# Patient Record
Sex: Female | Born: 1980
Health system: Southern US, Community
[De-identification: ages and names within clinical notes are randomized; demographics above are authoritative.]

## PROBLEM LIST (undated history)

## (undated) DIAGNOSIS — A048 Other specified bacterial intestinal infections: Secondary | ICD-10-CM

## (undated) DIAGNOSIS — Z915 Personal history of self-harm: Secondary | ICD-10-CM

## (undated) DIAGNOSIS — Z8719 Personal history of other diseases of the digestive system: Secondary | ICD-10-CM

## (undated) DIAGNOSIS — R87810 Cervical high risk human papillomavirus (HPV) DNA test positive: Secondary | ICD-10-CM

## (undated) DIAGNOSIS — K2950 Unspecified chronic gastritis without bleeding: Secondary | ICD-10-CM

## (undated) DIAGNOSIS — G4726 Circadian rhythm sleep disorder, shift work type: Secondary | ICD-10-CM

## (undated) DIAGNOSIS — D649 Anemia, unspecified: Secondary | ICD-10-CM

## (undated) DIAGNOSIS — F32A Depression, unspecified: Secondary | ICD-10-CM

## (undated) DIAGNOSIS — Z87442 Personal history of urinary calculi: Secondary | ICD-10-CM

## (undated) DIAGNOSIS — D6859 Other primary thrombophilia: Secondary | ICD-10-CM

## (undated) DIAGNOSIS — F419 Anxiety disorder, unspecified: Secondary | ICD-10-CM

## (undated) DIAGNOSIS — Z8041 Family history of malignant neoplasm of ovary: Secondary | ICD-10-CM

## (undated) DIAGNOSIS — R21 Rash and other nonspecific skin eruption: Secondary | ICD-10-CM

## (undated) DIAGNOSIS — R61 Generalized hyperhidrosis: Secondary | ICD-10-CM

## (undated) DIAGNOSIS — I82409 Acute embolism and thrombosis of unspecified deep veins of unspecified lower extremity: Secondary | ICD-10-CM

## (undated) DIAGNOSIS — G43909 Migraine, unspecified, not intractable, without status migrainosus: Secondary | ICD-10-CM

## (undated) DIAGNOSIS — F329 Major depressive disorder, single episode, unspecified: Secondary | ICD-10-CM

## (undated) DIAGNOSIS — M1990 Unspecified osteoarthritis, unspecified site: Secondary | ICD-10-CM

## (undated) DIAGNOSIS — K219 Gastro-esophageal reflux disease without esophagitis: Secondary | ICD-10-CM

## (undated) DIAGNOSIS — Z9151 Personal history of suicidal behavior: Secondary | ICD-10-CM

## (undated) HISTORY — DX: Migraine, unspecified, not intractable, without status migrainosus: G43.909

## (undated) HISTORY — PX: ABDOMINAL HYSTERECTOMY: SHX81

## (undated) HISTORY — DX: Family history of malignant neoplasm of ovary: Z80.41

## (undated) HISTORY — PX: OTHER SURGICAL HISTORY: SHX169

## (undated) HISTORY — DX: Generalized hyperhidrosis: R61

## (undated) HISTORY — DX: Major depressive disorder, single episode, unspecified: F32.9

## (undated) HISTORY — DX: Depression, unspecified: F32.A

## (undated) HISTORY — PX: TUBAL LIGATION: SHX77

## (undated) HISTORY — DX: Unspecified chronic gastritis without bleeding: K29.50

## (undated) HISTORY — DX: Personal history of other diseases of the digestive system: Z87.19

## (undated) HISTORY — DX: Anxiety disorder, unspecified: F41.9

## (undated) HISTORY — DX: Other specified bacterial intestinal infections: A04.8

## (undated) HISTORY — PX: CHOLECYSTECTOMY: SHX55

## (undated) HISTORY — DX: Circadian rhythm sleep disorder, shift work type: G47.26

## (undated) HISTORY — DX: Cervical high risk human papillomavirus (HPV) DNA test positive: R87.810

## (undated) HISTORY — DX: Personal history of self-harm: Z91.5

## (undated) HISTORY — DX: Rash and other nonspecific skin eruption: R21

## (undated) HISTORY — DX: Unspecified osteoarthritis, unspecified site: M19.90

## (undated) HISTORY — DX: Personal history of suicidal behavior: Z91.51

## (undated) HISTORY — PX: ANTERIOR CRUCIATE LIGAMENT REPAIR: SHX115

---
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
## 1999-03-16 ENCOUNTER — Encounter: Payer: Self-pay | Admitting: Emergency Medicine

## 1999-03-16 ENCOUNTER — Emergency Department (HOSPITAL_COMMUNITY): Admission: EM | Admit: 1999-03-16 | Discharge: 1999-03-16 | Payer: Self-pay | Admitting: Emergency Medicine

## 1999-05-21 ENCOUNTER — Encounter: Payer: Self-pay | Admitting: Emergency Medicine

## 1999-05-21 ENCOUNTER — Emergency Department (HOSPITAL_COMMUNITY): Admission: EM | Admit: 1999-05-21 | Discharge: 1999-05-21 | Payer: Self-pay | Admitting: Emergency Medicine

## 1999-06-02 ENCOUNTER — Ambulatory Visit (HOSPITAL_COMMUNITY): Admission: RE | Admit: 1999-06-02 | Discharge: 1999-06-02 | Payer: Self-pay

## 2000-01-08 ENCOUNTER — Encounter: Payer: Self-pay | Admitting: Emergency Medicine

## 2000-01-08 ENCOUNTER — Emergency Department (HOSPITAL_COMMUNITY): Admission: EM | Admit: 2000-01-08 | Discharge: 2000-01-08 | Payer: Self-pay | Admitting: Emergency Medicine

## 2000-06-27 ENCOUNTER — Emergency Department (HOSPITAL_COMMUNITY): Admission: EM | Admit: 2000-06-27 | Discharge: 2000-06-27 | Payer: Self-pay | Admitting: Emergency Medicine

## 2000-07-24 ENCOUNTER — Encounter: Admission: RE | Admit: 2000-07-24 | Discharge: 2000-07-24 | Payer: Self-pay | Admitting: Obstetrics & Gynecology

## 2000-07-30 ENCOUNTER — Encounter: Payer: Self-pay | Admitting: Emergency Medicine

## 2000-07-30 ENCOUNTER — Emergency Department (HOSPITAL_COMMUNITY): Admission: EM | Admit: 2000-07-30 | Discharge: 2000-07-30 | Payer: Self-pay | Admitting: Emergency Medicine

## 2000-08-02 ENCOUNTER — Encounter: Admission: RE | Admit: 2000-08-02 | Discharge: 2000-08-02 | Payer: Self-pay | Admitting: Obstetrics

## 2000-08-23 ENCOUNTER — Encounter: Admission: RE | Admit: 2000-08-23 | Discharge: 2000-08-23 | Payer: Self-pay | Admitting: Obstetrics

## 2000-09-06 ENCOUNTER — Encounter: Admission: RE | Admit: 2000-09-06 | Discharge: 2000-09-06 | Payer: Self-pay | Admitting: Obstetrics

## 2000-09-20 ENCOUNTER — Encounter: Admission: RE | Admit: 2000-09-20 | Discharge: 2000-09-20 | Payer: Self-pay | Admitting: Obstetrics

## 2000-10-04 ENCOUNTER — Encounter: Admission: RE | Admit: 2000-10-04 | Discharge: 2000-10-04 | Payer: Self-pay | Admitting: Obstetrics

## 2000-10-18 ENCOUNTER — Encounter: Admission: RE | Admit: 2000-10-18 | Discharge: 2000-10-18 | Payer: Self-pay | Admitting: Obstetrics

## 2000-12-08 ENCOUNTER — Emergency Department (HOSPITAL_COMMUNITY): Admission: EM | Admit: 2000-12-08 | Discharge: 2000-12-08 | Payer: Self-pay | Admitting: Emergency Medicine

## 2000-12-08 ENCOUNTER — Encounter: Payer: Self-pay | Admitting: Emergency Medicine

## 2001-07-22 ENCOUNTER — Emergency Department (HOSPITAL_COMMUNITY): Admission: EM | Admit: 2001-07-22 | Discharge: 2001-07-22 | Payer: Self-pay

## 2001-11-21 ENCOUNTER — Encounter: Payer: Self-pay | Admitting: Emergency Medicine

## 2001-11-21 ENCOUNTER — Emergency Department (HOSPITAL_COMMUNITY): Admission: EM | Admit: 2001-11-21 | Discharge: 2001-11-21 | Payer: Self-pay | Admitting: Emergency Medicine

## 2001-12-17 ENCOUNTER — Other Ambulatory Visit: Admission: RE | Admit: 2001-12-17 | Discharge: 2001-12-17 | Payer: Self-pay | Admitting: Obstetrics & Gynecology

## 2002-01-10 ENCOUNTER — Emergency Department (HOSPITAL_COMMUNITY): Admission: EM | Admit: 2002-01-10 | Discharge: 2002-01-10 | Payer: Self-pay | Admitting: Emergency Medicine

## 2002-02-17 ENCOUNTER — Emergency Department (HOSPITAL_COMMUNITY): Admission: EM | Admit: 2002-02-17 | Discharge: 2002-02-17 | Payer: Self-pay | Admitting: *Deleted

## 2002-03-26 ENCOUNTER — Ambulatory Visit (HOSPITAL_COMMUNITY): Admission: RE | Admit: 2002-03-26 | Discharge: 2002-03-26 | Payer: Self-pay | Admitting: Obstetrics & Gynecology

## 2002-03-26 ENCOUNTER — Encounter: Payer: Self-pay | Admitting: Obstetrics & Gynecology

## 2002-05-05 ENCOUNTER — Inpatient Hospital Stay (HOSPITAL_COMMUNITY): Admission: AD | Admit: 2002-05-05 | Discharge: 2002-05-08 | Payer: Self-pay | Admitting: Obstetrics & Gynecology

## 2002-05-06 ENCOUNTER — Encounter: Payer: Self-pay | Admitting: Obstetrics & Gynecology

## 2002-05-07 ENCOUNTER — Encounter: Payer: Self-pay | Admitting: Obstetrics & Gynecology

## 2002-07-21 ENCOUNTER — Ambulatory Visit (HOSPITAL_COMMUNITY): Admission: RE | Admit: 2002-07-21 | Discharge: 2002-07-21 | Payer: Self-pay | Admitting: Obstetrics

## 2002-07-21 ENCOUNTER — Encounter: Payer: Self-pay | Admitting: Obstetrics

## 2002-08-21 ENCOUNTER — Ambulatory Visit (HOSPITAL_COMMUNITY): Admission: RE | Admit: 2002-08-21 | Discharge: 2002-08-21 | Payer: Self-pay | Admitting: Obstetrics

## 2002-08-21 ENCOUNTER — Encounter: Payer: Self-pay | Admitting: Obstetrics

## 2002-08-23 ENCOUNTER — Inpatient Hospital Stay (HOSPITAL_COMMUNITY): Admission: AD | Admit: 2002-08-23 | Discharge: 2002-08-23 | Payer: Self-pay | Admitting: Obstetrics

## 2002-08-25 ENCOUNTER — Inpatient Hospital Stay (HOSPITAL_COMMUNITY): Admission: AD | Admit: 2002-08-25 | Discharge: 2002-08-27 | Payer: Self-pay | Admitting: Obstetrics

## 2003-04-06 ENCOUNTER — Inpatient Hospital Stay (HOSPITAL_COMMUNITY): Admission: EM | Admit: 2003-04-06 | Discharge: 2003-04-08 | Payer: Self-pay | Admitting: Psychiatry

## 2003-07-24 ENCOUNTER — Inpatient Hospital Stay (HOSPITAL_COMMUNITY): Admission: AD | Admit: 2003-07-24 | Discharge: 2003-07-24 | Payer: Self-pay | Admitting: *Deleted

## 2004-08-05 ENCOUNTER — Encounter: Payer: Self-pay | Admitting: *Deleted

## 2004-08-05 ENCOUNTER — Emergency Department (HOSPITAL_COMMUNITY): Admission: EM | Admit: 2004-08-05 | Discharge: 2004-08-06 | Payer: Self-pay | Admitting: Emergency Medicine

## 2004-08-09 ENCOUNTER — Emergency Department (HOSPITAL_COMMUNITY): Admission: EM | Admit: 2004-08-09 | Discharge: 2004-08-09 | Payer: Self-pay | Admitting: Family Medicine

## 2004-08-12 ENCOUNTER — Emergency Department (HOSPITAL_COMMUNITY): Admission: EM | Admit: 2004-08-12 | Discharge: 2004-08-12 | Payer: Self-pay | Admitting: Family Medicine

## 2004-09-08 ENCOUNTER — Inpatient Hospital Stay (HOSPITAL_COMMUNITY): Admission: AD | Admit: 2004-09-08 | Discharge: 2004-09-08 | Payer: Self-pay | Admitting: Obstetrics & Gynecology

## 2006-07-12 ENCOUNTER — Emergency Department (HOSPITAL_COMMUNITY): Admission: EM | Admit: 2006-07-12 | Discharge: 2006-07-12 | Payer: Self-pay | Admitting: Emergency Medicine

## 2006-10-19 ENCOUNTER — Ambulatory Visit (HOSPITAL_COMMUNITY): Admission: RE | Admit: 2006-10-19 | Discharge: 2006-10-19 | Payer: Self-pay | Admitting: Obstetrics

## 2006-11-09 ENCOUNTER — Inpatient Hospital Stay (HOSPITAL_COMMUNITY): Admission: AD | Admit: 2006-11-09 | Discharge: 2006-11-09 | Payer: Self-pay | Admitting: Obstetrics & Gynecology

## 2007-01-30 ENCOUNTER — Ambulatory Visit (HOSPITAL_COMMUNITY): Admission: RE | Admit: 2007-01-30 | Discharge: 2007-01-30 | Payer: Self-pay | Admitting: Obstetrics

## 2007-02-12 ENCOUNTER — Ambulatory Visit (HOSPITAL_COMMUNITY): Admission: RE | Admit: 2007-02-12 | Discharge: 2007-02-12 | Payer: Self-pay | Admitting: Obstetrics

## 2007-04-09 ENCOUNTER — Inpatient Hospital Stay (HOSPITAL_COMMUNITY): Admission: AD | Admit: 2007-04-09 | Discharge: 2007-04-09 | Payer: Self-pay | Admitting: Obstetrics

## 2007-06-28 ENCOUNTER — Inpatient Hospital Stay (HOSPITAL_COMMUNITY): Admission: AD | Admit: 2007-06-28 | Discharge: 2007-06-28 | Payer: Self-pay | Admitting: Obstetrics

## 2007-07-01 ENCOUNTER — Inpatient Hospital Stay (HOSPITAL_COMMUNITY): Admission: RE | Admit: 2007-07-01 | Discharge: 2007-07-04 | Payer: Self-pay | Admitting: Obstetrics

## 2007-07-02 ENCOUNTER — Encounter: Payer: Self-pay | Admitting: Obstetrics

## 2010-05-25 ENCOUNTER — Emergency Department (HOSPITAL_COMMUNITY)
Admission: EM | Admit: 2010-05-25 | Discharge: 2010-05-25 | Payer: Self-pay | Source: Home / Self Care | Admitting: Family Medicine

## 2010-09-27 NOTE — Op Note (Signed)
NAMEHISAKO, Tammy Garza               ACCOUNT NO.:  [id number]   MEDICAL RECORD NO.:  [id number]          PATIENT TYPE:  INP   LOCATION:  9144                          FACILITY:  [facility]   PHYSICIAN:  Charles A. Clearance Coots, M.D.DATE OF BIRTH:  08-Mar-1981   DATE OF PROCEDURE:  07/02/2007  DATE OF DISCHARGE:                               OPERATIVE REPORT   PREOPERATIVE DIAGNOSIS:  Elective sterilization.   POSTOPERATIVE DIAGNOSIS:  Elective sterilization.   PROCEDURE:  Bilateral partial salpingectomy.   SURGEON:  Brock Bad, MD   ANESTHESIA:  Epidural.   ESTIMATED BLOOD LOSS:  Negligible.   COMPLICATIONS:  None.   SPECIMEN:  Approximately 2 cm segments of right and left fallopian  tubes.   DISPOSITION OF SPECIMEN:  Pathology.   PROCEDURE IN DETAIL:  The patient was brought to the operating room and  after satisfactory redosing of the epidural the abdomen was prepped and  draped in the usual sterile fashion.  A small inferior umbilical  incision was made with a scalpel that was deepened down to the fascia  with curved Mayo scissors bluntly.  The fascia was grasped in the  midline with Kocher forceps and was cut transversely with curved Mayo  scissors.  The fascial incision was extended to the left and to the  right with the curved Mayo scissors.  The peritoneum was bluntly entered  with hemostats.  Right-angle retractors were placed in the incision.  The right fallopian tube was identified and was grasped with a Babcock  clamp.  The tube was followed from the cornual end to the fimbrial end  serially and grasped with a Babcock clamp.  The tube was then regrasped  in the isthmic area of the tube with the Babcock clamp and a knuckle of  tube beneath the Babcock clamp was suture ligated through the  mesosalpinx with zero plain catgut.  The section of tube above the knot  was excised with Metzenbaum scissors and submitted to pathology for  evaluation.  There was no active bleeding  from the tubal stumps and it  was therefore placed back in the normal anatomic position.  The same  procedure was performed on the opposite side without complications.  The  abdomen was then closed as follows.  The fascia and peritoneum was  closed as one with a continuous suture of 2-0 Vicryl.  The skin was closed with continuous subcuticular suture of 3-0 Monocryl.  Sterile bandage was applied to the incision closure.  Surgical  technician indicated that all needle, sponge and instrument counts were  correct x2.  The patient tolerated the procedure well, was transported  to the recovery room in satisfactory condition.      Charles A. Clearance Coots, M.D.  Electronically Signed     CAH/MEDQ  D:  07/02/2007  T:  07/03/2007  Job:  [id number]

## 2010-09-30 NOTE — Discharge Summary (Signed)
Tammy Garza, AN                           ACCOUNT NO.:  [id number]   MEDICAL RECORD NO.:  [id number]                    PATIENT TYPE:   LOCATION:                                       FACILITY:   PHYSICIAN:  Charles A. Clearance Coots, M.D.             DATE OF BIRTH:   DATE OF ADMISSION:  DATE OF DISCHARGE:                                 DISCHARGE SUMMARY   ADMISSION DIAGNOSES:  1. Second trimester pregnancy with flank pain and hematuria.  2. Probable kidney stone.   DISCHARGE DIAGNOSES:  1. Second trimester pregnancy with flank pain and hematuria.  2. Probable kidney stone.  3. Improved after IV hydration and supportive care.   REASON FOR ADMISSION:  The patient is a 30 year old black female with EDC of  August 21, 2001 who presented with a two day history of right flank pain and  blood in the urine. She presented to triage with both symptoms. She denied  fever, chills, cramping, nausea, vomiting, diarrhea, constipation. Prenatal  care had been without complications thus far.   PAST MEDICAL HISTORY:  Please see chart for full historical data.   PHYSICAL EXAMINATION:  GENERAL: A well developed, well nourished  black  female in no acute distress.  VITAL SIGNS: Afebrile. Vitals stable.  HEENT: Examination within normal limits.  LUNGS: Clear to auscultation and percussion bilaterally.  HEART: Regular rate and rhythm.  No murmur, rub, or gallop.  ABDOMEN: Soft, non-tender.  BACK: Positive right cardiovascular tenderness.  PELVIC: Pelvis was fingertip and long.   LABORATORY DATA:  UA revealed remarkable findings of calcium oxylate  crystals, blood, and hemoglobin in the urine.   DIAGNOSTIC IMPRESSION:  External fetal monitoring revealed mild variable  decelerations with uterine irritability.   HOSPITAL COURSE:  The patient was admitted and started on IV hydration,  analgesic therapy, and IV Ampicillin. Renal ultrasound was done on hospital  day two and revealed no urolithiasis  nor nephrolithiasis. The patient  continued to do well on IV hydration, analgesic therapy, and IV antibiotic  therapy and by hospital day two, was having less pain and by hospital day  three, was asymptomatic.   DISPOSITION:  She was therefore discharged to home on hospital day three at  24 weeks approximate gestation, much improved, and undelivered.   LABORATORY DATA:  On discharge WBC count was 9,400.   DISCHARGE MEDICATIONS:  1. Continue prenatal vitamins.  2. Hemocyte Plus one tab by mouth daily for anemia.   SPECIAL INSTRUCTIONS:  Routine instructions by booklet were given for OB  instructions.   FOLLOW UP:  The patient is to call the office at [facility] for  follow-up appointment.  Charles A. Clearance Coots, M.D.    CAH/MEDQ  D:  05/08/2002  T:  05/09/2002  Job:  [id number]
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
## 2011-02-03 LAB — CBC
HCT: 35 — ABNORMAL LOW
HCT: 35.4 — ABNORMAL LOW
HCT: 38.1
Hemoglobin: 12.1
Hemoglobin: 12.1
Hemoglobin: 12.9
MCHC: 34
MCHC: 34
MCHC: 34.6
MCV: 93.5
MCV: 94.4
MCV: 94.8
Platelets: 139 — ABNORMAL LOW
Platelets: 153
Platelets: 157
RBC: 3.74 — ABNORMAL LOW
RBC: 3.74 — ABNORMAL LOW
RBC: 4.03
RDW: 13.1
RDW: 13.2
RDW: 13.4
WBC: 11.7 — ABNORMAL HIGH
WBC: 6.8
WBC: 8.8

## 2011-02-03 LAB — RPR: RPR Ser Ql: NONREACTIVE

## 2011-02-03 LAB — DIFFERENTIAL
Basophils Absolute: 0
Basophils Relative: 0
Eosinophils Absolute: 0.1
Eosinophils Relative: 1
Lymphocytes Relative: 3 — ABNORMAL LOW
Lymphs Abs: 0.3 — ABNORMAL LOW
Monocytes Absolute: 0 — ABNORMAL LOW
Monocytes Relative: 0 — ABNORMAL LOW
Neutro Abs: 8.4 — ABNORMAL HIGH
Neutrophils Relative %: 96 — ABNORMAL HIGH

## 2011-02-03 LAB — URINALYSIS, ROUTINE W REFLEX MICROSCOPIC
Bilirubin Urine: NEGATIVE
Glucose, UA: NEGATIVE
Hgb urine dipstick: NEGATIVE
Ketones, ur: NEGATIVE
Nitrite: NEGATIVE
Protein, ur: NEGATIVE
Specific Gravity, Urine: 1.01
Urobilinogen, UA: 0.2
pH: 5.5

## 2011-02-03 LAB — CULTURE, BLOOD (ROUTINE X 2): Culture: NO GROWTH

## 2011-02-03 LAB — URINE CULTURE
Colony Count: NO GROWTH
Culture: NO GROWTH
Special Requests: NEGATIVE

## 2011-02-21 LAB — STREP B DNA PROBE: Strep Group B Ag: POSITIVE

## 2011-02-21 LAB — URINE MICROSCOPIC-ADD ON

## 2011-02-21 LAB — URINALYSIS, ROUTINE W REFLEX MICROSCOPIC
Bilirubin Urine: NEGATIVE
Glucose, UA: NEGATIVE
Ketones, ur: NEGATIVE
Nitrite: NEGATIVE
Protein, ur: NEGATIVE
Specific Gravity, Urine: 1.015
Urobilinogen, UA: 1
pH: 6

## 2011-02-21 LAB — WET PREP, GENITAL
Trich, Wet Prep: NONE SEEN
Yeast Wet Prep HPF POC: NONE SEEN

## 2011-02-21 LAB — FETAL FIBRONECTIN: Fetal Fibronectin: NEGATIVE

## 2011-03-01 LAB — COMPREHENSIVE METABOLIC PANEL
ALT: 14
AST: 17
Albumin: 3.9
Alkaline Phosphatase: 39
BUN: 5 — ABNORMAL LOW
CO2: 26
Calcium: 9.1
Chloride: 107
Creatinine, Ser: 0.61
GFR calc Af Amer: >60
GFR calc non Af Amer: >60
Glucose, Bld: 91
Potassium: 4.2
Sodium: 137
Total Bilirubin: 1
Total Protein: 6.9

## 2011-03-01 LAB — URINALYSIS, ROUTINE W REFLEX MICROSCOPIC
Bilirubin Urine: NEGATIVE
Glucose, UA: NEGATIVE
Hgb urine dipstick: NEGATIVE
Ketones, ur: NEGATIVE
Nitrite: NEGATIVE
Protein, ur: NEGATIVE
Specific Gravity, Urine: >1.03 — ABNORMAL HIGH
Urobilinogen, UA: 1
pH: 6

## 2011-03-01 LAB — CBC
HCT: 34.1 — ABNORMAL LOW
Hemoglobin: 11.4 — ABNORMAL LOW
MCHC: 33.5
MCV: 90.1
Platelets: 235
RBC: 3.78 — ABNORMAL LOW
RDW: 11.9
WBC: 7.3

## 2011-03-01 LAB — WET PREP, GENITAL
Trich, Wet Prep: NONE SEEN
Yeast Wet Prep HPF POC: NONE SEEN

## 2011-03-01 LAB — GC/CHLAMYDIA PROBE AMP, GENITAL
Chlamydia, DNA Probe: NEGATIVE
GC Probe Amp, Genital: NEGATIVE

## 2011-03-01 LAB — HCG, QUANTITATIVE, PREGNANCY: hCG, Beta Chain, Quant, S: 2511 — ABNORMAL HIGH

## 2011-06-12 ENCOUNTER — Emergency Department (HOSPITAL_COMMUNITY)
Admission: EM | Admit: 2011-06-12 | Discharge: 2011-06-12 | Disposition: A | Discharge status: Home / Self Care | Payer: 59 | Source: Home / Self Care

## 2012-08-15 ENCOUNTER — Encounter (HOSPITAL_COMMUNITY): Payer: Self-pay | Admitting: *Deleted

## 2012-08-15 ENCOUNTER — Emergency Department (HOSPITAL_COMMUNITY)
Admission: EM | Admit: 2012-08-15 | Discharge: 2012-08-16 | Discharge status: Against Medical Advice | Payer: 59 | Attending: Emergency Medicine | Admitting: Emergency Medicine

## 2012-08-15 ENCOUNTER — Ambulatory Visit: Payer: 59

## 2012-08-15 ENCOUNTER — Ambulatory Visit (INDEPENDENT_AMBULATORY_CARE_PROVIDER_SITE_OTHER): Payer: 59 | Admitting: *Deleted

## 2012-08-15 VITALS — BP 115/82 | HR 108 | Temp 99.8°F | Resp 16 | Ht 66.0 in | Wt 158.0 lb

## 2012-08-15 DIAGNOSIS — Z87891 Personal history of nicotine dependence: Secondary | ICD-10-CM | POA: Insufficient documentation

## 2012-08-15 DIAGNOSIS — Z3202 Encounter for pregnancy test, result negative: Secondary | ICD-10-CM | POA: Insufficient documentation

## 2012-08-15 DIAGNOSIS — G43909 Migraine, unspecified, not intractable, without status migrainosus: Secondary | ICD-10-CM | POA: Insufficient documentation

## 2012-08-15 DIAGNOSIS — M533 Sacrococcygeal disorders, not elsewhere classified: Secondary | ICD-10-CM

## 2012-08-15 DIAGNOSIS — Z79899 Other long term (current) drug therapy: Secondary | ICD-10-CM | POA: Insufficient documentation

## 2012-08-15 DIAGNOSIS — R110 Nausea: Secondary | ICD-10-CM | POA: Insufficient documentation

## 2012-08-15 LAB — POCT PREGNANCY, URINE: Preg Test, Ur: NEGATIVE

## 2012-08-15 MED ORDER — HYDROCODONE-ACETAMINOPHEN 10-325 MG PO TABS: 1.0000 | ORAL_TABLET | Freq: Three times a day (TID) | ORAL | Status: DC | PRN

## 2012-08-15 MED ORDER — CYCLOBENZAPRINE HCL 10 MG PO TABS: 10.0000 mg | ORAL_TABLET | Freq: Three times a day (TID) | ORAL | Status: DC | PRN

## 2012-08-15 NOTE — Progress Notes (Signed)
Tammy Garza is a 32 y.o. female who presents to Urgent Care today with complaints of low back pain:  1.  Low back/tailbone pain:  The patient states pain started yesterday at about 5 PM. She describes pain in to region that is worse in the day progressed. She had trouble sleeping last night due to the pain. She works third shift  And therefore ended her shift at 8 AM this morning. She tried sleeping on her stomach but the pain in her buttocks is still very comfortable. She has tried over-the-counter ibuprofen without relief. She has had no trauma to the area. She has not noticed any bruising in the near. No rash. No fevers or chills.  No bladder/bowel incontinence, no saddle anesthesia. Pain non-radiating to buttocks or legs. No paresthesias noted BL LE's, no weakness.   She recently switched (last week) to a job her she is sitting in a forklift all day. Previously she had been standing all day at work.  PMH reviewed.  Past Medical History  Diagnosis Date  . Migraines    Past Surgical History  Procedure Laterality Date  . Tubal ligation      Medications reviewed. Current Outpatient Prescriptions  Medication Sig Dispense Refill  . zonisamide (ZONEGRAN) 50 MG capsule Take 150 mg by mouth daily.       No current facility-administered medications for this visit.    ROS as above otherwise neg.  No chest pain, palpitations, SOB, Fever, Chills, Abd pain, N/V/D.   Physical Exam:  BP 115/82  Pulse 108  Temp(Src) 99.8 F (37.7 C)  Resp 16  Ht 5\' 6"  (1.676 m)  Wt 158 lb (71.668 kg)  BMI 25.51 kg/m2  LMP 07/30/2012 Gen:  Alert, cooperative patient who appears stated age who is uncomfortable-appearing secondary to pain. She stands during the majority of our conversation. She is in no acute distress HEENT: EOMI,  MMM Back:  Normal skin, Spine with normal alignment and no deformity.  No tenderness to vertebral process palpation.  Paraspinous muscles are not tender and without spasm.   Range  of motion is full at neck andMinimally decreased although she does been forward slowly in anticipation of pain in her lumbar sacral regions.  Straight leg raise is negative for pain.  She does have pain directly over the distal end of her coccyx. This is the point of maximal tenderness. Skin: There is no bruising, erythema, edema or sinus noted in the pilonidal area. No erythema, bruising noted her clear her coccyx. Skin is essentially normal. No other bruising noted throughout. Neuro:  Sensation and motor function 5/5 bilateral lower extremities.  Patellar and Achilles  DTR's +2 patellar BL  Full exam performed with nurse chaperone at all times  [facility] reading (PRIMARY) by  Dr. Gwendolyn Grant:  Slight anterior angulation of coccyx but otherwise no acute findings consistent with coccygeal pain.   Assessment and Plan:  1.  Coccyodynia: - Unclear etiology of pain.  Most likely secondary to switch to job with prolonged sitting -No evidence of pilonidal cyst. - No fevers or chills. No red flags or concerning symptoms. - Treat with Norco for pain relief.  Can try Flexeril for relief as well.   - Come back in 1 week for recheck.to ensure for improvement

## 2012-08-15 NOTE — ED Notes (Addendum)
Patient does not endorse urinary Sx. No burning or discomfort. States pain located deep (sacrum)

## 2012-08-15 NOTE — ED Notes (Addendum)
C/o tail bone pain. Onset: 08/14/12. When getting up from dinner table, pain started. Went to [facility]: they did x-ray and wnl. ? Beginning of uti. No urine culture done. Hx. Of kidney stones. Were prescribed vicodin and did not take any b/c feeling faint.  Also, taking muscle relaxer's.

## 2012-08-15 NOTE — ED Provider Notes (Signed)
MSE was initiated and I personally evaluated the patient and placed orders (if any) at  10:17 PM on August 15, 2012. Patient was triaged at a level four for tailbone pain. Upon investigation through history it became clear patient was here for syncopal episode. Charge nurse was contacted to move patient to a higher acuity. Patient transferred out of fast track.   The patient appears stable so that the remainder of the MSE may be completed by another provider.   Eulas Schweitzer PA-C  Jeannetta Ellis, PA-C 08/15/12 2218

## 2012-08-15 NOTE — Patient Instructions (Signed)
I don't have a good reason for your back pain.    I have sent in 2 different medicines for you.    Don't drive with these.  Use heat or ice, whichever helps more.    Use a donut seat to help with sitting.   Come back and see [place] in 5 - 7 days, so we can see how you're doing.

## 2012-08-15 NOTE — ED Provider Notes (Signed)
Medical screening examination/treatment/procedure(s) were performed by non-physician practitioner and as supervising physician I was immediately available for consultation/collaboration.   Loren Racer, MD 08/15/12 2337

## 2012-08-16 NOTE — ED Notes (Signed)
The patient elected to leave against medical advice.  The was AOx4 and aware of her risk factors for leaving prior to completing her treatment when she signed out.

## 2012-08-16 NOTE — ED Provider Notes (Signed)
Medical screening examination/treatment/procedure(s) were performed by non-physician practitioner and as supervising physician I was immediately available for consultation/collaboration.   Joya Gaskins, MD 08/16/12 [phone]

## 2012-08-16 NOTE — ED Provider Notes (Signed)
History     CSN: [id number]  Arrival date & time 08/15/12  2131   First MD Initiated Contact with Patient 08/15/12 2152      Chief Complaint  Patient presents with  . Tailbone Pain    (Consider location/radiation/quality/duration/timing/severity/associated sxs/prior treatment) The history is provided by the patient and the spouse. No language interpreter was used.  Pt arrived in FT earlier tonight for tailbone pain.  Pt's husband described pt being unable to take pain medication prescribed to her yesterday at urgent care for same problem due to pt passing out because of the pain.  Pt states her pain started 3 days ago out of nowhere. She is unable to describe pain besides stating it hurts when sitting or when lying flat on her tailbone.  Xrays were done at urgent care.  No fx was noted. Pt denies trauma or significant PMH.  No cause of pain was able to be determined by urgent care.  Pt was prescribed pain medication and advised to f/u in 1wk if pain did not improve.  Past Medical History  Diagnosis Date  . Migraines     Past Surgical History  Procedure Laterality Date  . Tubal ligation      Family History  Problem Relation Age of Onset  . HIV Sister   . Asthma Daughter   . Hypertension Maternal Grandmother     History  Substance Use Topics  . Smoking status: Former Games developer  . Smokeless tobacco: Not on file  . Alcohol Use: Yes    OB History   Grav Para Term Preterm Abortions TAB SAB Ect Mult Living                  Review of Systems  Constitutional: Negative for fever and chills.  Respiratory: Negative for shortness of breath.   Cardiovascular: Negative for chest pain.  Gastrointestinal: Positive for nausea. Negative for vomiting, diarrhea, constipation, anal bleeding and rectal pain.  Genitourinary: Negative for urgency, vaginal bleeding, vaginal discharge and pelvic pain.  Musculoskeletal: Positive for back pain. Negative for gait problem.    Allergies  Review  of patient's allergies indicates no known allergies.  Home Medications   Current Outpatient Rx  Name  Route  Sig  Dispense  Refill  . cyclobenzaprine (FLEXERIL) 10 MG tablet   Oral   Take 1 tablet (10 mg total) by mouth 3 (three) times daily as needed for muscle spasms.   30 tablet   0   . HYDROcodone-acetaminophen (NORCO) 10-325 MG per tablet   Oral   Take 1 tablet by mouth every 8 (eight) hours as needed for pain.   30 tablet   0   . zonisamide (ZONEGRAN) 50 MG capsule   Oral   Take 150 mg by mouth daily.           BP 104/71  Pulse 82  Temp(Src) 97.8 F (36.6 C) (Oral)  Resp 16  SpO2 95%  LMP 07/30/2012  Physical Exam  Nursing note and vitals reviewed. Constitutional: She appears well-developed and well-nourished. No distress.  HENT:  Head: Normocephalic and atraumatic.  Right Ear: External ear normal.  Eyes: Conjunctivae are normal. No scleral icterus.  Neck: Normal range of motion. Neck supple.  Cardiovascular: Normal rate, regular rhythm and normal heart sounds.   Pulmonary/Chest: Effort normal and breath sounds normal. No respiratory distress. She has no wheezes.  Abdominal: Soft. Bowel sounds are normal. She exhibits no distension. There is no tenderness.  Musculoskeletal: Normal range of  motion. She exhibits tenderness ( TTP over coccyx ).  Neurological: She is alert.  Skin: Skin is warm and dry. She is not diaphoretic.    ED Course  Procedures (including critical care time)  Labs Reviewed  POCT PREGNANCY, URINE   Dg Sacrum/coccyx  08/15/2012  *RADIOLOGY REPORT*  Clinical Data: Coccygeal pain.  No known injury.  SACRUM AND COCCYX - 2+ VIEW  Comparison:  None.  Findings:  There is no evidence of fracture or other focal bone lesions. Coccygeal alignment is within limits for normal variation.  IMPRESSION: Negative.   Original Report Authenticated By: Davonna Belling, M.D.     Date: 08/16/2012  Rate: 81  Rhythm: normal sinus rhythm  QRS Axis: normal   Intervals: normal  ST/T Wave abnormalities: normal  Conduction Disutrbances:none  Narrative Interpretation:   Old EKG Reviewed: none available    1. Coccyx pain       MDM  Pt had been scene by urgent care yesterday for tailbone pain, denies trauma.  States she had Xrays done.  No cause for pt's pain was able to be determined.  Pt was sent home with norco and flexeril and advised to f/u in 1wk if pain persisted.    Husband states she was unable to take the medication because she passed out due to the pain.  Pt has yet to have tried any pain medication.  States pain is worse today than yesterday.   Pt was initially in FT when it was determined she was here due to "passing out"   In the process of trying to consult with Dr. Bebe Shaggy as far as where to go from here, pt and husband decided to leave AMA because they had to go pick up their kids.  Pt did sign out with RN.         Junius Finner, PA-C 08/16/12 [phone]

## 2013-05-05 LAB — LIPID PANEL
Cholesterol: 216 mg/dL — AB (ref 0–200)
HDL: 66 mg/dL (ref 35–70)
LDL Cholesterol: 81 mg/dL
Triglycerides: 105 mg/dL (ref 40–160)

## 2013-05-13 ENCOUNTER — Ambulatory Visit: Payer: Self-pay | Admitting: Family Medicine

## 2013-11-30 ENCOUNTER — Emergency Department: Payer: Self-pay | Admitting: Emergency Medicine

## 2013-11-30 LAB — COMPREHENSIVE METABOLIC PANEL
Albumin: 3.6 g/dL (ref 3.4–5.0)
Alkaline Phosphatase: 56 U/L
Anion Gap: 8 (ref 7–16)
BUN: 8 mg/dL (ref 7–18)
Bilirubin,Total: 0.4 mg/dL (ref 0.2–1.0)
Calcium, Total: 8.5 mg/dL (ref 8.5–10.1)
Chloride: 108 mmol/L — ABNORMAL HIGH (ref 98–107)
Co2: 24 mmol/L (ref 21–32)
Creatinine: 0.68 mg/dL (ref 0.60–1.30)
EGFR (African American): >60
EGFR (Non-African Amer.): >60
Glucose: 114 mg/dL — ABNORMAL HIGH (ref 65–99)
Osmolality: 279 (ref 275–301)
Potassium: 4.4 mmol/L (ref 3.5–5.1)
SGOT(AST): 18 U/L (ref 15–37)
SGPT (ALT): 15 U/L (ref 12–78)
Sodium: 140 mmol/L (ref 136–145)
Total Protein: 7.7 g/dL (ref 6.4–8.2)

## 2013-11-30 LAB — CBC WITH DIFFERENTIAL/PLATELET
Basophil #: 0.1 10*3/uL (ref 0.0–0.1)
Basophil %: 1.2 %
Eosinophil #: 0.2 10*3/uL (ref 0.0–0.7)
Eosinophil %: 2.7 %
HCT: 37 % (ref 35.0–47.0)
HGB: 11.6 g/dL — ABNORMAL LOW (ref 12.0–16.0)
Lymphocyte #: 2.5 10*3/uL (ref 1.0–3.6)
Lymphocyte %: 26.9 %
MCH: 29.9 pg (ref 26.0–34.0)
MCHC: 31.3 g/dL — ABNORMAL LOW (ref 32.0–36.0)
MCV: 95 fL (ref 80–100)
Monocyte #: 0.9 x10 3/mm (ref 0.2–0.9)
Monocyte %: 10.2 %
Neutrophil #: 5.4 10*3/uL (ref 1.4–6.5)
Neutrophil %: 59 %
Platelet: 275 10*3/uL (ref 150–440)
RBC: 3.88 10*6/uL (ref 3.80–5.20)
RDW: 12.8 % (ref 11.5–14.5)
WBC: 9.2 10*3/uL (ref 3.6–11.0)

## 2013-11-30 LAB — URINALYSIS, COMPLETE
Bacteria: NONE SEEN
Bilirubin,UR: NEGATIVE
Glucose,UR: NEGATIVE mg/dL (ref 0–75)
Ketone: NEGATIVE
Nitrite: NEGATIVE
Ph: 6 (ref 4.5–8.0)
Protein: NEGATIVE
RBC,UR: 3 /HPF (ref 0–5)
Specific Gravity: 1.019 (ref 1.003–1.030)
Squamous Epithelial: 5
WBC UR: 11 /HPF (ref 0–5)

## 2013-11-30 LAB — LIPASE, BLOOD: Lipase: 139 U/L (ref 73–393)

## 2013-11-30 LAB — TROPONIN I: Troponin-I: <0.02 ng/mL

## 2014-01-05 ENCOUNTER — Ambulatory Visit: Payer: Self-pay | Admitting: Gastroenterology

## 2014-04-14 ENCOUNTER — Ambulatory Visit: Payer: Self-pay | Admitting: Urgent Care

## 2014-04-14 LAB — COMPREHENSIVE METABOLIC PANEL
Albumin: 4 g/dL (ref 3.4–5.0)
Alkaline Phosphatase: 53 U/L
Anion Gap: 5 — ABNORMAL LOW (ref 7–16)
BUN: 6 mg/dL — ABNORMAL LOW (ref 7–18)
Bilirubin,Total: 0.7 mg/dL (ref 0.2–1.0)
Calcium, Total: 8.6 mg/dL (ref 8.5–10.1)
Chloride: 104 mmol/L (ref 98–107)
Co2: 29 mmol/L (ref 21–32)
Creatinine: 0.79 mg/dL (ref 0.60–1.30)
EGFR (African American): >60
EGFR (Non-African Amer.): >60
Glucose: 77 mg/dL (ref 65–99)
Osmolality: 272 (ref 275–301)
Potassium: 4 mmol/L (ref 3.5–5.1)
SGOT(AST): 8 U/L — ABNORMAL LOW (ref 15–37)
SGPT (ALT): 16 U/L
Sodium: 138 mmol/L (ref 136–145)
Total Protein: 8.1 g/dL (ref 6.4–8.2)

## 2014-04-14 LAB — URINALYSIS, COMPLETE
Bilirubin,UR: NEGATIVE
Blood: NEGATIVE
Glucose,UR: NEGATIVE mg/dL (ref 0–75)
Ketone: NEGATIVE
Leukocyte Esterase: NEGATIVE
Nitrite: NEGATIVE
Ph: 5 (ref 4.5–8.0)
Protein: 30
RBC,UR: 2 /HPF (ref 0–5)
Specific Gravity: 1.026 (ref 1.003–1.030)
Squamous Epithelial: 1
WBC UR: 1 /HPF (ref 0–5)

## 2014-04-14 LAB — CBC WITH DIFFERENTIAL/PLATELET
Basophil #: 0.1 10*3/uL (ref 0.0–0.1)
Basophil %: 1.1 %
Eosinophil #: 0.2 10*3/uL (ref 0.0–0.7)
Eosinophil %: 2.8 %
HCT: 42.2 % (ref 35.0–47.0)
HGB: 13.4 g/dL (ref 12.0–16.0)
Lymphocyte #: 3.5 10*3/uL (ref 1.0–3.6)
Lymphocyte %: 47.9 %
MCH: 30.9 pg (ref 26.0–34.0)
MCHC: 31.9 g/dL — ABNORMAL LOW (ref 32.0–36.0)
MCV: 97 fL (ref 80–100)
Monocyte #: 0.7 x10 3/mm (ref 0.2–0.9)
Monocyte %: 9.8 %
Neutrophil #: 2.8 10*3/uL (ref 1.4–6.5)
Neutrophil %: 38.4 %
Platelet: 215 10*3/uL (ref 150–440)
RBC: 4.35 10*6/uL (ref 3.80–5.20)
RDW: 12.5 % (ref 11.5–14.5)
WBC: 7.3 10*3/uL (ref 3.6–11.0)

## 2014-04-14 LAB — LIPASE, BLOOD: Lipase: 99 U/L (ref 73–393)

## 2014-04-15 ENCOUNTER — Ambulatory Visit: Payer: Self-pay | Admitting: Urgent Care

## 2014-04-16 ENCOUNTER — Inpatient Hospital Stay: Payer: Self-pay | Admitting: Surgery

## 2014-04-16 LAB — CBC WITH DIFFERENTIAL/PLATELET
Basophil #: 0.1 10*3/uL (ref 0.0–0.1)
Basophil %: 0.7 %
Eosinophil #: 0 10*3/uL (ref 0.0–0.7)
Eosinophil %: 0 %
HCT: 37.2 % (ref 35.0–47.0)
HGB: 12.3 g/dL (ref 12.0–16.0)
Lymphocyte #: 1.2 10*3/uL (ref 1.0–3.6)
Lymphocyte %: 12.2 %
MCH: 31.6 pg (ref 26.0–34.0)
MCHC: 33 g/dL (ref 32.0–36.0)
MCV: 96 fL (ref 80–100)
Monocyte #: 0.3 x10 3/mm (ref 0.2–0.9)
Monocyte %: 3.3 %
Neutrophil #: 8 10*3/uL — ABNORMAL HIGH (ref 1.4–6.5)
Neutrophil %: 83.8 %
Platelet: 187 10*3/uL (ref 150–440)
RBC: 3.89 10*6/uL (ref 3.80–5.20)
RDW: 12.3 % (ref 11.5–14.5)
WBC: 9.5 10*3/uL (ref 3.6–11.0)

## 2014-04-16 LAB — COMPREHENSIVE METABOLIC PANEL
Albumin: 3.8 g/dL (ref 3.4–5.0)
Alkaline Phosphatase: 53 U/L
Anion Gap: 7 (ref 7–16)
BUN: 6 mg/dL — ABNORMAL LOW (ref 7–18)
Bilirubin,Total: 0.7 mg/dL (ref 0.2–1.0)
Calcium, Total: 8 mg/dL — ABNORMAL LOW (ref 8.5–10.1)
Chloride: 107 mmol/L (ref 98–107)
Co2: 25 mmol/L (ref 21–32)
Creatinine: 0.8 mg/dL (ref 0.60–1.30)
EGFR (African American): >60
EGFR (Non-African Amer.): >60
Glucose: 103 mg/dL — ABNORMAL HIGH (ref 65–99)
Osmolality: 275 (ref 275–301)
Potassium: 4.1 mmol/L (ref 3.5–5.1)
SGOT(AST): 18 U/L (ref 15–37)
SGPT (ALT): 16 U/L
Sodium: 139 mmol/L (ref 136–145)
Total Protein: 7.5 g/dL (ref 6.4–8.2)

## 2014-04-16 LAB — TROPONIN I: Troponin-I: <0.02 ng/mL

## 2014-04-16 LAB — HCG, QUANTITATIVE, PREGNANCY: Beta Hcg, Quant.: <1 m[IU]/mL — ABNORMAL LOW

## 2014-04-16 LAB — LIPASE, BLOOD: Lipase: 87 U/L (ref 73–393)

## 2014-04-17 LAB — CBC WITH DIFFERENTIAL/PLATELET
Basophil #: 0 10*3/uL (ref 0.0–0.1)
Basophil %: 0.5 %
Eosinophil #: 0.1 10*3/uL (ref 0.0–0.7)
Eosinophil %: 0.6 %
HCT: 35.8 % (ref 35.0–47.0)
HGB: 11.5 g/dL — ABNORMAL LOW (ref 12.0–16.0)
Lymphocyte #: 3.3 10*3/uL (ref 1.0–3.6)
Lymphocyte %: 31.6 %
MCH: 31.3 pg (ref 26.0–34.0)
MCHC: 32 g/dL (ref 32.0–36.0)
MCV: 98 fL (ref 80–100)
Monocyte #: 1.1 x10 3/mm — ABNORMAL HIGH (ref 0.2–0.9)
Monocyte %: 10.2 %
Neutrophil #: 6 10*3/uL (ref 1.4–6.5)
Neutrophil %: 57.1 %
Platelet: 165 10*3/uL (ref 150–440)
RBC: 3.67 10*6/uL — ABNORMAL LOW (ref 3.80–5.20)
RDW: 12.3 % (ref 11.5–14.5)
WBC: 10.5 10*3/uL (ref 3.6–11.0)

## 2014-04-17 LAB — URINALYSIS, COMPLETE
Bilirubin,UR: NEGATIVE
Blood: NEGATIVE
Glucose,UR: NEGATIVE mg/dL (ref 0–75)
Leukocyte Esterase: NEGATIVE
Nitrite: NEGATIVE
Ph: 7 (ref 4.5–8.0)
Protein: 30
RBC,UR: 2 /HPF (ref 0–5)
Specific Gravity: 1.024 (ref 1.003–1.030)
Squamous Epithelial: 7
WBC UR: 4 /HPF (ref 0–5)

## 2014-04-17 LAB — COMPREHENSIVE METABOLIC PANEL
Albumin: 3.1 g/dL — ABNORMAL LOW (ref 3.4–5.0)
Alkaline Phosphatase: 44 U/L — ABNORMAL LOW
Anion Gap: 7 (ref 7–16)
BUN: 4 mg/dL — ABNORMAL LOW (ref 7–18)
Bilirubin,Total: 0.9 mg/dL (ref 0.2–1.0)
Calcium, Total: 7.7 mg/dL — ABNORMAL LOW (ref 8.5–10.1)
Chloride: 107 mmol/L (ref 98–107)
Co2: 27 mmol/L (ref 21–32)
Creatinine: 0.79 mg/dL (ref 0.60–1.30)
EGFR (African American): >60
EGFR (Non-African Amer.): >60
Glucose: 81 mg/dL (ref 65–99)
Osmolality: 277 (ref 275–301)
Potassium: 4.4 mmol/L (ref 3.5–5.1)
SGOT(AST): 14 U/L — ABNORMAL LOW (ref 15–37)
SGPT (ALT): 13 U/L — ABNORMAL LOW
Sodium: 141 mmol/L (ref 136–145)
Total Protein: 6.4 g/dL (ref 6.4–8.2)

## 2014-07-24 LAB — HM PAP SMEAR: HM Pap smear: HIGH

## 2014-09-05 NOTE — Discharge Summary (Signed)
PATIENT NAME:  Tammy Garza, HACKENBERG MR#:  [id number] DATE OF BIRTH:  07/30/1980  DATE OF ADMISSION:  04/16/2014 DATE OF DISCHARGE:  04/18/2014  FINAL DIAGNOSIS: Acute calculus cholecystitis.   PRINCIPAL PROCEDURE: Laparoscopic cholecystectomy on 04/17/2014.   HOSPITAL COURSE SUMMARY: The patient was admitted late on the evening of December 3. She was brought to the operating room on December 4. Laparoscopic cholecystectomy was performed. Intraoperative findings demonstrated acute cholecystitis with a stone lodged in the gallbladder neck. She did well postoperatively and was deemed suitable for discharge later that evening.   FOLLOWUP INSTRUCTIONS: She is to call with any questions or concerns. Follow up in the office in 7 to 10 days.   DISCHARGE MEDICATIONS: Include omeprazole 20 mg by mouth delayed release capsule b.i.d., Norco 5/325 one tablet every 6 hours as needed for pain.   Call with any questions or concerns.   Discharge instructions were provided, as well as a medication reconciliation form.    ____________________________ Jeannette How. [name], MD mab:JT D: 04/19/2014 11:14:18 ET T: 04/19/2014 11:47:22 ET JOB#: [id number]  cc: Elta Guadel[place] A. [name], MD, <Dictator> Hortencia Conradi MD ELECTRONICALLY SIGNED 04/19/2014 18:48

## 2014-09-05 NOTE — H&P (Signed)
PATIENT NAME:  Tammy Garza, Tammy Garza MR#:  [id number] DATE OF BIRTH:  [birth year]  DATE OF ADMISSION:  04/16/2014  CHIEF COMPLAINT:  Epigastric and right upper quadrant pain.   HISTORY OF PRESENT ILLNESS:  This is a patient who was in the Emergency Room 2 days ago with epigastric right upper quadrant pain and workup showing gallstones, but no sign of acute cholecystitis with a negative ultrasonographic Murphy sign.   The patient returns today with unrelenting abdominal pain. She is more tender in the right upper quadrant. She has been having nausea and multiple emeses. No fevers or chills. She has had diarrhea. No melena or hematochezia. No jaundice or acholic stools. She states that she started having epigastric pain in June of this year and was treated for Helicobacter pylori but continues to have episodic pain occasionally associated with fatty food intolerance.   PAST MEDICAL HISTORY:  Helicobacter pylori and reflux disease.   PAST SURGICAL HISTORY:  Tubal ligation.   ALLERGIES:  None.   MEDICATIONS:  Omeprazole.   FAMILY HISTORY:  Noncontributory.   SOCIAL HISTORY:  The patient works as a Freight forwarder. She smokes tobacco. She does not drink alcohol.   REVIEW OF SYSTEMS:  Ten-system review is performed and negative with the exception of that mentioned in the HPI.   PHYSICAL EXAMINATION: VITAL SIGNS:  Stable. She is afebrile.  GENERAL:  She appears uncomfortable.  HEENT:  No scleral icterus.  NECK:  No palpable neck nodes.  CHEST:  Clear to auscultation.  CARDIAC:  Regular rate and rhythm.  ABDOMEN:  Soft, nondistended. There is tenderness in the epigastrium and right upper quadrant with a positive Murphy sign.  EXTREMITIES:  Without edema.  NEUROLOGIC:  Grossly intact.  INTEGUMENT:  No jaundice.   LABORATORY AND IMAGING DATA:  No labs are available yet today. Labs from 2 days ago were reviewed showing normal liver function tests, normal hemoglobin and hematocrit, and a normal white  blood cell count.   Ultrasound from 2 days ago demonstrated nonmobile stones without wall thickening and a negative sonographic Murphy sign.   ASSESSMENT AND PLAN:  This is a patient with probable early acute cholecystitis. Her pain is unrelenting. She had an ultrasound that only showed stones 2 days ago, but her pain has been unrelenting, suggestive of acute cholecystitis. I have recommended admission to the hospital, hydration, starting IV antibiotics, and performing a laparoscopic cholecystectomy in the near future during this hospitalization. The rationale for this was discussed with her and her significant other. The options of observation were reviewed. The risks of bleeding, infection, recurrence, negative laparoscopy, conversion to an open procedure, bile duct damage, bile duct leak, and retained common bile duct stone, any of which could require further surgery and/or ERCP, stent, and papillotomy were all reviewed. She understood and agreed to proceed.     ____________________________ Jerrol Banana Burt Knack, MD rec:nb D: 04/16/2014 23:30:35 ET T: 04/16/2014 23:42:57 ET JOB#: [id number]  cc: Jerrol Banana. Burt Knack, MD, <Dictator> Florene Glen MD ELECTRONICALLY SIGNED 04/17/2014 6:53

## 2014-09-05 NOTE — H&P (Signed)
   Subjective/Chief Complaint ruq pain   History of Present Illness 4 mos pain, H pylori treated onPPI worsening pain, in ED yesterday, U/s wirh stones mult emesis  unable to eat diasrrea, no f/c no jaundice   Past History PMH none PSH tubal lig   Past Med/Surgical Hx:  Migraines:   hpylori:   Gastritis:   denies:   ALLERGIES:  No Known Allergies:   Family and Social History:  Family History Non-Contributory   Social History positive  tobacco, negative ETOH, fork lift op   + Tobacco Current (within 1 year)   Review of Systems:  Fever/Chills No   Cough No   Abdominal Pain Yes   Diarrhea Yes   Constipation No   Nausea/Vomiting Yes   SOB/DOE No   Chest Pain No   Dysuria No   Tolerating Diet No  Nauseated  Vomiting   Physical Exam:  GEN uncomfortable   HEENT pink conjunctivae   NECK supple   RESP normal resp effort   CARD regular rate  no thrills   ABD positive tenderness  pos Murphy's sign   LYMPH negative neck   EXTR negative edema   SKIN normal to palpation   PSYCH alert, A+O to time, place, person   Radiology Results: [place]:    02-Dec-15 08:48, US Abdomen Limited Survey  US Abdomen Limited Survey  REASON FOR EXAM:    Upper Abd Pain Eval Gallbladder  COMMENTS:       PROCEDURE: KUS - KUS ABDOMEN LTD 1 ORGAN OR QUAD  - Apr 15 2014  8:48AM     CLINICAL DATA:  Abdominal pain.    EXAM:  US ABDOMEN LIMITED - RIGHT UPPER QUADRANT    COMPARISON:  None.    FINDINGS:  Gallbladder:  Multiple non mobile echogenic foci. This could represent tumefactive  adherent sludge, non shadowing stones, and/or polyps. Gallbladder  wall thickness 2.0 mm. Negative Murphy sign. No pericholecystic  fluid collection.    Common bile duct:    Diameter: 3.6 mm    Liver:    No focal lesion identified. Within normal limits in parenchymal  echogenicity.     IMPRESSION:  1. Gallbladder contains multiple non echogenic non mobile foci.  These could  represent tumefactive adherent sludge,non shadowing  stones, and/or polyps. No gallbladder wall thickening. No  pericholecystic fluid collection. Negative Murphy sign.  2. No other focal abnormality identified.      Electronically Signed    By: Marcello Moores  Register    On: 04/15/2014 09:26         Verified By: Osa Craver, M.D., MD    Assessment/Admission Diagnosis labs pending admit hydrate lap chole after hydrated risks options in detail   Electronic Signatures: Florene Glen (MD)  (Signed 03-Dec-15 22:42)  Authored: CHIEF COMPLAINT and HISTORY, PAST MEDICAL/SURGIAL HISTORY, ALLERGIES, FAMILY AND SOCIAL HISTORY, REVIEW OF SYSTEMS, PHYSICAL EXAM, Radiology, ASSESSMENT AND PLAN   Last Updated: 03-Dec-15 22:42 by Florene Glen (MD)

## 2014-09-05 NOTE — Op Note (Signed)
PATIENT NAME:  Tammy Garza, Tammy Garza MR#:  [id number] DATE OF BIRTH:  [birth year]  DATE OF PROCEDURE:  04/17/2014   PREOPERATIVE DIAGNOSIS: Acute calculus cholecystitis.   POSTOPERATIVE DIAGNOSIS:  Acute calculus cholecystitis.  PROCEDURE PERFORMED: Laparoscopic cholecystectomy.   SURGEON: Hortencia Conradi, MD    ASSISTANT: Surgical Scrub technologists x 2.   ANESTHESIA:  General endotracheal anesthesia.    FINDINGS: Acute calculus cholecystitis. Stone lodged in gallbladder neck.   SPECIMENS: Gallbladder with contents to pathology.   ESTIMATED BLOOD LOSS: 25 mL.   DRAINS: None. Lap and needle count correct x 2.   DESCRIPTION OF PROCEDURE: With informed consent, in supine position and general endotracheal anesthesia, the patient's abdomen was widely prepped and draped with ChloraPrep solution and left arm was padded and tucked at her side. Timeout was observed.   An infraumbilical transversely oriented skin incision was carried through old scar down to the abdominal midline fascia which was incised in the midline and elevated with Kocher clamps.   The peritoneum was entered sharply. 0-Vicryl U-stitch was placed on either side of the abdominal midline fascia securing a 12 mm blunt Hassan trocar placed under direct visualization.  Pneumoperitoneum was established.  The patient was then positioned in reverse Trendelenburg and the rotated right side up. Additional bladeless  5 mm trocars were placed in the epigastric and 2 in the right subcostal margin.   Dissection of the gallbladder was then performed with grasping of the fundus of the gallbladder elevating it over the right lobe of the liver. Perihepatic adhesions from the hepatic capsule to the diaphragm were taken down with sharp technique.  Lateral traction was achieved on Hartmann's pouch, and a combination of sharp dissection and blunt technique and hook cautery through both medial and lateral peritoneal layers was performed, demonstrating a critical  view of safety cholecystectomy thus being achieved.  The cystic duct was triply clipped on the portal side, singly clipped on the gallbladder side and then divided.  Immediately adjacent cystic artery was similarly divided. No aberrant bile duct or posterior cystic artery were further identified in the dissection of the gallbladder off the gallbladder fossa utilizing hook cautery apparatus and gentle traction.  The gallbladder was then placed into an Endo Catch device and retrieved.   Point hemostasis was obtained in the gallbladder fossa utilizing hook cautery apparatus, right upper quadrant being irrigated with 1 liter of normal saline and aspirated dry.  Hemostasis being ensured on the operative field.  Ports were then removed under direct visualization.   The infraumbilical fascial defect was closed with a figure-of-eight #0 Vicryl suture in a vertical orientation.  A total of 30 mL of 0.25% plain Marcaine was infiltrated along all skin and fascial incisions prior to closure.   4-0 Vicryl subcuticular stitches was applied to all skin edges, followed by benzoin, Steri-Strips, Telfa and Tegaderm.  The patient was subsequently extubated and taken to the recovery room in stable and satisfactory condition by anesthesia services.     ____________________________ [name], MD mab:DT D: 04/17/2014 15:36:25 ET T: 04/17/2014 16:07:19 ET JOB#: [id number]  cc: Elta Guadeloupe A. [name], MD, <Dictator> Bethena Roys. Ancil Boozer, MD  Hortencia Conradi MD ELECTRONICALLY SIGNED 04/19/2014 11:10

## 2014-09-07 LAB — SURGICAL PATHOLOGY

## 2015-04-22 ENCOUNTER — Encounter: Payer: Self-pay | Admitting: Family Medicine

## 2015-04-22 ENCOUNTER — Other Ambulatory Visit: Payer: Self-pay | Admitting: Family Medicine

## 2015-04-22 ENCOUNTER — Ambulatory Visit (INDEPENDENT_AMBULATORY_CARE_PROVIDER_SITE_OTHER): Payer: Medicaid Other | Admitting: Family Medicine

## 2015-04-22 VITALS — BP 102/58 | HR 73 | Temp 98.4°F | Resp 16 | Ht 66.0 in | Wt 174.9 lb

## 2015-04-22 DIAGNOSIS — N898 Other specified noninflammatory disorders of vagina: Secondary | ICD-10-CM

## 2015-04-22 DIAGNOSIS — R87810 Cervical high risk human papillomavirus (HPV) DNA test positive: Secondary | ICD-10-CM | POA: Insufficient documentation

## 2015-04-22 DIAGNOSIS — R3911 Hesitancy of micturition: Secondary | ICD-10-CM

## 2015-04-22 DIAGNOSIS — A5901 Trichomonal vulvovaginitis: Secondary | ICD-10-CM | POA: Diagnosis not present

## 2015-04-22 DIAGNOSIS — Z23 Encounter for immunization: Secondary | ICD-10-CM | POA: Diagnosis not present

## 2015-04-22 DIAGNOSIS — Z8619 Personal history of other infectious and parasitic diseases: Secondary | ICD-10-CM | POA: Insufficient documentation

## 2015-04-22 DIAGNOSIS — G43109 Migraine with aura, not intractable, without status migrainosus: Secondary | ICD-10-CM | POA: Insufficient documentation

## 2015-04-22 DIAGNOSIS — G4726 Circadian rhythm sleep disorder, shift work type: Secondary | ICD-10-CM | POA: Insufficient documentation

## 2015-04-22 DIAGNOSIS — Z113 Encounter for screening for infections with a predominantly sexual mode of transmission: Secondary | ICD-10-CM | POA: Diagnosis not present

## 2015-04-22 LAB — POCT URINALYSIS DIPSTICK
Bilirubin, UA: NEGATIVE
Glucose, UA: NEGATIVE
Ketones, UA: NEGATIVE
Leukocytes, UA: NEGATIVE
Nitrite, UA: NEGATIVE
Protein, UA: NEGATIVE
Spec Grav, UA: 1.02
Urobilinogen, UA: 0.2
pH, UA: 5.5

## 2015-04-22 LAB — POCT WET PREP (WET MOUNT)

## 2015-04-22 MED ORDER — METRONIDAZOLE 500 MG PO TABS: 2000.0000 mg | ORAL_TABLET | Freq: Once | ORAL | Status: DC

## 2015-04-22 MED ORDER — FLUCONAZOLE 150 MG PO TABS: 150.0000 mg | ORAL_TABLET | ORAL | Status: DC

## 2015-04-22 MED ORDER — ARMODAFINIL 150 MG PO TABS: 150.0000 mg | ORAL_TABLET | Freq: Every day | ORAL | Status: DC

## 2015-04-22 NOTE — Progress Notes (Signed)
Name: Tammy Garza   MRN: [id number]    DOB: [birth year]   Date:04/22/2015       Progress Note  Subjective  Chief Complaint  Chief Complaint  Patient presents with  . Vaginal Discharge    Patient states that she is having a white discharge and is more than normal. Other symptoms include taking longer to urinate.     HPI  Vaginal discharge: she has been dating the same person for about one year. Sexually active with him past 8 months. Developed some vaginal discharge, that is white in color, larger amount than usual. No odor, no itching, but has a vaginal discomfort. No fever, no pain during intercourse.   Hesitancy: she noticed some urinary hesitancy over the past month, no dysuria, no blood in urine, no flank pain.   SWSD: she started a new job at [employer] in [place], and works from 7 pm to 7 am. Able to sleep during the day, but feels very sleepy while at work.    Patient Active Problem List   Diagnosis Date Noted  . HPV test positive 04/22/2015  . History of Helicobacter pylori infection 04/22/2015  . Migraine with aura and without status migrainosus 04/22/2015  . Shift work sleep disorder 04/22/2015    Past Surgical History  Procedure Laterality Date  . Tubal ligation    . Cholecystectomy      Family History  Problem Relation Age of Onset  . HIV Sister   . Asthma Daughter   . Hypertension Maternal Grandmother   . Hypertension Mother     Social History   Social History  . Marital Status: Married    Spouse Name: N/A  . Number of Children: N/A  . Years of Education: N/A   Occupational History  . Not on file.   Social History Main Topics  . Smoking status: Former Research scientist (life sciences)  . Smokeless tobacco: Not on file  . Alcohol Use: Yes  . Drug Use: No  . Sexual Activity: Yes    Birth Control/ Protection: Other-see comments, Surgical     Comment: Tubal Ligation   Other Topics Concern  . Not on file   Social History Narrative     Current outpatient prescriptions:   .  rizatriptan (MAXALT) 10 MG tablet, Take 10 mg by mouth as needed., Disp: , Rfl:  .  topiramate (TOPAMAX) 25 MG tablet, Take 25 mg by mouth 3 (three) times daily., Disp: , Rfl:  .  Armodafinil (NUVIGIL) 150 MG tablet, Take 1 tablet (150 mg total) by mouth daily., Disp: 30 tablet, Rfl: 2 .  fluconazole (DIFLUCAN) 150 MG tablet, Take 1 tablet (150 mg total) by mouth every other day., Disp: 3 tablet, Rfl: 0 .  metroNIDAZOLE (FLAGYL) 500 MG tablet, Take 4 tablets (2,000 mg total) by mouth once., Disp: 4 tablet, Rfl: 0  No Known Allergies   ROS  Constitutional: Negative for fever or weight change.  Respiratory: Negative for cough and shortness of breath.   Cardiovascular: Negative for chest pain or palpitations.  Gastrointestinal: Negative for abdominal pain, no bowel changes.  Musculoskeletal: Negative for gait problem or joint swelling.  Skin: Negative for rash.  Neurological: Negative for dizziness , she has recurrent  Headaches - she sees Dr. Domingo Cocking.  No other specific complaints in a complete review of systems (except as listed in HPI above).  Objective  Filed Vitals:   04/22/15 0918  BP: 102/58  Pulse: 73  Temp: 98.4 F (36.9 C)  TempSrc: Oral  Resp: 16  Height: 5\' 6"  (1.676 m)  Weight: 174 lb 14.4 oz (79.334 kg)  SpO2: 98%    Body mass index is 28.24 kg/(m^2).  Physical Exam  Constitutional: Patient appears well-developed and well-nourished. Obese  No distress.  HEENT: head atraumatic, normocephalic, pupils equal and reactive to light,neck supple, throat within normal limits Cardiovascular: Normal rate, regular rhythm and normal heart sounds.  No murmur heard. No BLE edema. Pulmonary/Chest: Effort normal and breath sounds normal. No respiratory distress. Abdominal: Soft.  There is no tenderness. GYN: pelvic exam showed moderate white/yellow vaginal discharge, normal bimanual exam Psychiatric: Patient has a normal mood and affect. behavior is normal. Judgment and  thought content normal.  Recent Results (from the past 2160 hour(s))  POCT urinalysis dipstick     Status: Normal   Collection Time: 04/22/15  9:40 AM  Result Value Ref Range   Color, UA yellow    Clarity, UA clear    Glucose, UA neg    Bilirubin, UA neg    Ketones, UA neg    Spec Grav, UA 1.020    Blood, UA trace    pH, UA 5.5    Protein, UA neg    Urobilinogen, UA 0.2    Nitrite, UA neg    Leukocytes, UA Negative Negative  POCT Wet Prep [facility] [place] [place])     Status: Abnormal   Collection Time: 04/22/15 10:02 AM  Result Value Ref Range   Source Wet Prep POC vaginal    WBC, Wet Prep HPF POC few    Bacteria Wet Prep HPF POC None None, Few, Too numerous to count   BACTERIA WET PREP MORPHOLOGY POC     Clue Cells Wet Prep HPF POC Few (A) None, Too numerous to count   Clue Cells Wet Prep Whiff POC     Yeast Wet Prep HPF POC Moderate    KOH Wet Prep POC     Trichomonas Wet Prep HPF POC moderate      PHQ2/9: Depression screen PHQ 2/9 04/22/2015  Decreased Interest 0  Down, Depressed, Hopeless 0  PHQ - 2 Score 0    Fall Risk: Fall Risk  04/22/2015  Falls in the past year? No     Functional Status Survey: Is the patient deaf or have difficulty hearing?: Yes (little hearing loss in left ear) Does the patient have difficulty seeing, even when wearing glasses/contacts?: Yes (glasses) Does the patient have difficulty concentrating, remembering, or making decisions?: No Does the patient have difficulty walking or climbing stairs?: No Does the patient have difficulty dressing or bathing?: No Does the patient have difficulty doing errands alone such as visiting a doctor's office or shopping?: No    Assessment & Plan  1. Vaginal discharge  - POCT Wet Prep ([place]) - fluconazole (DIFLUCAN) 150 MG tablet; Take 1 tablet (150 mg total) by mouth every other day.  Dispense: 3 tablet; Refill: 0  2. Needs flu shot  - Flu Vaccine QUAD 36+ mos PF IM (Fluarix & Fluzone Quad PF)  -refused  3. Hesitancy  - POCT urinalysis dipstick - GC/chlamydia probe amp, urine - Urine culture  4. Shift work sleep disorder  - Armodafinil (NUVIGIL) 150 MG tablet; Take 1 tablet (150 mg total) by mouth daily.  Dispense: 30 tablet; Refill: 2  5. Routine screening for STI (sexually transmitted infection)  - RPR - HIV antibody  6. Need for tetanus booster  - Tdap vaccine greater than or equal to 7yo IM  7. Trichomonal  vaginitis  - metroNIDAZOLE (FLAGYL) 500 MG tablet; Take 4 tablets (2,000 mg total) by mouth once.  Dispense: 4 tablet; Refill: 0

## 2015-04-22 NOTE — Patient Instructions (Signed)
Trichomoniasis Trichomoniasis is an infection caused by an organism called Trichomonas. The infection can affect both women and men. In women, the outer female genitalia and the vagina are affected. In men, the penis is mainly affected, but the prostate and other reproductive organs can also be involved. Trichomoniasis is a sexually transmitted infection (STI) and is most often passed to another person through sexual contact.  RISK FACTORS  Having unprotected sexual intercourse.  Having sexual intercourse with an infected partner. SIGNS AND SYMPTOMS  Symptoms of trichomoniasis in women include:  Abnormal gray-green frothy vaginal discharge.  Itching and irritation of the vagina.  Itching and irritation of the area outside the vagina. Symptoms of trichomoniasis in men include:   Penile discharge with or without pain.  Pain during urination. This results from inflammation of the urethra. DIAGNOSIS  Trichomoniasis may be found during a Pap test or physical exam. Your health care provider may use one of the following methods to help diagnose this infection:  Testing the pH of the vagina with a test tape.  Using a vaginal swab test that checks for the Trichomonas organism. A test is available that provides results within a few minutes.  Examining a urine sample.  Testing vaginal secretions. Your health care provider may test you for other STIs, including HIV. TREATMENT   You may be given medicine to fight the infection. Women should inform their health care provider if they could be or are pregnant. Some medicines used to treat the infection should not be taken during pregnancy.  Your health care provider may recommend over-the-counter medicines or creams to decrease itching or irritation.  Your sexual partner will need to be treated if infected.  Your health care provider may test you for infection again 3 months after treatment. HOME CARE INSTRUCTIONS   Take medicines only as  directed by your health care provider.  Take over-the-counter medicine for itching or irritation as directed by your health care provider.  Do not have sexual intercourse while you have the infection.  Women should not douche or wear tampons while they have the infection.  Discuss your infection with your partner. Your partner may have gotten the infection from you, or you may have gotten it from your partner.  Have your sex partner get examined and treated if necessary.  Practice safe, informed, and protected sex.  See your health care provider for other STI testing. SEEK MEDICAL CARE IF:   You still have symptoms after you finish your medicine.  You develop abdominal pain.  You have pain when you urinate.  You have bleeding after sexual intercourse.  You develop a rash.  Your medicine makes you sick or makes you throw up (vomit). MAKE SURE YOU:  Understand these instructions.  Will watch your condition.  Will get help right away if you are not doing well or get worse.   This information is not intended to replace advice given to you by your health care provider. Make sure you discuss any questions you have with your health care provider.   Document Released: 10/25/2000 Document Revised: 05/22/2014 Document Reviewed: 02/10/2013 Elsevier Interactive Patient Education 2016 [employer]  

## 2015-04-23 LAB — URINE CULTURE: Organism ID, Bacteria: NO GROWTH

## 2015-04-23 LAB — HIV ANTIBODY (ROUTINE TESTING W REFLEX): HIV Screen 4th Generation wRfx: NONREACTIVE

## 2015-04-23 LAB — RPR: RPR Ser Ql: NONREACTIVE

## 2015-04-26 ENCOUNTER — Telehealth: Payer: Self-pay | Admitting: Family Medicine

## 2015-04-26 DIAGNOSIS — A5901 Trichomonal vulvovaginitis: Secondary | ICD-10-CM

## 2015-04-26 NOTE — Telephone Encounter (Signed)
I submitted all three prior authorizations on [email or web address] on 04/26/15 and the patient was informed it can take up to 5-7 business days to hear back from [facility]. But the antibiotics should only be $5 dollars each with cash paying, but the patient still wanted to wait on Insurance.

## 2015-04-26 NOTE — Telephone Encounter (Signed)
Stated that diflucan, nuvigal and flagyl is needing authorization.

## 2015-04-27 ENCOUNTER — Telehealth: Payer: Self-pay | Admitting: Family Medicine

## 2015-04-27 LAB — SPECIMEN STATUS REPORT

## 2015-04-27 LAB — GC/CHLAMYDIA PROBE AMP
Chlamydia trachomatis, NAA: NEGATIVE
Neisseria gonorrhoeae by PCR: NEGATIVE

## 2015-04-27 NOTE — Telephone Encounter (Signed)
I am sorry, nothing really approved for shift work sleep disorder except for nuvigil

## 2015-04-27 NOTE — Telephone Encounter (Signed)
Medicaid will not cover nuvigil, is there something else you can provide.

## 2015-04-28 MED ORDER — METRONIDAZOLE 500 MG PO TABS: 2000.0000 mg | ORAL_TABLET | Freq: Two times a day (BID) | ORAL | Status: DC

## 2015-04-28 NOTE — Telephone Encounter (Signed)
Change to one twice daily for 7 days

## 2015-04-28 NOTE — Telephone Encounter (Signed)
Patient called states she vomited after taking the flagyl.  Does she need to take again or can she get something else?

## 2015-04-30 ENCOUNTER — Other Ambulatory Visit: Payer: Self-pay

## 2015-04-30 DIAGNOSIS — A5901 Trichomonal vulvovaginitis: Secondary | ICD-10-CM

## 2015-04-30 MED ORDER — METRONIDAZOLE 500 MG PO TABS: 2000.0000 mg | ORAL_TABLET | Freq: Two times a day (BID) | ORAL | Status: DC

## 2015-05-11 ENCOUNTER — Other Ambulatory Visit: Payer: Self-pay | Admitting: Family Medicine

## 2015-05-11 ENCOUNTER — Encounter: Payer: Self-pay | Admitting: Family Medicine

## 2015-05-11 ENCOUNTER — Ambulatory Visit (INDEPENDENT_AMBULATORY_CARE_PROVIDER_SITE_OTHER): Payer: Medicaid Other | Admitting: Family Medicine

## 2015-05-11 VITALS — BP 108/60 | HR 88 | Temp 98.1°F | Resp 18 | Ht 66.0 in | Wt 170.0 lb

## 2015-05-11 DIAGNOSIS — A5901 Trichomonal vulvovaginitis: Secondary | ICD-10-CM

## 2015-05-11 DIAGNOSIS — N72 Inflammatory disease of cervix uteri: Secondary | ICD-10-CM | POA: Diagnosis not present

## 2015-05-11 DIAGNOSIS — Z113 Encounter for screening for infections with a predominantly sexual mode of transmission: Secondary | ICD-10-CM

## 2015-05-11 DIAGNOSIS — N898 Other specified noninflammatory disorders of vagina: Secondary | ICD-10-CM | POA: Diagnosis not present

## 2015-05-11 LAB — POCT WET PREP (WET MOUNT)

## 2015-05-11 MED ORDER — AZITHROMYCIN 500 MG PO TABS
500.0000 mg | ORAL_TABLET | Freq: Once | ORAL | Status: AC
  Administered 2015-05-11: 500 mg via ORAL

## 2015-05-11 MED ORDER — LIDOCAINE HCL (PF) 1 % IJ SOLN
2.0000 mL | Freq: Once | INTRAMUSCULAR | Status: AC
Start: 2015-05-11 — End: 2015-05-11
  Administered 2015-05-11: 2 mL via INTRADERMAL

## 2015-05-11 MED ORDER — CEFTRIAXONE SODIUM 250 MG IJ SOLR
250.0000 mg | Freq: Once | INTRAMUSCULAR | Status: AC
  Administered 2015-05-11: 250 mg via INTRAMUSCULAR

## 2015-05-11 MED ORDER — FLUCONAZOLE 150 MG PO TABS: 150.0000 mg | ORAL_TABLET | ORAL | Status: DC

## 2015-05-11 MED ORDER — METRONIDAZOLE 500 MG PO TABS: 500.0000 mg | ORAL_TABLET | Freq: Two times a day (BID) | ORAL | Status: DC

## 2015-05-11 NOTE — Progress Notes (Signed)
Name: Tammy Garza   MRN: [id number]    DOB: 01/29/1981   Date:05/11/2015       Progress Note  Subjective  Chief Complaint  Chief Complaint  Patient presents with  . Vaginal Discharge    Onset- Since 04/22/2015, worsten since taking Antibiotic and Diflucan, Itching in panty line and larger amounts of discharge and vaginal odor.    . Shift work sleep disorder    Patient Insurance denied [place] for Nuvigil and wanted to see if there was something else you could prescribe her inplace of it.     HPI  Vaginal discharge: she was seen a few weeks ago and was diagnosed with trichomonas and yeast vaginitis. She was treated Metronidazole and Diflucan but symptoms did not completely resolve and is worse, has noticed more discharge, now has odor. She has been separated since June , but dating someone else since August.   SWSD: unfortunately she lost her job and is now on [employer]- Nuvigil no longer covered by insurance, explained we can resume medication when she has private insurance again  Patient Active Problem List   Diagnosis Date Noted  . HPV test positive 04/22/2015  . History of Helicobacter pylori infection 04/22/2015  . Migraine with aura and without status migrainosus 04/22/2015  . Shift work sleep disorder 04/22/2015    Past Surgical History  Procedure Laterality Date  . Tubal ligation    . Cholecystectomy      Family History  Problem Relation Age of Onset  . HIV Sister   . Asthma Daughter   . Hypertension Maternal Grandmother   . Hypertension Mother     Social History   Social History  . Marital Status: Married    Spouse Name: N/A  . Number of Children: N/A  . Years of Education: N/A   Occupational History  . Not on file.   Social History Main Topics  . Smoking status: Former Research scientist (life sciences)  . Smokeless tobacco: Not on file  . Alcohol Use: Yes  . Drug Use: No  . Sexual Activity: Yes    Birth Control/ Protection: Other-see comments, Surgical     Comment: Tubal Ligation    Other Topics Concern  . Not on file   Social History Narrative   Separated since June 2016 - she has two children at home . The youngest is his daughter. Working at [employer] in [place] through a [employer]     Current outpatient prescriptions:  .  rizatriptan (MAXALT) 10 MG tablet, Take 10 mg by mouth as needed., Disp: , Rfl:  .  topiramate (TOPAMAX) 100 MG tablet, Take 1 tablet by mouth daily., Disp: , Rfl: 2 .  fluconazole (DIFLUCAN) 150 MG tablet, Take 1 tablet (150 mg total) by mouth every other day., Disp: 3 tablet, Rfl: 0 .  metroNIDAZOLE (FLAGYL) 500 MG tablet, Take 1 tablet (500 mg total) by mouth 2 (two) times daily., Disp: 14 tablet, Rfl: 0  Current facility-administered medications:  .  azithromycin (ZITHROMAX) tablet 500 mg, 500 mg, Oral, Once, [name], MD  No Known Allergies   ROS  Ten systems reviewed and is negative except as mentioned in HPI   Objective  Filed Vitals:   05/11/15 1618  BP: 108/60  Pulse: 88  Temp: 98.1 F (36.7 C)  TempSrc: Oral  Resp: 18  Height: 5\' 6"  (1.676 m)  Weight: 170 lb (77.111 kg)  SpO2: 98%    Body mass index is 27.45 kg/(m^2).  Physical Exam  Constitutional: Patient  appears well-developed and well-nourished. Obese No distress.  HEENT: head atraumatic, normocephalic, pupils equal and reactive to light,neck supple, throat within normal limits Cardiovascular: Normal rate, regular rhythm and normal heart sounds. No murmur heard. No BLE edema. Pulmonary/Chest: Effort normal and breath sounds normal. No respiratory distress. Abdominal: Soft. There is no tenderness. GYN: pelvic exam showed moderate white/yellow, cottage cheese like with some yellow  vaginal discharge, tender suprapubic area with  bimanual exam Psychiatric: Patient has a normal mood and affect. behavior is normal. Judgment and thought content normal.  Recent Results (from the past 2160 hour(s))  Urine culture     Status: None   Collection Time:  04/22/15 12:00 AM  Result Value Ref Range   Urine Culture, Routine Final report    Urine Culture result 1 No growth   Specimen status report     Status: None   Collection Time: 04/22/15 12:00 AM  Result Value Ref Range   specimen status report Comment     Comment: Written Authorization Written Authorization Written Authorization Received. Authorization received from original req 04-23-2015 Logged by Tiana Loft   GC/Chlamydia Probe Amp     Status: None   Collection Time: 04/22/15 12:00 AM  Result Value Ref Range   Chlamydia trachomatis, NAA Negative Negative   Neisseria gonorrhoeae by PCR Negative Negative  POCT urinalysis dipstick     Status: Normal   Collection Time: 04/22/15  9:40 AM  Result Value Ref Range   Color, UA yellow    Clarity, UA clear    Glucose, UA neg    Bilirubin, UA neg    Ketones, UA neg    Spec Grav, UA 1.020    Blood, UA trace    pH, UA 5.5    Protein, UA neg    Urobilinogen, UA 0.2    Nitrite, UA neg    Leukocytes, UA Negative Negative  POCT Wet Prep [facility])     Status: Abnormal   Collection Time: 04/22/15 10:02 AM  Result Value Ref Range   Source Wet Prep POC vaginal    WBC, Wet Prep HPF POC few    Bacteria Wet Prep HPF POC None None, Few, Too numerous to count   BACTERIA WET PREP MORPHOLOGY POC     Clue Cells Wet Prep HPF POC Few (A) None, Too numerous to count   Clue Cells Wet Prep Whiff POC     Yeast Wet Prep HPF POC Moderate    KOH Wet Prep POC     Trichomonas Wet Prep HPF POC moderate   RPR     Status: None   Collection Time: 04/22/15 10:11 AM  Result Value Ref Range   RPR Ser Ql Non Reactive Non Reactive  HIV antibody     Status: None   Collection Time: 04/22/15 10:11 AM  Result Value Ref Range   HIV Screen 4th Generation wRfx Non Reactive Non Reactive  POCT Wet Prep [facility])     Status: Abnormal   Collection Time: 05/11/15  4:54 PM  Result Value Ref Range   Source Wet Prep POC vaginal    WBC, Wet Prep HPF POC few     Bacteria Wet Prep HPF POC Few None, Few, Too numerous to count   BACTERIA WET PREP MORPHOLOGY POC     Clue Cells Wet Prep HPF POC Many (A) None, Too numerous to count   Clue Cells Wet Prep Whiff POC     Yeast Wet Prep HPF POC Few  KOH Wet Prep POC     Trichomonas Wet Prep HPF POC none      PHQ2/9: Depression screen PHQ 2/9 04/22/2015  Decreased Interest 0  Down, Depressed, Hopeless 0  PHQ - 2 Score 0     Fall Risk: Fall Risk  04/22/2015  Falls in the past year? No    Assessment & Plan  1. Trichomonal vaginitis   Treated. Not present on wet prep now  2. Vaginal discharge  She has BV on wet smear, during exam cottage cheese like discharge we will treat for both - POCT Wet Prep ([facility]) - fluconazole (DIFLUCAN) 150 MG tablet; Take 1 tablet (150 mg total) by mouth every other day.  Dispense: 3 tablet; Refill: 0 - metroNIDAZOLE (FLAGYL) 500 MG tablet; Take 1 tablet (500 mg total) by mouth 2 (two) times daily.  Dispense: 14 tablet; Refill: 0  3. Routine screening for STI (sexually transmitted infection)  - GC/chlamydia probe amp, urine  4. Cervicitis  New sexual partner, tender during bimanual motion tenderness, we will treat empirically today - cefTRIAXone (ROCEPHIN) injection 250 mg; Inject 250 mg into the muscle once. - azithromycin (ZITHROMAX) tablet 500 mg; Take 1 tablet (500 mg total) by mouth once.

## 2015-05-14 LAB — SPECIMEN STATUS REPORT

## 2015-05-14 LAB — GC/CHLAMYDIA PROBE AMP
Chlamydia trachomatis, NAA: NEGATIVE
Neisseria gonorrhoeae by PCR: NEGATIVE

## 2015-06-11 ENCOUNTER — Ambulatory Visit: Payer: Medicaid Other | Admitting: Family Medicine

## 2015-07-26 ENCOUNTER — Ambulatory Visit (INDEPENDENT_AMBULATORY_CARE_PROVIDER_SITE_OTHER): Payer: Medicaid Other | Admitting: Family Medicine

## 2015-07-26 ENCOUNTER — Encounter: Payer: Self-pay | Admitting: Family Medicine

## 2015-07-26 VITALS — BP 118/74 | HR 85 | Temp 98.0°F | Resp 20 | Ht 66.0 in | Wt 177.4 lb

## 2015-07-26 DIAGNOSIS — Z114 Encounter for screening for human immunodeficiency virus [HIV]: Secondary | ICD-10-CM

## 2015-07-26 DIAGNOSIS — B977 Papillomavirus as the cause of diseases classified elsewhere: Secondary | ICD-10-CM

## 2015-07-26 DIAGNOSIS — Z01419 Encounter for gynecological examination (general) (routine) without abnormal findings: Secondary | ICD-10-CM

## 2015-07-26 DIAGNOSIS — Z131 Encounter for screening for diabetes mellitus: Secondary | ICD-10-CM

## 2015-07-26 DIAGNOSIS — R5383 Other fatigue: Secondary | ICD-10-CM

## 2015-07-26 DIAGNOSIS — Z0000 Encounter for general adult medical examination without abnormal findings: Secondary | ICD-10-CM

## 2015-07-26 DIAGNOSIS — R888 Abnormal findings in other body fluids and substances: Secondary | ICD-10-CM

## 2015-07-26 DIAGNOSIS — E041 Nontoxic single thyroid nodule: Secondary | ICD-10-CM | POA: Insufficient documentation

## 2015-07-26 DIAGNOSIS — R635 Abnormal weight gain: Secondary | ICD-10-CM | POA: Diagnosis not present

## 2015-07-26 DIAGNOSIS — Z124 Encounter for screening for malignant neoplasm of cervix: Secondary | ICD-10-CM | POA: Diagnosis not present

## 2015-07-26 DIAGNOSIS — Z308 Encounter for other contraceptive management: Secondary | ICD-10-CM

## 2015-07-26 DIAGNOSIS — Z13220 Encounter for screening for lipoid disorders: Secondary | ICD-10-CM

## 2015-07-26 DIAGNOSIS — IMO0002 Reserved for concepts with insufficient information to code with codable children: Secondary | ICD-10-CM

## 2015-07-26 DIAGNOSIS — E049 Nontoxic goiter, unspecified: Secondary | ICD-10-CM

## 2015-07-26 DIAGNOSIS — E559 Vitamin D deficiency, unspecified: Secondary | ICD-10-CM

## 2015-07-26 MED ORDER — MEDROXYPROGESTERONE ACETATE 150 MG/ML IM SUSP: 150.0000 mg | INTRAMUSCULAR | Status: DC

## 2015-07-26 NOTE — Progress Notes (Signed)
Name: Tammy Garza   MRN: [id number]    DOB: 20-Feb-1981   Date:07/26/2015       Progress Note  Subjective  Chief Complaint  Chief Complaint  Patient presents with  . Annual Exam    HPI  Well Woman: she separated in June 2016, dating someone since December 2016. No vaginal discharge, cycles are regular, no bladder problems. Feeling tired, but works night shift, sleeping during the day. Previous history of vitamin D deficiency, but not taking supplementation.    Patient Active Problem List   Diagnosis Date Noted  . Vitamin D deficiency 07/26/2015  . HPV test positive 04/22/2015  . History of Helicobacter pylori infection 04/22/2015  . Migraine with aura and without status migrainosus 04/22/2015  . Shift work sleep disorder 04/22/2015    Past Surgical History  Procedure Laterality Date  . Tubal ligation    . Cholecystectomy      Family History  Problem Relation Age of Onset  . HIV Sister   . Asthma Daughter   . Hypertension Maternal Grandmother   . Hypertension Mother     Social History   Social History  . Marital Status: Married    Spouse Name: N/A  . Number of Children: N/A  . Years of Education: N/A   Occupational History  . Not on file.   Social History Main Topics  . Smoking status: Former Research scientist (life sciences)  . Smokeless tobacco: Not on file  . Alcohol Use: Yes  . Drug Use: No  . Sexual Activity: Yes    Birth Control/ Protection: Other-see comments, Surgical     Comment: Tubal Ligation   Other Topics Concern  . Not on file   Social History Narrative   Separated since June 2016 - she has two children at home . The youngest is his daughter. Working at [employer] in [place] through a [employer]     Current outpatient prescriptions:  .  rizatriptan (MAXALT) 10 MG tablet, Take 10 mg by mouth as needed., Disp: , Rfl:  .  topiramate (TOPAMAX) 100 MG tablet, Take 1 tablet by mouth daily., Disp: , Rfl: 2  No Known Allergies   ROS  Constitutional: Negative for  fever, positive for  weight change.  Respiratory: Negative for cough and shortness of breath.   Cardiovascular: Negative for chest pain or palpitations.  Gastrointestinal: Negative for abdominal pain, no bowel changes.  Musculoskeletal: Negative for gait problem or joint swelling.  Skin: Negative for rash.  Neurological: Negative for dizziness , positive for intermittent  headache.  No other specific complaints in a complete review of systems (except as listed in HPI above).  Objective  Filed Vitals:   07/26/15 0834  BP: 118/74  Pulse: 85  Temp: 98 F (36.7 C)  Resp: 20  Height: 5\' 6"  (1.676 m)  Weight: 177 lb 6 oz (80.457 kg)  SpO2: 97%    Body mass index is 28.64 kg/(m^2).  Physical Exam  Constitutional: Patient appears well-developed and well-nourished, and overweight. No distress.  HENT: Head: Normocephalic and atraumatic. Ears: B TMs ok, no erythema or effusion; Nose: Nose normal. Mouth/Throat: Oropharynx is clear and moist. No oropharyngeal exudate.  Eyes: Conjunctivae and EOM are normal. Pupils are equal, round, and reactive to light. No scleral icterus.  Neck: Normal range of motion. Neck supple. No JVD present. Positive for  thyromegaly  Cardiovascular: Normal rate, regular rhythm and normal heart sounds.  No murmur heard. No BLE edema. Pulmonary/Chest: Effort normal and breath sounds normal. No  respiratory distress. Abdominal: Soft. Bowel sounds are normal, no distension. There is no tenderness. no masses Breast: no lumps or masses, no nipple discharge or rashes FEMALE GENITALIA:  External genitalia normal External urethra normal Vaginal vault normal without discharge or lesions Cervix normal without discharge or lesions Bimanual exam normal without masses RECTAL: not done Musculoskeletal: Normal range of motion, no joint effusions. No gross deformities Neurological: he is alert and oriented to person, place, and time. No cranial nerve deficit. Coordination,  balance, strength, speech and gait are normal.  Skin: Skin is warm and dry. No rash noted. No erythema.  Psychiatric: Patient has a normal mood and affect. behavior is normal. Judgment and thought content normal.  Recent Results (from the past 2160 hour(s))  Specimen status report     Status: None   Collection Time: 05/11/15  5:00 AM  Result Value Ref Range   specimen status report Comment     Comment: Written Authorization Written Authorization Written Authorization Received. Authorization received from Original Req 05-13-2015 Logged by Bland Span   GC/Chlamydia Probe Amp     Status: None   Collection Time: 05/11/15  5:00 AM  Result Value Ref Range   Chlamydia trachomatis, NAA Negative Negative   Neisseria gonorrhoeae by PCR Negative Negative  POCT Wet Prep [facility])     Status: Abnormal   Collection Time: 05/11/15  4:54 PM  Result Value Ref Range   Source Wet Prep POC vaginal    WBC, Wet Prep HPF POC few    Bacteria Wet Prep HPF POC Few None, Few, Too numerous to count   BACTERIA WET PREP MORPHOLOGY POC     Clue Cells Wet Prep HPF POC Many (A) None, Too numerous to count   Clue Cells Wet Prep Whiff POC     Yeast Wet Prep HPF POC Few    KOH Wet Prep POC     Trichomonas Wet Prep HPF POC none       PHQ2/9: Depression screen [facility] 2/9 07/26/2015 04/22/2015  Decreased Interest 0 0  Down, Depressed, Hopeless 0 0  PHQ - 2 Score 0 0    Fall Risk: Fall Risk  07/26/2015 04/22/2015  Falls in the past year? No No    Functional Status Survey: Is the patient deaf or have difficulty hearing?: No Does the patient have difficulty seeing, even when wearing glasses/contacts?: No Does the patient have difficulty concentrating, remembering, or making decisions?: No Does the patient have difficulty walking or climbing stairs?: No Does the patient have difficulty dressing or bathing?: No Does the patient have difficulty doing errands alone such as visiting a doctor's office or  shopping?: No    Assessment & Plan  1. Well woman exam  Discussed importance of 150 minutes of physical activity weekly, eat two servings of fish weekly, eat one serving of tree nuts ( cashews, pistachios, pecans, almonds.Marland Kitchen) every other day, eat 6 servings of fruit/vegetables daily and drink plenty of water and avoid sweet beverages.   2. Weight gain   - TSH  3. Cervical cancer screening  - Pap IG, CT/NG NAA, and HPV (high risk)  4. HPV test positive   5. Lipid screening  - Lipid panel  6. Diabetes mellitus screening  - Hemoglobin A1c  7. Other fatigue  - CBC with Differential/Platelet - Comprehensive metabolic panel - Vitamin 123456 - VITAMIN D 25 Hydroxy (Vit-D Deficiency, Fractures) - TSH  8. Vitamin D deficiency  - VITAMIN D 25 Hydroxy (Vit-D Deficiency, Fractures)  9. Encounter for screening for HIV  - HIV antibody   10. Encounter for other contraceptive management  She had a tubal ligation, but is going on vacation in April and does not want to have her cycle. She would like to go back on Depo since it also helped her loose weight. Discussed risk and benefits, she will return the day of her next cycle for the Depo - medroxyPROGESTERone (DEPO-PROVERA) 150 MG/ML injection; Inject 1 mL (150 mg total) into the muscle every 3 (three) months.  Dispense: 1 mL; Refill: 0   11. Enlarged thyroid  - US Soft Tissue Head/Neck; Future

## 2015-07-27 ENCOUNTER — Other Ambulatory Visit: Payer: Self-pay | Admitting: Family Medicine

## 2015-07-27 LAB — COMPREHENSIVE METABOLIC PANEL
ALT: 14 IU/L (ref 0–32)
AST: 15 IU/L (ref 0–40)
Albumin/Globulin Ratio: 1.4 (ref 1.2–2.2)
Albumin: 4.2 g/dL (ref 3.5–5.5)
Alkaline Phosphatase: 53 IU/L (ref 39–117)
BUN/Creatinine Ratio: 9 (ref 8–20)
BUN: 6 mg/dL (ref 6–20)
Bilirubin Total: 0.6 mg/dL (ref 0.0–1.2)
CO2: 25 mmol/L (ref 18–29)
Calcium: 9.3 mg/dL (ref 8.7–10.2)
Chloride: 99 mmol/L (ref 96–106)
Creatinine, Ser: 0.69 mg/dL (ref 0.57–1.00)
GFR calc Af Amer: 130 mL/min/{1.73_m2} (ref >59)
GFR calc non Af Amer: 113 mL/min/{1.73_m2} (ref >59)
Globulin, Total: 3 g/dL (ref 1.5–4.5)
Glucose: 82 mg/dL (ref 65–99)
Potassium: 4.5 mmol/L (ref 3.5–5.2)
Sodium: 141 mmol/L (ref 134–144)
Total Protein: 7.2 g/dL (ref 6.0–8.5)

## 2015-07-27 LAB — CBC WITH DIFFERENTIAL/PLATELET
Basophils Absolute: 0 10*3/uL (ref 0.0–0.2)
Basos: 0 %
EOS (ABSOLUTE): 0.2 10*3/uL (ref 0.0–0.4)
Eos: 3 %
Hematocrit: 37.5 % (ref 34.0–46.6)
Hemoglobin: 12.4 g/dL (ref 11.1–15.9)
Immature Grans (Abs): 0 10*3/uL (ref 0.0–0.1)
Immature Granulocytes: 0 %
Lymphocytes Absolute: 4.2 10*3/uL — ABNORMAL HIGH (ref 0.7–3.1)
Lymphs: 47 %
MCH: 30.7 pg (ref 26.6–33.0)
MCHC: 33.1 g/dL (ref 31.5–35.7)
MCV: 93 fL (ref 79–97)
Monocytes Absolute: 0.8 10*3/uL (ref 0.1–0.9)
Monocytes: 8 %
Neutrophils Absolute: 3.8 10*3/uL (ref 1.4–7.0)
Neutrophils: 42 %
Platelets: 302 10*3/uL (ref 150–379)
RBC: 4.04 x10E6/uL (ref 3.77–5.28)
RDW: 12.4 % (ref 12.3–15.4)
WBC: 9.1 10*3/uL (ref 3.4–10.8)

## 2015-07-27 LAB — LIPID PANEL
Chol/HDL Ratio: 2.2 ratio units (ref 0.0–4.4)
Cholesterol, Total: 152 mg/dL (ref 100–199)
HDL: 70 mg/dL (ref >39)
LDL Calculated: 68 mg/dL (ref 0–99)
Triglycerides: 70 mg/dL (ref 0–149)
VLDL Cholesterol Cal: 14 mg/dL (ref 5–40)

## 2015-07-27 LAB — HEMOGLOBIN A1C
Est. average glucose Bld gHb Est-mCnc: 103 mg/dL
Hgb A1c MFr Bld: 5.2 % (ref 4.8–5.6)

## 2015-07-27 LAB — TSH: TSH: 3.44 u[IU]/mL (ref 0.450–4.500)

## 2015-07-27 LAB — HIV ANTIBODY (ROUTINE TESTING W REFLEX): HIV Screen 4th Generation wRfx: NONREACTIVE

## 2015-07-27 LAB — VITAMIN D 25 HYDROXY (VIT D DEFICIENCY, FRACTURES): Vit D, 25-Hydroxy: 9.9 ng/mL — ABNORMAL LOW (ref 30.0–100.0)

## 2015-07-27 LAB — VITAMIN B12: Vitamin B-12: 525 pg/mL (ref 211–946)

## 2015-07-27 MED ORDER — VITAMIN D (ERGOCALCIFEROL) 1.25 MG (50000 UNIT) PO CAPS: 50000.0000 [IU] | ORAL_CAPSULE | ORAL | Status: DC

## 2015-07-27 NOTE — Addendum Note (Signed)
Addended by: [name] F on: 07/27/2015 01:18 PM   Modules accepted: Miquel Dunn

## 2015-07-27 NOTE — Addendum Note (Signed)
Addended by: [name] F on: 07/27/2015 01:16 PM   Modules accepted: Miquel Dunn

## 2015-07-28 LAB — PAP IG, CT-NG NAA, HPV HIGH-RISK
Chlamydia, Nuc. Acid Amp: NEGATIVE
Gonococcus by Nucleic Acid Amp: NEGATIVE
HPV, high-risk: POSITIVE — AB
PAP Smear Comment: 0

## 2015-07-29 ENCOUNTER — Telehealth: Payer: Self-pay

## 2015-07-29 NOTE — Telephone Encounter (Signed)
Patient was informed of recent lab results and said thanks.

## 2015-07-30 ENCOUNTER — Ambulatory Visit
Admission: RE | Admit: 2015-07-30 | Discharge: 2015-07-30 | Disposition: A | Discharge status: Home / Self Care | Payer: Medicaid Other | Source: Ambulatory Visit | Attending: Family Medicine | Admitting: Family Medicine

## 2015-07-30 DIAGNOSIS — E049 Nontoxic goiter, unspecified: Secondary | ICD-10-CM | POA: Diagnosis not present

## 2015-08-12 ENCOUNTER — Ambulatory Visit (INDEPENDENT_AMBULATORY_CARE_PROVIDER_SITE_OTHER): Payer: Commercial Managed Care - HMO

## 2015-08-12 DIAGNOSIS — Z3049 Encounter for surveillance of other contraceptives: Secondary | ICD-10-CM | POA: Diagnosis not present

## 2015-08-12 DIAGNOSIS — Z3042 Encounter for surveillance of injectable contraceptive: Secondary | ICD-10-CM

## 2015-08-12 MED ORDER — MEDROXYPROGESTERONE ACETATE 150 MG/ML IM SUSP
150.0000 mg | Freq: Once | INTRAMUSCULAR | Status: AC
  Administered 2015-08-12: 150 mg via INTRAMUSCULAR

## 2015-10-27 ENCOUNTER — Other Ambulatory Visit: Payer: Self-pay | Admitting: Family Medicine

## 2015-10-27 NOTE — Telephone Encounter (Signed)
Patient requesting refill. 

## 2016-01-11 ENCOUNTER — Ambulatory Visit (INDEPENDENT_AMBULATORY_CARE_PROVIDER_SITE_OTHER): Payer: Commercial Managed Care - HMO | Admitting: Family Medicine

## 2016-01-11 ENCOUNTER — Encounter: Payer: Self-pay | Admitting: Family Medicine

## 2016-01-11 VITALS — BP 118/82 | HR 82 | Temp 98.3°F | Resp 18 | Wt 188.3 lb

## 2016-01-11 DIAGNOSIS — R609 Edema, unspecified: Secondary | ICD-10-CM | POA: Diagnosis not present

## 2016-01-11 DIAGNOSIS — Z3049 Encounter for surveillance of other contraceptives: Secondary | ICD-10-CM

## 2016-01-11 DIAGNOSIS — Z3042 Encounter for surveillance of injectable contraceptive: Secondary | ICD-10-CM

## 2016-01-11 DIAGNOSIS — Z79899 Other long term (current) drug therapy: Secondary | ICD-10-CM

## 2016-01-11 DIAGNOSIS — E669 Obesity, unspecified: Secondary | ICD-10-CM

## 2016-01-11 DIAGNOSIS — E559 Vitamin D deficiency, unspecified: Secondary | ICD-10-CM

## 2016-01-11 DIAGNOSIS — E66811 Obesity, class 1: Secondary | ICD-10-CM

## 2016-01-11 DIAGNOSIS — G43109 Migraine with aura, not intractable, without status migrainosus: Secondary | ICD-10-CM | POA: Diagnosis not present

## 2016-01-11 LAB — CBC WITH DIFFERENTIAL/PLATELET
Basophils Absolute: 0 cells/uL (ref 0–200)
Basophils Relative: 0 %
Eosinophils Absolute: 255 cells/uL (ref 15–500)
Eosinophils Relative: 3 %
HCT: 37.6 % (ref 35.0–45.0)
Hemoglobin: 12.2 g/dL (ref 11.7–15.5)
Lymphocytes Relative: 34 %
Lymphs Abs: 2890 cells/uL (ref 850–3900)
MCH: 29.8 pg (ref 27.0–33.0)
MCHC: 32.4 g/dL (ref 32.0–36.0)
MCV: 91.9 fL (ref 80.0–100.0)
MPV: 11.2 fL (ref 7.5–12.5)
Monocytes Absolute: 765 cells/uL (ref 200–950)
Monocytes Relative: 9 %
Neutro Abs: 4590 cells/uL (ref 1500–7800)
Neutrophils Relative %: 54 %
Platelets: 276 10*3/uL (ref 140–400)
RBC: 4.09 MIL/uL (ref 3.80–5.10)
RDW: 12.9 % (ref 11.0–15.0)
WBC: 8.5 10*3/uL (ref 3.8–10.8)

## 2016-01-11 LAB — COMPLETE METABOLIC PANEL WITH GFR
ALT: 11 U/L (ref 6–29)
AST: 12 U/L (ref 10–30)
Albumin: 4.1 g/dL (ref 3.6–5.1)
Alkaline Phosphatase: 47 U/L (ref 33–115)
BUN: 8 mg/dL (ref 7–25)
CO2: 28 mmol/L (ref 20–31)
Calcium: 9.4 mg/dL (ref 8.6–10.2)
Chloride: 107 mmol/L (ref 98–110)
Creat: 0.85 mg/dL (ref 0.50–1.10)
GFR, Est African American: >89 mL/min (ref >=60)
GFR, Est Non African American: 88 mL/min (ref >=60)
Glucose, Bld: 97 mg/dL (ref 65–99)
Potassium: 4.6 mmol/L (ref 3.5–5.3)
Sodium: 139 mmol/L (ref 135–146)
Total Bilirubin: 0.4 mg/dL (ref 0.2–1.2)
Total Protein: 7 g/dL (ref 6.1–8.1)

## 2016-01-11 MED ORDER — MEDROXYPROGESTERONE ACETATE 150 MG/ML IM SUSP: 150.0000 mg | INTRAMUSCULAR | 3 refills | Status: DC

## 2016-01-11 MED ORDER — INSULIN PEN NEEDLE 32G X 4 MM MISC: 1.0000 | Freq: Every day | 1 refills | Status: DC

## 2016-01-11 MED ORDER — LIRAGLUTIDE -WEIGHT MANAGEMENT 18 MG/3ML ~~LOC~~ SOPN: 3.6000 mg | PEN_INJECTOR | Freq: Every day | SUBCUTANEOUS | 0 refills | Status: DC

## 2016-01-11 MED ORDER — VITAMIN D 50 MCG (2000 UT) PO CAPS: 1.0000 | ORAL_CAPSULE | Freq: Every day | ORAL | 0 refills | Status: DC

## 2016-01-11 MED ORDER — LIRAGLUTIDE -WEIGHT MANAGEMENT 18 MG/3ML ~~LOC~~ SOPN: 3.0000 mg | PEN_INJECTOR | Freq: Every day | SUBCUTANEOUS | 0 refills | Status: DC

## 2016-01-11 NOTE — Addendum Note (Signed)
Addended by: [name] F on: 01/11/2016 03:42 PM   Modules accepted: Orders

## 2016-01-11 NOTE — Patient Instructions (Addendum)
We will try Saxenda for weight loss, but if not covered by your insurance please contact Dr. Jalene Mullet and ask him which medication you can take. -Qsymia -Contrave or -Belviq  Saxenda:  Start with Victoza sample at 0.6 mg first week, 1.2 mg second week, 1.8 mg third week and once you get rx of Saxenda filled you go to 2.4 mg for fourth week and finally stay at 3 mg after that

## 2016-01-11 NOTE — Progress Notes (Signed)
Name: Tammy Garza   MRN: [id number]    DOB: Sep 25, 1980   Date:01/11/2016       Progress Note  Subjective  Chief Complaint  Chief Complaint  Patient presents with  . Edema    Patient states she has been experiencing bilateral feet and hand edema since Saturday. Patient states she has been soaking the warm epsom salt for relied and massaging them.     HPI  Edema: she had a severe episode of swelling on hands and feet Saturday, after she ate pork chops and fried pickles, she noticed swelling and pain that evening. Swelling has gone down, but still has some soreness on the bottom of her feet. She has tingling on hands but that is since she started on Topamax for migraine. Last labs about 6 months ago and included normal CBC , kidney function and TSH  Migraine: doing better, seeing Dr. Domingo Cocking and is doing well on Topamax, but she would like to go on Depo and see if she can stop taking Topamax, since migraines were not present while on Depo. Currently symptoms only before her cycle  Thyroid cyst: repeat US in March 2017, no dysphagia, she has gained weight, but no dry skin or constipation  Obesity: she has been eating healthier, exercising, but continues to gain weight. Worse over the past few months. She is also feeling tired. She states weight has increased since started on Topamax. She would like to try medication   Patient Active Problem List   Diagnosis Date Noted  . Vitamin D deficiency 07/26/2015  . Thyroid cyst 07/26/2015  . HPV test positive 04/22/2015  . History of Helicobacter pylori infection 04/22/2015  . Migraine with aura and without status migrainosus 04/22/2015  . Shift work sleep disorder 04/22/2015    Past Surgical History:  Procedure Laterality Date  . CHOLECYSTECTOMY    . TUBAL LIGATION      Family History  Problem Relation Age of Onset  . HIV Sister   . Asthma Daughter   . Hypertension Maternal Grandmother   . Hypertension Mother     Social History    Social History  . Marital status: Married    Spouse name: N/A  . Number of children: N/A  . Years of education: N/A   Occupational History  . Not on file.   Social History Main Topics  . Smoking status: Former Research scientist (life sciences)  . Smokeless tobacco: Never Used  . Alcohol use Yes  . Drug use: No  . Sexual activity: Yes    Birth control/ protection: Other-see comments, Surgical     Comment: Tubal Ligation   Other Topics Concern  . Not on file   Social History Narrative   Separated since June 2016 - she has two children at home . The youngest is his daughter. Working at [employer] in [place] through a [employer]     Current Outpatient Prescriptions:  .  rizatriptan (MAXALT) 10 MG tablet, Take 10 mg by mouth as needed., Disp: , Rfl:  .  topiramate (TOPAMAX) 100 MG tablet, , Disp: , Rfl:   No Known Allergies   ROS  Constitutional: Negative for fever, positive for weight change.  Respiratory: Negative for cough and shortness of breath.   Cardiovascular: Negative for chest pain or palpitations.  Gastrointestinal: Negative for abdominal pain, no bowel changes.  Musculoskeletal: Negative for gait problem or joint swelling.  Skin: Negative for rash.  Neurological: Negative for dizziness , positive for occasional  headache.  No other  specific complaints in a complete review of systems (except as listed in HPI above).  Objective  Vitals:   01/11/16 1459  BP: 118/82  Pulse: 82  Resp: 18  Temp: 98.3 F (36.8 C)  TempSrc: Oral  SpO2: 99%  Weight: 188 lb 4.8 oz (85.4 kg)    Body mass index is 30.39 kg/m.  Physical Exam  Constitutional: Patient appears well-developed and well-nourished. Obese  No distress.  HEENT: head atraumatic, normocephalic, pupils equal and reactive to light,  neck supple, throat within normal limits, thyroid cyst ( enlargement on right side - but US showed normal size in March 2017 ) Cardiovascular: Normal rate, regular rhythm and normal heart sounds.   No murmur heard. No BLE edema. Pulmonary/Chest: Effort normal and breath sounds normal. No respiratory distress. Abdominal: Soft.  There is no tenderness. Psychiatric: Patient has a normal mood and affect. behavior is normal. Judgment and thought content normal.  PHQ2/9: Depression screen [facility] 2/9 01/11/2016 07/26/2015 04/22/2015  Decreased Interest 0 0 0  Down, Depressed, Hopeless 0 0 0  PHQ - 2 Score 0 0 0    Fall Risk: Fall Risk  01/11/2016 07/26/2015 04/22/2015  Falls in the past year? No No No    Functional Status Survey: Is the patient deaf or have difficulty hearing?: No Does the patient have difficulty seeing, even when wearing glasses/contacts?: No Does the patient have difficulty concentrating, remembering, or making decisions?: No Does the patient have difficulty walking or climbing stairs?: No Does the patient have difficulty dressing or bathing?: No Does the patient have difficulty doing errands alone such as visiting a doctor's office or shopping?: No    Assessment & Plan  1. Obesity (BMI 30.0-34.9)  Discussed with the patient the risk posed by an increased BMI. Discussed importance of portion control, calorie counting and at least 150 minutes of physical activity weekly. Avoid sweet beverages and drink more water. Eat at least 6 servings of fruit and vegetables daily  Discussed all optiosns for weight loss medications including Belviq, Qsymia, Saxenda and Contrave. Discussed risk and benefits of each of them. Since she sees Dr. Jalene Mullet for headaches, explained that right now I can only start her on Saxenda, unless he approves of the other medications. Also explained of the MEN syndrome ( and reminded her of her thyroid cyst ) but she would like to try medication anyways - Liraglutide -Weight Management (SAXENDA) 18 MG/3ML SOPN; Inject 3.6 mg into the skin daily.  Dispense: 12 mL; Refill: 0  2. Vitamin D deficiency  - Cholecalciferol (VITAMIN D) 2000 units CAPS; Take 1  capsule (2,000 Units total) by mouth daily.  Dispense: 30 capsule; Refill: 0 -vitamin D level  3. Edema, unspecified type  Happened once after she ate pork and pickles, but resolved since.  -TSH -CBC -Comp panel   4. Migraine with aura and without status migrainosus, not intractable  Migraines were controled when taking Depo and would like to resume it, taking Topamax but could not go higher on the dose because of fatigue and mental fogginess, got lost going to work, currently on 100 mg qhs,   5. Depo contraception  - medroxyPROGESTERone (DEPO-PROVERA) 150 MG/ML injection; Inject 1 mL (150 mg total) into the muscle every 3 (three) months.  Dispense: 1 mL; Refill: 3

## 2016-01-12 LAB — THYROID PANEL WITH TSH
Free Thyroxine Index: 1.9 (ref 1.4–3.8)
T3 Uptake: 29 % (ref 22–35)
T4, Total: 6.7 ug/dL (ref 4.5–12.0)
TSH: 1.6 mIU/L

## 2016-01-12 LAB — VITAMIN D 25 HYDROXY (VIT D DEFICIENCY, FRACTURES): Vit D, 25-Hydroxy: 24 ng/mL — ABNORMAL LOW (ref 30–100)

## 2016-01-19 ENCOUNTER — Telehealth: Payer: Self-pay | Admitting: Family Medicine

## 2016-01-19 NOTE — Telephone Encounter (Signed)
done

## 2016-01-19 NOTE — Telephone Encounter (Signed)
She can resume the Depo. Get a pregnancy test when she comes in

## 2016-01-20 ENCOUNTER — Ambulatory Visit: Payer: Commercial Managed Care - HMO

## 2016-01-20 ENCOUNTER — Ambulatory Visit (INDEPENDENT_AMBULATORY_CARE_PROVIDER_SITE_OTHER): Payer: Commercial Managed Care - HMO

## 2016-01-20 DIAGNOSIS — Z308 Encounter for other contraceptive management: Secondary | ICD-10-CM | POA: Diagnosis not present

## 2016-01-20 DIAGNOSIS — Z3042 Encounter for surveillance of injectable contraceptive: Secondary | ICD-10-CM

## 2016-01-20 LAB — POCT URINE PREGNANCY: Preg Test, Ur: NEGATIVE

## 2016-01-20 MED ORDER — MEDROXYPROGESTERONE ACETATE 150 MG/ML IM SUSY
150.0000 mg | PREFILLED_SYRINGE | Freq: Once | INTRAMUSCULAR | Status: AC
Start: 2016-01-20 — End: 2016-01-20
  Administered 2016-01-20: 150 mg via INTRAMUSCULAR

## 2016-01-20 NOTE — Telephone Encounter (Signed)
Patient notified and is coming in for Pregnancy test and Depo.

## 2016-02-22 ENCOUNTER — Encounter: Payer: Self-pay | Admitting: Family Medicine

## 2016-02-22 ENCOUNTER — Ambulatory Visit (INDEPENDENT_AMBULATORY_CARE_PROVIDER_SITE_OTHER): Payer: Commercial Managed Care - HMO | Admitting: Family Medicine

## 2016-02-22 VITALS — BP 116/86 | HR 88 | Temp 99.4°F | Wt 164.4 lb

## 2016-02-22 DIAGNOSIS — E669 Obesity, unspecified: Secondary | ICD-10-CM

## 2016-02-22 DIAGNOSIS — G43109 Migraine with aura, not intractable, without status migrainosus: Secondary | ICD-10-CM

## 2016-02-22 MED ORDER — LIRAGLUTIDE -WEIGHT MANAGEMENT 18 MG/3ML ~~LOC~~ SOPN: 3.0000 mg | PEN_INJECTOR | Freq: Every day | SUBCUTANEOUS | 2 refills | Status: DC

## 2016-02-22 NOTE — Progress Notes (Signed)
Name: Tammy Garza   MRN: [id number]    DOB: 10/20/1980   Date:02/22/2016       Progress Note  Subjective  Chief Complaint  Chief Complaint  Patient presents with  . Follow-up  . Obesity    Patient has lost 24 pounds since last visit.     HPI  Obesity: she was started on Saxenda August 2017 and is doing great on medication. She has lost 24 lbs since August. She has been eating healthier, exercising. She denies nausea or vomiting, but she feels full and needs to eat small portions. No side effects. Able to give herself a shot and not complaints, happy that she has lost so much weight.   Migraine: doing better, seeing Dr. Domingo Cocking and is doing well on Topamax, but she states she start foggy minded and did not like the paresthesia so she stopped medication. However she went back on Depo in August and not migraines since.    Patient Active Problem List   Diagnosis Date Noted  . Vitamin D deficiency 07/26/2015  . Thyroid cyst 07/26/2015  . HPV test positive 04/22/2015  . History of Helicobacter pylori infection 04/22/2015  . Migraine with aura and without status migrainosus 04/22/2015  . Shift work sleep disorder 04/22/2015    Past Surgical History:  Procedure Laterality Date  . CHOLECYSTECTOMY    . TUBAL LIGATION      Family History  Problem Relation Age of Onset  . HIV Sister   . Asthma Daughter   . Hypertension Maternal Grandmother   . Hypertension Mother     Social History   Social History  . Marital status: Married    Spouse name: N/A  . Number of children: N/A  . Years of education: N/A   Occupational History  . Not on file.   Social History Main Topics  . Smoking status: Former Research scientist (life sciences)  . Smokeless tobacco: Never Used  . Alcohol use Yes  . Drug use: No  . Sexual activity: Yes    Birth control/ protection: Other-see comments, Surgical     Comment: Tubal Ligation   Other Topics Concern  . Not on file   Social History Narrative   Separated since June  2016 - she has two children at home . The youngest is his daughter. Working at [employer] in [place] through a [employer]     Current Outpatient Prescriptions:  .  Cholecalciferol (VITAMIN D) 2000 units CAPS, Take 1 capsule (2,000 Units total) by mouth daily., Disp: 30 capsule, Rfl: 0 .  Insulin Pen Needle (NOVOFINE PLUS) 32G X 4 MM MISC, 1 each by Does not apply route daily., Disp: 100 each, Rfl: 1 .  Liraglutide -Weight Management (SAXENDA) 18 MG/3ML SOPN, Inject 3 mg into the skin daily., Disp: 18 mL, Rfl: 2 .  medroxyPROGESTERone (DEPO-PROVERA) 150 MG/ML injection, Inject 1 mL (150 mg total) into the muscle every 3 (three) months., Disp: 1 mL, Rfl: 3 .  rizatriptan (MAXALT) 10 MG tablet, Take 10 mg by mouth as needed., Disp: , Rfl:   No Known Allergies   ROS  Ten systems reviewed and is negative except as mentioned in HPI   Objective  Vitals:   02/22/16 0918  BP: 116/86  Pulse: 88  Temp: 99.4 F (37.4 C)  SpO2: 98%  Weight: 164 lb 6.4 oz (74.6 kg)    Body mass index is 26.53 kg/m.  Physical Exam  Constitutional: Patient appears well-developed and well-nourished. Overweight   No distress.  HEENT: head atraumatic, normocephalic, pupils equal and reactive to light, neck supple, throat within normal limits Cardiovascular: Normal rate, regular rhythm and normal heart sounds.  No murmur heard. No BLE edema. Pulmonary/Chest: Effort normal and breath sounds normal. No respiratory distress. Abdominal: Soft.  There is no tenderness. Psychiatric: Patient has a normal mood and affect. behavior is normal. Judgment and thought content normal.  Recent Results (from the past 2160 hour(s))  CBC with Differential/Platelet     Status: None   Collection Time: 01/11/16  3:33 PM  Result Value Ref Range   WBC 8.5 3.8 - 10.8 K/uL   RBC 4.09 3.80 - 5.10 MIL/uL   Hemoglobin 12.2 11.7 - 15.5 g/dL   HCT 37.6 35.0 - 45.0 %   MCV 91.9 80.0 - 100.0 fL   MCH 29.8 27.0 - 33.0 pg   MCHC 32.4  32.0 - 36.0 g/dL   RDW 12.9 11.0 - 15.0 %   Platelets 276 140 - 400 K/uL   MPV 11.2 7.5 - 12.5 fL   Neutro Abs 4,590 1,500 - 7,800 cells/uL   Lymphs Abs 2,890 850 - 3,900 cells/uL   Monocytes Absolute 765 200 - 950 cells/uL   Eosinophils Absolute 255 15 - 500 cells/uL   Basophils Absolute 0 0 - 200 cells/uL   Neutrophils Relative % 54 %   Lymphocytes Relative 34 %   Monocytes Relative 9 %   Eosinophils Relative 3 %   Basophils Relative 0 %   Smear Review Criteria for review not met   COMPLETE METABOLIC PANEL WITH GFR     Status: None   Collection Time: 01/11/16  3:33 PM  Result Value Ref Range   Sodium 139 135 - 146 mmol/L   Potassium 4.6 3.5 - 5.3 mmol/L   Chloride 107 98 - 110 mmol/L   CO2 28 20 - 31 mmol/L   Glucose, Bld 97 65 - 99 mg/dL   BUN 8 7 - 25 mg/dL   Creat 0.85 0.50 - 1.10 mg/dL   Total Bilirubin 0.4 0.2 - 1.2 mg/dL   Alkaline Phosphatase 47 33 - 115 U/L   AST 12 10 - 30 U/L   ALT 11 6 - 29 U/L   Total Protein 7.0 6.1 - 8.1 g/dL   Albumin 4.1 3.6 - 5.1 g/dL   Calcium 9.4 8.6 - 10.2 mg/dL   GFR, Est African American >89 >=60 mL/min   GFR, Est Non African American 88 >=60 mL/min  Thyroid Panel With TSH     Status: None   Collection Time: 01/11/16  3:51 PM  Result Value Ref Range   T4, Total 6.7 4.5 - 12.0 ug/dL   T3 Uptake 29 22 - 35 %   Free Thyroxine Index 1.9 1.4 - 3.8   TSH 1.60 mIU/L    Comment:   Reference Range   > or = 20 Years  0.40-4.50   Pregnancy Range First trimester  0.26-2.66 Second trimester 0.55-2.73 Third trimester  0.43-2.91     VITAMIN D 25 Hydroxy (Vit-D Deficiency, Fractures)     Status: Abnormal   Collection Time: 01/11/16  3:51 PM  Result Value Ref Range   Vit D, 25-Hydroxy 24 (L) 30 - 100 ng/mL    Comment: Vitamin D Status           25-OH Vitamin D        Deficiency                <20 ng/mL  Insufficiency         20 - 29 ng/mL        Optimal             > or = 30 ng/mL   For 25-OH Vitamin D testing on patients on  D2-supplementation and patients for whom quantitation of D2 and D3 fractions is required, the [facility] 25-OH VIT D, (D2,D3), LC/MS/MS is recommended: order code [id number] (patients > 2 yrs).   POCT urine pregnancy     Status: Normal   Collection Time: 01/20/16 10:08 AM  Result Value Ref Range   Preg Test, Ur Negative Negative      PHQ2/9: Depression screen [facility] 2/9 02/22/2016 01/11/2016 07/26/2015 04/22/2015  Decreased Interest 0 0 0 0  Down, Depressed, Hopeless 0 0 0 0  PHQ - 2 Score 0 0 0 0     Fall Risk: Fall Risk  02/22/2016 01/11/2016 07/26/2015 04/22/2015  Falls in the past year? No No No No     Functional Status Survey: Is the patient deaf or have difficulty hearing?: No Does the patient have difficulty seeing, even when wearing glasses/contacts?: Yes (glasses) Does the patient have difficulty concentrating, remembering, or making decisions?: No Does the patient have difficulty walking or climbing stairs?: No Does the patient have difficulty dressing or bathing?: No Does the patient have difficulty doing errands alone such as visiting a doctor's office or shopping?: No    Assessment & Plan  1. Migraine with aura and without status migrainosus, not intractable  Doing well, since resumed Depo   2. Obesity (BMI 30.0-34.9)  Great response , follow in 3 months, her goal weight is 155 lbs, and advised her to back down on dose if she gets below 155 lbs - Liraglutide -Weight Management (SAXENDA) 18 MG/3ML SOPN; Inject 3 mg into the skin daily.  Dispense: 18 mL; Refill: 2

## 2016-03-21 ENCOUNTER — Ambulatory Visit: Payer: Commercial Managed Care - HMO

## 2016-04-19 ENCOUNTER — Ambulatory Visit (INDEPENDENT_AMBULATORY_CARE_PROVIDER_SITE_OTHER): Payer: Commercial Managed Care - HMO

## 2016-04-19 DIAGNOSIS — Z309 Encounter for contraceptive management, unspecified: Secondary | ICD-10-CM | POA: Diagnosis not present

## 2016-04-19 MED ORDER — MEDROXYPROGESTERONE ACETATE 150 MG/ML IM SUSY
150.0000 mg | PREFILLED_SYRINGE | INTRAMUSCULAR | Status: AC
  Administered 2016-04-19: 150 mg via INTRAMUSCULAR

## 2016-05-24 ENCOUNTER — Ambulatory Visit: Payer: Commercial Managed Care - HMO | Admitting: Family Medicine

## 2016-06-14 ENCOUNTER — Encounter: Payer: Self-pay | Admitting: Family Medicine

## 2016-06-14 ENCOUNTER — Ambulatory Visit (INDEPENDENT_AMBULATORY_CARE_PROVIDER_SITE_OTHER): Payer: Commercial Managed Care - HMO | Admitting: Family Medicine

## 2016-06-14 VITALS — HR 74 | Temp 98.7°F | Resp 16 | Ht 66.0 in | Wt 167.7 lb

## 2016-06-14 DIAGNOSIS — E559 Vitamin D deficiency, unspecified: Secondary | ICD-10-CM

## 2016-06-14 DIAGNOSIS — E669 Obesity, unspecified: Secondary | ICD-10-CM | POA: Diagnosis not present

## 2016-06-14 DIAGNOSIS — G43109 Migraine with aura, not intractable, without status migrainosus: Secondary | ICD-10-CM

## 2016-06-14 DIAGNOSIS — G4726 Circadian rhythm sleep disorder, shift work type: Secondary | ICD-10-CM

## 2016-06-14 MED ORDER — LIRAGLUTIDE -WEIGHT MANAGEMENT 18 MG/3ML ~~LOC~~ SOPN: 3.0000 mg | PEN_INJECTOR | Freq: Every day | SUBCUTANEOUS | 3 refills | Status: DC

## 2016-06-14 MED ORDER — INSULIN PEN NEEDLE 32G X 4 MM MISC: 1.0000 | Freq: Every day | 1 refills | Status: DC

## 2016-06-14 MED ORDER — VITAMIN D (ERGOCALCIFEROL) 1.25 MG (50000 UNIT) PO CAPS: 50000.0000 [IU] | ORAL_CAPSULE | ORAL | 0 refills | Status: DC

## 2016-06-14 NOTE — Progress Notes (Signed)
Name: Tammy Garza   MRN: [id number]    DOB: 1980/08/29   Date:06/14/2016       Progress Note  Subjective  Chief Complaint  Chief Complaint  Patient presents with  . Follow-up    3 month f/u  . Migraine    sx has decreased since taking the Depo. only about 3-4 headaches per mth  . Obesity    patient has been doing well on the Saxenda  . Medication Refill    ? vitamin D    HPI  Obesity: she was started on Saxenda August 2017 and was doing great, she had lost 24 lbs by October 2017, she ran out of [place] a couple of weeks ago, therefore she has gained a few pounds since. However she feels like Saxenda curbs her appetite and she wants to continue medication.  She has been eating healthier, exercising. She denies nausea or vomiting, but she feels full and needs to eat small portions. No side effects.   Migraine: doing better, seeing Dr. Domingo Cocking and was doing well on Topamax, but she states she start foggy minded and did not like the paresthesia so she stopped medication. However she went back on Depo in August and frequency has gone down, but still has 3-4 episodes per month. Keep follow up with neurologist   Vitamin D deficiency: she would like to resume rx vitamin D to be able to take it daily, she has been feeling more tired lately   Patient Active Problem List   Diagnosis Date Noted  . Vitamin D deficiency 07/26/2015  . Thyroid cyst 07/26/2015  . HPV test positive 04/22/2015  . History of Helicobacter pylori infection 04/22/2015  . Migraine with aura and without status migrainosus 04/22/2015  . Shift work sleep disorder 04/22/2015    Past Surgical History:  Procedure Laterality Date  . CHOLECYSTECTOMY    . TUBAL LIGATION      Family History  Problem Relation Age of Onset  . HIV Sister   . Cancer Sister 9    cervical cancer  . Asthma Daughter   . Hypertension Maternal Grandmother   . Hypertension Mother   . Obesity Mother   . Cancer Father 23    lung cancer  .  COPD Paternal Grandmother     Social History   Social History  . Marital status: Married    Spouse name: N/A  . Number of children: N/A  . Years of education: N/A   Occupational History  . Not on file.   Social History Main Topics  . Smoking status: Former Smoker    Packs/day: 0.25    Years: 5.00    Types: Cigarettes    Quit date: 05/15/2012  . Smokeless tobacco: Never Used  . Alcohol use 0.6 oz/week    1 Glasses of wine per week  . Drug use: No  . Sexual activity: Yes    Partners: Male    Birth control/ protection: Other-see comments, Surgical     Comment: Tubal Ligation   Other Topics Concern  . Not on file   Social History Narrative   Separated since June 2016 - she has two children at home . The youngest is his daughter. Working at [employer] in [place]. She works at [employer]. She had been laid off by back since 2017.    Dating Gwyndolyn Saxon since 2015     Current Outpatient Prescriptions:  .  Insulin Pen Needle (NOVOFINE PLUS) 32G X 4 MM MISC, 1 each by  Does not apply route daily., Disp: 100 each, Rfl: 1 .  Liraglutide -Weight Management (SAXENDA) 18 MG/3ML SOPN, Inject 3 mg into the skin daily., Disp: 18 mL, Rfl: 3 .  medroxyPROGESTERone (DEPO-PROVERA) 150 MG/ML injection, Inject 1 mL (150 mg total) into the muscle every 3 (three) months., Disp: 1 mL, Rfl: 3 .  rizatriptan (MAXALT) 10 MG tablet, Take 10 mg by mouth as needed., Disp: , Rfl:  .  Cholecalciferol (VITAMIN D) 2000 units CAPS, Take 1 capsule (2,000 Units total) by mouth daily. (Patient not taking: Reported on 06/14/2016), Disp: 30 capsule, Rfl: 0  No Known Allergies   ROS  Constitutional: Negative for fever or weight change.  Respiratory: Negative for cough and shortness of breath.   Cardiovascular: Negative for chest pain or palpitations.  Gastrointestinal: Negative for abdominal pain, no bowel changes.  Musculoskeletal: Negative for gait problem or joint swelling.  Skin: Negative for rash.   Neurological: Negative for dizziness, positive for  headache.  No other specific complaints in a complete review of systems (except as listed in HPI above).  Objective  Vitals:   06/14/16 0914  Pulse: 74  Resp: 16  Temp: 98.7 F (37.1 C)  TempSrc: Oral  SpO2: 95%  Weight: 167 lb 11.2 oz (76.1 kg)  Height: 5\' 6"  (1.676 m)    Body mass index is 27.07 kg/m.  Physical Exam  Constitutional: Patient appears well-developed and well-nourished. Overweight   No distress.  HEENT: head atraumatic, normocephalic, pupils equal and reactive to light,  neck supple, throat within normal limits Cardiovascular: Normal rate, regular rhythm and normal heart sounds.  No murmur heard. No BLE edema. Pulmonary/Chest: Effort normal and breath sounds normal. No respiratory distress. Abdominal: Soft.  There is no tenderness. Psychiatric: Patient has a normal mood and affect. behavior is normal. Judgment and thought content normal.   PHQ2/9: Depression screen [facility] 2/9 06/14/2016 02/22/2016 01/11/2016 07/26/2015 04/22/2015  Decreased Interest 0 0 0 0 0  Down, Depressed, Hopeless 0 0 0 0 0  PHQ - 2 Score 0 0 0 0 0     Fall Risk: Fall Risk  06/14/2016 02/22/2016 01/11/2016 07/26/2015 04/22/2015  Falls in the past year? No No No No No     Functional Status Survey: Is the patient deaf or have difficulty hearing?: No Does the patient have difficulty seeing, even when wearing glasses/contacts?: No Does the patient have difficulty concentrating, remembering, or making decisions?: No Does the patient have difficulty walking or climbing stairs?: No Does the patient have difficulty dressing or bathing?: No Does the patient have difficulty doing errands alone such as visiting a doctor's office or shopping?: No    Assessment & Plan  1. Migraine with aura and without status migrainosus, not intractable   2. Obesity (BMI 30.0-34.9)  - Liraglutide -Weight Management (SAXENDA) 18 MG/3ML SOPN; Inject 3 mg  into the skin daily.  Dispense: 18 mL; Refill: 3 - Insulin Pen Needle (NOVOFINE PLUS) 32G X 4 MM MISC; 1 each by Does not apply route daily.  Dispense: 100 each; Refill: 1  3. Vitamin D deficiency  - Vitamin D, Ergocalciferol, (DRISDOL) 50000 units CAPS capsule; Take 1 capsule (50,000 Units total) by mouth every 7 (seven) days.  Dispense: 12 capsule; Refill: 0  4. Shift work sleep disorder  She is feeling tired, works third shift and gets about 5 hours of sleep after work and tries to nap in the evening before going back to work - occasionally, she does not want to  have labs to check on fatigue at this time, but would like to resume rx vitamin D, she has been trying to catch up on sleep on weekends.

## 2016-06-21 DIAGNOSIS — G43019 Migraine without aura, intractable, without status migrainosus: Secondary | ICD-10-CM | POA: Diagnosis not present

## 2016-06-21 DIAGNOSIS — G43719 Chronic migraine without aura, intractable, without status migrainosus: Secondary | ICD-10-CM | POA: Diagnosis not present

## 2016-07-06 ENCOUNTER — Encounter: Payer: Self-pay | Admitting: Family Medicine

## 2016-07-06 ENCOUNTER — Ambulatory Visit (INDEPENDENT_AMBULATORY_CARE_PROVIDER_SITE_OTHER): Payer: Commercial Managed Care - HMO | Admitting: Family Medicine

## 2016-07-06 VITALS — BP 104/58 | HR 77 | Temp 98.2°F | Resp 16 | Ht 66.0 in | Wt 162.5 lb

## 2016-07-06 DIAGNOSIS — R1030 Lower abdominal pain, unspecified: Secondary | ICD-10-CM

## 2016-07-06 DIAGNOSIS — N898 Other specified noninflammatory disorders of vagina: Secondary | ICD-10-CM | POA: Diagnosis not present

## 2016-07-06 LAB — POCT WET PREP (WET MOUNT): Trichomonas Wet Prep HPF POC: ABSENT

## 2016-07-06 MED ORDER — FLUCONAZOLE 150 MG PO TABS: 150.0000 mg | ORAL_TABLET | ORAL | 0 refills | Status: DC

## 2016-07-06 NOTE — Progress Notes (Signed)
Name: Tammy Garza   MRN: [id number]    DOB: [birth year]   Date:07/06/2016       Progress Note  Subjective  Chief Complaint  Chief Complaint  Patient presents with  . Vaginal Discharge    Onset-3 days, white mucus, vaginal odor. States she had a bubble bath a couple of days ago and doesn't know if that is what trigger it. Painful sex since having discharge and odor.     HPI  Vaginal discharge: she took a bath 5 days ago and since than she has noticed mild vaginal discharge and discomfort with intercourse. She has the same sexual partner, mild supra pubic discomfort. She has a history of BV. No fever, nausea or change in appetite  Patient Active Problem List   Diagnosis Date Noted  . Vitamin D deficiency 07/26/2015  . Thyroid cyst 07/26/2015  . HPV test positive 04/22/2015  . History of Helicobacter pylori infection 04/22/2015  . Migraine with aura and without status migrainosus 04/22/2015  . Shift work sleep disorder 04/22/2015    Past Surgical History:  Procedure Laterality Date  . CHOLECYSTECTOMY    . TUBAL LIGATION      Family History  Problem Relation Age of Onset  . HIV Sister   . Cancer Sister 89    cervical cancer  . Asthma Daughter   . Hypertension Maternal Grandmother   . Hypertension Mother   . Obesity Mother   . Cancer Father 83    lung cancer  . COPD Paternal Grandmother     Social History   Social History  . Marital status: Married    Spouse name: N/A  . Number of children: N/A  . Years of education: N/A   Occupational History  . Not on file.   Social History Main Topics  . Smoking status: Former Smoker    Packs/day: 0.25    Years: 5.00    Types: Cigarettes    Quit date: 05/15/2012  . Smokeless tobacco: Never Used  . Alcohol use 0.6 oz/week    1 Glasses of wine per week  . Drug use: No  . Sexual activity: Yes    Partners: Male    Birth control/ protection: Other-see comments, Surgical     Comment: Tubal Ligation   Other Topics Concern   . Not on file   Social History Narrative   Separated since June 2016 - she has two children at home . The youngest is his daughter. Working at [employer] in [place]. She works at [employer]. She had been laid off by back since 2017.    Dating Gwyndolyn Saxon since 2015     Current Outpatient Prescriptions:  .  Insulin Pen Needle (NOVOFINE PLUS) 32G X 4 MM MISC, 1 each by Does not apply route daily., Disp: 100 each, Rfl: 1 .  Liraglutide -Weight Management (SAXENDA) 18 MG/3ML SOPN, Inject 3 mg into the skin daily., Disp: 18 mL, Rfl: 3 .  medroxyPROGESTERone (DEPO-PROVERA) 150 MG/ML injection, Inject 1 mL (150 mg total) into the muscle every 3 (three) months., Disp: 1 mL, Rfl: 3 .  rizatriptan (MAXALT) 10 MG tablet, Take 10 mg by mouth as needed., Disp: , Rfl:  .  Vitamin D, Ergocalciferol, (DRISDOL) 50000 units CAPS capsule, Take 1 capsule (50,000 Units total) by mouth every 7 (seven) days., Disp: 12 capsule, Rfl: 0 .  fluconazole (DIFLUCAN) 150 MG tablet, Take 1 tablet (150 mg total) by mouth every other day., Disp: 3 tablet, Rfl: 0  No Known  Allergies   ROS  Ten systems reviewed and is negative except as mentioned in HPI   Objective  Vitals:   07/06/16 1128  BP: (!) 104/58  Pulse: 77  Resp: 16  Temp: 98.2 F (36.8 C)  TempSrc: Oral  SpO2: 99%  Weight: 162 lb 8 oz (73.7 kg)  Height: 5\' 6"  (1.676 m)    Body mass index is 26.23 kg/m.  Physical Exam  Constitutional: Patient appears well-developed and well-nourished. Obese  No distress.  HEENT: head atraumatic, normocephalic, pupils equal and reactive to light,  neck supple, throat within normal limits Cardiovascular: Normal rate, regular rhythm and normal heart sounds.  No murmur heard. No BLE edema. Pulmonary/Chest: Effort normal and breath sounds normal. No respiratory distress. Abdominal: Soft.  There is no tenderness. Psychiatric: Patient has a normal mood and affect. behavior is normal. Judgment and thought content  normal. Pelvic : very mild white discharge, cervical oz is normal no bimanual motion tenderness  PHQ2/9: Depression screen [facility] 2/9 06/14/2016 02/22/2016 01/11/2016 07/26/2015 04/22/2015  Decreased Interest 0 0 0 0 0  Down, Depressed, Hopeless 0 0 0 0 0  PHQ - 2 Score 0 0 0 0 0     Fall Risk: Fall Risk  06/14/2016 02/22/2016 01/11/2016 07/26/2015 04/22/2015  Falls in the past year? No No No No No     Assessment & Plan  1. Vaginal discharge  She will call back for  - POCT Wet Prep (Wet [place]) - GC/Chlamydia Probe Amp - fluconazole (DIFLUCAN) 150 MG tablet; Take 1 tablet (150 mg total) by mouth every other day.  Dispense: 3 tablet; Refill: 0  2. Vaginal odor  - fluconazole (DIFLUCAN) 150 MG tablet; Take 1 tablet (150 mg total) by mouth every other day.  Dispense: 3 tablet; Refill: 0  3. Abdominal pain, lower  - Urine Culture

## 2016-07-07 LAB — GC/CHLAMYDIA PROBE AMP
CT Probe RNA: NOT DETECTED
GC Probe RNA: NOT DETECTED

## 2016-07-08 LAB — URINE CULTURE

## 2016-07-18 ENCOUNTER — Ambulatory Visit (INDEPENDENT_AMBULATORY_CARE_PROVIDER_SITE_OTHER): Payer: Commercial Managed Care - HMO

## 2016-07-18 DIAGNOSIS — Z3042 Encounter for surveillance of injectable contraceptive: Secondary | ICD-10-CM

## 2016-07-18 MED ORDER — MEDROXYPROGESTERONE ACETATE 150 MG/ML IM SUSY
150.0000 mg | PREFILLED_SYRINGE | INTRAMUSCULAR | Status: AC
  Administered 2016-07-18: 150 mg via INTRAMUSCULAR

## 2016-08-22 ENCOUNTER — Ambulatory Visit (INDEPENDENT_AMBULATORY_CARE_PROVIDER_SITE_OTHER): Payer: Commercial Managed Care - HMO | Admitting: Family Medicine

## 2016-08-22 ENCOUNTER — Encounter: Payer: Self-pay | Admitting: Family Medicine

## 2016-08-22 ENCOUNTER — Other Ambulatory Visit: Payer: Self-pay | Admitting: Family Medicine

## 2016-08-22 VITALS — BP 118/68 | HR 82 | Temp 98.5°F | Resp 16 | Ht 66.0 in | Wt 161.0 lb

## 2016-08-22 DIAGNOSIS — E669 Obesity, unspecified: Secondary | ICD-10-CM

## 2016-08-22 DIAGNOSIS — R87810 Cervical high risk human papillomavirus (HPV) DNA test positive: Secondary | ICD-10-CM | POA: Diagnosis not present

## 2016-08-22 DIAGNOSIS — Z01419 Encounter for gynecological examination (general) (routine) without abnormal findings: Secondary | ICD-10-CM

## 2016-08-22 DIAGNOSIS — Z0001 Encounter for general adult medical examination with abnormal findings: Secondary | ICD-10-CM | POA: Diagnosis not present

## 2016-08-22 DIAGNOSIS — Z8639 Personal history of other endocrine, nutritional and metabolic disease: Secondary | ICD-10-CM

## 2016-08-22 DIAGNOSIS — K640 First degree hemorrhoids: Secondary | ICD-10-CM | POA: Diagnosis not present

## 2016-08-22 DIAGNOSIS — Z124 Encounter for screening for malignant neoplasm of cervix: Secondary | ICD-10-CM | POA: Diagnosis not present

## 2016-08-22 MED ORDER — LIRAGLUTIDE -WEIGHT MANAGEMENT 18 MG/3ML ~~LOC~~ SOPN: 3.0000 mg | PEN_INJECTOR | Freq: Every day | SUBCUTANEOUS | 3 refills | Status: DC

## 2016-08-22 MED ORDER — HYDROCORTISONE ACETATE 25 MG RE SUPP: 25.0000 mg | Freq: Two times a day (BID) | RECTAL | 0 refills | Status: DC

## 2016-08-22 NOTE — Progress Notes (Signed)
Name: Tammy Garza   MRN: [id number]    DOB: 07-09-1980   Date:08/22/2016       Progress Note  Subjective  Chief Complaint  Chief Complaint  Patient presents with  . Annual Exam    HPI  Well Woman: doing well, no longer having vaginal problems, no pain during intercourse, denies breast lumps. Denies urinary frequency or urgency. She has noticed some bright blood in toilette paper when she wipes.  Hemorrhoids:  She states it started after she strained to have a bowel movements a few weeks ago. She has noticed some bright blood in toilette paper when she wipes. She has noticed bulging of hemorrhoids. She had external hemorrhoids after pregnancy and feels the same way. No blood mixed in stools, no abdominal pain. She tried preparation H with some improvement of symptoms and has increased fiber intake. Stools no longer hard.   Obesity: she was started on Saxenda August 2017 and was doing great, she had lost 24 lbs by October 2017, she got as low as 154 lbs but she felt like she was too skinny and decreased dose of medication ( down to 2.4 daily), she states when she stopped medication her appetite increased quickly and she gained weight. .   She has been eating healthier, exercising. She denies nausea or vomiting, but she feels full and needs to eat small portions. No side effects.    Patient Active Problem List   Diagnosis Date Noted  . Vitamin D deficiency 07/26/2015  . Thyroid cyst 07/26/2015  . Pap smear of cervix shows high risk HPV present 04/22/2015  . History of Helicobacter pylori infection 04/22/2015  . Migraine with aura and without status migrainosus 04/22/2015  . Shift work sleep disorder 04/22/2015    Past Surgical History:  Procedure Laterality Date  . CHOLECYSTECTOMY    . TUBAL LIGATION      Family History  Problem Relation Age of Onset  . HIV Sister   . Cancer Sister 5    cervical cancer  . Asthma Daughter   . Hypertension Maternal Grandmother   . Hypertension  Mother   . Obesity Mother   . Cancer Father 65    lung cancer  . COPD Paternal Grandmother     Social History   Social History  . Marital status: Married    Spouse name: N/A  . Number of children: N/A  . Years of education: N/A   Occupational History  . Not on file.   Social History Main Topics  . Smoking status: Former Smoker    Packs/day: 0.25    Years: 5.00    Types: Cigarettes    Quit date: 05/15/2012  . Smokeless tobacco: Never Used  . Alcohol use 0.6 oz/week    1 Glasses of wine per week  . Drug use: No  . Sexual activity: Yes    Partners: Male    Birth control/ protection: Other-see comments, Surgical     Comment: Tubal Ligation   Other Topics Concern  . Not on file   Social History Narrative   Separated since June 2016 - she has two children at home . The youngest is his daughter. Working at [employer] in [place]. She works at [employer]. She had been laid off by back since 2017.    Dating Gwyndolyn Saxon since 2015     Current Outpatient Prescriptions:  .  Insulin Pen Needle (NOVOFINE PLUS) 32G X 4 MM MISC, 1 each by Does not apply route daily., Disp:  100 each, Rfl: 1 .  Liraglutide -Weight Management (SAXENDA) 18 MG/3ML SOPN, Inject 3 mg into the skin daily., Disp: 18 mL, Rfl: 3 .  medroxyPROGESTERone (DEPO-PROVERA) 150 MG/ML injection, Inject 1 mL (150 mg total) into the muscle every 3 (three) months., Disp: 1 mL, Rfl: 3 .  rizatriptan (MAXALT) 10 MG tablet, Take 10 mg by mouth as needed., Disp: , Rfl:  .  Vitamin D, Ergocalciferol, (DRISDOL) 50000 units CAPS capsule, Take 1 capsule (50,000 Units total) by mouth every 7 (seven) days., Disp: 12 capsule, Rfl: 0  No Known Allergies   ROS  Constitutional: Negative for fever or significant weight change.  Respiratory: Negative for cough and shortness of breath.   Cardiovascular: Negative for chest pain or palpitations.  Gastrointestinal: Negative for abdominal pain, no bowel changes.  Musculoskeletal:  Negative for gait problem or joint swelling.  Skin: Negative for rash.  Neurological: Negative for dizziness , positive for intermittent headaches.  No other specific complaints in a complete review of systems (except as listed in HPI above).  Objective  Vitals:   08/22/16 0818  BP: 118/68  Pulse: 82  Resp: 16  Temp: 98.5 F (36.9 C)  SpO2: 98%  Weight: 161 lb (73 kg)  Height: 5\' 6"  (1.676 m)    Body mass index is 25.99 kg/m.  Physical Exam  Constitutional: Patient appears well-developed and well-nourished. No distress.  HENT: Head: Normocephalic and atraumatic. Ears: B TMs ok, no erythema or effusion; Nose: Nose normal. Mouth/Throat: Oropharynx is clear and moist. No oropharyngeal exudate.  Eyes: Conjunctivae and EOM are normal. Pupils are equal, round, and reactive to light. No scleral icterus.  Neck: Normal range of motion. Neck supple. No JVD present. No thyromegaly present.  Cardiovascular: Normal rate, regular rhythm and normal heart sounds.  No murmur heard. No BLE edema. Pulmonary/Chest: Effort normal and breath sounds normal. No respiratory distress. Abdominal: Soft. Bowel sounds are normal, no distension. There is no tenderness. no masses Breast: no lumps or masses, no nipple discharge or rashes FEMALE GENITALIA:  External genitalia normal External urethra normal Vaginal mild white dischargedischarge , no  lesions Cervix normal without discharge or lesions Bimanual exam normal without masses RECTAL: no rectal masses, she has anal skin tags, and internal hemorrhoids on exam, did not come out with valsalva maneuver Musculoskeletal: Normal range of motion, no joint effusions. No gross deformities Neurological: he is alert and oriented to person, place, and time. No cranial nerve deficit. Coordination, balance, strength, speech and gait are normal.  Skin: Skin is warm and dry. No rash noted. No erythema.  Psychiatric: Patient has a normal mood and affect. behavior is  normal. Judgment and thought content normal.  Recent Results (from the past 2160 hour(s))  GC/Chlamydia Probe Amp     Status: None   Collection Time: 07/06/16 12:08 PM  Result Value Ref Range   CT Probe RNA NOT DETECTED     Comment:                    **Normal Reference Range: NOT DETECTED**   This test was performed using the APTIMA COMBO2 Assay ([place].).   The analytical performance characteristics of this assay, when used to test SurePath specimens have been determined by [facility]      GC Probe RNA NOT DETECTED     Comment:                    **Normal Reference Range: NOT DETECTED**  This test was performed using the [place] ([place]   The analytical performance characteristics of this assay, when used to test SurePath specimens have been determined by [facility]     POCT Wet Prep [facility] [place])     Status: Abnormal   Collection Time: 07/06/16 12:22 PM  Result Value Ref Range   Source Wet Prep POC vaginal    WBC, Wet Prep HPF POC few    Bacteria Wet Prep HPF POC  Few   BACTERIA WET PREP MORPHOLOGY POC     Clue Cells Wet Prep HPF POC Few (A) None   Clue Cells Wet Prep Whiff POC     Yeast Wet Prep HPF POC Many    KOH Wet Prep POC     Trichomonas Wet Prep HPF POC Absent Absent  Urine Culture     Status: None   Collection Time: 07/06/16 12:32 PM  Result Value Ref Range   Organism ID, Bacteria      Multiple organisms present,each less than 10,000 CFU/mL. These organisms,commonly found on external and internal genitalia,are considered colonizers. No further testing performed.      PHQ2/9: Depression screen [facility] 2/9 08/22/2016 06/14/2016 02/22/2016 01/11/2016 07/26/2015  Decreased Interest 0 0 0 0 0  Down, Depressed, Hopeless 0 0 0 0 0  PHQ - 2 Score 0 0 0 0 0    Fall Risk: Fall Risk  08/22/2016 06/14/2016 02/22/2016 01/11/2016 07/26/2015  Falls in the past year? No No No No No     Functional Status Survey: Is the  patient deaf or have difficulty hearing?: No Does the patient have difficulty seeing, even when wearing glasses/contacts?: No Does the patient have difficulty concentrating, remembering, or making decisions?: No Does the patient have difficulty walking or climbing stairs?: No Does the patient have difficulty dressing or bathing?: No Does the patient have difficulty doing errands alone such as visiting a doctor's office or shopping?: No    Assessment & Plan  1. Well woman exam  Discussed importance of 150 minutes of physical activity weekly, eat two servings of fish weekly, eat one serving of tree nuts ( cashews, pistachios, pecans, almonds.Marland Kitchen) every other day, eat 6 servings of fruit/vegetables daily and drink plenty of water and avoid sweet beverages.   2. Cervical cancer screening   3. Pap smear of cervix shows high risk HPV present  - Pap IG and HPV (high risk) DNA detection  4. Obesity (BMI 30.0-34.9)  - Liraglutide -Weight Management (SAXENDA) 18 MG/3ML SOPN; Inject 3 mg into the skin daily.  Dispense: 18 mL; Refill: 3  5. History of obesity  Current only overweight, responding well to medication  - Liraglutide -Weight Management (SAXENDA) 18 MG/3ML SOPN; Inject 3 mg into the skin daily.  Dispense: 18 mL; Refill: 3  6. Grade I hemorrhoids  - hydrocortisone (ANUSOL-HC) 25 MG suppository; Place 1 suppository (25 mg total) rectally 2 (two) times daily.  Dispense: 12 suppository; Refill: 0

## 2016-08-25 LAB — PAP IG AND HPV HIGH-RISK: HPV DNA High Risk: NOT DETECTED

## 2016-09-12 DIAGNOSIS — I82409 Acute embolism and thrombosis of unspecified deep veins of unspecified lower extremity: Secondary | ICD-10-CM

## 2016-09-12 HISTORY — DX: Acute embolism and thrombosis of unspecified deep veins of unspecified lower extremity: I82.409

## 2016-09-17 ENCOUNTER — Emergency Department: Payer: 59

## 2016-09-17 ENCOUNTER — Emergency Department
Admission: EM | Admit: 2016-09-17 | Discharge: 2016-09-17 | Disposition: A | Discharge status: Home / Self Care | Payer: 59 | Attending: Emergency Medicine | Admitting: Emergency Medicine

## 2016-09-17 DIAGNOSIS — M79605 Pain in left leg: Secondary | ICD-10-CM

## 2016-09-17 DIAGNOSIS — I82442 Acute embolism and thrombosis of left tibial vein: Secondary | ICD-10-CM | POA: Diagnosis not present

## 2016-09-17 DIAGNOSIS — I82402 Acute embolism and thrombosis of unspecified deep veins of left lower extremity: Secondary | ICD-10-CM | POA: Insufficient documentation

## 2016-09-17 DIAGNOSIS — I82492 Acute embolism and thrombosis of other specified deep vein of left lower extremity: Secondary | ICD-10-CM | POA: Diagnosis not present

## 2016-09-17 DIAGNOSIS — I825Z2 Chronic embolism and thrombosis of unspecified deep veins of left distal lower extremity: Secondary | ICD-10-CM | POA: Insufficient documentation

## 2016-09-17 DIAGNOSIS — Z87891 Personal history of nicotine dependence: Secondary | ICD-10-CM | POA: Insufficient documentation

## 2016-09-17 DIAGNOSIS — G43809 Other migraine, not intractable, without status migrainosus: Secondary | ICD-10-CM

## 2016-09-17 DIAGNOSIS — I824Z2 Acute embolism and thrombosis of unspecified deep veins of left distal lower extremity: Secondary | ICD-10-CM | POA: Diagnosis not present

## 2016-09-17 LAB — CBC
HCT: 34.6 % — ABNORMAL LOW (ref 35.0–47.0)
Hemoglobin: 12 g/dL (ref 12.0–16.0)
MCH: 31.5 pg (ref 26.0–34.0)
MCHC: 34.6 g/dL (ref 32.0–36.0)
MCV: 91.1 fL (ref 80.0–100.0)
Platelets: 219 10*3/uL (ref 150–440)
RBC: 3.8 MIL/uL (ref 3.80–5.20)
RDW: 13.4 % (ref 11.5–14.5)
WBC: 10.3 10*3/uL (ref 3.6–11.0)

## 2016-09-17 LAB — BASIC METABOLIC PANEL
Anion gap: 8 (ref 5–15)
BUN: 9 mg/dL (ref 6–20)
CO2: 27 mmol/L (ref 22–32)
Calcium: 8.9 mg/dL (ref 8.9–10.3)
Chloride: 104 mmol/L (ref 101–111)
Creatinine, Ser: 0.83 mg/dL (ref 0.44–1.00)
GFR calc Af Amer: >60 mL/min (ref >60)
GFR calc non Af Amer: >60 mL/min (ref >60)
Glucose, Bld: 92 mg/dL (ref 65–99)
Potassium: 3.9 mmol/L (ref 3.5–5.1)
Sodium: 139 mmol/L (ref 135–145)

## 2016-09-17 LAB — POCT PREGNANCY, URINE: Preg Test, Ur: NEGATIVE

## 2016-09-17 LAB — APTT: aPTT: 26 seconds (ref 24–36)

## 2016-09-17 LAB — PROTIME-INR
INR: 0.95
Prothrombin Time: 12.7 seconds (ref 11.4–15.2)

## 2016-09-17 MED ORDER — ACETAMINOPHEN 500 MG PO TABS
1000.0000 mg | ORAL_TABLET | Freq: Once | ORAL | Status: AC
  Administered 2016-09-17: 1000 mg via ORAL
  Filled 2016-09-17: qty 2

## 2016-09-17 MED ORDER — APIXABAN 5 MG PO TABS: ORAL_TABLET | ORAL | 0 refills | Status: DC

## 2016-09-17 MED ORDER — APIXABAN 5 MG PO TABS
10.0000 mg | ORAL_TABLET | Freq: Once | ORAL | Status: AC
  Administered 2016-09-17: 10 mg via ORAL
  Filled 2016-09-17: qty 2

## 2016-09-17 MED ORDER — KETOROLAC TROMETHAMINE 30 MG/ML IJ SOLN: 60.0000 mg | Freq: Once | INTRAMUSCULAR | Status: DC

## 2016-09-17 NOTE — ED Provider Notes (Signed)
[facility] Emergency Department Provider Note  ____________________________________________  Time seen: Approximately 9:23 PM  I have reviewed the triage vital signs and the nursing notes.   HISTORY  Chief Complaint Leg Pain    HPI Tammy Garza is a 36 y.o. female with a family history of blood clots presenting with left lower extremity pain since April 26. The patient reports that after a flight on April 26, the patient developed medial and posterior left calf pain. Initially, the pain was relieved with walking, but now walking exacerbates it. The pain has also moved to the posterior and lateral calf at this time. She has not had any chest pain or shortness of breath, lightheadedness or syncope. Unrelated to her leg complaint, the patient also has a history of migraines and took Maxalt, her usual migraine medication one hour prior to arrival. At this time, she states that her headache has significantly improved and is now "just a plain headache."   Past Medical History:  Diagnosis Date  . Cervical high risk human papillomavirus (HPV) DNA test positive   . Chronic gastritis   . Depression   . H. pylori infection   . History of attempted suicide   . History of gastric polyp   . Migraines   . Rash   . Shift work sleep disorder     Patient Active Problem List   Diagnosis Date Noted  . Vitamin D deficiency 07/26/2015  . Thyroid cyst 07/26/2015  . Pap smear of cervix shows high risk HPV present 04/22/2015  . History of Helicobacter pylori infection 04/22/2015  . Migraine with aura and without status migrainosus 04/22/2015  . Shift work sleep disorder 04/22/2015    Past Surgical History:  Procedure Laterality Date  . CHOLECYSTECTOMY    . TUBAL LIGATION      Current Outpatient Rx  . Order #: [id number] Class: Print  . Order #: [id number] Class: Normal  . Order #: [id number] Class: Normal  . Order #: [id number] Class: Normal  . Order #: [id number] Class:  Normal  . Order #: [id number] Class: Historical Med  . Order #: [id number] Class: Normal    Allergies Patient has no known allergies.  Family History  Problem Relation Age of Onset  . HIV Sister   . Cancer Sister 59    cervical cancer  . Asthma Daughter   . Hypertension Maternal Grandmother   . Hypertension Mother   . Obesity Mother   . Cancer Father 1    lung cancer  . COPD Paternal Grandmother     Social History Social History  Substance Use Topics  . Smoking status: Former Smoker    Packs/day: 0.25    Years: 5.00    Types: Cigarettes    Quit date: 05/15/2012  . Smokeless tobacco: Never Used  . Alcohol use 0.6 oz/week    1 Glasses of wine per week    Review of Systems Constitutional: No fever/chills.No lightheadedness or syncope. Eyes: No visual changes. No blurred or double vision. ENT:  No congestion or rhinorrhea. Cardiovascular: Denies chest pain. Denies palpitations. Respiratory: Denies shortness of breath.  No cough. Gastrointestinal: No abdominal pain.  No nausea, no vomiting.  No diarrhea.  No constipation. Genitourinary: Negative for dysuria. Musculoskeletal: Negative for back pain. Positive for left lower extremity pain. Skin: Negative for rash. Neurological: Positive for migraine. No focal numbness, tingling or weakness.   10-point ROS otherwise negative.  ____________________________________________   PHYSICAL EXAM:  VITAL SIGNS: ED Triage Vitals  Enc Vitals Group  BP 09/17/16 2002 119/79     Pulse Rate 09/17/16 2002 82     Resp 09/17/16 2002 18     Temp 09/17/16 2002 99 F (37.2 C)     Temp Source 09/17/16 2002 Oral     SpO2 09/17/16 2002 97 %     Weight 09/17/16 2003 165 lb (74.8 kg)     Height 09/17/16 2003 5\' 6"  (1.676 m)     Head Circumference --      Peak Flow --      Pain Score 09/17/16 2002 10     Pain Loc --      Pain Edu? --      Excl. in [place]? --     Constitutional: Alert and oriented. Well appearing and in no acute  distress. Answers questions appropriately. Eyes: Conjunctivae are normal.  EOMI. No scleral icterus. Head: Atraumatic. Nose: No congestion/rhinnorhea. Mouth/Throat: Mucous membranes are moist.  Neck: No stridor.  Supple.  No meningismus. Cardiovascular: Normal rate, regular rhythm. No murmurs, rubs or gallops.  Respiratory: Normal respiratory effort.  No accessory muscle use or retractions. Lungs CTAB.  No wheezes, rales or ronchi. Musculoskeletal: No LE edema. Positive tenderness to palpation in the posterior distal calf and posterior distal lateral calf without palpable cords on the left. Negative Homans sign. DP and PT pulses are normal bilaterally. Normal sensation to light touch. Normal dorsiflexion and plantar flexion as well as quadriceps and hamstring strength bilaterally. Neurologic:  A&Ox3.  Speech is clear.  Face and smile are symmetric.  EOMI.  Moves all extremities well. Skin:  Skin is warm, dry and intact. No rash noted. Psychiatric: Mood and affect are normal. Speech and behavior are normal.  Normal judgement.  ____________________________________________   LABS (all labs ordered are listed, but only abnormal results are displayed)  Labs Reviewed  CBC - Abnormal; Notable for the following:       Result Value   HCT 34.6 (*)    All other components within normal limits  BASIC METABOLIC PANEL  PROTIME-INR  APTT  POC URINE PREG, ED  POCT PREGNANCY, URINE   ____________________________________________  EKG  Not indicated ____________________________________________  RADIOLOGY  US Venous Img Lower Unilateral Left  Result Date: 09/17/2016 CLINICAL DATA:  Left leg pain since an airplane flight to [place] on April 26th. EXAM: Left LOWER EXTREMITY VENOUS DOPPLER ULTRASOUND TECHNIQUE: Gray-scale sonography with graded compression, as well as color Doppler and duplex ultrasound were performed to evaluate the lower extremity deep venous systems from the level of the common  femoral vein and including the common femoral, femoral, profunda femoral, popliteal and calf veins including the posterior tibial, peroneal and gastrocnemius veins when visible. The superficial great saphenous vein was also interrogated. Spectral Doppler was utilized to evaluate flow at rest and with distal augmentation maneuvers in the common femoral, femoral and popliteal veins. COMPARISON:  None. FINDINGS: Contralateral Common Femoral Vein: Respiratory phasicity is normal and symmetric with the symptomatic side. No evidence of thrombus. Normal compressibility. Common Femoral Vein: No evidence of thrombus. Normal compressibility, respiratory phasicity and response to augmentation. Saphenofemoral Junction: No evidence of thrombus. Normal compressibility and flow on color Doppler imaging. Profunda Femoral Vein: No evidence of thrombus. Normal compressibility and flow on color Doppler imaging. Femoral Vein: No evidence of thrombus. Normal compressibility, respiratory phasicity and response to augmentation. Popliteal Vein: No evidence of thrombus. Normal compressibility, respiratory phasicity and response to augmentation. Calf Veins: Acute appearing echogenic thrombus is demonstrated in the peroneal and posterior tibial  veins. Venous expansion and noncompressibility is noted. No flow on color flow Doppler imaging. There appears to be duplication of the peroneal vein with both branches thrombosed. Superficial Great Saphenous Vein: No evidence of thrombus. Normal compressibility and flow on color Doppler imaging. Venous Reflux:  None. Other Findings:  None. IMPRESSION: Acute appearing thrombus is demonstrated in the calf veins of the left lower extremity, with involvement of posterior tibial and peroneal veins. These results were called by telephone at the time of interpretation on 09/17/2016 at 9:37 pm to Dr. Mariea Clonts , who verbally acknowledged these results. Electronically Signed   By: Lucienne Capers M.D.   On:  09/17/2016 21:40    ____________________________________________   PROCEDURES  Procedure(s) performed: None  Procedures  Critical Care performed: No ____________________________________________   INITIAL IMPRESSION / ASSESSMENT AND PLAN / ED COURSE  Pertinent labs & imaging results that were available during my care of the patient were reviewed by me and considered in my medical decision making (see chart for details).  36 y.o. female with a family history for blood clots presenting with left lower extremity pain after a long flight on April 26. Overall, the patient's lower extremity exam is reassuring but will get an ultrasound to evaluate for DVT. I do not see any evidence of musculoskeletal, ligamentous or tendon injury. There are no neurologic deficits in the left lower extremity. The patient's migraine is improving and I will treat her headache with Tylenol. Plan reevaluation for final disposition.  ----------------------------------------- 10:17 PM on 09/17/2016 -----------------------------------------  The patient does have a DVT in the peroneal and posterior tibial veins; she does not have any DVT above the knee, but the radiologist does note a significant clot burden. I've spoken with Dr. Mike Gip, the hematologist on-call, who will see the patient is an outpatient. She recommends discharge with Eliquis. I'll give the patient the first dose here, and she will be discharged. We have discussed the risks and benefits of anticoagulation, and she understands return precautions as well as follow-up instructions.   ____________________________________________  FINAL CLINICAL IMPRESSION(S) / ED DIAGNOSES  Final diagnoses:  Acute deep vein thrombosis (DVT) of distal end of left lower extremity (HCC)  Other migraine without status migrainosus, not intractable         NEW MEDICATIONS STARTED DURING THIS VISIT:  New Prescriptions   APIXABAN (ELIQUIS) 5 MG TABS TABLET     10mg  by mouth twice daily for 7 days, then 5mg  twice daily.      Eula Listen, MD 09/17/16 2218

## 2016-09-17 NOTE — Discharge Instructions (Signed)
Today have a blood clot in the deep veins appear left leg. The treatment for this is a blood thinner medication called Eliquis.  Eliquis Wilson your blood but does increase the risk of bleeding. If you develop lightheadedness or shortness of breath, dark or bloody stool, or any other bleeding, please stop this medication and talk to your doctor immediately. Do not take other blood thinner medications, including NSAID medications such as Motrin or ibuprofen, when you're taking Eliquis.  It is okay to take Tylenol with Eliquis.  Return to the emergency department if you develop severe pain, chest pain, shortness of breath, lightheadedness or fainting, or any other symptoms concerning to you

## 2016-09-17 NOTE — ED Triage Notes (Signed)
Pt states that she has been having left lower pain since April 26th, states that she had a 2 hour flight to [place] that day. Pt also states headache that started an hour ago, pt reports hx of migraines and has taken a maxalt an hour ago, but states this headache feels a little different, pt also mentions that her father has a hx of blood clots, no swelling noted to the left lower leg, palpable pedal pulse to left foot, cap refill within normal limits, skin warm and dry to touch

## 2016-09-18 ENCOUNTER — Encounter: Payer: Self-pay | Admitting: Family Medicine

## 2016-09-18 ENCOUNTER — Other Ambulatory Visit: Payer: Self-pay | Admitting: Family Medicine

## 2016-09-18 ENCOUNTER — Ambulatory Visit (INDEPENDENT_AMBULATORY_CARE_PROVIDER_SITE_OTHER): Payer: Commercial Managed Care - HMO | Admitting: Family Medicine

## 2016-09-18 VITALS — BP 98/58 | HR 105 | Temp 98.4°F | Resp 16 | Ht 66.0 in | Wt 167.5 lb

## 2016-09-18 DIAGNOSIS — I824Z2 Acute embolism and thrombosis of unspecified deep veins of left distal lower extremity: Secondary | ICD-10-CM | POA: Diagnosis not present

## 2016-09-18 DIAGNOSIS — G43109 Migraine with aura, not intractable, without status migrainosus: Secondary | ICD-10-CM

## 2016-09-18 DIAGNOSIS — E559 Vitamin D deficiency, unspecified: Secondary | ICD-10-CM

## 2016-09-18 MED ORDER — HYDROCODONE-ACETAMINOPHEN 5-325 MG PO TABS: 1.0000 | ORAL_TABLET | Freq: Four times a day (QID) | ORAL | 0 refills | Status: DC | PRN

## 2016-09-18 NOTE — Patient Instructions (Addendum)
Deep Vein Thrombosis A deep vein thrombosis (DVT) is a blood clot (thrombus) that usually occurs in a deep, larger vein of the lower leg or the pelvis, or in an upper extremity such as the arm. These are dangerous and can lead to serious and even life-threatening complications if the clot travels to the lungs. A DVT can damage the valves in your leg veins so that instead of flowing upward, the blood pools in the lower leg. This is called post-thrombotic syndrome, and it can result in pain, swelling, discoloration, and sores on the leg. What are the causes? A DVT is caused by the formation of a blood clot in your leg, pelvis, or arm. Usually, several things contribute to the formation of blood clots. A clot may develop when:  Your blood flow slows down.  Your vein becomes damaged in some way.  You have a condition that makes your blood clot more easily.  What increases the risk? A DVT is more likely to develop in:  People who are older, especially over 60 years of age.  People who are overweight (obese).  People who sit or lie still for a long time, such as during long-distance travel (over 4 hours), bed rest, hospitalization, or during recovery from certain medical conditions like a stroke.  People who do not engage in much physical activity (sedentary lifestyle).  People who have chronic breathing disorders.  People who have a personal or family history of blood clots or blood clotting disease.  People who have peripheral vascular disease (PVD), diabetes, or some types of cancer.  People who have heart disease, especially if the person had a recent heart attack or has congestive heart failure.  People who have neurological diseases that affect the legs (leg paresis).  People who have had a traumatic injury, such as breaking a hip or leg.  People who have recently had major or lengthy surgery, especially on the hip, knee, or abdomen.  People who have had a central line placed  inside a large vein.  People who take medicines that contain the hormone estrogen. These include birth control pills and hormone replacement therapy.  Pregnancy or during childbirth or the postpartum period.  Long plane flights (over 8 hours).  What are the signs or symptoms?  Symptoms of a DVT can include:  Swelling of your leg or arm, especially if one side is much worse.  Warmth and redness of your leg or arm, especially if one side is much worse.  Pain in your arm or leg. If the clot is in your leg, symptoms may be more noticeable or worse when you stand or walk.  A feeling of pins and needles, if the clot is in the arm.  The symptoms of a DVT that has traveled to the lungs (pulmonary embolism, PE) usually start suddenly and include:  Shortness of breath while active or at rest.  Coughing or coughing up blood or blood-tinged mucus.  Chest pain that is often worse with deep breaths.  Rapid or irregular heartbeat.  Feeling light-headed or dizzy.  Fainting.  Feeling anxious.  Sweating.  There may also be pain and swelling in a leg if that is where the blood clot started. These symptoms may represent a serious problem that is an emergency. Do not wait to see if the symptoms will go away. Get medical help right away. Call your local emergency services (911 in the [place] Do not drive yourself to the hospital. How is this diagnosed? Your health   care provider will take a medical history and perform a physical exam. You may also have other tests, including:  Blood tests to assess the clotting properties of your blood.  Imaging tests, such as CT, ultrasound, MRI, X-ray, and other tests to see if you have clots anywhere in your body.  How is this treated? After a DVT is identified, it can be treated. The type of treatment that you receive depends on many factors, such as the cause of your DVT, your risk for bleeding or developing more clots, and other medical conditions that  you have. Sometimes, a combination of treatments is necessary. Treatment options may be combined and include:  Monitoring the blood clot with ultrasound.  Taking medicines by mouth, such as newer blood thinners (anticoagulants), thrombolytics, or warfarin.  Taking anticoagulant medicine by injection or through an IV tube.  Wearing compression stockings or using different types ofdevices.  Surgery (rare) to remove the blood clot or to place a filter in your abdomen to stop the blood clot from traveling to your lungs.  Treatments for a DVT are often divided into immediate treatment and long-term treatment (up to 3 months after DVT). You can work with your health care provider to choose the treatment program that is best for you. Follow these instructions at home: If you are taking a newer oral anticoagulant:  Take the medicine every single day at the same time each day.  Understand what foods and drugs interact with this medicine.  Understand that there are no regular blood tests required when using this medicine.  Understand the side effects of this medicine, including excessive bruising or bleeding. Ask your health care provider or pharmacist about other possible side effects. If you are taking warfarin:  Understand how to take warfarin and know which foods can affect how warfarin works in your body.  Understand that it is dangerous to take too much or too little warfarin. Too much warfarin increases the risk of bleeding. Too little warfarin continues to allow the risk for blood clots.  Follow your PT and INR blood testing schedule. The PT and INR results allow your health care provider to adjust your dose of warfarin. It is very important that you have your PT and INR tested as often as told by your health care provider.  Avoid major changes in your diet, or tell your health care provider before you change your diet. Arrange a visit with a registered dietitian to answer your  questions. Many foods, especially foods that are high in vitamin K, can interfere with warfarin and affect the PT and INR results. Eat a consistent amount of foods that are high in vitamin K, such as: ? Spinach, kale, broccoli, cabbage, collard greens, turnip greens, Brussels sprouts, peas, cauliflower, seaweed, and parsley. ? Beef liver and pork liver. ? Green tea. ? Soybean oil.  Tell your health care provider about any and all medicines, vitamins, and supplements that you take, including aspirin and other over-the-counter anti-inflammatory medicines. Be especially cautious with aspirin and anti-inflammatory medicines. Do not take those before you ask your health care provider if it is safe to do so. This is important because many medicines can interfere with warfarin and affect the PT and INR results.  Do not start or stop taking any over-the-counter or prescription medicine unless your health care provider or pharmacist tells you to do so. If you take warfarin, you will also need to do these things:  Hold pressure over cuts for longer than   usual.  Tell your dentist and other health care providers that you are taking warfarin before you have any procedures in which bleeding may occur.  Avoid alcohol or drink very small amounts. Tell your health care provider if you change your alcohol intake.  Do not use tobacco products, including cigarettes, chewing tobacco, and e-cigarettes. If you need help quitting, ask your health care provider.  Avoid contact sports.  General instructions  Take over-the-counter and prescription medicines only as told by your health care provider. Anticoagulant medicines can have side effects, including easy bruising and difficulty stopping bleeding. If you are prescribed an anticoagulant, you will also need to do these things: ? Hold pressure over cuts for longer than usual. ? Tell your dentist and other health care providers that you are taking anticoagulants  before you have any procedures in which bleeding may occur. ? Avoid contact sports.  Wear a medical alert bracelet or carry a medical alert card that says you have had a PE.  Ask your health care provider how soon you can go back to your normal activities. Stay active to prevent new blood clots from forming.  Make sure to exercise while traveling or when you have been sitting or standing for a long period of time. It is very important to exercise. Exercise your legs by walking or by tightening and relaxing your leg muscles often. Take frequent walks.  Wear compression stockings as told by your health care provider to help prevent more blood clots from forming.  Do not use tobacco products, including cigarettes, chewing tobacco, and e-cigarettes. If you need help quitting, ask your health care provider.  Keep all follow-up appointments with your health care provider. This is important. How is this prevented? Take these actions to decrease your risk of developing another DVT:  Exercise regularly. For at least 30 minutes every day, engage in: ? Activity that involves moving your arms and legs. ? Activity that encourages good blood flow through your body by increasing your heart rate.  Exercise your arms and legs every hour during long-distance travel (over 4 hours). Drink plenty of water and avoid drinking alcohol while traveling.  Avoid sitting or lying in bed for long periods of time without moving your legs.  Maintain a weight that is appropriate for your height. Ask your health care provider what weight is healthy for you.  If you are a woman who is over 35 years of age, avoid unnecessary use of medicines that contain estrogen. These include birth control pills.  Do not smoke, especially if you take estrogen medicines. If you need help quitting, ask your health care provider.  If you are hospitalized, prevention measures may include:  Early walking after surgery, as soon as your  health care provider says that it is safe.  Receiving anticoagulants to prevent blood clots.If you cannot take anticoagulants, other options may be available, such as wearing compression stockings or using different types of devices.  Get help right away if:  You have new or increased pain, swelling, or redness in an arm or leg.  You have numbness or tingling in an arm or leg.  You have shortness of breath while active or at rest.  You have chest pain.  You have a rapid or irregular heartbeat.  You feel light-headed or dizzy.  You cough up blood.  You notice blood in your vomit, bowel movement, or urine. These symptoms may represent a serious problem that is an emergency. Do not wait to see   if the symptoms will go away. Get medical help right away. Call your local emergency services (911 in the [place] Do not drive yourself to the hospital. This information is not intended to replace advice given to you by your health care provider. Make sure you discuss any questions you have with your health care provider. Document Released: 05/01/2005 Document Revised: 10/07/2015 Document Reviewed: 08/26/2014 Elsevier Interactive Patient Education  2017 [employer]  

## 2016-09-18 NOTE — Progress Notes (Signed)
Name: Tammy Garza   MRN: [id number]    DOB: 10/17/1980   Date:09/18/2016       Progress Note  Subjective  Chief Complaint  Chief Complaint  Patient presents with  . Hospitalization Follow-up    DVT in left leg  . DVT    Hospital gave patient a blood thinner and still hurting. Patient has an appointment with a vein and vascular doctor next Tuesday on the 15th.    HPI  Pt presents for hospital follow up s/p diagnosis of DVT to left calf. She was discharged from the ED last night and started on Eloquis. Discussed option to switch to Xarelto, but pt declines. She was referred to hematologist for follow up - she has appointment for 8 days from today. Is on Depo shot to help with migraines, had tubal ligation 07/02/2007. Denies shortness of breath or chest pain. Endorses left calf pain at 10/10, and she is tearful in office.  Has not been taking analgesics today. Father and Paternal GM both had DVT's. Results from ED LLE Venous Duplex as Follows: IMPRESSION: Acute appearing thrombus is demonstrated in the calf veins of the left lower extremity, with involvement of posterior tibial and peroneal veins. Electronically Signed   By: Lucienne Capers M.D.   On: 09/17/2016 21:40   Migraines: Pt is advised that Depo must be discontinued at this time. Advised we may refer her to Neurologist, but she prefers to see Dr. Orie Rout at [facility] first as she has an established relationship with him.  Patient Active Problem List   Diagnosis Date Noted  . Vitamin D deficiency 07/26/2015  . Thyroid cyst 07/26/2015  . Pap smear of cervix shows high risk HPV present 04/22/2015  . History of Helicobacter pylori infection 04/22/2015  . Migraine with aura and without status migrainosus 04/22/2015  . Shift work sleep disorder 04/22/2015    Social History  Substance Use Topics  . Smoking status: Former Smoker    Packs/day: 0.25    Years: 5.00    Types: Cigarettes    Quit date:  05/15/2012  . Smokeless tobacco: Never Used  . Alcohol use 0.6 oz/week    1 Glasses of wine per week     Current Outpatient Prescriptions:  .  apixaban (ELIQUIS) 5 MG TABS tablet, 1m by mouth twice daily for 7 days, then 522mtwice daily., Disp: 70 tablet, Rfl: 0 .  Insulin Pen Needle (NOVOFINE PLUS) 32G X 4 MM MISC, 1 each by Does not apply route daily., Disp: 100 each, Rfl: 1 .  Liraglutide -Weight Management (SAXENDA) 18 MG/3ML SOPN, Inject 3 mg into the skin daily., Disp: 18 mL, Rfl: 3 .  medroxyPROGESTERone (DEPO-PROVERA) 150 MG/ML injection, Inject 1 mL (150 mg total) into the muscle every 3 (three) months., Disp: 1 mL, Rfl: 3 .  rizatriptan (MAXALT) 10 MG tablet, Take 10 mg by mouth as needed., Disp: , Rfl:  .  Vitamin D, Ergocalciferol, (DRISDOL) 50000 units CAPS capsule, Take 1 capsule (50,000 Units total) by mouth every 7 (seven) days., Disp: 12 capsule, Rfl: 0  No Known Allergies  ROS Constitutional: Negative for fever or weight change.  Respiratory: Negative for cough and shortness of breath.   Cardiovascular: Negative for chest pain or palpitations.  Gastrointestinal: Negative for abdominal pain, no bowel changes.  Musculoskeletal: Negative for gait problem or joint swelling.  Skin: Negative for rash.  Neurological: Negative for dizziness or headache.  No other specific complaints in a complete review  of systems (except as listed in HPI above).  Objective  Vitals:   09/18/16 1523  BP: (!) 98/58  Pulse: (!) 105  Resp: 16  Temp: 98.4 F (36.9 C)  TempSrc: Oral  SpO2: 98%  Weight: 167 lb 8 oz (76 kg)  Height: 5' 6"  (1.676 m)    Body mass index is 27.04 kg/m.  Nursing Note and Vital Signs reviewed.  Physical Exam  Constitutional: Patient appears well-developed and well-nourished. Overweight No distress.  HEENT: head atraumatic, normocephalic Cardiovascular: Normal rate, regular rhythm, S1/S2 present.  No murmur or rub heard. No BLE edema. Pulmonary/Chest:  Effort normal and breath sounds clear. No respiratory distress or retractions. MSK: Left calf measures 42.5cm circumference, Right calf measures 41.5cm circumference. No heat or erythema. Significant tenderness to left calf on light touch. Psychiatric: Patient has a normal mood and affect. behavior is normal. Judgment and thought content normal.  Recent Results (from the past 2160 hour(s))  GC/Chlamydia Probe Amp     Status: None   Collection Time: 07/06/16 12:08 PM  Result Value Ref Range   CT Probe RNA NOT DETECTED     Comment:                    **Normal Reference Range: NOT DETECTED**   This test was performed using the APTIMA COMBO2 Assay ([place]   The analytical performance characteristics of this assay, when used to test SurePath specimens have been determined by [facility]      GC Probe RNA NOT DETECTED     Comment:                    **Normal Reference Range: NOT DETECTED**   This test was performed using the APTIMA COMBO2 Assay ([place]   The analytical performance characteristics of this assay, when used to test SurePath specimens have been determined by [facility]     POCT Wet Prep [facility] [place])     Status: Abnormal   Collection Time: 07/06/16 12:22 PM  Result Value Ref Range   Source Wet Prep POC vaginal    WBC, Wet Prep HPF POC few    Bacteria Wet Prep HPF POC  Few   BACTERIA WET PREP MORPHOLOGY POC     Clue Cells Wet Prep HPF POC Few (A) None   Clue Cells Wet Prep Whiff POC     Yeast Wet Prep HPF POC Many    KOH Wet Prep POC     Trichomonas Wet Prep HPF POC Absent Absent  Urine Culture     Status: None   Collection Time: 07/06/16 12:32 PM  Result Value Ref Range   Organism ID, Bacteria      Multiple organisms present,each less than 10,000 CFU/mL. These organisms,commonly found on external and internal genitalia,are considered colonizers. No further testing performed.   Pap IG and HPV (high risk) DNA detection      Status: None   Collection Time: 08/22/16  9:46 AM  Result Value Ref Range   HPV DNA High Risk Not Detected     Comment: HIGH RISK HPV types (16,18,31,33,35,39,45,51,52,56,58,59,66,68) were not detected. Other HPV types which cause anogenital lesions may be present. The significance of the other types of HPV in malignant  processes has not been established.                  ** Normal Reference Range: Not Detected **      HPV High Risk testing performed  using the APTIMA HPV mRNA Assay.      Specimen adequacy:      Comment: SATISFACTORY.  Endocervical/transformation zone component present.   FINAL DIAGNOSIS:      Comment: - NEGATIVE FOR INTRAEPITHELIAL LESIONS OR MALIGNANCY.    COMMENTS:      Comment: LMP (Last Menstrual Period) of the patient is not given. This Pap test has been evaluated with computer assisted technology.    Cytotechnologist:      Comment: JLT, BS CT(ASCP) *  The Pap is a screening test for cervical cancer. It is not a  diagnostic test and is subject to false negative and false positive  results. It is most reliable when a satisfactory sample, regularly  obtained, is submitted with relevant clinical findings and history,  and when the Pap result is evaluated along with historic and current  clinical information.   CBC     Status: Abnormal   Collection Time: 09/17/16  9:24 PM  Result Value Ref Range   WBC 10.3 3.6 - 11.0 K/uL   RBC 3.80 3.80 - 5.20 MIL/uL   Hemoglobin 12.0 12.0 - 16.0 g/dL   HCT 34.6 (L) 35.0 - 47.0 %   MCV 91.1 80.0 - 100.0 fL   MCH 31.5 26.0 - 34.0 pg   MCHC 34.6 32.0 - 36.0 g/dL   RDW 13.4 11.5 - 14.5 %   Platelets 219 150 - 440 K/uL  Basic metabolic panel     Status: None   Collection Time: 09/17/16  9:24 PM  Result Value Ref Range   Sodium 139 135 - 145 mmol/L   Potassium 3.9 3.5 - 5.1 mmol/L   Chloride 104 101 - 111 mmol/L   CO2 27 22 - 32 mmol/L   Glucose, Bld 92 65 - 99 mg/dL   BUN 9 6 - 20 mg/dL   Creatinine, Ser 0.83  0.44 - 1.00 mg/dL   Calcium 8.9 8.9 - 10.3 mg/dL   GFR calc non Af Amer >60 >60 mL/min   GFR calc Af Amer >60 >60 mL/min    Comment: (NOTE) The eGFR has been calculated using the CKD EPI equation. This calculation has not been validated in all clinical situations. eGFR's persistently <60 mL/min signify possible Chronic Kidney Disease.    Anion gap 8 5 - 15  Protime-INR     Status: None   Collection Time: 09/17/16  9:24 PM  Result Value Ref Range   Prothrombin Time 12.7 11.4 - 15.2 seconds   INR 0.95   APTT     Status: None   Collection Time: 09/17/16  9:24 PM  Result Value Ref Range   aPTT 26 24 - 36 seconds  Pregnancy, urine POC     Status: None   Collection Time: 09/17/16  9:58 PM  Result Value Ref Range   Preg Test, Ur NEGATIVE NEGATIVE    Comment:        THE SENSITIVITY OF THIS METHODOLOGY IS >24 mIU/mL      Assessment & Plan   1. Acute deep vein thrombosis (DVT) of distal end of left lower extremity (HCC) - HYDROcodone-acetaminophen (NORCO) 5-325 MG tablet; Take 1 tablet by mouth every 6 (six) hours as needed for moderate pain.  Dispense: 10 tablet; Refill: 0 -Discussed limiting Acetaminophen dose to <3031m/day -Note for work provided to be off until next Wednesday 09/27/16. She will RTC if this needs to be extended. She has an appointment with hematologist next Tuesday  2. Migraine with aura and without  status migrainosus, not intractable See Migraine Specialist in [place] as discussed Maxalt PRN for abortive migraine therapy.   -Red flags and when to present for emergency care or RTC including fever >101.64F, chest pain, shortness of breath, abnormal anxiety, new/worsening/un-resolving symptoms reviewed with patient at time of visit. Follow up and care instructions discussed and provided in AVS. -Reviewed Health Maintenance: UTD  I have reviewed this encounter including the documentation in this note and/or discussed this patient with the Johney Maine,  FNP, NP-C. I saw the patient with Raelyn Ensign, FNP, NP-C  [name], MD [facility] 09/19/2016, 7:49 AM

## 2016-09-19 ENCOUNTER — Emergency Department
Admission: EM | Admit: 2016-09-19 | Discharge: 2016-09-19 | Disposition: A | Discharge status: Home / Self Care | Payer: 59 | Attending: Emergency Medicine | Admitting: Emergency Medicine

## 2016-09-19 ENCOUNTER — Encounter: Payer: Self-pay | Admitting: Emergency Medicine

## 2016-09-19 ENCOUNTER — Telehealth: Payer: Self-pay | Admitting: Family Medicine

## 2016-09-19 DIAGNOSIS — R779 Abnormality of plasma protein, unspecified: Secondary | ICD-10-CM

## 2016-09-19 DIAGNOSIS — M79605 Pain in left leg: Secondary | ICD-10-CM | POA: Diagnosis present

## 2016-09-19 DIAGNOSIS — E8809 Other disorders of plasma-protein metabolism, not elsewhere classified: Secondary | ICD-10-CM

## 2016-09-19 DIAGNOSIS — I82492 Acute embolism and thrombosis of other specified deep vein of left lower extremity: Secondary | ICD-10-CM | POA: Diagnosis not present

## 2016-09-19 DIAGNOSIS — Z87891 Personal history of nicotine dependence: Secondary | ICD-10-CM | POA: Insufficient documentation

## 2016-09-19 DIAGNOSIS — Z794 Long term (current) use of insulin: Secondary | ICD-10-CM | POA: Insufficient documentation

## 2016-09-19 MED ORDER — CYCLOBENZAPRINE HCL 5 MG PO TABS: 5.0000 mg | ORAL_TABLET | Freq: Three times a day (TID) | ORAL | 0 refills | Status: DC | PRN

## 2016-09-19 MED ORDER — OXYCODONE-ACETAMINOPHEN 5-325 MG PO TABS: 1.0000 | ORAL_TABLET | Freq: Three times a day (TID) | ORAL | 0 refills | Status: DC | PRN

## 2016-09-19 NOTE — ED Notes (Signed)
Pt discharged to home.  Family member driving.  Discharge instructions reviewed.  Verbalized understanding.  No questions or concerns at this time.  Teach back verified.  Pt in NAD.  No items left in ED.   

## 2016-09-19 NOTE — Telephone Encounter (Signed)
Pt was seen yesterday and a note was written for her to return to work on Wednesday, however the note need to say returning Thursday. Is this okay if so please fax the note to [place] [phone]) [phone] Attn: Su Hoff

## 2016-09-19 NOTE — ED Provider Notes (Signed)
[facility] Emergency Department Provider Note ____________________________________________  Time seen: 30  I have reviewed the triage vital signs and the nursing notes.  HISTORY  Chief Complaint  Leg Pain  HPI Tammy Garza is a 36 y.o. female presents to the ED complaining of continued left lower leg pain. She was diagnosed here with a DVT on Sunday, etc. on Eliquis in the ED. She follow-up with her primary care provider yesterday and was placed on hydrocodone for pain. She describes the hydrocodone gives her drowsiness and limited pain relief. She reports pain continues at a 10/10 in triage. She does admit to increased pain to the left lower leg with attempts to walk and stand. She denies any chest pain, shortness of breath, or leg edema. She has been compliant with Eliquis as prescribed. She is scheduled to see the hematologist in one week for follow-up.  Past Medical History:  Diagnosis Date  . Cervical high risk human papillomavirus (HPV) DNA test positive   . Chronic gastritis   . Depression   . H. pylori infection   . History of attempted suicide   . History of gastric polyp   . Migraines   . Rash   . Shift work sleep disorder     Patient Active Problem List   Diagnosis Date Noted  . Vitamin D deficiency 07/26/2015  . Thyroid cyst 07/26/2015  . Pap smear of cervix shows high risk HPV present 04/22/2015  . History of Helicobacter pylori infection 04/22/2015  . Migraine with aura and without status migrainosus 04/22/2015  . Shift work sleep disorder 04/22/2015    Past Surgical History:  Procedure Laterality Date  . CHOLECYSTECTOMY    . TUBAL LIGATION      Prior to Admission medications   Medication Sig Start Date End Date Taking? Authorizing Provider  apixaban (ELIQUIS) 5 MG TABS tablet 10mg  by mouth twice daily for 7 days, then 5mg  twice daily. 09/17/16   Eula Listen, MD  cyclobenzaprine (FLEXERIL) 5 MG tablet Take 1 tablet (5 mg  total) by mouth 3 (three) times daily as needed for muscle spasms. 09/19/16   Darrik Richman, Dannielle Karvonen, PA-C  HYDROcodone-acetaminophen (NORCO) 5-325 MG tablet Take 1 tablet by mouth every 6 (six) hours as needed for moderate pain. 09/18/16   Hubbard Hartshorn, FNP  Insulin Pen Needle (NOVOFINE PLUS) 32G X 4 MM MISC 1 each by Does not apply route daily. 06/14/16   [name], MD  Liraglutide -Weight Management (SAXENDA) 18 MG/3ML SOPN Inject 3 mg into the skin daily. 08/22/16   [name], MD  oxyCODONE-acetaminophen (ROXICET) 5-325 MG tablet Take 1 tablet by mouth every 8 (eight) hours as needed. 09/19/16   Zarra Geffert, Dannielle Karvonen, PA-C  rizatriptan (MAXALT) 10 MG tablet Take 10 mg by mouth as needed.    [provider]  Vitamin D, Ergocalciferol, (DRISDOL) 50000 units CAPS capsule TAKE 1 CAPSULE (50,000 UNITS TOTAL) BY MOUTH EVERY 7 (SEVEN) DAYS. 09/19/16   [name], MD    Allergies Patient has no known allergies.  Family History  Problem Relation Age of Onset  . HIV Sister   . Cancer Sister 44    cervical cancer  . Asthma Daughter   . Hypertension Maternal Grandmother   . Hypertension Mother   . Obesity Mother   . Cancer Father 83    lung cancer  . Deep vein thrombosis Father   . COPD Paternal Grandmother   . Deep vein thrombosis Paternal Grandmother  Social History Social History  Substance Use Topics  . Smoking status: Former Smoker    Packs/day: 0.25    Years: 5.00    Types: Cigarettes    Quit date: 05/15/2012  . Smokeless tobacco: Never Used  . Alcohol use 0.6 oz/week    1 Glasses of wine per week    Review of Systems  Constitutional: Negative for fever. Cardiovascular: Negative for chest pain. LLE pain as above Respiratory: Negative for shortness of breath. Gastrointestinal: Negative for abdominal pain, vomiting and diarrhea. Musculoskeletal: Negative for back pain. Skin: Negative for rash. Neurological: Negative for headaches, focal weakness or  numbness. ____________________________________________  PHYSICAL EXAM:  VITAL SIGNS: ED Triage Vitals  Enc Vitals Group     BP 09/19/16 1810 117/77     Pulse Rate 09/19/16 1810 91     Resp 09/19/16 1810 20     Temp 09/19/16 1810 98.2 F (36.8 C)     Temp Source 09/19/16 1810 Oral     SpO2 09/19/16 1810 97 %     Weight --      Height --      Head Circumference --      Peak Flow --      Pain Score 09/19/16 1809 10     Pain Loc --      Pain Edu? --      Excl. in [place]? --     Constitutional: Alert and oriented. Well appearing and in no distress. Head: Normocephalic and atraumatic. Cardiovascular: Normal rate, regular rhythm. Normal distal pulses. Respiratory: Normal respiratory effort. No wheezes/rales/rhonchi. Musculoskeletal: Nontender with normal range of motion in all extremities.  Psychiatric: Mood and affect are normal. Patient exhibits appropriate insight and judgment. ____________________________________________  INITIAL IMPRESSION / ASSESSMENT AND PLAN / ED COURSE  Patient with confirmed left lower extremity DVT with reports of continued left leg pain. She is discharged at this time with a prescription for #10 oxycodone. She is also provided a prescription for Flexeril. She should dose the oxycodone for severe pain in the next few days. She should dose of Flexeril as directed. She is cautioned against drowsiness with both narcotics and muscle relaxants. She will follow up with Dr. Gwynneth Aliment or her hematologist Dr. Mike Gip next week as scheduled. Return precautions are reviewed. ____________________________________________  FINAL CLINICAL IMPRESSION(S) / ED DIAGNOSES  Final diagnoses:  Acute deep vein thrombosis (DVT) of other specified vein of left lower extremity ([place])      Camile Esters, Dannielle Karvonen, PA-C 09/19/16 2134    Carrie Mew, MD 09/23/16 1507

## 2016-09-19 NOTE — ED Notes (Signed)
See triage note,  No bruising or swelling noted to pt leg.

## 2016-09-19 NOTE — Telephone Encounter (Signed)
During lunch patient spoke with call a nurse and they told her to be seen within the next 4 hours. Stated that she is having a lot of leg pain. I spoke with nurse Bonnita Nasuti and she advised her to go to the ER so they can do additional testing. Patient verbalized understanding.

## 2016-09-19 NOTE — ED Triage Notes (Signed)
Pt to ed with c/o left lower leg pain.  States she was seen here and diagnosed with DVT on Sunday, started eloquis and given hydrocodone for pain, states pain continues, rates 10/10.  Worse with walking and standing.

## 2016-09-19 NOTE — Discharge Instructions (Signed)
You should take the narcotic pain medicine only as needed. Take the muscle relaxant as needed. Caution drowsiness with the combination of the medicines. Follow-up with your hematologist as scheduled, next week. Return to the ED as needed for symptoms as discussed.

## 2016-09-19 NOTE — Telephone Encounter (Signed)
Letter faxed to number given

## 2016-09-19 NOTE — Telephone Encounter (Signed)
This patient had called earlier for a note to return to work on Thursday. Did not give her a note since she had called with  Recurrent symptoms of leg pain. Patient was seen on Monday for hospital follow up DVT

## 2016-09-21 ENCOUNTER — Telehealth: Payer: Self-pay

## 2016-09-21 NOTE — Telephone Encounter (Signed)
Okay to extend time off

## 2016-09-21 NOTE — Telephone Encounter (Signed)
Patient states her job needs a note for being out of work from May 7th to the 17th. She has a note in the system from the 7th to the 10th. But had to go back to the ER due to the leg pain. Please fax letter to [employer] at [phone]. Then contact patient to make her aware of letter being faxed. Thanks.

## 2016-09-22 ENCOUNTER — Encounter: Payer: Self-pay | Admitting: Family Medicine

## 2016-09-22 NOTE — Telephone Encounter (Signed)
Letter faxed to [facility], her manager at work per patient request.

## 2016-09-26 ENCOUNTER — Inpatient Hospital Stay: Payer: 59 | Attending: Hematology and Oncology | Admitting: Hematology and Oncology

## 2016-09-26 ENCOUNTER — Inpatient Hospital Stay: Payer: 59

## 2016-09-26 VITALS — BP 111/77 | HR 96 | Temp 99.3°F | Resp 18 | Wt 166.5 lb

## 2016-09-26 DIAGNOSIS — E669 Obesity, unspecified: Secondary | ICD-10-CM | POA: Insufficient documentation

## 2016-09-26 DIAGNOSIS — Z7901 Long term (current) use of anticoagulants: Secondary | ICD-10-CM | POA: Diagnosis not present

## 2016-09-26 DIAGNOSIS — D6851 Activated protein C resistance: Secondary | ICD-10-CM | POA: Insufficient documentation

## 2016-09-26 DIAGNOSIS — I82402 Acute embolism and thrombosis of unspecified deep veins of left lower extremity: Secondary | ICD-10-CM | POA: Diagnosis not present

## 2016-09-26 DIAGNOSIS — F329 Major depressive disorder, single episode, unspecified: Secondary | ICD-10-CM | POA: Diagnosis not present

## 2016-09-26 DIAGNOSIS — Z8249 Family history of ischemic heart disease and other diseases of the circulatory system: Secondary | ICD-10-CM | POA: Diagnosis not present

## 2016-09-26 DIAGNOSIS — I824Z9 Acute embolism and thrombosis of unspecified deep veins of unspecified distal lower extremity: Secondary | ICD-10-CM

## 2016-09-26 DIAGNOSIS — Z915 Personal history of self-harm: Secondary | ICD-10-CM | POA: Insufficient documentation

## 2016-09-26 DIAGNOSIS — R779 Abnormality of plasma protein, unspecified: Secondary | ICD-10-CM | POA: Insufficient documentation

## 2016-09-26 DIAGNOSIS — E8809 Other disorders of plasma-protein metabolism, not elsewhere classified: Secondary | ICD-10-CM

## 2016-09-26 DIAGNOSIS — F17210 Nicotine dependence, cigarettes, uncomplicated: Secondary | ICD-10-CM | POA: Insufficient documentation

## 2016-09-26 LAB — HEPATIC FUNCTION PANEL
ALT: 22 U/L (ref 14–54)
AST: 19 U/L (ref 15–41)
Albumin: 5.1 g/dL — ABNORMAL HIGH (ref 3.5–5.0)
Alkaline Phosphatase: 64 U/L (ref 38–126)
Bilirubin, Direct: <0.1 mg/dL — ABNORMAL LOW (ref 0.1–0.5)
Total Bilirubin: 0.6 mg/dL (ref 0.3–1.2)
Total Protein: 9.3 g/dL — ABNORMAL HIGH (ref 6.5–8.1)

## 2016-09-26 NOTE — Progress Notes (Signed)
Patient here today as new evaluation regarding DVT in left calf.  Referred by ED.  Patient c/o pain 6/10. Currently on Eliquis 5 mg twice a day.

## 2016-09-26 NOTE — Progress Notes (Signed)
[facility] day:  09/26/2016  Chief Complaint: Tammy Garza is a 36 y.o. female with a DVT who is referred by Dr. Elmon Else in consultation for assessment and management.  HPI:  The patient began to experience left lower leg pain on 09/07/2016. She had recently been on a 2-1/2 hour flight from [place] to [place]. She describes walking to relieve calf cramping. She returned home on 09/11/2016. She denied any swelling but noted pain. She presented to the [facility] ER on 09/17/2016 with persistent leg pain.  Left lower extremity duplex on 09/17/2016 revealed an acute appearing thrombus in the calf veins of the left lower extremity (posterior tibial and peroneal veins).  Clot burden was significant.  Labs included a hematocrit of 34.6, hemoglobin 12.0, MCV 91.1, platelets 219,000, and WBC 10,300.  BMP was normal.  She was discharged on Eliquis.  She was prescribed hydrocodone for pain.  She presented to the [facility] ER on 0508/2018 with ongoing left lower extremity pain.  She received a prescription for oxycodone (#10) and Flexeril.  She has been on DepoProvera x 1 year.  She notes a family history of thrombosis.  Her father had a DVT in his 21s. Her paternal grandmother and a DVT in her 56s or 67s. A maternal aunt had a DVT. Two paternal uncles had clots. There is no family history of early pregnancy loss  Symptomatically, she notes pain behind her calf, improved.  She remains on Eliquis.  She initially took Eliquis 10 mg BID x 1 week then 5 mg BID.  She denies any bruising or bleeding.   Past Medical History:  Diagnosis Date  . Cervical high risk human papillomavirus (HPV) DNA test positive   . Chronic gastritis   . Depression   . H. pylori infection   . History of attempted suicide   . History of gastric polyp   . Migraines   . Rash   . Shift work sleep disorder     Past Surgical History:  Procedure Laterality Date  . CHOLECYSTECTOMY     . TUBAL LIGATION      Family History  Problem Relation Age of Onset  . HIV Sister   . Cancer Sister 56       cervical cancer  . Asthma Daughter   . Hypertension Maternal Grandmother   . Hypertension Mother   . Obesity Mother   . Cancer Father 9       lung cancer  . Deep vein thrombosis Father   . COPD Paternal Grandmother   . Deep vein thrombosis Paternal Grandmother     Social History:  reports that she quit smoking about 4 years ago. Her smoking use included Cigarettes. She has a 1.25 pack-year smoking history. She has never used smokeless tobacco. She reports that she drinks about 0.6 oz of alcohol per week . She reports that she does not use drugs.  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  She wears a mask at work.  She has 2 daughters (ages 37 and 62).  The patient is alone today.  Allergies: No Known Allergies  Current Medications: Current Outpatient Prescriptions  Medication Sig Dispense Refill  . apixaban (ELIQUIS) 5 MG TABS tablet 10mg  by mouth twice daily for 7 days, then 5mg  twice daily. 70 tablet 0  . cyclobenzaprine (FLEXERIL) 5 MG tablet Take 1 tablet (5 mg total) by mouth 3 (three) times daily as needed for  muscle spasms. 15 tablet 0  . Liraglutide -Weight Management (SAXENDA) 18 MG/3ML SOPN Inject 3 mg into the skin daily. 18 mL 3  . oxyCODONE-acetaminophen (ROXICET) 5-325 MG tablet Take 1 tablet by mouth every 8 (eight) hours as needed. 10 tablet 0  . rizatriptan (MAXALT) 10 MG tablet Take 10 mg by mouth as needed.    . Vitamin D, Ergocalciferol, (DRISDOL) 50000 units CAPS capsule TAKE 1 CAPSULE (50,000 UNITS TOTAL) BY MOUTH EVERY 7 (SEVEN) DAYS. 12 capsule 0  . Insulin Pen Needle (NOVOFINE PLUS) 32G X 4 MM MISC 1 each by Does not apply route daily. (Patient not taking: Reported on 09/26/2016) 100 each 1   No current facility-administered medications for this visit.     Review of Systems:  GENERAL:  Feels good.  No fevers or  sweats.  Weight loss secondary to Saxenda. PERFORMANCE STATUS (ECOG):  1 HEENT:  No visual changes, runny nose, sore throat, mouth sores or tenderness. Lungs: No shortness of breath or cough.  No hemoptysis. Cardiac:  No chest pain, palpitations, orthopnea, or PND. GI:  No nausea, vomiting, diarrhea, constipation, melena or hematochezia. GU:  No urgency, frequency, dysuria, or hematuria. Musculoskeletal:  Pain in left calf.  No back pain.  No joint pain.  No muscle tenderness. Extremities:  No pain or swelling. Skin:  No rashes or skin changes. Neuro:  Migraine headaches.  No numbness or weakness, balance or coordination issues. Endocrine:  No diabetes, thyroid issues, hot flashes or night sweats. Psych:  No mood changes, depression or anxiety. Pain:  No focal pain. Review of systems:  All other systems reviewed and found to be negative.  Physical Exam: Blood pressure 111/77, pulse 96, temperature 99.3 F (37.4 C), temperature source Tympanic, resp. rate 18, weight 166 lb 8 oz (75.5 kg). GENERAL:  Well developed, well nourished, woman sitting comfortably in the exam room in no acute distress. MENTAL STATUS:  Alert and oriented to person, place and time. HEAD:  Brown hair.  Normocephalic, atraumatic, face symmetric, no Cushingoid features. EYES:  Brown eyes.  Pupils equal round and reactive to light and accomodation.  No conjunctivitis or scleral icterus. ENT:  Oropharynx clear without lesion.  Tongue normal. Mucous membranes moist.  RESPIRATORY:  Clear to auscultation without rales, wheezes or rhonchi. CARDIOVASCULAR:  Regular rate and rhythm without murmur, rub or gallop. ABDOMEN:  Soft, non-tender, with active bowel sounds, and no hepatosplenomegaly.  No masses. SKIN:  No rashes, ulcers or lesions. EXTREMITIES: Tender left calf to palpation.  No edema, no skin discoloration or tenderness.  No palpable cords. LYMPH NODES: No palpable cervical, supraclavicular, axillary or inguinal  adenopathy  NEUROLOGICAL: Unremarkable. PSYCH:  Appropriate.   Clinical Support on 09/26/2016  Component Date Value Ref Range Status  . Total Protein 09/26/2016 9.3* 6.5 - 8.1 g/dL Final  . Albumin 09/26/2016 5.1* 3.5 - 5.0 g/dL Final  . AST 09/26/2016 19  15 - 41 U/L Final  . ALT 09/26/2016 22  14 - 54 U/L Final  . Alkaline Phosphatase 09/26/2016 64  38 - 126 U/L Final  . Total Bilirubin 09/26/2016 0.6  0.3 - 1.2 mg/dL Final  . Bilirubin, Direct 09/26/2016 <0.1* 0.1 - 0.5 mg/dL Final  . Indirect Bilirubin 09/26/2016 NOT CALCULATED  0.3 - 0.9 mg/dL Final  . Recommendations-F5LEID: 09/26/2016 Comment   Final   Comment: (NOTE) Result:  Negative (no mutation found) Factor V Leiden is a specific mutation (R506Q) in the factor V gene that is associated with an  increased risk of venous thrombosis. Factor V Leiden is more resistant to inactivation by activated protein C.  As a result, factor V persists in the circulation leading to a mild hyper- coagulable state.  The Leiden mutation accounts for 90% - 95% of APC resistance.  Factor V Leiden has been reported in patients with deep vein thrombosis, pulmonary embolus, central retinal vein occlusion, cerebral sinus thrombosis and hepatic vein thrombosis. Other risk factors to be considered in the workup for venous thrombosis include the G20210A mutation in the factor II (prothrombin) gene, protein S and C deficiency, and antithrombin deficiencies. Anticardiolipin antibody and lupus anticoagulant analysis may be appropriate for certain patients, as well as homocysteine levels. Contact your local [facility] for information on how to order additi                          onal testing if desired. **Genetic counselors are available for health care providers to**  discuss results at [phone] ([phone]). Methodology: DNA analysis of the Factor V gene was performed by allele-specific PCR. The diagnostic sensitivity and specificity is >99% for  both. Molecular-based testing is highly accurate, but as in any laboratory test, diagnostic errors may occur. All test results must be combined with clinical information for the most accurate interpretation. This test was developed and its performance characteristics determined by [facility]. It has not been cleared or approved by the Food and Drug Administration. References: Voelkerding K (1996).  Clin Lab Med [phone]. Allison Quarry, PhD, [facility] Ruben Reason, PhD, [facility] Annetta Maw, M.S., PhD, [facility] Alfredo Bach, PhD, [facility] Norva Riffle, PhD, [facility] Earlean Polka PhD, [facility] Performed At: [facility] [address] [place], [place] [id number] Nechama Guard MD HE:[id number]                          5   . Recommendations-PTGENE: 09/26/2016 Comment   Final   Comment: (NOTE) NEGATIVE No mutation identified. Comment: A point mutation (G20210A) in the factor II (prothrombin) gene is the second most common cause of inherited thrombophilia. The incidence of this mutation in the [place] Caucasian population is about 2% and in the [place] American population it is approximately 0.5%. This mutation is rare in the [place] and Native American population. Being heterozygous for a prothrombin mutation increases the risk for developing venous thrombosis about 2 to 3 times above the general population risk. Being homozygous for the prothrombin gene mutation increases the relative risk for venous thrombosis further, although it is not yet known how much further the risk is increased. In women heterozygous for the prothrombin gene mutation, the use of estrogen containing oral contraceptives increases the relative risk of venous thrombosis about 16 times and the risk of developing cerebral thrombosis is also significantly increased. In pregnancy the pr                          othrombin gene mutation increases risk for venous thrombosis and may increase risk for stillbirth,  placental abruption, pre-eclampsia and fetal growth restriction. If the patient possesses two or more congenital or acquired thrombophilic risk factors, the risk for thrombosis may rise to more than the sum of the risk ratios for the individual mutations. This assay detects only the prothrombin G20210A mutation and does not measure genetic abnormalities elsewhere in the genome. Other thrombotic risk factors may be pursued through systematic clinical laboratory analysis.  These factors include the R506Q (Leiden) mutation in the Factor V gene, plasma homocysteine levels, as well as testing for deficiencies of antithrombin III, protein C and protein S. Genetic Counselors are available for health care providers to discuss results at [phone] [phone]). Methodology: DNA analysis of the Factor II gene was performed by PCR amplification followed by restriction analysis. The di                          agnostic sensitivity is >99% for both. All the tests must be combined with clinical information for the most accurate interpretation. Molecular-based testing is highly accurate, but as in any laboratory test, diagnostic errors may occur. This test was developed and its performance characteristics determined by [facility]. It has not been cleared or approved by the Food and Drug Administration. Poort SR, et al. Blood. 1996; 70:2637-8588. Varga EA. Circulation. 2004; 502:D74-J28. Mervin Hack, et [place]; 19:700-703. Allison Quarry, PhD, [facility] Ruben Reason, PhD, [facility] Annetta Maw, M.S., PhD, [facility] Alfredo Bach, PhD, [facility] Norva Riffle, PhD, [facility] Earlean Polka, PhD, [facility] Performed At: [facility] [address] [place], [place] [id number] Nechama Guard MD OB:[id number]   . PTT Lupus Anticoagulant 09/26/2016 28.8  0.0 - 51.9 sec Final  . DRVVT 09/26/2016 37.4  0.0 - 47.0 sec Final  . Lupus Anticoag Interp 09/26/2016 Comment:    Corrected   Comment: (NOTE) No lupus anticoagulant was detected. Performed At: [facility] [place], [place] [id number] Lindon Romp MD TW:[id number]   . Anticardiolipin IgG 09/26/2016 <9  0 - 14 GPL U/mL Final   Comment: (NOTE)                          Negative:              <15                          Indeterminate:     15 - 20                          Low-Med Positive: >20 - 80                          High Positive:         >80   . Anticardiolipin IgM 09/26/2016 <9  0 - 12 MPL U/mL Final   Comment: (NOTE)                          Negative:              <13                          Indeterminate:     13 - 20                          Low-Med Positive: >20 - 80                          High Positive:         >80   . Anticardiolipin IgA 09/26/2016 <9  0 - 11 APL U/mL  Final   Comment: (NOTE)                          Negative:              <12                          Indeterminate:     12 - 20                          Low-Med Positive: >20 - 80                          High Positive:         >80 Performed At: [facility] [place], [place] [id number] Lindon Romp MD OA:[id number]   . Beta-2 Glyco I IgG 09/26/2016 <9  0 - 20 GPI IgG units Final   Comment: (NOTE) The reference interval reflects a 3SD or 99th percentile interval, which is thought to represent a potentially clinically significant result in accordance with the International Consensus Statement on the classification criteria for definitive antiphospholipid syndrome (APS). J Thromb Haem 2006;4:295-306.   . Beta-2-Glycoprotein I IgM 09/26/2016 <9  0 - 32 GPI IgM units Final   Comment: (NOTE) The reference interval reflects a 3SD or 99th percentile interval, which is thought to represent a potentially clinically significant result in accordance with the International Consensus Statement on the classification criteria for definitive antiphospholipid syndrome  (APS). J Thromb Haem 2006;4:295-306. Performed At: [facility] [place], [place] [id number] Lindon Romp MD DD:[id number]   . Beta-2-Glycoprotein I IgA 09/26/2016 <9  0 - 25 GPI IgA units Final   Comment: (NOTE) The reference interval reflects a 3SD or 99th percentile interval, which is thought to represent a potentially clinically significant result in accordance with the International Consensus Statement on the classification criteria for definitive antiphospholipid syndrome (APS). J Thromb Haem 2006;4:295-306.   Marland Kitchen Kappa free light chain 09/26/2016 18.4  3.3 - 19.4 mg/L Final  . Lamda free light chains 09/26/2016 17.9  5.7 - 26.3 mg/L Final  . Kappa, lamda light chain ratio 09/26/2016 1.03  0.26 - 1.65 Final   Comment: (NOTE) Performed At: [facility] [place], [place] [id number] Lindon Romp MD VV:[id number]     Assessment:  Tammy Garza is a 36 y.o. female with a left lower extremity DVT following a 2 1/2 hour flight.  She has been on DepoProvera x 1 year.  She has a family history of thrombosis.  Left lower extremity duplex on 09/17/2016 revealed an acute appearing thrombus in the calf veins of the left lower extremity (posterior tibial and peroneal veins).  She is on Eliquis.    Symptomatically, she has left calf discomfort.  Exam is unremarkable.  Plan: 1.  Discuss diagnosis and management of DVTs.  Discuss analogy of boat/basket floating in the water.  Risk factors for thrombosis are rocks.  Enough rocks in the basket and the basket sinks.  Sinking representing clot.  Risk factors described included malignancy, pregnancy, birth control pills (including DepoProvera), long car or plane rides (patient on 2 1/2 hour flight), obesity, dehydration, pelvic or lower extremity surgery, and blood disorders (Factor V Leiden, prothrombin gene mutation, lupus anticoagulant, protein C deficiency, protein S deficiency, ATIII deficiency).   Concern raised  about potential hypercoagulable state based on family history.  Length of anticoagulation at least 6 months. 2.  Discuss eliminating risk factors she can control (DepoProvera). 3.  Labs today:  LFTs, Factor V Leiden, prothrombin gene mutation, lupus anticoagulant panel, anticardiolipin antibodies, beta2-glycoprotein antibodies. 4.  Continue Eliquis. 5.  RTC in 3 months for MD assessment and labs (CBC, CMP, protein S antigen/activity, protein C antigen/activity, AT III antigen/activity).  Addendum:  Labs revealed an elevated serum protein.  Will check SPEP and free light chains.   Lequita Asal, MD  09/26/2016, 1:45 PM

## 2016-09-27 ENCOUNTER — Other Ambulatory Visit: Payer: Self-pay | Admitting: *Deleted

## 2016-09-27 ENCOUNTER — Telehealth: Payer: Self-pay | Admitting: Family Medicine

## 2016-09-27 LAB — LUPUS ANTICOAGULANT PANEL
DRVVT: 37.4 s (ref 0.0–47.0)
PTT Lupus Anticoagulant: 28.8 s (ref 0.0–51.9)

## 2016-09-27 NOTE — Telephone Encounter (Signed)
Her pain is more intense than expected, she can stay out if she need to, but I will likely not be able to extend beyond Friday. I will fill forms when she comes in

## 2016-09-27 NOTE — Telephone Encounter (Signed)
Pt notified and understands

## 2016-09-27 NOTE — Telephone Encounter (Signed)
Pt has an appt on 09/29/2016 for her paperwork and she is asking for her leave to be extended until she comes in on Friday. Please advise. Paperwork upfront.

## 2016-09-28 ENCOUNTER — Telehealth: Payer: Self-pay | Admitting: *Deleted

## 2016-09-28 ENCOUNTER — Encounter: Payer: Self-pay | Admitting: *Deleted

## 2016-09-28 ENCOUNTER — Encounter: Payer: Self-pay | Admitting: Family Medicine

## 2016-09-28 LAB — KAPPA/LAMBDA LIGHT CHAINS
Kappa free light chain: 18.4 mg/L (ref 3.3–19.4)
Kappa, lambda light chain ratio: 1.03 (ref 0.26–1.65)
Lambda free light chains: 17.9 mg/L (ref 5.7–26.3)

## 2016-09-28 LAB — BETA-2-GLYCOPROTEIN I ABS, IGG/M/A
Beta-2 Glyco I IgG: <9 GPI IgG units (ref 0–20)
Beta-2-Glycoprotein I IgA: <9 GPI IgA units (ref 0–25)
Beta-2-Glycoprotein I IgM: <9 GPI IgM units (ref 0–32)

## 2016-09-28 LAB — CARDIOLIPIN ANTIBODIES, IGG, IGM, IGA
Anticardiolipin IgA: <9 APL U/mL (ref 0–11)
Anticardiolipin IgG: <9 GPL U/mL (ref 0–14)
Anticardiolipin IgM: <9 MPL U/mL (ref 0–12)

## 2016-09-28 NOTE — Telephone Encounter (Addendum)
States the nurse was to write a note for her to return to work and it has not been received. Please fax to [phone]

## 2016-09-28 NOTE — Telephone Encounter (Signed)
I will send today

## 2016-09-29 ENCOUNTER — Ambulatory Visit (INDEPENDENT_AMBULATORY_CARE_PROVIDER_SITE_OTHER): Payer: Commercial Managed Care - HMO | Admitting: Family Medicine

## 2016-09-29 ENCOUNTER — Encounter: Payer: Self-pay | Admitting: Family Medicine

## 2016-09-29 VITALS — BP 104/62 | HR 80 | Temp 98.1°F | Resp 16 | Wt 170.2 lb

## 2016-09-29 DIAGNOSIS — I824Z2 Acute embolism and thrombosis of unspecified deep veins of left distal lower extremity: Secondary | ICD-10-CM | POA: Diagnosis not present

## 2016-09-29 LAB — PROTHROMBIN GENE MUTATION

## 2016-09-29 LAB — FACTOR 5 LEIDEN

## 2016-09-29 NOTE — Progress Notes (Signed)
Name: Tammy Garza   MRN: [id number]    DOB: Jul 16, 1980   Date:09/29/2016       Progress Note  Subjective  Chief Complaint  Chief Complaint  Patient presents with  . DVT    paperwork    HPI  Left Calf DVT: She was discharged from the ED on May 6th,  and started on Eliquis. She was seen in our office on May 7th 2018 and was still in pain, she was given Hydrocodone but pain got worse went to [facility] again on 09/19/2016 and was given Percocet. She is still having mild pain, mostly tightness , but tolerable, off pain medication, compliant with Eliquis. There is a significant history of DVT in her family ( father, two uncles, and paternal grandmother) , she has seen Dr. Mike Gip, high protein levels, will follow up in 3 months. I will let Dr. Mike Gip decide if she can continue on Depo and how long to stay on Eliquis.   Results from ED LLE Venous Duplex as Follows: IMPRESSION: Acute appearing thrombus is demonstrated in the calf veins of the left lower extremity, with involvement of posterior tibial and peroneal veins. Electronically Signed By: Lucienne Capers M.D. On: 09/17/2016 21:40    Patient Active Problem List   Diagnosis Date Noted  . Elevated blood protein 09/26/2016  . Acute deep vein thrombosis (DVT) of distal lower extremity ([place]) 09/17/2016  . Vitamin D deficiency 07/26/2015  . Thyroid cyst 07/26/2015  . Pap smear of cervix shows high risk HPV present 04/22/2015  . History of Helicobacter pylori infection 04/22/2015  . Migraine with aura and without status migrainosus 04/22/2015  . Shift work sleep disorder 04/22/2015    Past Surgical History:  Procedure Laterality Date  . CHOLECYSTECTOMY    . TUBAL LIGATION      Family History  Problem Relation Age of Onset  . HIV Sister   . Cancer Sister 38       cervical cancer  . Asthma Daughter   . Hypertension Maternal Grandmother   . Hypertension Mother   . Obesity Mother   . Cancer Father 23       lung cancer  . Deep  vein thrombosis Father   . COPD Paternal Grandmother   . Deep vein thrombosis Paternal Grandmother     Social History   Social History  . Marital status: Married    Spouse name: N/A  . Number of children: N/A  . Years of education: N/A   Occupational History  . Not on file.   Social History Main Topics  . Smoking status: Former Smoker    Packs/day: 0.25    Years: 5.00    Types: Cigarettes    Quit date: 05/15/2012  . Smokeless tobacco: Never Used  . Alcohol use 0.6 oz/week    1 Glasses of wine per week  . Drug use: No  . Sexual activity: Yes    Partners: Male    Birth control/ protection: Other-see comments, Injection, Surgical     Comment: Tubal Ligation   Other Topics Concern  . Not on file   Social History Narrative   Separated since June 2016 - she has two children at home . The youngest is his daughter. Working at [employer] in [place]. She works at [employer]. She had been laid off by back since 2017.    Dating Gwyndolyn Saxon since 2015     Current Outpatient Prescriptions:  .  apixaban (ELIQUIS) 5 MG TABS tablet, 60m by mouth twice  daily for 7 days, then 68m twice daily., Disp: 70 tablet, Rfl: 0 .  cyclobenzaprine (FLEXERIL) 5 MG tablet, Take 1 tablet (5 mg total) by mouth 3 (three) times daily as needed for muscle spasms., Disp: 15 tablet, Rfl: 0 .  Insulin Pen Needle (NOVOFINE PLUS) 32G X 4 MM MISC, 1 each by Does not apply route daily. (Patient not taking: Reported on 09/26/2016), Disp: 100 each, Rfl: 1 .  Liraglutide -Weight Management (SAXENDA) 18 MG/3ML SOPN, Inject 3 mg into the skin daily., Disp: 18 mL, Rfl: 3 .  oxyCODONE-acetaminophen (ROXICET) 5-325 MG tablet, Take 1 tablet by mouth every 8 (eight) hours as needed., Disp: 10 tablet, Rfl: 0 .  rizatriptan (MAXALT) 10 MG tablet, Take 10 mg by mouth as needed., Disp: , Rfl:  .  Vitamin D, Ergocalciferol, (DRISDOL) 50000 units CAPS capsule, TAKE 1 CAPSULE (50,000 UNITS TOTAL) BY MOUTH EVERY 7 (SEVEN) DAYS.,  Disp: 12 capsule, Rfl: 0  No Known Allergies   ROS  Ten systems reviewed and is negative except as mentioned in HPI   Objective  Vitals:   09/29/16 0804  BP: 104/62  Pulse: 80  Resp: 16  Temp: 98.1 F (36.7 C)  SpO2: 97%  Weight: 170 lb 3 oz (77.2 kg)    Body mass index is 27.47 kg/m.  Physical Exam  Constitutional: Patient appears well-developed and well-nourished. Obese No distress.  HEENT: head atraumatic, normocephalic, pupils equal and reactive to light,  neck supple, throat within normal limits Cardiovascular: Normal rate, regular rhythm and normal heart sounds.  No murmur heard. No BLE edema. Mild tenderness with compression of calf, but not dorsiflexion, mild antalgic gait - states feels tight when walking Pulmonary/Chest: Effort normal and breath sounds normal. No respiratory distress. Abdominal: Soft.  There is no tenderness. Psychiatric: Patient has a normal mood and affect. behavior is normal. Judgment and thought content normal.  Recent Results (from the past 2160 hour(s))  GC/Chlamydia Probe Amp     Status: None   Collection Time: 07/06/16 12:08 PM  Result Value Ref Range   CT Probe RNA NOT DETECTED     Comment:                    **Normal Reference Range: NOT DETECTED**   This test was performed using the APTIMA COMBO2 Assay ([place]).   The analytical performance characteristics of this assay, when used to test SurePath specimens have been determined by [facility]      GC Probe RNA NOT DETECTED     Comment:                    **Normal Reference Range: NOT DETECTED**   This test was performed using the APTIMA COMBO2 Assay ([place]   The analytical performance characteristics of this assay, when used to test SurePath specimens [facility]    POCT Wet Prep ([facility]     Status: Abnormal   Collection Time: 07/06/16 12:22 PM  Result Value Ref Range   Source Wet Prep POC vaginal    WBC,  Wet Prep HPF POC few    Bacteria Wet Prep HPF POC  Few   BACTERIA WET PREP MORPHOLOGY POC     Clue Cells Wet Prep HPF POC Few (A) None   Clue Cells Wet Prep Whiff POC     Yeast Wet Prep HPF POC Many    KOH Wet Prep POC  Trichomonas Wet Prep HPF POC Absent Absent  Urine Culture     Status: None   Collection Time: 07/06/16 12:32 PM  Result Value Ref Range   Organism ID, Bacteria      Multiple organisms present,each less than 10,000 CFU/mL. These organisms,commonly found on external and internal genitalia,are considered colonizers. No further testing performed.   Pap IG and HPV (high risk) DNA detection     Status: None   Collection Time: 08/22/16  9:46 AM  Result Value Ref Range   HPV DNA High Risk Not Detected     Comment: HIGH RISK HPV types (16,18,31,33,35,39,45,51,52,56,58,59,66,68) were not detected. Other HPV types which cause anogenital lesions may be present. The significance of the other types of HPV in malignant  processes has not been established.                  ** Normal Reference Range: Not Detected **      HPV High Risk testing performed using the APTIMA HPV mRNA Assay.      Specimen adequacy:      Comment: SATISFACTORY.  Endocervical/transformation zone component present.   FINAL DIAGNOSIS:      Comment: - NEGATIVE FOR INTRAEPITHELIAL LESIONS OR MALIGNANCY.    COMMENTS:      Comment: LMP (Last Menstrual Period) of the patient is not given. This Pap test has been evaluated with computer assisted technology.    Cytotechnologist:      Comment: JLT, BS CT(ASCP) *  The Pap is a screening test for cervical cancer. It is not a  diagnostic test and is subject to false negative and false positive  results. It is most reliable when a satisfactory sample, regularly  obtained, is submitted with relevant clinical findings and history,  and when the Pap result is evaluated along with historic and current  clinical information.   CBC     Status: Abnormal    Collection Time: 09/17/16  9:24 PM  Result Value Ref Range   WBC 10.3 3.6 - 11.0 K/uL   RBC 3.80 3.80 - 5.20 MIL/uL   Hemoglobin 12.0 12.0 - 16.0 g/dL   HCT 34.6 (L) 35.0 - 47.0 %   MCV 91.1 80.0 - 100.0 fL   MCH 31.5 26.0 - 34.0 pg   MCHC 34.6 32.0 - 36.0 g/dL   RDW 13.4 11.5 - 14.5 %   Platelets 219 150 - 440 K/uL  Basic metabolic panel     Status: None   Collection Time: 09/17/16  9:24 PM  Result Value Ref Range   Sodium 139 135 - 145 mmol/L   Potassium 3.9 3.5 - 5.1 mmol/L   Chloride 104 101 - 111 mmol/L   CO2 27 22 - 32 mmol/L   Glucose, Bld 92 65 - 99 mg/dL   BUN 9 6 - 20 mg/dL   Creatinine, Ser 0.83 0.44 - 1.00 mg/dL   Calcium 8.9 8.9 - 10.3 mg/dL   GFR calc non Af Amer >60 >60 mL/min   GFR calc Af Amer >60 >60 mL/min    Comment: (NOTE) The eGFR has been calculated using the CKD EPI equation. This calculation has not been validated in all clinical situations. eGFR's persistently <60 mL/min signify possible Chronic Kidney Disease.    Anion gap 8 5 - 15  Protime-INR     Status: None   Collection Time: 09/17/16  9:24 PM  Result Value Ref Range   Prothrombin Time 12.7 11.4 - 15.2 seconds   INR 0.95  APTT     Status: None   Collection Time: 09/17/16  9:24 PM  Result Value Ref Range   aPTT 26 24 - 36 seconds  Pregnancy, urine POC     Status: None   Collection Time: 09/17/16  9:58 PM  Result Value Ref Range   Preg Test, Ur NEGATIVE NEGATIVE    Comment:        THE SENSITIVITY OF THIS METHODOLOGY IS >24 mIU/mL   Hepatic function panel     Status: Abnormal   Collection Time: 09/26/16  4:45 PM  Result Value Ref Range   Total Protein 9.3 (H) 6.5 - 8.1 g/dL   Albumin 5.1 (H) 3.5 - 5.0 g/dL   AST 19 15 - 41 U/L   ALT 22 14 - 54 U/L   Alkaline Phosphatase 64 38 - 126 U/L   Total Bilirubin 0.6 0.3 - 1.2 mg/dL   Bilirubin, Direct <0.1 (L) 0.1 - 0.5 mg/dL   Indirect Bilirubin NOT CALCULATED 0.3 - 0.9 mg/dL  Lupus anticoagulant panel     Status: None   Collection  Time: 09/26/16  4:45 PM  Result Value Ref Range   PTT Lupus Anticoagulant 28.8 0.0 - 51.9 sec   DRVVT 37.4 0.0 - 47.0 sec   Lupus Anticoag Interp Comment:     Comment: (NOTE) No lupus anticoagulant was detected. Performed At: [facility] [place], [place] [id number] Lindon Romp MD YH:[id number]   Cardiolipin antibodies, IgG, IgM, IgA     Status: None   Collection Time: 09/26/16  4:45 PM  Result Value Ref Range   Anticardiolipin IgG <9 0 - 14 GPL U/mL    Comment: (NOTE)                          Negative:              <15                          Indeterminate:     15 - 20                          Low-Med Positive: >20 - 80                          High Positive:         >80    Anticardiolipin IgM <9 0 - 12 MPL U/mL    Comment: (NOTE)                          Negative:              <13                          Indeterminate:     13 - 20                          Low-Med Positive: >20 - 80                          High Positive:         >80    Anticardiolipin IgA <9 0 - 11 APL U/mL    Comment: (NOTE)  Negative:              <12                          Indeterminate:     12 - 20                          Low-Med Positive: >20 - 80                          High Positive:         >80 Performed At: [facility] [place], [place] [id number] Lindon Romp MD AS:[id number]   Beta-2-glycoprotein i abs, IgG/M/A     Status: None   Collection Time: 09/26/16  4:45 PM  Result Value Ref Range   Beta-2 Glyco I IgG <9 0 - 20 GPI IgG units    Comment: (NOTE) The reference interval reflects a 3SD or 99th percentile interval, which is thought to represent a potentially clinically significant result in accordance with the International Consensus Statement on the classification criteria for definitive antiphospholipid syndrome (APS). J Thromb Haem 2006;4:295-306.    Beta-2-Glycoprotein I IgM <9 0 - 32 GPI IgM units     Comment: (NOTE) The reference interval reflects a 3SD or 99th percentile interval, which is thought to represent a potentially clinically significant result in accordance with the International Consensus Statement on the classification criteria for definitive antiphospholipid syndrome (APS). J Thromb Haem 2006;4:295-306. Performed At: [facility] [place], [place] [id number] Lindon Romp MD [id number]    Beta-2-Glycoprotein I IgA <9 0 - 25 GPI IgA units    Comment: (NOTE) The reference interval reflects a 3SD or 99th percentile interval, which is thought to represent a potentially clinically significant result in accordance with the International Consensus Statement on the classification criteria for definitive antiphospholipid syndrome (APS). J Thromb Haem 2006;4:295-306.   Kappa/lambda light chains     Status: None   Collection Time: 09/26/16  4:45 PM  Result Value Ref Range   Kappa free light chain 18.4 3.3 - 19.4 mg/L   Lamda free light chains 17.9 5.7 - 26.3 mg/L   Kappa, lamda light chain ratio 1.03 0.26 - 1.65    Comment: (NOTE) Performed At: [facility] [place], [place] [id number] Lindon Romp MD [id number]      PHQ2/9: Depression screen [facility] 2/9 08/22/2016 06/14/2016 02/22/2016 01/11/2016 07/26/2015  Decreased Interest 0 0 0 0 0  Down, Depressed, Hopeless 0 0 0 0 0  PHQ - 2 Score 0 0 0 0 0     Fall Risk: Fall Risk  08/22/2016 06/14/2016 02/22/2016 01/11/2016 07/26/2015  Falls in the past year? No No No No No    Assessment & Plan  1. Acute deep vein thrombosis (DVT) of distal end of left lower extremity ([place])  Seen by Dr. Mike Gip, only positive test so far is elevated protein and albumin, she is on Eliquis 5 mg twice daily and pain is still present but not as intense. She has been off Percocet for the past few days. Advised that she can take Tylenol if needed. Pain is down to 1/10. She will return to  work on Monday.   May return to work Monday without restrictions  Discussed symptoms of PE, advised to go to call 911 in case of SOB, diaphoresis  or chest pain

## 2016-09-30 ENCOUNTER — Encounter: Payer: Self-pay | Admitting: Hematology and Oncology

## 2016-10-03 ENCOUNTER — Telehealth: Payer: Self-pay

## 2016-10-03 DIAGNOSIS — I824Z9 Acute embolism and thrombosis of unspecified deep veins of unspecified distal lower extremity: Secondary | ICD-10-CM

## 2016-10-03 DIAGNOSIS — Z3009 Encounter for other general counseling and advice on contraception: Secondary | ICD-10-CM

## 2016-10-03 LAB — MULTIPLE MYELOMA PANEL, SERUM

## 2016-10-03 NOTE — Telephone Encounter (Signed)
We can refer her to her GYN of choice, not sure if her diagnosis would cover hysterectomy . Dr. Mike Gip is researching to see if IUD if an option.

## 2016-10-03 NOTE — Telephone Encounter (Signed)
Patient called to inquire if Dr. Nolon Stalls has responded about the patient starting back on Depo.

## 2016-10-03 NOTE — Telephone Encounter (Signed)
Patient was informed Dr. Mike Gip does not recommend Depo, but the patient states it helped with her migraines. So patient wanted to inquire is a partial hysterectomy possible?

## 2016-10-04 ENCOUNTER — Telehealth: Payer: Self-pay | Admitting: *Deleted

## 2016-10-04 ENCOUNTER — Other Ambulatory Visit: Payer: Self-pay | Admitting: *Deleted

## 2016-10-04 ENCOUNTER — Ambulatory Visit: Payer: Commercial Managed Care - HMO | Admitting: Family Medicine

## 2016-10-04 ENCOUNTER — Encounter: Payer: Self-pay | Admitting: Hematology and Oncology

## 2016-10-04 NOTE — Telephone Encounter (Signed)
Patient states she would like to go to [facility] for her referral to GYN.

## 2016-10-04 NOTE — Telephone Encounter (Signed)
Left message for patient that the labs were wnl and that we needed to draw another level, will give to schedulers to arrange

## 2016-10-05 ENCOUNTER — Other Ambulatory Visit: Payer: Self-pay | Admitting: Family Medicine

## 2016-10-05 ENCOUNTER — Other Ambulatory Visit: Payer: Self-pay | Admitting: *Deleted

## 2016-10-05 DIAGNOSIS — Z30018 Encounter for initial prescription of other contraceptives: Secondary | ICD-10-CM

## 2016-10-05 DIAGNOSIS — I824Z9 Acute embolism and thrombosis of unspecified deep veins of unspecified distal lower extremity: Secondary | ICD-10-CM

## 2016-10-05 DIAGNOSIS — I824Z2 Acute embolism and thrombosis of unspecified deep veins of left distal lower extremity: Secondary | ICD-10-CM

## 2016-10-06 ENCOUNTER — Inpatient Hospital Stay: Payer: 59

## 2016-10-06 DIAGNOSIS — I824Z9 Acute embolism and thrombosis of unspecified deep veins of unspecified distal lower extremity: Secondary | ICD-10-CM

## 2016-10-06 DIAGNOSIS — I82402 Acute embolism and thrombosis of unspecified deep veins of left lower extremity: Secondary | ICD-10-CM | POA: Diagnosis not present

## 2016-10-11 LAB — MULTIPLE MYELOMA PANEL, SERUM
Albumin SerPl Elph-Mcnc: 3.2 g/dL (ref 2.9–4.4)
Albumin/Glob SerPl: 1.2 (ref 0.7–1.7)
Alpha 1: 0.3 g/dL (ref 0.0–0.4)
Alpha2 Glob SerPl Elph-Mcnc: 0.8 g/dL (ref 0.4–1.0)
B-Globulin SerPl Elph-Mcnc: 1 g/dL (ref 0.7–1.3)
Gamma Glob SerPl Elph-Mcnc: 0.6 g/dL (ref 0.4–1.8)
Globulin, Total: 2.7 g/dL (ref 2.2–3.9)
IgA: 182 mg/dL (ref 87–352)
IgG (Immunoglobin G), Serum: 698 mg/dL — ABNORMAL LOW (ref 700–1600)
IgM, Serum: 39 mg/dL (ref 26–217)
Total Protein ELP: 5.9 g/dL — ABNORMAL LOW (ref 6.0–8.5)

## 2016-10-13 ENCOUNTER — Telehealth: Payer: Self-pay | Admitting: Hematology and Oncology

## 2016-10-13 ENCOUNTER — Other Ambulatory Visit: Payer: Self-pay | Admitting: *Deleted

## 2016-10-13 ENCOUNTER — Telehealth: Payer: Self-pay | Admitting: *Deleted

## 2016-10-13 ENCOUNTER — Other Ambulatory Visit: Payer: Self-pay | Admitting: Hematology and Oncology

## 2016-10-13 DIAGNOSIS — E8809 Other disorders of plasma-protein metabolism, not elsewhere classified: Secondary | ICD-10-CM

## 2016-10-13 DIAGNOSIS — R779 Abnormality of plasma protein, unspecified: Secondary | ICD-10-CM

## 2016-10-13 DIAGNOSIS — I824Z9 Acute embolism and thrombosis of unspecified deep veins of unspecified distal lower extremity: Secondary | ICD-10-CM

## 2016-10-13 DIAGNOSIS — D472 Monoclonal gammopathy: Secondary | ICD-10-CM

## 2016-10-13 MED ORDER — APIXABAN 5 MG PO TABS: ORAL_TABLET | ORAL | 0 refills | Status: DC

## 2016-10-13 MED ORDER — APIXABAN 5 MG PO TABS: 5.0000 mg | ORAL_TABLET | Freq: Two times a day (BID) | ORAL | 1 refills | Status: DC

## 2016-10-13 NOTE — Telephone Encounter (Signed)
Re:  SPEP  Discussed results of SPEP from 10/06/2016.  No quantitative monoclonal spike. Immunofixation revealed an IgA monoclonal protein with lambda light chain specificity.  Free light chains were negative. Recommended 24 hour UPEP with free light chains.  Lequita Asal, MD

## 2016-10-13 NOTE — Telephone Encounter (Signed)
  Please call patient.  Is she approved to Xarelto?  M

## 2016-10-13 NOTE — Telephone Encounter (Signed)
Called patient and handled this

## 2016-10-13 NOTE — Telephone Encounter (Signed)
States it is time to refill her Eliquis, but asking to change over to Xarelto because she would only have to take it once a day as opposed to BID dosing with the Eliquis. Please advise.

## 2016-10-18 ENCOUNTER — Ambulatory Visit: Payer: Commercial Managed Care - HMO

## 2016-10-18 ENCOUNTER — Encounter: Payer: Self-pay | Admitting: Hematology and Oncology

## 2016-10-24 ENCOUNTER — Other Ambulatory Visit: Payer: Self-pay

## 2016-10-24 DIAGNOSIS — R779 Abnormality of plasma protein, unspecified: Secondary | ICD-10-CM

## 2016-10-24 DIAGNOSIS — E8809 Other disorders of plasma-protein metabolism, not elsewhere classified: Secondary | ICD-10-CM

## 2016-10-25 LAB — IFE+PROTEIN ELECTRO, 24-HR UR
% BETA, Urine: 24.6 %
ALPHA 1 URINE: 4.2 %
Albumin, U: 28.8 %
Alpha 2, Urine: 15.8 %
GAMMA GLOBULIN URINE: 26.6 %
Total Protein, Urine-Ur/day: 87 mg/24 hr (ref 30–150)
Total Protein, Urine: 19.3 mg/dL
Total Volume: 450

## 2016-10-27 ENCOUNTER — Telehealth: Payer: Self-pay | Admitting: *Deleted

## 2016-10-27 NOTE — Telephone Encounter (Signed)
Returned call about lab results, all labs are wnl per Dr. Mike Gip, voiced understanding.

## 2016-11-02 ENCOUNTER — Ambulatory Visit (INDEPENDENT_AMBULATORY_CARE_PROVIDER_SITE_OTHER): Payer: 59 | Admitting: Obstetrics & Gynecology

## 2016-11-02 ENCOUNTER — Encounter: Payer: Self-pay | Admitting: Obstetrics & Gynecology

## 2016-11-02 VITALS — BP 110/80 | HR 84 | Ht 66.0 in | Wt 164.0 lb

## 2016-11-02 DIAGNOSIS — G43829 Menstrual migraine, not intractable, without status migrainosus: Secondary | ICD-10-CM | POA: Diagnosis not present

## 2016-11-02 DIAGNOSIS — Z86718 Personal history of other venous thrombosis and embolism: Secondary | ICD-10-CM | POA: Insufficient documentation

## 2016-11-02 DIAGNOSIS — N920 Excessive and frequent menstruation with regular cycle: Secondary | ICD-10-CM | POA: Diagnosis not present

## 2016-11-02 NOTE — Progress Notes (Signed)
HPI:      [name] is a 36 y.o. [id number] who LMP was No LMP recorded. Patient has had an injection., presents today for a problem visit.  She complains of menorrhagia that  began several months ago and its severity is described as moderate.  She has regular periods every 28 days and they are associated with moderate menstrual cramping.  She has used the following for attempts at control: maxi pad.  Previous evaluation: none. Prior Diagnosis: dysfunctional uterine bleeding. Previous Treatment: NSAID (IBF) and Depo that helped, but now that she developed DVT does not want hormones any more.  She is on blood thinner medicine for at least 6 mos now for that.  Her biggest concern is Menstrual Migraines with each period.  PMHx: She  has a past medical history of Cervical high risk human papillomavirus (HPV) DNA test positive; Chronic gastritis; Depression; H. pylori infection; History of attempted suicide; History of gastric polyp; Migraines; Rash; and Shift work sleep disorder. Also,  has a past surgical history that includes Tubal ligation and Cholecystectomy., family history includes Asthma in her daughter; COPD in her paternal grandmother; Cancer (age of onset: 18) in her sister; Cancer (age of onset: 103) in her father; Deep vein thrombosis in her father and paternal grandmother; HIV in her sister; Hypertension in her maternal grandmother and mother; Obesity in her mother.,  reports that she quit smoking about 4 years ago. Her smoking use included Cigarettes. She has a 1.25 pack-year smoking history. She has never used smokeless tobacco. She reports that she drinks about 0.6 oz of alcohol per week . She reports that she does not use drugs.  She has a current medication list which includes the following prescription(s): apixaban, cyclobenzaprine, insulin pen needle, liraglutide -weight management, oxycodone-acetaminophen, rizatriptan, and vitamin d (ergocalciferol). Also, has No Known  Allergies.  Review of Systems  Constitutional: Negative for chills, fever and malaise/fatigue.  HENT: Negative for congestion, sinus pain and sore throat.   Eyes: Negative for blurred vision and pain.  Respiratory: Negative for cough and wheezing.   Cardiovascular: Negative for chest pain and leg swelling.  Gastrointestinal: Negative for abdominal pain, constipation, diarrhea, heartburn, nausea and vomiting.  Genitourinary: Negative for dysuria, frequency, hematuria and urgency.  Musculoskeletal: Negative for back pain, joint pain, myalgias and neck pain.  Skin: Negative for itching and rash.  Neurological: Negative for dizziness, tremors and weakness.  Endo/Heme/Allergies: Does not bruise/bleed easily.  Psychiatric/Behavioral: Negative for depression. The patient is not nervous/anxious and does not have insomnia.    Objective: BP 110/80   Pulse 84   Ht 5\' 6"  (1.676 m)   Wt 164 lb (74.4 kg)   BMI 26.47 kg/m  Physical Exam  Constitutional: She is oriented to person, place, and time. She appears well-developed and well-nourished. No distress.  Musculoskeletal: Normal range of motion.  Neurological: She is alert and oriented to person, place, and time.  Skin: Skin is warm and dry.  Psychiatric: She has a normal mood and affect.  Vitals reviewed.   ASSESSMENT/PLAN:  Menorrhagia, Menstrual Migraines, Dysmenorrhea  Problem List Items Addressed This Visit      Cardiovascular and Mediastinum   Menstrual migraine without status migrainosus, not intractable   Relevant Orders   US Transvaginal Non-OB     Other   Menorrhagia with regular cycle - Primary   Relevant Orders   US Transvaginal Non-OB   History of DVT (deep vein thrombosis)    Hematology consult to manage meds perioperatively  Ablation and hysterectomy discussed as surgical options to treat pain, bleeding and menstrual migraines.  Prefers hysterectomy.  Recovery discussed, info gv.  [place] to assess uterus for fibroids  first.   Barnett Applebaum, MD, [facility], [facility] 11/02/2016  3:21 PM

## 2016-11-02 NOTE — Patient Instructions (Signed)
Total Laparoscopic Hysterectomy °A total laparoscopic hysterectomy is a minimally invasive surgery to remove your uterus and cervix. This surgery is performed by making several small cuts (incisions) in your abdomen. It can also be done with a thin, lighted tube (laparoscope) inserted into two small incisions in your lower abdomen. Your fallopian tubes and ovaries can be removed (bilateral salpingo-oophorectomy) during this surgery as well. Benefits of minimally invasive surgery include: °· Less pain. °· Less risk of blood loss. °· Less risk of infection. °· Quicker return to normal activities. ° °Tell a health care provider about: °· Any allergies you have. °· All medicines you are taking, including vitamins, herbs, eye drops, creams, and over-the-counter medicines. °· Any problems you or family members have had with anesthetic medicines. °· Any blood disorders you have. °· Any surgeries you have had. °· Any medical conditions you have. °What are the risks? °Generally, this is a safe procedure. However, as with any procedure, complications can occur. Possible complications include: °· Bleeding. °· Blood clots in the legs or lung. °· Infection. °· Injury to surrounding organs. °· Problems with anesthesia. °· Early menopause symptoms (hot flashes, night sweats, insomnia). °· Risk of conversion to an open abdominal incision. ° °What happens before the procedure? °· Ask your health care provider about changing or stopping your regular medicines. °· Do not take aspirin or blood thinners (anticoagulants) for 1 week before the surgery or as told by your health care provider. °· Do not eat or drink anything for 8 hours before the surgery or as told by your health care provider. °· Quit smoking if you smoke. °· Arrange for a ride home after surgery and for someone to help you at home during recovery. °What happens during the procedure? °· You will be given antibiotic medicine. °· An IV tube will be placed in your arm. You  will be given medicine to make you sleep (general anesthetic). °· A gas (carbon dioxide) will be used to inflate your abdomen. This will allow your surgeon to look inside your abdomen, perform your surgery, and treat any other problems found if necessary. °· Three or four small incisions (often less than 1/2 inch) will be made in your abdomen. One of these incisions will be made in the area of your belly button (navel). The laparoscope will be inserted into the incision. Your surgeon will look through the laparoscope while doing your procedure. °· Other surgical instruments will be inserted through the other incisions. °· Your uterus may be removed through your vagina or cut into small pieces and removed through the small incisions. °· Your incisions will be closed. °What happens after the procedure? °· The gas will be released from inside your abdomen. °· You will be taken to the recovery area where a nurse will watch and check your progress. Once you are awake, stable, and taking fluids well, without other problems, you will return to your room or be allowed to go home. °· There is usually minimal discomfort following the surgery because the incisions are so small. °· You will be given pain medicine while you are in the hospital and for when you go home. °This information is not intended to replace advice given to you by your health care provider. Make sure you discuss any questions you have with your health care provider. °Document Released: 02/26/2007 Document Revised: 10/07/2015 Document Reviewed: 11/19/2012 °Elsevier Interactive Patient Education © 2017 [employer] ° °

## 2016-11-20 ENCOUNTER — Ambulatory Visit (INDEPENDENT_AMBULATORY_CARE_PROVIDER_SITE_OTHER): Payer: 59 | Admitting: Family Medicine

## 2016-11-20 ENCOUNTER — Encounter: Payer: Self-pay | Admitting: Family Medicine

## 2016-11-20 ENCOUNTER — Other Ambulatory Visit: Payer: Self-pay | Admitting: Family Medicine

## 2016-11-20 ENCOUNTER — Other Ambulatory Visit: Payer: Self-pay

## 2016-11-20 ENCOUNTER — Ambulatory Visit
Admission: RE | Admit: 2016-11-20 | Discharge: 2016-11-20 | Disposition: A | Discharge status: Home / Self Care | Payer: 59 | Source: Ambulatory Visit | Attending: Family Medicine | Admitting: Family Medicine

## 2016-11-20 VITALS — BP 116/68 | HR 88 | Temp 98.3°F | Resp 18 | Ht 66.0 in | Wt 165.8 lb

## 2016-11-20 DIAGNOSIS — Z86718 Personal history of other venous thrombosis and embolism: Secondary | ICD-10-CM | POA: Insufficient documentation

## 2016-11-20 DIAGNOSIS — M79605 Pain in left leg: Secondary | ICD-10-CM | POA: Insufficient documentation

## 2016-11-20 DIAGNOSIS — M79604 Pain in right leg: Secondary | ICD-10-CM

## 2016-11-20 DIAGNOSIS — N920 Excessive and frequent menstruation with regular cycle: Secondary | ICD-10-CM

## 2016-11-20 NOTE — Addendum Note (Signed)
Addended by: Inda Coke on: 11/20/2016 08:41 AM   Modules accepted: Orders

## 2016-11-20 NOTE — Progress Notes (Signed)
Name: Tammy Garza   MRN: [id number]    DOB: [birth year]   Date:11/20/2016       Progress Note  Subjective  Chief Complaint  Chief Complaint  Patient presents with  . Leg Pain    Has had bilateral leg pains since Thursday has taken the pain medication and muscle relaxer with no relief, states it feels like a cramping feeling constantly    HPI  Leg pain: she walked more than usual on July 4 th and one day later noticed aching pain on both lower legs ( calves ) no swelling, pain is constant but worse when walking, described as tightness, also aggravated when squeezing it with her hands. She states she had tennis shoes the day that she walked a lot. She has been elevating legs and applying ice without much help. She is on Eliquis for DVT left lower leg April 2018. She is seeing [place] [facility] for menorrhagia ( can't take ocp and has heavy and painful cycles) and is considering hysterectomy.   Patient Active Problem List   Diagnosis Date Noted  . Menorrhagia with regular cycle 11/02/2016  . Menstrual migraine without status migrainosus, not intractable 11/02/2016  . History of DVT (deep vein thrombosis) 11/02/2016  . Elevated blood protein 09/26/2016  . Acute deep vein thrombosis (DVT) of distal lower extremity ([place]) 09/17/2016  . Vitamin D deficiency 07/26/2015  . Thyroid cyst 07/26/2015  . Pap smear of cervix shows high risk HPV present 04/22/2015  . History of Helicobacter pylori infection 04/22/2015  . Migraine with aura and without status migrainosus 04/22/2015  . Shift work sleep disorder 04/22/2015    Past Surgical History:  Procedure Laterality Date  . CHOLECYSTECTOMY    . TUBAL LIGATION      Family History  Problem Relation Age of Onset  . HIV Sister   . Cancer Sister 10       cervical cancer  . Asthma Daughter   . Hypertension Maternal Grandmother   . Hypertension Mother   . Obesity Mother   . Cancer Father 39       lung cancer  . Deep vein thrombosis Father   . COPD  Paternal Grandmother   . Deep vein thrombosis Paternal Grandmother     Social History   Social History  . Marital status: Married    Spouse name: N/A  . Number of children: N/A  . Years of education: N/A   Occupational History  . Not on file.   Social History Main Topics  . Smoking status: Former Smoker    Packs/day: 0.25    Years: 5.00    Types: Cigarettes    Quit date: 05/15/2012  . Smokeless tobacco: Never Used  . Alcohol use 0.6 oz/week    1 Glasses of wine per week  . Drug use: No  . Sexual activity: Yes    Partners: Male    Birth control/ protection: Surgical     Comment: Tubal Ligation   Other Topics Concern  . Not on file   Social History Narrative   Separated since June 2016 - she has two children at home . The youngest is his daughter. Working at [employer] in [place]. She works at [employer]. She had been laid off by back since 2017.    Dating Gwyndolyn Saxon since 2015     Current Outpatient Prescriptions:  .  apixaban (ELIQUIS) 5 MG TABS tablet, Take 1 tablet (5 mg total) by mouth 2 (two) times daily., Disp: 60 tablet,  Rfl: 1 .  cyclobenzaprine (FLEXERIL) 5 MG tablet, Take 1 tablet (5 mg total) by mouth 3 (three) times daily as needed for muscle spasms., Disp: 15 tablet, Rfl: 0 .  oxyCODONE-acetaminophen (ROXICET) 5-325 MG tablet, Take 1 tablet by mouth every 8 (eight) hours as needed., Disp: 10 tablet, Rfl: 0 .  rizatriptan (MAXALT) 10 MG tablet, Take 10 mg by mouth as needed., Disp: , Rfl:  .  Vitamin D, Ergocalciferol, (DRISDOL) 50000 units CAPS capsule, TAKE 1 CAPSULE (50,000 UNITS TOTAL) BY MOUTH EVERY 7 (SEVEN) DAYS., Disp: 12 capsule, Rfl: 0  No Known Allergies   ROS  Ten systems reviewed and is negative except as mentioned in HPI   Objective  Vitals:   11/20/16 0806  BP: 116/68  Pulse: 88  Resp: 18  Temp: 98.3 F (36.8 C)  TempSrc: Oral  SpO2: 99%  Weight: 165 lb 12.8 oz (75.2 kg)  Height: 5' 6"  (1.676 m)    Body mass index is 26.76  kg/m.  Physical Exam  Constitutional: Patient appears well-developed and well-nourished. Overweight  No distress.  HEENT: head atraumatic, normocephalic, pupils equal and reactive to light, neck supple, throat within normal limits Cardiovascular: Normal rate, regular rhythm and normal heart sounds.  No murmur heard. No BLE edema. Pulmonary/Chest: Effort normal and breath sounds normal. No respiratory distress. Abdominal: Soft.  There is no tenderness. Muscular Skeletal: tender during palpation of both lower legs from ankle all the way up, no swelling or redness, tender also when dorsiflexing foot. Pain worse on left ( previous DVT still on Eliquis)  Psychiatric: Patient has a normal mood and affect. behavior is normal. Judgment and thought content normal.  Recent Results (from the past 2160 hour(s))  Pap IG and HPV (high risk) DNA detection     Status: None   Collection Time: 08/22/16  9:46 AM  Result Value Ref Range   HPV DNA High Risk Not Detected     Comment: HIGH RISK HPV types (16,18,31,33,35,39,45,51,52,56,58,59,66,68) were not detected. Other HPV types which cause anogenital lesions may be present. The significance of the other types of HPV in malignant  processes has not been established.                  ** Normal Reference Range: Not Detected **      HPV High Risk testing performed using the APTIMA HPV mRNA Assay.      Specimen adequacy:      Comment: SATISFACTORY.  Endocervical/transformation zone component present.   FINAL DIAGNOSIS:      Comment: - NEGATIVE FOR INTRAEPITHELIAL LESIONS OR MALIGNANCY.    COMMENTS:      Comment: LMP (Last Menstrual Period) of the patient is not given. This Pap test has been evaluated with computer assisted technology.    Cytotechnologist:      Comment: JLT, BS CT(ASCP) *  The Pap is a screening test for cervical cancer. It is not a  diagnostic test and is subject to false negative and false positive  results. It is most reliable  when a satisfactory sample, regularly  obtained, is submitted with relevant clinical findings and history,  and when the Pap result is evaluated along with historic and current  clinical information.   CBC     Status: Abnormal   Collection Time: 09/17/16  9:24 PM  Result Value Ref Range   WBC 10.3 3.6 - 11.0 K/uL   RBC 3.80 3.80 - 5.20 MIL/uL   Hemoglobin 12.0 12.0 - 16.0 g/dL  HCT 34.6 (L) 35.0 - 47.0 %   MCV 91.1 80.0 - 100.0 fL   MCH 31.5 26.0 - 34.0 pg   MCHC 34.6 32.0 - 36.0 g/dL   RDW 13.4 11.5 - 14.5 %   Platelets 219 150 - 440 K/uL  Basic metabolic panel     Status: None   Collection Time: 09/17/16  9:24 PM  Result Value Ref Range   Sodium 139 135 - 145 mmol/L   Potassium 3.9 3.5 - 5.1 mmol/L   Chloride 104 101 - 111 mmol/L   CO2 27 22 - 32 mmol/L   Glucose, Bld 92 65 - 99 mg/dL   BUN 9 6 - 20 mg/dL   Creatinine, Ser 0.83 0.44 - 1.00 mg/dL   Calcium 8.9 8.9 - 10.3 mg/dL   GFR calc non Af Amer >60 >60 mL/min   GFR calc Af Amer >60 >60 mL/min    Comment: (NOTE) The eGFR has been calculated using the CKD EPI equation. This calculation has not been validated in all clinical situations. eGFR's persistently <60 mL/min signify possible Chronic Kidney Disease.    Anion gap 8 5 - 15  Protime-INR     Status: None   Collection Time: 09/17/16  9:24 PM  Result Value Ref Range   Prothrombin Time 12.7 11.4 - 15.2 seconds   INR 0.95   APTT     Status: None   Collection Time: 09/17/16  9:24 PM  Result Value Ref Range   aPTT 26 24 - 36 seconds  Pregnancy, urine POC     Status: None   Collection Time: 09/17/16  9:58 PM  Result Value Ref Range   Preg Test, Ur NEGATIVE NEGATIVE    Comment:        THE SENSITIVITY OF THIS METHODOLOGY IS >24 mIU/mL   Hepatic function panel     Status: Abnormal   Collection Time: 09/26/16  4:45 PM  Result Value Ref Range   Total Protein 9.3 (H) 6.5 - 8.1 g/dL   Albumin 5.1 (H) 3.5 - 5.0 g/dL   AST 19 15 - 41 U/L   ALT 22 14 - 54 U/L    Alkaline Phosphatase 64 38 - 126 U/L   Total Bilirubin 0.6 0.3 - 1.2 mg/dL   Bilirubin, Direct <0.1 (L) 0.1 - 0.5 mg/dL   Indirect Bilirubin NOT CALCULATED 0.3 - 0.9 mg/dL  Factor 5 leiden     Status: None   Collection Time: 09/26/16  4:45 PM  Result Value Ref Range   Recommendations-F5LEID: Comment     Comment: (NOTE) Result:  Negative (no mutation found) Factor V Leiden is a specific mutation (R506Q) in the factor V gene that is associated with an increased risk of venous thrombosis. Factor V Leiden is more resistant to inactivation by activated protein C.  As a result, factor V persists in the circulation leading to a mild hyper- coagulable state.  The Leiden mutation accounts for 90% - 95% of APC resistance.  Factor V Leiden has been reported in patients with deep vein thrombosis, pulmonary embolus, central retinal vein occlusion, cerebral sinus thrombosis and hepatic vein thrombosis. Other risk factors to be considered in the workup for venous thrombosis include the G20210A mutation in the factor II (prothrombin) gene, protein S and C deficiency, and antithrombin deficiencies. Anticardiolipin antibody and lupus anticoagulant analysis may be appropriate for certain patients, as well as homocysteine levels. Contact your local [facility] for information on how to order additi onal testing if desired. **  Genetic counselors are available for health care providers to**  discuss results at [phone] [phone]). Methodology: DNA analysis of the Factor V gene was performed by allele-specific PCR. The diagnostic sensitivity and specificity is >99% for both. Molecular-based testing is highly accurate, but as in any laboratory test, diagnostic errors may occur. All test results must be combined with clinical information for the most accurate interpretation. This test was developed and its performance characteristics determined by [facility]. It has not been cleared or approved by the Food  and Drug Administration. References: Voelkerding K (1996).  Clin Lab Med [id number]. Allison Quarry, PhD, [facility] Ruben Reason, PhD, [facility] Annetta Maw, M.S., PhD, [facility] Alfredo Bach, PhD, [facility] Norva Riffle, PhD, [facility] Earlean Polka PhD, [facility] Performed At: [facility] [place], [place] [id number] Nechama Guard MD QM:[id number]   Prothrombin gene mutation     Status: None   Collection Time: 09/26/16  4:45 PM  Result Value Ref Range   Recommendations-PTGENE: Comment     Comment: (NOTE) NEGATIVE No mutation identified. Comment: A point mutation (G20210A) in the factor II (prothrombin) gene is the second most common cause of inherited thrombophilia. The incidence of this mutation in the [place] Caucasian population is about 2% and in the [place] American population it is approximately 0.5%. This mutation is rare in the [place] and Native American population. Being heterozygous for a prothrombin mutation increases the risk for developing venous thrombosis about 2 to 3 times above the general population risk. Being homozygous for the prothrombin gene mutation increases the relative risk for venous thrombosis further, although it is not yet known how much further the risk is increased. In women heterozygous for the prothrombin gene mutation, the use of estrogen containing oral contraceptives increases the relative risk of venous thrombosis about 16 times and the risk of developing cerebral thrombosis is also significantly increased. In pregnancy the pr othrombin gene mutation increases risk for venous thrombosis and may increase risk for stillbirth, placental abruption, pre-eclampsia and fetal growth restriction. If the patient possesses two or more congenital or acquired thrombophilic risk factors, the risk for thrombosis may rise to more than the sum of the risk ratios for the individual mutations. This assay detects only the prothrombin  G20210A mutation and does not measure genetic abnormalities elsewhere in the genome. Other thrombotic risk factors may be pursued through systematic clinical laboratory analysis. These factors include the R506Q (Leiden) mutation in the Factor V gene, plasma homocysteine levels, as well as testing for deficiencies of antithrombin III, protein C and protein S. Genetic Counselors are available for health care providers to discuss results at [phone]-GENE [phone]). Methodology: DNA analysis of the Factor II gene was performed by PCR amplification followed by restriction analysis. The di agnostic sensitivity is >99% for both. All the tests must be combined with clinical information for the most accurate interpretation. Molecular-based testing is highly accurate, but as in any laboratory test, diagnostic errors may occur. This test was developed and its performance characteristics determined by [facility]. It has not been cleared or approved by the Food and Drug Administration. Poort SR, et al. Blood. 1996; 71:2458-0998. Varga EA. Circulation. 2004; 338:S50-N39. Mervin Hack, et [place]; 19:700-703. Allison Quarry, PhD, [facility] Ruben Reason, PhD, [facility] Annetta Maw, M.S., PhD, [facility] Alfredo Bach, PhD, [facility] Norva Riffle, PhD, [facility] Earlean Polka, PhD, [facility] Performed At: [facility] [address] [place], [place] [id number] Nechama Guard MD [id number]  Lupus anticoagulant panel     Status: None   Collection Time: 09/26/16  4:45 PM  Result Value Ref Range   PTT Lupus Anticoagulant 28.8 0.0 - 51.9 sec   DRVVT 37.4 0.0 - 47.0 sec   Lupus Anticoag Interp Comment:     Comment: (NOTE) No lupus anticoagulant was detected. Performed At: [facility] [place], [place] [id number] Lindon Romp MD WU:[id number]   Cardiolipin antibodies, IgG, IgM, IgA     Status: None   Collection Time: 09/26/16  4:45 PM   Result Value Ref Range   Anticardiolipin IgG <9 0 - 14 GPL U/mL    Comment: (NOTE)                          Negative:              <15                          Indeterminate:     15 - 20                          Low-Med Positive: >20 - 80                          High Positive:         >80    Anticardiolipin IgM <9 0 - 12 MPL U/mL    Comment: (NOTE)                          Negative:              <13                          Indeterminate:     13 - 20                          Low-Med Positive: >20 - 80                          High Positive:         >80    Anticardiolipin IgA <9 0 - 11 APL U/mL    Comment: (NOTE)                          Negative:              <12                          Indeterminate:     12 - 20                          Low-Med Positive: >20 - 80                          High Positive:         >80 Performed At: [facility] [address] [place], [place] [id number] Lindon Romp MD VH:[id number]   Beta-2-glycoprotein i abs, IgG/M/A     Status: None   Collection Time: 09/26/16  4:45 PM  Result Value Ref Range   Beta-2 Glyco I IgG <9 0 - 20 GPI IgG units    Comment: (NOTE) The reference interval reflects a 3SD or 99th percentile interval, which is thought to represent a potentially clinically significant result in accordance with the International Consensus Statement on the classification criteria for definitive antiphospholipid syndrome (APS). J Thromb Haem 2006;4:295-306.    Beta-2-Glycoprotein I IgM <9 0 - 32 GPI IgM units    Comment: (NOTE) The reference interval reflects a 3SD or 99th percentile interval, which is thought to represent a potentially clinically significant result in accordance with the International Consensus Statement on the classification criteria for definitive antiphospholipid syndrome (APS). J Thromb Haem 2006;4:295-306. Performed At: [facility] [place], [place] [id number] Lindon Romp  MD [id number]    Beta-2-Glycoprotein I IgA <9 0 - 25 GPI IgA units    Comment: (NOTE) The reference interval reflects a 3SD or 99th percentile interval, which is thought to represent a potentially clinically significant result in accordance with the International Consensus Statement on the classification criteria for definitive antiphospholipid syndrome (APS). J Thromb Haem 2006;4:295-306.   Kappa/lambda light chains     Status: None   Collection Time: 09/26/16  4:45 PM  Result Value Ref Range   Kappa free light chain 18.4 3.3 - 19.4 mg/L   Lamda free light chains 17.9 5.7 - 26.3 mg/L   Kappa, lamda light chain ratio 1.03 0.26 - 1.65    Comment: (NOTE) Performed At: [facility] [place], [place] [id number] Lindon Romp MD YB:[id number]   Multiple Myeloma Panel (SPEP&IFE w/QIG)     Status: None   Collection Time: 09/26/16  4:50 PM  Result Value Ref Range   IgG (Immunoglobin G), Serum SEE SEPARATE REPORT mg/dL    Comment: Performed at [facility] [address], [place], [place] [address]  Multiple Myeloma Panel (SPEP&IFE w/QIG)     Status: Abnormal   Collection Time: 10/06/16 11:33 AM  Result Value Ref Range   IgG (Immunoglobin G), Serum 698 (L) 700 - 1,600 mg/dL   IgA 182 87 - 352 mg/dL   IgM, Serum 39 26 - 217 mg/dL   Total Protein ELP 5.9 (L) 6.0 - 8.5 g/dL   Albumin SerPl Elph-Mcnc 3.2 2.9 - 4.4 g/dL   Alpha 1 0.3 0.0 - 0.4 g/dL   Alpha2 Glob SerPl Elph-Mcnc 0.8 0.4 - 1.0 g/dL   B-Globulin SerPl Elph-Mcnc 1.0 0.7 - 1.3 g/dL   Gamma Glob SerPl Elph-Mcnc 0.6 0.4 - 1.8 g/dL   M Protein SerPl Elph-Mcnc Not Observed Not Observed g/dL   Globulin, Total 2.7 2.2 - 3.9 g/dL   Albumin/Glob SerPl 1.2 0.7 - 1.7   IFE 1 Comment     Comment: (NOTE) Immunofixation shows IgA monoclonal protein with lambda light chain specificity.    Please Note Comment     Comment: (NOTE) Protein electrophoresis scan will follow via computer, mail, or courier  delivery. Performed At: [facility] [place], [place] [id number] Lindon Romp MD RX:[id number]   Frederik Schmidt, 24-HR UR     Status: None   Collection Time: 10/22/16  8:00 AM  Result Value Ref Range   Total Protein, Urine 19.3 Not Estab. mg/dL   Total Protein, Urine-Ur/day 87 30 - 150 mg/24 hr   Albumin, U 28.8 %   ALPHA 1 URINE 4.2 %   Alpha 2, Urine 15.8 %   % BETA, Urine  24.6 %   GAMMA GLOBULIN URINE 26.6 %   M-SPIKE %, Urine Not Observed Not Observed %   Immunofixation Result, Urine Comment     Comment: An apparent normal immunofixation pattern.   Note: Comment     Comment: (NOTE) Protein electrophoresis scan will follow via computer, mail, or courier delivery. Performed At: [facility] [place], [place] [id number] Lindon Romp MD [id number]    Total Volume 450       PHQ2/9: Depression screen [facility] 2/9 11/20/2016 08/22/2016 06/14/2016 02/22/2016 01/11/2016  Decreased Interest 0 0 0 0 0  Down, Depressed, Hopeless 0 0 0 0 0  PHQ - 2 Score 0 0 0 0 0     Fall Risk: Fall Risk  11/20/2016 08/22/2016 06/14/2016 02/22/2016 01/11/2016  Falls in the past year? No No No No No    Functional Status Survey: Is the patient deaf or have difficulty hearing?: No Does the patient have difficulty seeing, even when wearing glasses/contacts?: No Does the patient have difficulty concentrating, remembering, or making decisions?: No Does the patient have difficulty walking or climbing stairs?: No Does the patient have difficulty dressing or bathing?: No Does the patient have difficulty doing errands alone such as visiting a doctor's office or shopping?: No    Assessment & Plan  1. History of DVT (deep vein thrombosis)  - DOPPLER VENOUS LEGS BILATERAL; Future  2. Pain in both lower extremities  She has a history of DVT, bilateral lower calves pain, started after walking more than usual.  It may be muscular, but if negative  doppler advised to use Biofreeze and rub the muscle since she cannot take NSAID's. She can also take Tylenol  - DOPPLER VENOUS LEGS BILATERAL; Future

## 2016-11-22 ENCOUNTER — Encounter: Payer: Self-pay | Admitting: Obstetrics & Gynecology

## 2016-11-22 ENCOUNTER — Ambulatory Visit (INDEPENDENT_AMBULATORY_CARE_PROVIDER_SITE_OTHER): Payer: 59 | Admitting: Obstetrics & Gynecology

## 2016-11-22 ENCOUNTER — Ambulatory Visit (INDEPENDENT_AMBULATORY_CARE_PROVIDER_SITE_OTHER): Payer: 59

## 2016-11-22 VITALS — BP 120/80 | HR 86 | Ht 66.0 in | Wt 166.0 lb

## 2016-11-22 DIAGNOSIS — D219 Benign neoplasm of connective and other soft tissue, unspecified: Secondary | ICD-10-CM | POA: Insufficient documentation

## 2016-11-22 DIAGNOSIS — R102 Pelvic and perineal pain: Secondary | ICD-10-CM

## 2016-11-22 DIAGNOSIS — N920 Excessive and frequent menstruation with regular cycle: Secondary | ICD-10-CM

## 2016-11-22 DIAGNOSIS — D252 Subserosal leiomyoma of uterus: Secondary | ICD-10-CM

## 2016-11-22 DIAGNOSIS — G43829 Menstrual migraine, not intractable, without status migrainosus: Secondary | ICD-10-CM | POA: Diagnosis not present

## 2016-11-22 NOTE — Progress Notes (Signed)
  HPI: Uterine Fibroids Patient presents with uterine fibroids. Periods are heavy and also has pain and headaches.  Has h/o DVT, cannot take hormones. Ultrasound demonstrates one 3 cm SS Fibroid.  Normal ovaries. These findings are new.  PMHx: She  has a past medical history of Cervical high risk human papillomavirus (HPV) DNA test positive; Chronic gastritis; Depression; H. pylori infection; History of attempted suicide; History of gastric polyp; Migraines; Rash; and Shift work sleep disorder. Also,  has a past surgical history that includes Tubal ligation and Cholecystectomy., family history includes Asthma in her daughter; COPD in her paternal grandmother; Cancer (age of onset: 28) in her sister; Cancer (age of onset: 43) in her father; Deep vein thrombosis in her father and paternal grandmother; HIV in her sister; Hypertension in her maternal grandmother and mother; Obesity in her mother.,  reports that she quit smoking about 4 years ago. Her smoking use included Cigarettes. She has a 1.25 pack-year smoking history. She has never used smokeless tobacco. She reports that she drinks about 0.6 oz of alcohol per week . She reports that she does not use drugs.  She has a current medication list which includes the following prescription(s): apixaban, cyclobenzaprine, rizatriptan, and vitamin d (ergocalciferol). Also, has No Known Allergies.  Review of Systems  All other systems reviewed and are negative.  Objective: BP 120/80   Pulse 86   Ht 5\' 6"  (1.676 m)   Wt 166 lb (75.3 kg)   BMI 26.79 kg/m   Physical examination Constitutional NAD, Conversant  Skin No rashes, lesions or ulceration.   Extremities: Moves all appropriately.  Normal ROM for age. No lymphadenopathy.  Neuro: Grossly intact  Psych: Oriented to PPT.  Normal mood. Normal affect.   Assessment:  Menorrhagia with regular cycle  Subserous leiomyoma of uterus  Pt desires hysterectomy over all other forms of therapy for  menorrhagia pain and migraines assoc w menses.  Pros and cons disucssed.  Plan Aug 7.  Hematology to advise as to Lovenox therapy periop.  Barnett Applebaum, MD, [facility], [facility] 11/22/2016  4:43 PM

## 2016-11-24 ENCOUNTER — Telehealth: Payer: Self-pay | Admitting: Obstetrics & Gynecology

## 2016-11-24 NOTE — Telephone Encounter (Signed)
-----   Message from Gae Dry, MD sent at 11/22/2016  4:46 PM EDT ----- Regarding: Surgery Surgery Booking Request Patient Full Name:   MRN: [id number]  DOB: 01-23-1981  Surgeon: Hoyt Koch, MD  Requested Surgery Date and Time: 12/19/16 Primary Diagnosis AND Code: Menorrhagia (N92.0), Pelvic Pain (R10.2), Fibroid (D25.2) Secondary Diagnosis and Code: mENSTRUAL mIGRAINE (G43.839)  Surgical Procedure: TLH/BS L&D Notification: No Admission Status: same day surgery Length of Surgery: 1 Special Case Needs: no H&P: yes (date) Phone Interview???: yes Interpreter: Language:  Medical Clearance: Hematology sch for 7/19 Special Scheduling Instructions: no

## 2016-11-24 NOTE — Telephone Encounter (Signed)
Patient is aware of H&P on 12/07/16 @ 3:30pm with Pre-admit Testing phone interview afterwards, and OR on 12/19/16. Patient was given my ext.

## 2016-11-30 ENCOUNTER — Inpatient Hospital Stay: Payer: 59 | Attending: Hematology and Oncology | Admitting: Hematology and Oncology

## 2016-11-30 ENCOUNTER — Encounter: Payer: Self-pay | Admitting: Hematology and Oncology

## 2016-11-30 ENCOUNTER — Inpatient Hospital Stay: Payer: 59

## 2016-11-30 VITALS — BP 104/67 | HR 76 | Temp 97.0°F | Resp 18 | Wt 170.6 lb

## 2016-11-30 DIAGNOSIS — R634 Abnormal weight loss: Secondary | ICD-10-CM | POA: Diagnosis not present

## 2016-11-30 DIAGNOSIS — F329 Major depressive disorder, single episode, unspecified: Secondary | ICD-10-CM | POA: Diagnosis not present

## 2016-11-30 DIAGNOSIS — D472 Monoclonal gammopathy: Secondary | ICD-10-CM

## 2016-11-30 DIAGNOSIS — Z915 Personal history of self-harm: Secondary | ICD-10-CM | POA: Insufficient documentation

## 2016-11-30 DIAGNOSIS — I82402 Acute embolism and thrombosis of unspecified deep veins of left lower extremity: Secondary | ICD-10-CM | POA: Diagnosis not present

## 2016-11-30 DIAGNOSIS — Z87891 Personal history of nicotine dependence: Secondary | ICD-10-CM | POA: Diagnosis not present

## 2016-11-30 DIAGNOSIS — R61 Generalized hyperhidrosis: Secondary | ICD-10-CM

## 2016-11-30 DIAGNOSIS — Z8249 Family history of ischemic heart disease and other diseases of the circulatory system: Secondary | ICD-10-CM | POA: Diagnosis not present

## 2016-11-30 DIAGNOSIS — I824Z9 Acute embolism and thrombosis of unspecified deep veins of unspecified distal lower extremity: Secondary | ICD-10-CM | POA: Insufficient documentation

## 2016-11-30 DIAGNOSIS — Z7901 Long term (current) use of anticoagulants: Secondary | ICD-10-CM | POA: Diagnosis not present

## 2016-11-30 DIAGNOSIS — R5383 Other fatigue: Secondary | ICD-10-CM | POA: Diagnosis not present

## 2016-11-30 DIAGNOSIS — Z79899 Other long term (current) drug therapy: Secondary | ICD-10-CM | POA: Diagnosis not present

## 2016-11-30 LAB — COMPREHENSIVE METABOLIC PANEL
ALT: 15 U/L (ref 14–54)
AST: 15 U/L (ref 15–41)
Albumin: 4.2 g/dL (ref 3.5–5.0)
Alkaline Phosphatase: 46 U/L (ref 38–126)
Anion gap: 6 (ref 5–15)
BUN: 10 mg/dL (ref 6–20)
CO2: 25 mmol/L (ref 22–32)
Calcium: 9.1 mg/dL (ref 8.9–10.3)
Chloride: 106 mmol/L (ref 101–111)
Creatinine, Ser: 0.73 mg/dL (ref 0.44–1.00)
GFR calc Af Amer: >60 mL/min (ref >60)
GFR calc non Af Amer: >60 mL/min (ref >60)
Glucose, Bld: 88 mg/dL (ref 65–99)
Potassium: 4 mmol/L (ref 3.5–5.1)
Sodium: 137 mmol/L (ref 135–145)
Total Bilirubin: 0.8 mg/dL (ref 0.3–1.2)
Total Protein: 7.7 g/dL (ref 6.5–8.1)

## 2016-11-30 LAB — CBC WITH DIFFERENTIAL/PLATELET
Basophils Absolute: 0.1 10*3/uL (ref 0–0.1)
Basophils Relative: 1 %
Eosinophils Absolute: 0.2 10*3/uL (ref 0–0.7)
Eosinophils Relative: 3 %
HCT: 37.5 % (ref 35.0–47.0)
Hemoglobin: 12.8 g/dL (ref 12.0–16.0)
Lymphocytes Relative: 40 %
Lymphs Abs: 2.8 10*3/uL (ref 1.0–3.6)
MCH: 31.5 pg (ref 26.0–34.0)
MCHC: 34.1 g/dL (ref 32.0–36.0)
MCV: 92.4 fL (ref 80.0–100.0)
Monocytes Absolute: 0.6 10*3/uL (ref 0.2–0.9)
Monocytes Relative: 9 %
Neutro Abs: 3.2 10*3/uL (ref 1.4–6.5)
Neutrophils Relative %: 47 %
Platelets: 262 10*3/uL (ref 150–440)
RBC: 4.05 MIL/uL (ref 3.80–5.20)
RDW: 13.8 % (ref 11.5–14.5)
WBC: 6.9 10*3/uL (ref 3.6–11.0)

## 2016-11-30 LAB — LACTATE DEHYDROGENASE: LDH: 109 U/L (ref 98–192)

## 2016-11-30 LAB — URIC ACID: Uric Acid, Serum: 4.4 mg/dL (ref 2.3–6.6)

## 2016-11-30 NOTE — Progress Notes (Signed)
[facility] day:  11/30/2016   Chief Complaint: Tammy Garza is a 36 y.o. female with a DVT who is referred by Dr. Elmon Else who is seen for review of work-up and discussion regarding direction of therapy.  HPI:  The patient was last seen in the hematology clinic on 09/26/2016.  At that time, she was seen for initial consultation.  She developed a left lower extremity clot after a long plane ride and Depo Provera.  She had several relatives with thrombosis.  She was on Eliquis.  She underwent a partial hypercoagulable work-up.  The following studies were negative:  Factor V Leiden, prothrombin gene mutation, lupus anticoagulant panel, anticardiolipin antibodies, beta2-glycoprotein,   Labs at last visit included a serum protein of 9.3 (6.5-8.1) with an albumen of 5.1 (3.5-5.0).  SPEP was negative.  Immunofixation revealed an IgA monoclonal protein with lambda light chain specificity.  Free light chain assay was normal.  IgG was 698 [phone]).  IgA was 182 (87-352).  24 hour UPEP on 10/22/2016 revealed no monoclonal protein.  During the interim, she had some cramping in her leg. Bilateral lower extremity duplex on 11/20/2016 revealed no evidence of acute DVT within either lower extremity. There was some residual nonocclusive thrombus in the left perineal vein, sequela of previous DVT on the prior scan.  She notes diarrhea after eating for 3-1/2 weeks. She's had no diarrhea in the past week. She describes heavy sweating. She describes a diagnosis of hyperhidrosis. Sweats occur both during the day and night. She recently lost 12 pounds from 174 to 162 poundd. She has gained some weight back. She notes being fatigued.   She is scheduled for partial hysterectomy on 12/19/2016.   Past Medical History:  Diagnosis Date  . Cervical high risk human papillomavirus (HPV) DNA test positive   . Chronic gastritis   . Depression   . H. pylori infection    . History of attempted suicide   . History of gastric polyp   . Migraines   . Rash   . Shift work sleep disorder     Past Surgical History:  Procedure Laterality Date  . CHOLECYSTECTOMY    . TUBAL LIGATION      Family History  Problem Relation Age of Onset  . HIV Sister   . Cancer Sister 24       cervical cancer  . Asthma Daughter   . Hypertension Maternal Grandmother   . Hypertension Mother   . Obesity Mother   . Cancer Father 14       lung cancer  . Deep vein thrombosis Father   . COPD Paternal Grandmother   . Deep vein thrombosis Paternal Grandmother     Social History:  reports that she quit smoking about 4 years ago. Her smoking use included Cigarettes. She has a 1.25 pack-year smoking history. She has never used smokeless tobacco. She reports that she drinks about 0.6 oz of alcohol per week . She reports that she does not use drugs.  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  She wears a mask at work.  She has 2 daughters (ages 27 and 86).  The patient is alone today.  Allergies: No Known Allergies  Current Medications: Current Outpatient Prescriptions  Medication Sig Dispense Refill  . apixaban (ELIQUIS) 5 MG TABS tablet Take 1 tablet (5 mg total) by mouth 2 (two) times daily. 60 tablet 1  . rizatriptan (  MAXALT) 10 MG tablet Take 10 mg by mouth as needed.    . Vitamin D, Ergocalciferol, (DRISDOL) 50000 units CAPS capsule TAKE 1 CAPSULE (50,000 UNITS TOTAL) BY MOUTH EVERY 7 (SEVEN) DAYS. 12 capsule 0  . cyclobenzaprine (FLEXERIL) 5 MG tablet Take 1 tablet (5 mg total) by mouth 3 (three) times daily as needed for muscle spasms. (Patient not taking: Reported on 11/30/2016) 15 tablet 0   No current facility-administered medications for this visit.     Review of Systems:  GENERAL:  Fatigue.  Sweats.  No fevers.  Weight loss of 12 pounds in 3 weeks (see HPI) with some return of weight. PERFORMANCE STATUS (ECOG):  1 HEENT:  No visual  changes, runny nose, sore throat, mouth sores or tenderness. Lungs: No shortness of breath or cough.  No hemoptysis. Cardiac:  No chest pain, palpitations, orthopnea, or PND. GI:  Diarrhea after eating (see HPI), now resolved.  No nausea, vomiting, constipation, melena or hematochezia. GU:  No urgency, frequency, dysuria, or hematuria. Musculoskeletal:  Pain in left calf.  No back pain.  No joint pain.  No muscle tenderness. Extremities:  No pain or swelling. Skin:  No rashes or skin changes. Neuro:  Migraine headaches.  No numbness or weakness, balance or coordination issues. Endocrine:  No diabetes, thyroid issues, hot flashes or night sweats. Psych:  No mood changes, depression or anxiety. Pain:  No focal pain. Review of systems:  All other systems reviewed and found to be negative.  Physical Exam: Blood pressure 104/67, pulse 76, temperature (!) 97 F (36.1 C), temperature source Tympanic, resp. rate 18, weight 170 lb 9 oz (77.4 kg). GENERAL:  Well developed, well nourished, woman sitting comfortably in the exam room in no acute distress. MENTAL STATUS:  Alert and oriented to person, place and time. HEAD:  Brown hair.  Normocephalic, atraumatic, face symmetric, no Cushingoid features. EYES:  Brown eyes.  Pupils equal round and reactive to light and accomodation.  No conjunctivitis or scleral icterus. ENT:  Oropharynx clear without lesion.  Tongue normal. Mucous membranes moist.  RESPIRATORY:  Clear to auscultation without rales, wheezes or rhonchi. CARDIOVASCULAR:  Regular rate and rhythm without murmur, rub or gallop. ABDOMEN:  Soft, non-tender, with active bowel sounds, and no hepatosplenomegaly.  No masses. SKIN:  No rashes, ulcers or lesions. EXTREMITIES: No edema, no skin discoloration or tenderness.  No palpable cords. LYMPH NODES: No palpable cervical, supraclavicular, axillary or inguinal adenopathy  NEUROLOGICAL: Unremarkable. PSYCH:  Appropriate.   No visits with results  within 3 Day(s) from this visit.  Latest known visit with results is:  Orders Only on 10/24/2016  Component Date Value Ref Range Status  . Total Protein, Urine 10/22/2016 19.3  Not Estab. mg/dL Final  . Total Protein, Urine-Ur/day 10/22/2016 87  30 - 150 mg/24 hr Final  . Albumin, U 10/22/2016 28.8  % Final  . ALPHA 1 URINE 10/22/2016 4.2  % Final  . Alpha 2, Urine 10/22/2016 15.8  % Final  . % BETA, Urine 10/22/2016 24.6  % Final  . GAMMA GLOBULIN URINE 10/22/2016 26.6  % Final  . M-SPIKE %, Urine 10/22/2016 Not Observed  Not Observed % Final  . Immunofixation Result, Urine 10/22/2016 Comment   Final   An apparent normal immunofixation pattern.  . Note: 10/22/2016 Comment   Final   Comment: (NOTE) Protein electrophoresis scan will follow via computer, mail, or courier delivery. Performed At: [facility] [address] [place], [place] [id number] Lindon Romp  MD [id number]   . Total Volume 10/22/2016 450   Final    Assessment:  Tammy Garza is a 36 y.o. female with a left lower extremity DVT following a 2 1/2 hour flight.  She has been on DepoProvera x 1 year.  She has a family history of thrombosis.  Left lower extremity duplex on 09/17/2016 revealed an acute appearing thrombus in the calf veins of the left lower extremity (posterior tibial and peroneal veins).  Bilateral lower extremity duplex on 11/20/2016 revealed no evidence of acute DVT within either lower extremity. There was some residual nonocclusive thrombus in the left perineal vein, sequela of previous DVT on the prior scan.  She is on Eliquis.    Hypercoagulable work-up on 09/26/2016 revealed the following negative studies:  Factor V Leiden, prothrombin gene mutation, lupus anticoagulant panel, anticardiolipin antibodies, beta2-glycoprotein,   She has a monoclonal gammopathy.  SPEP on 09/26/2016 was negative.  Immunofixation revealed an IgA monoclonal protein with lambda light chain specificity.  Free  light chain assay was normal.  IgG was 698 ([phone]).  IgA was 182 (87-352).  24 hour UPEP on 10/22/2016 revealed no monoclonal protein.  Symptomatically, she has fatigue, sweats, and weight loss.  Exam reveals no adenopathy or hepatosplenomegaly.  Plan: 1.  Labs today:  CBC with diff, CMP, LDH, uric acid, protein S antigen/activity, protein C antigen/activity, AT III antigen/activity. 2.  Review initial hypercoagulable work-up.  Work-up negative to date.  Await additional testing today.  Anticipate 6 months of anticoagulation.  Check ultrasound prior to discontinuation. 3.  Discuss elevated serum protein.  SPEP was negative.  Immunofixation revealed an IgA monoclonal protein with lambda light chain specificity.  IgA and free light chains were normal.  No clinical evidence of lymphoma.  Discuss consideration of imaging secondary to fatigue, weight loss and sweats. 4.  Chest, adomen, and pelvic CT scan 5.  Discuss plans for follow-up labs (SPEP, free light chains, immunoglobulins) every 6 months. 6.  RTC after CT scans for MD assessment.   Lequita Asal, MD  11/30/2016, 9:13 AM

## 2016-11-30 NOTE — Progress Notes (Signed)
Patient offers no complaints today. 

## 2016-12-01 LAB — ANTITHROMBIN PANEL
AT III AG PPP IMM-ACNC: 94 % (ref 72–124)
Antithrombin Activity: 135 % (ref 75–135)

## 2016-12-01 LAB — PROTEIN C ACTIVITY: Protein C Activity: 49 % — ABNORMAL LOW (ref 73–180)

## 2016-12-01 LAB — PROTEIN S ACTIVITY: Protein S Activity: 91 % (ref 63–140)

## 2016-12-01 LAB — PROTEIN S, TOTAL: Protein S Ag, Total: 76 % (ref 60–150)

## 2016-12-04 LAB — PROTEIN C, TOTAL: Protein C, Total: 40 % — ABNORMAL LOW (ref 60–150)

## 2016-12-06 ENCOUNTER — Telehealth: Payer: Self-pay | Admitting: *Deleted

## 2016-12-06 NOTE — Telephone Encounter (Signed)
Called patient to inform her that her labs were all good with the exception of Protein C which is a little low.  If MD has any recommendations we will call back.

## 2016-12-06 NOTE — Telephone Encounter (Signed)
-----   Message from Secundino Ginger sent at 12/06/2016  9:43 AM EDT ----- Regarding: results Contact: [phone] This pt called for her results. She left a VM message.

## 2016-12-07 ENCOUNTER — Ambulatory Visit (INDEPENDENT_AMBULATORY_CARE_PROVIDER_SITE_OTHER): Payer: 59 | Admitting: Obstetrics & Gynecology

## 2016-12-07 ENCOUNTER — Encounter: Payer: Self-pay | Admitting: Obstetrics & Gynecology

## 2016-12-07 VITALS — BP 102/70 | HR 74 | Ht 66.0 in | Wt 175.0 lb

## 2016-12-07 DIAGNOSIS — D252 Subserosal leiomyoma of uterus: Secondary | ICD-10-CM

## 2016-12-07 DIAGNOSIS — N920 Excessive and frequent menstruation with regular cycle: Secondary | ICD-10-CM

## 2016-12-07 NOTE — Patient Instructions (Signed)
Total Laparoscopic Hysterectomy °A total laparoscopic hysterectomy is a minimally invasive surgery to remove your uterus and cervix. This surgery is performed by making several small cuts (incisions) in your abdomen. It can also be done with a thin, lighted tube (laparoscope) inserted into two small incisions in your lower abdomen. Your fallopian tubes and ovaries can be removed (bilateral salpingo-oophorectomy) during this surgery as well. Benefits of minimally invasive surgery include: °· Less pain. °· Less risk of blood loss. °· Less risk of infection. °· Quicker return to normal activities. ° °Tell a health care provider about: °· Any allergies you have. °· All medicines you are taking, including vitamins, herbs, eye drops, creams, and over-the-counter medicines. °· Any problems you or family members have had with anesthetic medicines. °· Any blood disorders you have. °· Any surgeries you have had. °· Any medical conditions you have. °What are the risks? °Generally, this is a safe procedure. However, as with any procedure, complications can occur. Possible complications include: °· Bleeding. °· Blood clots in the legs or lung. °· Infection. °· Injury to surrounding organs. °· Problems with anesthesia. °· Early menopause symptoms (hot flashes, night sweats, insomnia). °· Risk of conversion to an open abdominal incision. ° °What happens before the procedure? °· Ask your health care provider about changing or stopping your regular medicines. °· Do not take aspirin or blood thinners (anticoagulants) for 1 week before the surgery or as told by your health care provider. °· Do not eat or drink anything for 8 hours before the surgery or as told by your health care provider. °· Quit smoking if you smoke. °· Arrange for a ride home after surgery and for someone to help you at home during recovery. °What happens during the procedure? °· You will be given antibiotic medicine. °· An IV tube will be placed in your arm. You  will be given medicine to make you sleep (general anesthetic). °· A gas (carbon dioxide) will be used to inflate your abdomen. This will allow your surgeon to look inside your abdomen, perform your surgery, and treat any other problems found if necessary. °· Three or four small incisions (often less than 1/2 inch) will be made in your abdomen. One of these incisions will be made in the area of your belly button (navel). The laparoscope will be inserted into the incision. Your surgeon will look through the laparoscope while doing your procedure. °· Other surgical instruments will be inserted through the other incisions. °· Your uterus may be removed through your vagina or cut into small pieces and removed through the small incisions. °· Your incisions will be closed. °What happens after the procedure? °· The gas will be released from inside your abdomen. °· You will be taken to the recovery area where a nurse will watch and check your progress. Once you are awake, stable, and taking fluids well, without other problems, you will return to your room or be allowed to go home. °· There is usually minimal discomfort following the surgery because the incisions are so small. °· You will be given pain medicine while you are in the hospital and for when you go home. °This information is not intended to replace advice given to you by your health care provider. Make sure you discuss any questions you have with your health care provider. °Document Released: 02/26/2007 Document Revised: 10/07/2015 Document Reviewed: 11/19/2012 °Elsevier Interactive Patient Education © 2017 [employer] ° °

## 2016-12-07 NOTE — Progress Notes (Signed)
PRE-OPERATIVE HISTORY AND PHYSICAL EXAM  HPI:  Tammy Garza is a 36 y.o. [phone] with periods and pain from having a fibroid uterus; she is being admitted for surgery related to abnormal uterine bleeding, fibroids and pelvic pain.  Also has severe menstrual migraines.  Periods are heavy for several days.  H/o DVT, unable to take hormones.  All options discussed, prefers hysterectomy.    PMHx: Past Medical History:  Diagnosis Date  . Cervical high risk human papillomavirus (HPV) DNA test positive   . Chronic gastritis   . Depression   . H. pylori infection   . History of attempted suicide   . History of gastric polyp   . Migraines   . Rash   . Shift work sleep disorder    Past Surgical History:  Procedure Laterality Date  . CHOLECYSTECTOMY    . TUBAL LIGATION     Family History  Problem Relation Age of Onset  . HIV Sister   . Cancer Sister 29       cervical cancer  . Asthma Daughter   . Hypertension Maternal Grandmother   . Hypertension Mother   . Obesity Mother   . Cancer Father 56       lung cancer  . Deep vein thrombosis Father   . COPD Paternal Grandmother   . Deep vein thrombosis Paternal Grandmother    Social History  Substance Use Topics  . Smoking status: Former Smoker    Packs/day: 0.25    Years: 5.00    Types: Cigarettes    Quit date: 05/15/2012  . Smokeless tobacco: Never Used  . Alcohol use 0.6 oz/week    1 Glasses of wine per week    Current Outpatient Prescriptions:  .  apixaban (ELIQUIS) 5 MG TABS tablet, Take 1 tablet (5 mg total) by mouth 2 (two) times daily., Disp: 60 tablet, Rfl: 1 .  rizatriptan (MAXALT) 10 MG tablet, Take 10 mg by mouth as needed for migraine. , Disp: , Rfl:  .  Vitamin D, Ergocalciferol, (DRISDOL) 50000 units CAPS capsule, TAKE 1 CAPSULE (50,000 UNITS TOTAL) BY MOUTH EVERY 7 (SEVEN) DAYS., Disp: 12 capsule, Rfl: 0  Allergies: Patient has no known allergies.  Review of Systems  Constitutional: Negative for chills,  fever and malaise/fatigue.  HENT: Negative for congestion, sinus pain and sore throat.   Eyes: Negative for blurred vision and pain.  Respiratory: Negative for cough and wheezing.   Cardiovascular: Negative for chest pain and leg swelling.  Gastrointestinal: Negative for abdominal pain, constipation, diarrhea, heartburn, nausea and vomiting.  Genitourinary: Negative for dysuria, frequency, hematuria and urgency.  Musculoskeletal: Negative for back pain, joint pain, myalgias and neck pain.  Skin: Negative for itching and rash.  Neurological: Negative for dizziness, tremors and weakness.  Endo/Heme/Allergies: Does not bruise/bleed easily.  Psychiatric/Behavioral: Negative for depression. The patient is not nervous/anxious and does not have insomnia.     Objective: BP 102/70   Pulse 74   Ht 5\' 6"  (1.676 m)   Wt 175 lb (79.4 kg)   BMI 28.25 kg/m   Filed Weights   12/07/16 1526  Weight: 175 lb (79.4 kg)   Physical Exam  Constitutional: She is oriented to person, place, and time. She appears well-developed and well-nourished. No distress.  Genitourinary: Rectum normal, vagina normal and uterus normal. Pelvic exam was performed with patient supine. There is no rash or lesion on the right labia. There is no rash or lesion on the left labia.  Vagina exhibits no lesion. No bleeding in the vagina. Right adnexum does not display mass and does not display tenderness. Left adnexum does not display mass and does not display tenderness. Cervix does not exhibit motion tenderness, lesion, friability or polyp.   Uterus is mobile and midaxial. Uterus is not enlarged or exhibiting a mass.  HENT:  Head: Normocephalic and atraumatic. Head is without laceration.  Right Ear: Hearing normal.  Left Ear: Hearing normal.  Nose: No epistaxis.  No foreign bodies.  Mouth/Throat: Uvula is midline, oropharynx is clear and moist and mucous membranes are normal.  Eyes: Pupils are equal, round, and reactive to light.    Neck: Normal range of motion. Neck supple. No thyromegaly present.  Cardiovascular: Normal rate and regular rhythm.  Exam reveals no gallop and no friction rub.   No murmur heard. Pulmonary/Chest: Effort normal and breath sounds normal. No respiratory distress. She has no wheezes. Right breast exhibits no mass, no skin change and no tenderness. Left breast exhibits no mass, no skin change and no tenderness.  Abdominal: Soft. Bowel sounds are normal. She exhibits no distension. There is no tenderness. There is no rebound.  Musculoskeletal: Normal range of motion.  Neurological: She is alert and oriented to person, place, and time. No cranial nerve deficit.  Skin: Skin is warm and dry.  Psychiatric: She has a normal mood and affect. Judgment normal.  Vitals reviewed.   Assessment: 1. Menorrhagia with regular cycle   2. Subserous leiomyoma of uterus   All options d/w pt, prefers hysterectomy (preservation of ovaries).  I have had a careful discussion with this patient about all the options available and the risk/benefits of each. I have fully informed this patient that surgery may subject her to a variety of discomforts and risks: She understands that most patients have surgery with little difficulty, but problems can happen ranging from minor to fatal. These include nausea, vomiting, pain, bleeding, infection, poor healing, hernia, or formation of adhesions. Unexpected reactions may occur from any drug or anesthetic given. Unintended injury may occur to other pelvic or abdominal structures such as Fallopian tubes, ovaries, bladder, ureter (tube from kidney to bladder), or bowel. Nerves going from the pelvis to the legs may be injured. Any such injury may require immediate or later additional surgery to correct the problem. Excessive blood loss requiring transfusion is very unlikely but possible. Dangerous blood clots may form in the legs or lungs. Physical and sexual activity will be restricted in  varying degrees for an indeterminate period of time but most often 2-6 weeks.  Finally, she understands that it is impossible to list every possible undesirable effect and that the condition for which surgery is done is not always cured or significantly improved, and in rare cases may be even worse.Ample time was given to answer all questions.  Barnett Applebaum, MD, [facility], [facility] 12/07/2016  3:31 PM

## 2016-12-08 ENCOUNTER — Ambulatory Visit
Admission: RE | Admit: 2016-12-08 | Discharge: 2016-12-08 | Disposition: A | Discharge status: Home / Self Care | Payer: 59 | Source: Ambulatory Visit | Attending: Hematology and Oncology | Admitting: Hematology and Oncology

## 2016-12-08 DIAGNOSIS — R634 Abnormal weight loss: Secondary | ICD-10-CM

## 2016-12-08 DIAGNOSIS — I824Z9 Acute embolism and thrombosis of unspecified deep veins of unspecified distal lower extremity: Secondary | ICD-10-CM | POA: Diagnosis not present

## 2016-12-08 DIAGNOSIS — R61 Generalized hyperhidrosis: Secondary | ICD-10-CM | POA: Diagnosis not present

## 2016-12-08 DIAGNOSIS — R5383 Other fatigue: Secondary | ICD-10-CM

## 2016-12-08 DIAGNOSIS — D259 Leiomyoma of uterus, unspecified: Secondary | ICD-10-CM | POA: Insufficient documentation

## 2016-12-08 DIAGNOSIS — D472 Monoclonal gammopathy: Secondary | ICD-10-CM

## 2016-12-08 DIAGNOSIS — R197 Diarrhea, unspecified: Secondary | ICD-10-CM | POA: Diagnosis not present

## 2016-12-08 MED ORDER — IOPAMIDOL (ISOVUE-300) INJECTION 61%
100.0000 mL | Freq: Once | INTRAVENOUS | Status: AC | PRN
  Administered 2016-12-08: 100 mL via INTRAVENOUS

## 2016-12-10 DIAGNOSIS — D6859 Other primary thrombophilia: Secondary | ICD-10-CM | POA: Insufficient documentation

## 2016-12-10 NOTE — Progress Notes (Signed)
[facility] day:  12/11/2016   Chief Complaint: Tammy Garza is a 36 y.o. female with a DVT who is seen for review of interval scans and discussion regarding anticoagulation around upcoming surgery.  HPI:  The patient was last seen in the hematology clinic on 11/30/2016.  At that time, she noted sweats and some weight loss.  SPEP revealed an IgA monoclonal protein with lambda light chain specificity.  Decision was made to pursue imaging studies.  She underwent completion of her hypercoagulable work-up.  Chest, abdomen, and pelvic CT scan on 12/08/2016 revealed no lymphadenopathy or other acute findings.  She had a 3 cm uterine fibroid.  Labs on 11/30/2016 revealed a normal CBC with diff, CMP, LDH and uric acid.  Protein C total was 40% (60 - 150%).  Protein C activity 49% (73-180%).  Protein S total was 76% (60 - 150%).  Protein S activity was 91% (63 - 140%).  AT III antigen 94% (72 - 124%).  AT III activity 135% (75 - 135%).  She is scheduled for partial hysterectomy on 12/19/2016.  Symptomatically, she noted some transient left sided abdominal pain.  She has not had a bowel movement in 2 days.  She denies any nausea or vomiting.    She notes a family history of thrombosis.  Her father had a DVT in his 27s. Her paternal grandmother and a DVT in her 38s or 24s. A maternal aunt had a DVT. Two paternal uncles had clots. There is no family history of early pregnancy loss.    Past Medical History:  Diagnosis Date  . Cervical high risk human papillomavirus (HPV) DNA test positive   . Chronic gastritis   . Depression   . H. pylori infection   . History of attempted suicide   . History of gastric polyp   . Migraines   . Rash   . Shift work sleep disorder     Past Surgical History:  Procedure Laterality Date  . CHOLECYSTECTOMY    . TUBAL LIGATION      Family History  Problem Relation Age of Onset  . HIV Sister   . Cancer Sister 47   cervical cancer  . Asthma Daughter   . Hypertension Maternal Grandmother   . Hypertension Mother   . Obesity Mother   . Cancer Father 70       lung cancer  . Deep vein thrombosis Father   . COPD Paternal Grandmother   . Deep vein thrombosis Paternal Grandmother     Social History:  reports that she quit smoking about 4 years ago. Her smoking use included Cigarettes. She has a 1.25 pack-year smoking history. She has never used smokeless tobacco. She reports that she drinks about 0.6 oz of alcohol per week . She reports that she does not use drugs.  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  She wears a mask at work.  She has 2 daughters (ages 23 and 45).  The patient is alone today.  Allergies: No Known Allergies  Current Medications: Current Outpatient Prescriptions  Medication Sig Dispense Refill  . apixaban (ELIQUIS) 5 MG TABS tablet Take 1 tablet (5 mg total) by mouth 2 (two) times daily. 60 tablet 1  . rizatriptan (MAXALT) 10 MG tablet Take 10 mg by mouth as needed for migraine.     . Vitamin D, Ergocalciferol, (DRISDOL) 50000 units CAPS capsule TAKE 1 CAPSULE (50,000 UNITS TOTAL)  BY MOUTH EVERY 7 (SEVEN) DAYS. 12 capsule 0   No current facility-administered medications for this visit.     Review of Systems:  GENERAL:  Feels "ok".  Sweats.  No fevers.  Weight loss of 12 pounds in 3 weeks (see HPI) with some return of weight. PERFORMANCE STATUS (ECOG):  1 HEENT:  No visual changes, runny nose, sore throat, mouth sores or tenderness. Lungs: No shortness of breath or cough.  No hemoptysis. Cardiac:  No chest pain, palpitations, orthopnea, or PND. GI:  Transient left sided abdominal pain.  No nausea, vomiting, constipation, melena or hematochezia. GU:  No urgency, frequency, dysuria, or hematuria.  Planned partial hysterectomy. Musculoskeletal:  No back pain.  No joint pain.  No muscle tenderness. Extremities:  No pain or swelling. Skin:  No rashes  or skin changes. Neuro:  Migraine headaches.  No numbness or weakness, balance or coordination issues. Endocrine:  No diabetes, thyroid issues, hot flashes or night sweats. Psych:  No mood changes, depression or anxiety. Pain:  No focal pain. Review of systems:  All other systems reviewed and found to be negative.  Physical Exam: Blood pressure 114/71, pulse 71, temperature 98.1 F (36.7 C), temperature source Tympanic, resp. rate 18, weight 174 lb 7 oz (79.1 kg). GENERAL:  Well developed, well nourished, woman sitting comfortably in the exam room in no acute distress. MENTAL STATUS:  Alert and oriented to person, place and time. HEAD:  Brown hair.  Normocephalic, atraumatic, face symmetric, no Cushingoid features. EYES:  Brown eyes. No conjunctivitis or scleral icterus. NEUROLOGICAL: Unremarkable. PSYCH:  Appropriate.   No visits with results within 3 Day(s) from this visit.  Latest known visit with results is:  Appointment on 11/30/2016  Component Date Value Ref Range Status  . LDH 11/30/2016 109  98 - 192 U/L Final  . Uric Acid, Serum 11/30/2016 4.4  2.3 - 6.6 mg/dL Final  . Protein C, Total 11/30/2016 40* 60 - 150 % Final   Comment: (NOTE) A deficiency of protein C (PC), either congenital or acquired, increases the risk of thromboembolism. Congenital deficiencies of PC are very rare; acquired PC deficiency is much more common. PC levels can be transiently diminished after an acute thrombotic event. Oral anticoagulant therapy with warfarin will lower PC levels as well as vitamin K deficiency. Acquired deficiency can also occur in individuals with disseminated intravascular coagulation (DIC), sepsis, severe liver disease, nephrotic syndrome, and in inflammatory bowel disease. Levels may be spuriously decreased in individuals with Factor V Leiden. Drug therapy with L-asparaginse, fluorouracil, methotrexate, cyclophosphamide or tamoxifen can also reduce PC levels. It has been  suggested that repeat blood sampling and testing after ruling out acquired causes of deficiency should be performed before the patient is diagnosed with congenital Protein C deficiency. Performed At: [facility] [place], [place] [id number] Lindon Romp MD [id number]   . Protein S Activity 11/30/2016 91  63 - 140 % Final   Comment: (NOTE) Protein S activity may be falsely increased (masking an abnormal, low result) in patients receiving direct Xa inhibitor (e.g., rivaroxaban, apixaban, edoxaban) or a direct thrombin inhibitor (e.g., dabigatran) anticoagulant treatment due to assay interference by these drugs. Performed At: [facility] [place], [place] [id number] Lindon Romp MD [id number]   . Protein S Ag, Total 11/30/2016 76  60 - 150 % Final   Comment: (NOTE) This test was developed and its performance characteristics determined by [facility]. It has not been cleared or approved by the Food and Drug Administration. Performed At: [facility] [place], [place] [id number] Lindon Romp MD [id number]   . Antithrombin Activity 11/30/2016 135  75 - 135 % Final   Comment: (NOTE) Direct Xa inhibitor anticoagulants such as rivaroxaban, apixaban and edoxaban will lead to spuriously elevated antithrombin activity levels possibly masking a deficiency.   . AT III AG PPP IMM-ACNC 11/30/2016 94  72 - 124 % Final   Comment: (NOTE) This test was developed and its performance characteristics determined by [facility]. It has not been cleared or approved by the Food and Drug Administration. Performed At: [facility] [place], [place] [id number] Lindon Romp MD [id number]   . WBC 11/30/2016 6.9  3.6 - 11.0 K/uL Final  . RBC 11/30/2016 4.05  3.80 - 5.20 MIL/uL Final  . Hemoglobin 11/30/2016 12.8  12.0 - 16.0 g/dL Final  . HCT 11/30/2016 37.5  35.0 -  47.0 % Final  . MCV 11/30/2016 92.4  80.0 - 100.0 fL Final  . MCH 11/30/2016 31.5  26.0 - 34.0 pg Final  . MCHC 11/30/2016 34.1  32.0 - 36.0 g/dL Final  . RDW 11/30/2016 13.8  11.5 - 14.5 % Final  . Platelets 11/30/2016 262  150 - 440 K/uL Final  . Neutrophils Relative % 11/30/2016 47  % Final  . Neutro Abs 11/30/2016 3.2  1.4 - 6.5 K/uL Final  . Lymphocytes Relative 11/30/2016 40  % Final  . Lymphs Abs 11/30/2016 2.8  1.0 - 3.6 K/uL Final  . Monocytes Relative 11/30/2016 9  % Final  . Monocytes Absolute 11/30/2016 0.6  0.2 - 0.9 K/uL Final  . Eosinophils Relative 11/30/2016 3  % Final  . Eosinophils Absolute 11/30/2016 0.2  0 - 0.7 K/uL Final  . Basophils Relative 11/30/2016 1  % Final  . Basophils Absolute 11/30/2016 0.1  0 - 0.1 K/uL Final  . Sodium 11/30/2016 137  135 - 145 mmol/L Final  . Potassium 11/30/2016 4.0  3.5 - 5.1 mmol/L Final  . Chloride 11/30/2016 106  101 - 111 mmol/L Final  . CO2 11/30/2016 25  22 - 32 mmol/L Final  . Glucose, Bld 11/30/2016 88  65 - 99 mg/dL Final  . BUN 11/30/2016 10  6 - 20 mg/dL Final  . Creatinine, Ser 11/30/2016 0.73  0.44 - 1.00 mg/dL Final  . Calcium 11/30/2016 9.1  8.9 - 10.3 mg/dL Final  . Total Protein 11/30/2016 7.7  6.5 - 8.1 g/dL Final  . Albumin 11/30/2016 4.2  3.5 - 5.0 g/dL Final  . AST 11/30/2016 15  15 - 41 U/L Final  . ALT 11/30/2016 15  14 - 54 U/L Final  . Alkaline Phosphatase 11/30/2016 46  38 - 126 U/L Final  . Total Bilirubin 11/30/2016 0.8  0.3 - 1.2 mg/dL Final  . GFR calc non Af Amer 11/30/2016 >60  >60 mL/min Final  . GFR calc Af Amer 11/30/2016 >60  >60 mL/min Final   Comment: (NOTE) The eGFR has been calculated using the CKD EPI equation. This calculation has not been validated in all clinical situations. eGFR's persistently <60 mL/min signify possible Chronic Kidney Disease.   . Anion gap 11/30/2016 6  5 - 15 Final  . Protein C Activity 11/30/2016 49* 73 - 180 % Final   Comment: (NOTE)  A deficiency of protein  C (PC), either congenital or acquired, increases the risk of thromboembolism. Acquired PC deficiency occurs more frequently than congenital deficiency. PC levels can be transiently diminished after a thrombotic event or surgery or in the presence of certain anticoagulants. Heparin, direct Xa inhibitor, or thrombotic inhibitor therapy does not alter PC levels physiologically and does not interfere with this assay because it is chromogenic and clot-based. Vitamin K antagonist therapy may decrease plasma levels of functional protein C (PC) as PC is a vitamin K- dependent protein. Vitamin K deficiency, due to dietary insufficiency or malabsorption will also lead to reduced PC levels. Acquired deficiency can be found in individuals with disseminated intravascular coagulation (DIC) and sepsis. Severe hepatic disorders (hepatitis, cirrhosis, etc.), nephrotic syndrome, malignancy and inflammatory bowel disease can lead to diminished PC levels. Drug                           therapy with L-asparaginse or fluorouracil can also reduce PC levels. Levels may be decreased in patients with polycythemia vera, sickle cell disease and essential thrombocythemia. Repeat evaluation on a new plasma sample to confirm or refute this result should be considered, after ruling out acquired causes, depending on the clinical scenario. Performed At: [facility] [address] [place], [place] [id number] Lindon Romp MD [id number]     Assessment:  Tammy Garza is a 36 y.o. female with a left lower extremity DVT following a 2 1/2 hour flight.  She has been on DepoProvera x 1 year.  She has a family history of thrombosis.  Left lower extremity duplex on 09/17/2016 revealed an acute appearing thrombus in the calf veins of the left lower extremity (posterior tibial and peroneal veins).  Bilateral lower extremity duplex on 11/20/2016 revealed no evidence of acute DVT within either lower extremity. There  was some residual nonocclusive thrombus in the left perineal vein, sequela of previous DVT on the prior scan.  She is on Eliquis.    Hypercoagulable work-up on 09/26/2016 and 11/30/2016 revealed the following negative studies:  Factor V Leiden, prothrombin gene mutation, lupus anticoagulant panel, anticardiolipin antibodies, beta2-glycoprotein, protein S antigen/activity, and ATIII antigen/activity.  She may have a protein C deficiency.  Labs on 11/30/2016 revealed a protein C total of 40% (60 - 150%) and protein C activity of 49% (73-180%)  She has a monoclonal gammopathy of unknown significance.  SPEP on 09/26/2016 was negative.  Immunofixation revealed an IgA monoclonal protein with lambda light chain specificity.  Free light chain assay was normal.  IgG was 698 ([phone]).  IgA was 182 (87-352).  24 hour UPEP on 10/22/2016 revealed no monoclonal protein.    Chest, abdomen, and pelvic CT on 12/08/2016 revealed no lymphadenopathy or hepatosplenomegaly.  Symptomatically, she has fatigue, sweats, and weight loss.  Exam reveals no adenopathy or hepatosplenomegaly.  Plan: 1.  Review chest, abdomen and pelvic CT scan.  No evidence of lymphoma.  Discuss plan for monitoring MGUS every 6 months.  Patient to contact clinic is any interval concerns. 2.  Discuss completion of hypercoagulable work-up.  Protein C antigen and activity are low.  Discuss working diagnosis of protein C deficiency.  Discuss retesting in the future (3 months) when temporarily off anticoagulation.  If protein C deficiency confirmed, discuss life long anticoagulation. 3.  Discuss upcoming pelvic surgery (hysterectomy).  Patient currently on Eliquis.  Discuss plan to stop Eliquis 2 days before surgery (skip 4 doses: day -2 and day -  1).  No anticoagulation on morning of surgery (day 0).  Patient will restart anticoagulation when deemed appropriate per surgery. 4.  RN or MD to call patient this week re: surgery next week 5.  RTC in 1 month  for MD assessment.   Lequita Asal, MD  12/11/2016, 1:37 PM

## 2016-12-11 ENCOUNTER — Inpatient Hospital Stay (HOSPITAL_BASED_OUTPATIENT_CLINIC_OR_DEPARTMENT_OTHER): Payer: 59 | Admitting: Hematology and Oncology

## 2016-12-11 ENCOUNTER — Encounter: Payer: Self-pay | Admitting: Hematology and Oncology

## 2016-12-11 ENCOUNTER — Telehealth: Payer: Self-pay | Admitting: *Deleted

## 2016-12-11 ENCOUNTER — Other Ambulatory Visit: Payer: Self-pay | Admitting: Hematology and Oncology

## 2016-12-11 VITALS — BP 114/71 | HR 71 | Temp 98.1°F | Resp 18 | Wt 174.4 lb

## 2016-12-11 DIAGNOSIS — D6859 Other primary thrombophilia: Secondary | ICD-10-CM

## 2016-12-11 DIAGNOSIS — R5383 Other fatigue: Secondary | ICD-10-CM | POA: Diagnosis not present

## 2016-12-11 DIAGNOSIS — D472 Monoclonal gammopathy: Secondary | ICD-10-CM | POA: Diagnosis not present

## 2016-12-11 DIAGNOSIS — I824Z9 Acute embolism and thrombosis of unspecified deep veins of unspecified distal lower extremity: Secondary | ICD-10-CM

## 2016-12-11 DIAGNOSIS — Z79899 Other long term (current) drug therapy: Secondary | ICD-10-CM | POA: Diagnosis not present

## 2016-12-11 NOTE — Telephone Encounter (Signed)
Spoke with nancy at [facility], discussed that Dr. Mike Gip had dicussed with Dr. Kenton Kingfisher that it was ok to hold eliquis 2 days prior to surgery and for Dr. Kenton Kingfisher to resume when he felt was safe, voiced understanding.

## 2016-12-11 NOTE — Progress Notes (Signed)
Patient states she is having pain in her left abdomen 5/10.  Otherwise, no complaints.

## 2016-12-12 ENCOUNTER — Encounter: Payer: Self-pay | Admitting: *Deleted

## 2016-12-12 ENCOUNTER — Encounter
Admission: RE | Admit: 2016-12-12 | Discharge: 2016-12-12 | Disposition: A | Discharge status: Home / Self Care | Payer: 59 | Source: Ambulatory Visit | Attending: Obstetrics & Gynecology | Admitting: Obstetrics & Gynecology

## 2016-12-12 DIAGNOSIS — D252 Subserosal leiomyoma of uterus: Secondary | ICD-10-CM | POA: Diagnosis not present

## 2016-12-12 DIAGNOSIS — Z01818 Encounter for other preprocedural examination: Secondary | ICD-10-CM | POA: Diagnosis not present

## 2016-12-12 DIAGNOSIS — G43839 Menstrual migraine, intractable, without status migrainosus: Secondary | ICD-10-CM | POA: Diagnosis not present

## 2016-12-12 DIAGNOSIS — R102 Pelvic and perineal pain: Secondary | ICD-10-CM | POA: Diagnosis not present

## 2016-12-12 DIAGNOSIS — N920 Excessive and frequent menstruation with regular cycle: Secondary | ICD-10-CM | POA: Diagnosis not present

## 2016-12-12 HISTORY — DX: Acute embolism and thrombosis of unspecified deep veins of unspecified lower extremity: I82.409

## 2016-12-12 HISTORY — DX: Personal history of urinary calculi: Z87.442

## 2016-12-12 HISTORY — DX: Other primary thrombophilia: D68.59

## 2016-12-12 HISTORY — DX: Anemia, unspecified: D64.9

## 2016-12-12 LAB — CBC
HCT: 41.7 % (ref 35.0–47.0)
Hemoglobin: 13.8 g/dL (ref 12.0–16.0)
MCH: 30.5 pg (ref 26.0–34.0)
MCHC: 33 g/dL (ref 32.0–36.0)
MCV: 92.3 fL (ref 80.0–100.0)
Platelets: 262 10*3/uL (ref 150–440)
RBC: 4.52 MIL/uL (ref 3.80–5.20)
RDW: 13.7 % (ref 11.5–14.5)
WBC: 8.1 10*3/uL (ref 3.6–11.0)

## 2016-12-12 LAB — TYPE AND SCREEN
ABO/RH(D): B POS
Antibody Screen: NEGATIVE

## 2016-12-12 NOTE — Patient Instructions (Signed)
  Your procedure is scheduled on: 12-19-16 TUESDAY Report to Same Day Surgery 2nd floor medical mall [facility] Entrance-take elevator on left to 2nd floor.  Check in with surgery information desk.) To find out your arrival time please call [phone] between 1PM - 3PM on 12-18-16 MONDAY  Remember: Instructions that are not followed completely may result in serious medical risk, up to and including death, or upon the discretion of your surgeon and anesthesiologist your surgery may need to be rescheduled.    _x___ 1. Do not eat food or drink liquids after midnight. No gum chewing or hard candies.     __x__ 2. No Alcohol for 24 hours before or after surgery.   __x__3. No Smoking for 24 prior to surgery.   ____  4. Bring all medications with you on the day of surgery if instructed.    __x__ 5. Notify your doctor if there is any change in your medical condition     (cold, fever, infections).     Do not wear jewelry, make-up, hairpins, clips or nail polish.  Do not wear lotions, powders, or perfumes. You may wear deodorant.  Do not shave 48 hours prior to surgery. Men may shave face and neck.  Do not bring valuables to the hospital.    [facility] is not responsible for any belongings or valuables.               Contacts, dentures or bridgework may not be worn into surgery.  Leave your suitcase in the car. After surgery it may be brought to your room.  For patients admitted to the hospital, discharge time is determined by your treatment team.   Patients discharged the day of surgery will not be allowed to drive home.  You will need someone to drive you home and stay with you the night of your procedure.    Please read over the following fact sheets that you were given:    ____ Take anti-hypertensive (unless it includes a diuretic), cardiac, seizure, asthma,     anti-reflux and psychiatric medicines. These include:  1. NONE  2.  3.  4.  5.  6.  ____Fleets enema or Magnesium  Citrate as directed.   _x___ Use CHG Soap or sage wipes as directed on instruction sheet   ____ Use inhalers on the day of surgery and bring to hospital day of surgery  ____ Stop Metformin and Janumet 2 days prior to surgery.    ____ Take 1/2 of usual insulin dose the night before surgery and none on the morning surgery.   _X___ Follow recommendations from Cardiologist, Pulmonologist or PCP regarding stopping Aspirin, Coumadin, Pllavix ,Eliquis, Effient, or Pradaxa, and Pletal-PT HAS BEEN INSTRUCTED TO STOP ELIQUIS 2 DAYS PRIOR TO SURGERY- LAST DOSE ON Saturday, AUGUST 4TH  X____Stop Anti-inflammatories such as Advil, Aleve, Ibuprofen, Motrin, Naproxen, Naprosyn, Goodies powders or aspirin products NOW-OK to take Tylenol    ____ Stop supplements until after surgery.     ____ Bring C-Pap to the hospital.

## 2016-12-18 MED ORDER — CEFOXITIN SODIUM-DEXTROSE 2-2.2 GM-% IV SOLR (PREMIX)
2.0000 g | Freq: Once | INTRAVENOUS | Status: AC
  Administered 2016-12-19: 2 g via INTRAVENOUS

## 2016-12-18 NOTE — Pre-Procedure Instructions (Signed)
Progress Notes Encounter Date: 12/11/2016 Lequita Asal, MD  Oncology    _0 [facility] copied text [place] day:  12/11/2016   Chief Complaint: Tammy Garza is a 36 y.o. female with a DVT who is seen for review of interval scans and discussion regarding anticoagulation around upcoming surgery.  HPI:  The patient was last seen in the hematology clinic on 11/30/2016.  At that time, she noted sweats and some weight loss.  SPEP revealed an IgA monoclonal protein with lambda light chain specificity.  Decision was made to pursue imaging studies.  She underwent completion of her hypercoagulable work-up.  Chest, abdomen, and pelvic CT scan on 12/08/2016 revealed no lymphadenopathy or other acute findings.  She had a 3 cm uterine fibroid.  Labs on 11/30/2016 revealed a normal CBC with diff, CMP, LDH and uric acid.  Protein C total was 40% (60 - 150%).  Protein C activity 49% (73-180%).  Protein S total was 76% (60 - 150%).  Protein S activity was 91% (63 - 140%).  AT III antigen 94% (72 - 124%).  AT III activity 135% (75 - 135%).  She is scheduled for partial hysterectomy on 12/19/2016.  Symptomatically, she noted some transient left sided abdominal pain.  She has not had a bowel movement in 2 days.  She denies any nausea or vomiting.    She notes a family history of thrombosis. Her father had a DVT in his 2s. Her paternal grandmother and a DVT in her 35s or 76s. A maternal aunt had a DVT. Two paternal uncles had clots. There is no family history of early pregnancy loss.        Past Medical History:  Diagnosis Date  . Cervical high risk human papillomavirus (HPV) DNA test positive   . Chronic gastritis   . Depression   . H. pylori infection   . History of attempted suicide   . History of gastric polyp   . Migraines   . Rash   . Shift work sleep disorder          Past Surgical History:  Procedure Laterality Date  .  CHOLECYSTECTOMY    . TUBAL LIGATION           Family History  Problem Relation Age of Onset  . HIV Sister   . Cancer Sister 41       cervical cancer  . Asthma Daughter   . Hypertension Maternal Grandmother   . Hypertension Mother   . Obesity Mother   . Cancer Father 53       lung cancer  . Deep vein thrombosis Father   . COPD Paternal Grandmother   . Deep vein thrombosis Paternal Grandmother     Social History:  reports that she quit smoking about 4 years ago. Her smoking use included Cigarettes. She has a 1.25 pack-year smoking history. She has never used smokeless tobacco. She reports that she drinks about 0.6 oz of alcohol per week . She reports that she does not use drugs.  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  She wears a mask at work.  She has 2 daughters (ages 11 and 7).  The patient is alone today.  Allergies: No Known Allergies  Current Medications:       Current Outpatient Prescriptions  Medication Sig Dispense Refill  . apixaban (ELIQUIS) 5 MG TABS tablet Take 1 tablet (5 mg total) by mouth 2 (  two) times daily. 60 tablet 1  . rizatriptan (MAXALT) 10 MG tablet Take 10 mg by mouth as needed for migraine.     . Vitamin D, Ergocalciferol, (DRISDOL) 50000 units CAPS capsule TAKE 1 CAPSULE (50,000 UNITS TOTAL) BY MOUTH EVERY 7 (SEVEN) DAYS. 12 capsule 0   No current facility-administered medications for this visit.     Review of Systems:  GENERAL:  Feels "ok".  Sweats.  No fevers.  Weight loss of 12 pounds in 3 weeks (see HPI) with some return of weight. PERFORMANCE STATUS (ECOG):  1 HEENT:  No visual changes, runny nose, sore throat, mouth sores or tenderness. Lungs: No shortness of breath or cough.  No hemoptysis. Cardiac:  No chest pain, palpitations, orthopnea, or PND. GI:  Transient left sided abdominal pain.  No nausea, vomiting, constipation, melena or hematochezia. GU:  No urgency, frequency,  dysuria, or hematuria.  Planned partial hysterectomy. Musculoskeletal:  No back pain.  No joint pain.  No muscle tenderness. Extremities:  No pain or swelling. Skin:  No rashes or skin changes. Neuro:  Migraine headaches.  No numbness or weakness, balance or coordination issues. Endocrine:  No diabetes, thyroid issues, hot flashes or night sweats. Psych:  No mood changes, depression or anxiety. Pain:  No focal pain. Review of systems:  All other systems reviewed and found to be negative.  Physical Exam: Blood pressure 114/71, pulse 71, temperature 98.1 F (36.7 C), temperature source Tympanic, resp. rate 18, weight 174 lb 7 oz (79.1 kg). GENERAL:  Well developed, well nourished, woman sitting comfortably in the exam room in no acute distress. MENTAL STATUS:  Alert and oriented to person, place and time. HEAD:  Brown hair.  Normocephalic, atraumatic, face symmetric, no Cushingoid features. EYES:  Brown eyes. No conjunctivitis or scleral icterus. NEUROLOGICAL: Unremarkable. PSYCH:  Appropriate.          No visits with results within 3 Day(s) from this visit.  Latest known visit with results is:  Appointment on 11/30/2016  Component Date Value Ref Range Status  . LDH 11/30/2016 109  98 - 192 U/L Final  . Uric Acid, Serum 11/30/2016 4.4  2.3 - 6.6 mg/dL Final  . Protein C, Total 11/30/2016 40* 60 - 150 % Final   Comment: (NOTE) A deficiency of protein C (PC), either congenital or acquired, increases the risk of thromboembolism. Congenital deficiencies of PC are very rare; acquired PC deficiency is much more common. PC levels can be transiently diminished after an acute thrombotic event. Oral anticoagulant therapy with warfarin will lower PC levels as well as vitamin K deficiency. Acquired deficiency can also occur in individuals with disseminated intravascular coagulation (DIC), sepsis, severe liver disease, nephrotic syndrome, and in inflammatory bowel disease. Levels may be  spuriously decreased in individuals with Factor V Leiden. Drug therapy with L-asparaginse, fluorouracil, methotrexate, cyclophosphamide or tamoxifen can also reduce PC levels. It has been suggested that repeat blood sampling and testing after ruling out acquired causes of deficiency should be performed before the patient is diagnosed with congenital Protein C deficiency. Performed At: [facility] [place], [place] [id number] Lindon Romp MD [id number]   . Protein S Activity 11/30/2016 91  63 - 140 % Final   Comment: (NOTE) Protein S activity may be falsely increased (masking an abnormal, low result) in patients receiving direct  Xa inhibitor (e.g., rivaroxaban, apixaban, edoxaban) or a direct thrombin inhibitor (e.g., dabigatran) anticoagulant treatment due to assay interference by these drugs. Performed At: [facility] [place], [place] [id number] Lindon Romp MD [id number]   . Protein S Ag, Total 11/30/2016 76  60 - 150 % Final   Comment: (NOTE) This test was developed and its performance characteristics determined by [facility]. It has not been cleared or approved by the Food and Drug Administration. Performed At: [facility] [place], [place] [id number] Lindon Romp MD [id number]   . Antithrombin Activity 11/30/2016 135  75 - 135 % Final   Comment: (NOTE) Direct Xa inhibitor anticoagulants such as rivaroxaban, apixaban and edoxaban will lead to spuriously elevated antithrombin activity levels possibly masking a deficiency.   . AT III AG PPP IMM-ACNC 11/30/2016 94  72 - 124 % Final   Comment: (NOTE) This test was developed and its performance characteristics determined by [facility]. It has not been cleared or approved by the Food and Drug Administration. Performed At: [facility] [place], [place] [id number] Lindon Romp MD  [id number]   . WBC 11/30/2016 6.9  3.6 - 11.0 K/uL Final  . RBC 11/30/2016 4.05  3.80 - 5.20 MIL/uL Final  . Hemoglobin 11/30/2016 12.8  12.0 - 16.0 g/dL Final  . HCT 11/30/2016 37.5  35.0 - 47.0 % Final  . MCV 11/30/2016 92.4  80.0 - 100.0 fL Final  . MCH 11/30/2016 31.5  26.0 - 34.0 pg Final  . MCHC 11/30/2016 34.1  32.0 - 36.0 g/dL Final  . RDW 11/30/2016 13.8  11.5 - 14.5 % Final  . Platelets 11/30/2016 262  150 - 440 K/uL Final  . Neutrophils Relative % 11/30/2016 47  % Final  . Neutro Abs 11/30/2016 3.2  1.4 - 6.5 K/uL Final  . Lymphocytes Relative 11/30/2016 40  % Final  . Lymphs Abs 11/30/2016 2.8  1.0 - 3.6 K/uL Final  . Monocytes Relative 11/30/2016 9  % Final  . Monocytes Absolute 11/30/2016 0.6  0.2 - 0.9 K/uL Final  . Eosinophils Relative 11/30/2016 3  % Final  . Eosinophils Absolute 11/30/2016 0.2  0 - 0.7 K/uL Final  . Basophils Relative 11/30/2016 1  % Final  . Basophils Absolute 11/30/2016 0.1  0 - 0.1 K/uL Final  . Sodium 11/30/2016 137  135 - 145 mmol/L Final  . Potassium 11/30/2016 4.0  3.5 - 5.1 mmol/L Final  . Chloride 11/30/2016 106  101 - 111 mmol/L Final  . CO2 11/30/2016 25  22 - 32 mmol/L Final  . Glucose, Bld 11/30/2016 88  65 - 99 mg/dL Final  . BUN 11/30/2016 10  6 - 20 mg/dL Final  . Creatinine, Ser 11/30/2016 0.73  0.44 - 1.00 mg/dL Final  . Calcium 11/30/2016 9.1  8.9 - 10.3 mg/dL Final  . Total Protein 11/30/2016 7.7  6.5 - 8.1 g/dL Final  . Albumin 11/30/2016 4.2  3.5 - 5.0 g/dL Final  . AST 11/30/2016 15  15 - 41 U/L Final  . ALT 11/30/2016 15  14 - 54 U/L Final  . Alkaline Phosphatase 11/30/2016 46  38 - 126 U/L Final  . Total Bilirubin 11/30/2016 0.8  0.3 - 1.2 mg/dL Final  . GFR calc non Af Amer 11/30/2016 >60  >60 mL/min Final  . GFR calc Af Amer 11/30/2016 >60  >60 mL/min Final   Comment: (NOTE) The eGFR has been calculated using the CKD EPI  equation. This calculation has not been validated in all clinical situations. eGFR's  persistently <60 mL/min signify possible Chronic Kidney Disease.   . Anion gap 11/30/2016 6  5 - 15 Final  . Protein C Activity 11/30/2016 49* 73 - 180 % Final   Comment: (NOTE) A deficiency of protein C (PC), either congenital or acquired, increases the risk of thromboembolism. Acquired PC deficiency occurs more frequently than congenital deficiency. PC levels can be transiently diminished after a thrombotic event or surgery or in the presence of certain anticoagulants. Heparin, direct Xa inhibitor, or thrombotic inhibitor therapy does not alter PC levels physiologically and does not interfere with this assay because it is chromogenic and clot-based. Vitamin K antagonist therapy may decrease plasma levels of functional protein C (PC) as PC is a vitamin K- dependent protein. Vitamin K deficiency, due to dietary insufficiency or malabsorption will also lead to reduced PC levels. Acquired deficiency can be found in individuals with disseminated intravascular coagulation (DIC) and sepsis. Severe hepatic disorders (hepatitis, cirrhosis, etc.), nephrotic syndrome, malignancy and inflammatory bowel disease can lead to diminished PC levels. Drug                           therapy with L-asparaginse or fluorouracil can also reduce PC levels. Levels may be decreased in patients with polycythemia vera, sickle cell disease and essential thrombocythemia. Repeat evaluation on a new plasma sample to confirm or refute this result should be considered, after ruling out acquired causes, depending on the clinical scenario. Performed At: [facility] [address] [place], [place] [id number] Lindon Romp MD [id number]     Assessment:  Tammy Garza is a 36 y.o. female with a left lower extremity DVT following a 2 1/2 hour flight.  She has been on DepoProvera x 1 year.  She has a family history of thrombosis.  Left lower extremity duplex on 09/17/2016 revealed an acute appearing  thrombus in the calf veins of the left lower extremity (posterior tibial and peroneal veins).  Bilateral lower extremity duplex on 11/20/2016 revealed no evidence of acute DVT within either lower extremity. There was some residual nonocclusive thrombus in the left perineal vein, sequela of previous DVT on the prior scan.  She is on Eliquis.    Hypercoagulable work-up on 09/26/2016 and 11/30/2016 revealed the following negative studies:  Factor V Leiden, prothrombin gene mutation, lupus anticoagulant panel, anticardiolipin antibodies, beta2-glycoprotein, protein S antigen/activity, and ATIII antigen/activity.  She may have a protein C deficiency.  Labs on 11/30/2016 revealed a protein C total of 40% (60 - 150%) and protein C activity of 49% (73-180%)  She has a monoclonal gammopathy of unknown significance.  SPEP on 09/26/2016 was negative.  Immunofixation revealed an IgA monoclonal protein with lambda light chain specificity.  Free light chain assay was normal.  IgG was 698 ([phone]).  IgA was 182 (87-352).  24 hour UPEP on 10/22/2016 revealed no monoclonal protein.    Chest, abdomen, and pelvic CT on 12/08/2016 revealed no lymphadenopathy or hepatosplenomegaly.  Symptomatically, she has fatigue, sweats, and weight loss.  Exam reveals no adenopathy or hepatosplenomegaly.  Plan: 1.  Review chest, abdomen and pelvic CT scan.  No evidence of lymphoma.  Discuss plan for monitoring MGUS every 6 months.  Patient to contact clinic is any interval concerns. 2.  Discuss completion of hypercoagulable work-up.  Protein C antigen and activity are low.  Discuss working diagnosis of protein C deficiency.  Discuss retesting  in the future (3 months) when temporarily off anticoagulation.  If protein C deficiency confirmed, discuss life long anticoagulation. 3.  Discuss upcoming pelvic surgery (hysterectomy).  Patient currently on Eliquis.  Discuss plan to stop Eliquis 2 days before surgery (skip 4 doses: day -2  and day -1).  No anticoagulation on morning of surgery (day 0).  Patient will restart anticoagulation when deemed appropriate per surgery. 4.  RN or MD to call patient this week re: surgery next week 5.  RTC in 1 month for MD assessment.   Lequita Asal, MD  12/11/2016, 1:37 PM     Electronically signed by Lequita Asal, MD at 12/11/2016 6:44 PM      Office Visit on 12/11/2016        Detailed Report

## 2016-12-19 ENCOUNTER — Ambulatory Visit: Payer: 59 | Admitting: Anesthesiology

## 2016-12-19 ENCOUNTER — Encounter
Admission: RE | Disposition: A | Discharge status: Home / Self Care | Payer: Self-pay | Source: Ambulatory Visit | Attending: Obstetrics & Gynecology

## 2016-12-19 ENCOUNTER — Ambulatory Visit
Admission: RE | Admit: 2016-12-19 | Discharge: 2016-12-19 | Disposition: A | Discharge status: Home / Self Care | Payer: 59 | Source: Ambulatory Visit | Attending: Obstetrics & Gynecology | Admitting: Obstetrics & Gynecology

## 2016-12-19 DIAGNOSIS — I739 Peripheral vascular disease, unspecified: Secondary | ICD-10-CM | POA: Diagnosis not present

## 2016-12-19 DIAGNOSIS — D252 Subserosal leiomyoma of uterus: Secondary | ICD-10-CM | POA: Diagnosis not present

## 2016-12-19 DIAGNOSIS — D219 Benign neoplasm of connective and other soft tissue, unspecified: Secondary | ICD-10-CM | POA: Diagnosis present

## 2016-12-19 DIAGNOSIS — N800 Endometriosis of uterus: Secondary | ICD-10-CM | POA: Diagnosis not present

## 2016-12-19 DIAGNOSIS — Z87891 Personal history of nicotine dependence: Secondary | ICD-10-CM | POA: Diagnosis not present

## 2016-12-19 DIAGNOSIS — Z86718 Personal history of other venous thrombosis and embolism: Secondary | ICD-10-CM | POA: Diagnosis not present

## 2016-12-19 DIAGNOSIS — F329 Major depressive disorder, single episode, unspecified: Secondary | ICD-10-CM | POA: Diagnosis not present

## 2016-12-19 DIAGNOSIS — R102 Pelvic and perineal pain: Secondary | ICD-10-CM | POA: Insufficient documentation

## 2016-12-19 DIAGNOSIS — Z79899 Other long term (current) drug therapy: Secondary | ICD-10-CM | POA: Insufficient documentation

## 2016-12-19 DIAGNOSIS — N920 Excessive and frequent menstruation with regular cycle: Secondary | ICD-10-CM | POA: Diagnosis not present

## 2016-12-19 DIAGNOSIS — D6859 Other primary thrombophilia: Secondary | ICD-10-CM | POA: Diagnosis not present

## 2016-12-19 DIAGNOSIS — G43829 Menstrual migraine, not intractable, without status migrainosus: Secondary | ICD-10-CM | POA: Diagnosis not present

## 2016-12-19 DIAGNOSIS — Z7901 Long term (current) use of anticoagulants: Secondary | ICD-10-CM | POA: Diagnosis not present

## 2016-12-19 HISTORY — PX: LAPAROSCOPIC HYSTERECTOMY: SHX1926

## 2016-12-19 HISTORY — PX: CYSTOSCOPY: SHX5120

## 2016-12-19 LAB — POCT PREGNANCY, URINE: Preg Test, Ur: NEGATIVE

## 2016-12-19 LAB — ABO/RH: ABO/RH(D): B POS

## 2016-12-19 SURGERY — HYSTERECTOMY, TOTAL, LAPAROSCOPIC
Anesthesia: General | Laterality: Bilateral

## 2016-12-19 MED ORDER — KETOROLAC TROMETHAMINE 30 MG/ML IJ SOLN
INTRAMUSCULAR | Status: AC
  Filled 2016-12-19: qty 1

## 2016-12-19 MED ORDER — ROCURONIUM BROMIDE 50 MG/5ML IV SOLN
INTRAVENOUS | Status: AC
  Filled 2016-12-19: qty 1

## 2016-12-19 MED ORDER — CEFOXITIN SODIUM-DEXTROSE 2-2.2 GM-% IV SOLR (PREMIX)
INTRAVENOUS | Status: AC
  Filled 2016-12-19: qty 50

## 2016-12-19 MED ORDER — MIDAZOLAM HCL 2 MG/2ML IJ SOLN
INTRAMUSCULAR | Status: DC | PRN
  Administered 2016-12-19: 2 mg via INTRAVENOUS

## 2016-12-19 MED ORDER — FENTANYL CITRATE (PF) 250 MCG/5ML IJ SOLN
INTRAMUSCULAR | Status: AC
  Filled 2016-12-19: qty 5

## 2016-12-19 MED ORDER — BUPIVACAINE HCL (PF) 0.5 % IJ SOLN
INTRAMUSCULAR | Status: AC
  Filled 2016-12-19: qty 30

## 2016-12-19 MED ORDER — BUPIVACAINE HCL (PF) 0.5 % IJ SOLN
INTRAMUSCULAR | Status: DC | PRN
  Administered 2016-12-19: 10 mL

## 2016-12-19 MED ORDER — PROPOFOL 10 MG/ML IV BOLUS
INTRAVENOUS | Status: DC | PRN
  Administered 2016-12-19: 30 mg via INTRAVENOUS
  Administered 2016-12-19: 120 mg via INTRAVENOUS

## 2016-12-19 MED ORDER — ACETAMINOPHEN 650 MG RE SUPP
650.0000 mg | RECTAL | Status: DC | PRN
  Filled 2016-12-19: qty 1

## 2016-12-19 MED ORDER — DEXAMETHASONE SODIUM PHOSPHATE 10 MG/ML IJ SOLN
INTRAMUSCULAR | Status: DC | PRN
  Administered 2016-12-19: 10 mg via INTRAVENOUS

## 2016-12-19 MED ORDER — OXYCODONE-ACETAMINOPHEN 5-325 MG PO TABS: 1.0000 | ORAL_TABLET | Freq: Four times a day (QID) | ORAL | 0 refills | Status: DC | PRN

## 2016-12-19 MED ORDER — FAMOTIDINE 20 MG PO TABS
ORAL_TABLET | ORAL | Status: AC
  Administered 2016-12-19: 20 mg via ORAL
  Filled 2016-12-19: qty 1

## 2016-12-19 MED ORDER — OXYCODONE-ACETAMINOPHEN 5-325 MG PO TABS
ORAL_TABLET | ORAL | Status: AC
  Filled 2016-12-19: qty 1

## 2016-12-19 MED ORDER — ONDANSETRON HCL 4 MG/2ML IJ SOLN
INTRAMUSCULAR | Status: DC | PRN
  Administered 2016-12-19: 4 mg via INTRAVENOUS

## 2016-12-19 MED ORDER — FAMOTIDINE 20 MG PO TABS
20.0000 mg | ORAL_TABLET | Freq: Once | ORAL | Status: AC
  Administered 2016-12-19: 20 mg via ORAL

## 2016-12-19 MED ORDER — ACETAMINOPHEN 10 MG/ML IV SOLN
INTRAVENOUS | Status: DC | PRN
  Administered 2016-12-19: 1000 mg via INTRAVENOUS

## 2016-12-19 MED ORDER — ACETAMINOPHEN NICU IV SYRINGE 10 MG/ML
INTRAVENOUS | Status: AC
  Filled 2016-12-19: qty 1

## 2016-12-19 MED ORDER — FENTANYL CITRATE (PF) 100 MCG/2ML IJ SOLN
INTRAMUSCULAR | Status: DC | PRN
  Administered 2016-12-19 (×2): 50 ug via INTRAVENOUS
  Administered 2016-12-19: 100 ug via INTRAVENOUS
  Administered 2016-12-19 (×2): 50 ug via INTRAVENOUS
  Administered 2016-12-19 (×2): 100 ug via INTRAVENOUS

## 2016-12-19 MED ORDER — MIDAZOLAM HCL 2 MG/2ML IJ SOLN
INTRAMUSCULAR | Status: AC
  Filled 2016-12-19: qty 2

## 2016-12-19 MED ORDER — MORPHINE SULFATE (PF) 4 MG/ML IV SOLN: 1.0000 mg | INTRAVENOUS | Status: DC | PRN

## 2016-12-19 MED ORDER — PROPOFOL 10 MG/ML IV BOLUS
INTRAVENOUS | Status: AC
  Filled 2016-12-19: qty 20

## 2016-12-19 MED ORDER — LACTATED RINGERS IV SOLN
INTRAVENOUS | Status: DC
  Administered 2016-12-19: 10:00:00 via INTRAVENOUS

## 2016-12-19 MED ORDER — ACETAMINOPHEN 325 MG PO TABS: 650.0000 mg | ORAL_TABLET | ORAL | Status: DC | PRN

## 2016-12-19 MED ORDER — FENTANYL CITRATE (PF) 100 MCG/2ML IJ SOLN
INTRAMUSCULAR | Status: AC
  Administered 2016-12-19: 25 ug
  Filled 2016-12-19: qty 2

## 2016-12-19 MED ORDER — OXYCODONE-ACETAMINOPHEN 5-325 MG PO TABS
1.0000 | ORAL_TABLET | Freq: Four times a day (QID) | ORAL | Status: DC | PRN
  Administered 2016-12-19: 1 via ORAL

## 2016-12-19 MED ORDER — ONDANSETRON HCL 4 MG/2ML IJ SOLN
INTRAMUSCULAR | Status: AC
  Filled 2016-12-19: qty 2

## 2016-12-19 MED ORDER — ROCURONIUM BROMIDE 100 MG/10ML IV SOLN
INTRAVENOUS | Status: DC | PRN
  Administered 2016-12-19: 40 mg via INTRAVENOUS
  Administered 2016-12-19 (×2): 10 mg via INTRAVENOUS

## 2016-12-19 MED ORDER — SUGAMMADEX SODIUM 200 MG/2ML IV SOLN
INTRAVENOUS | Status: DC | PRN
  Administered 2016-12-19: 157 mg via INTRAVENOUS

## 2016-12-19 SURGICAL SUPPLY — 53 items
ADH SKN CLS APL DERMABOND .7 (GAUZE/BANDAGES/DRESSINGS) ×2
APL SRG 38 LTWT LNG FL B (MISCELLANEOUS) ×2
APPLICATOR ARISTA FLEXITIP XL (MISCELLANEOUS) ×2 IMPLANT
BAG URO DRAIN 2000ML W/SPOUT (MISCELLANEOUS) ×3 IMPLANT
BLADE SURG SZ11 CARB STEEL (BLADE) ×3 IMPLANT
CANISTER SUCT 1200ML W/VALVE (MISCELLANEOUS) ×3 IMPLANT
CATH FOLEY 2WAY  5CC 16FR (CATHETERS) ×1
CATH FOLEY 2WAY 5CC 16FR (CATHETERS) ×2
CATH URTH 16FR FL 2W BLN LF (CATHETERS) ×2 IMPLANT
CHLORAPREP W/TINT 26ML (MISCELLANEOUS) ×3 IMPLANT
DEFOGGER SCOPE WARMER CLEARIFY (MISCELLANEOUS) ×3 IMPLANT
DERMABOND ADVANCED (GAUZE/BANDAGES/DRESSINGS) ×1
DERMABOND ADVANCED .7 DNX12 (GAUZE/BANDAGES/DRESSINGS) ×2 IMPLANT
DEVICE SUTURE ENDOST 10MM (ENDOMECHANICALS) ×3 IMPLANT
DRAPE CAMERA CLOSED 9X96 (DRAPES) ×3 IMPLANT
DRSG TEGADERM 2-3/8X2-3/4 SM (GAUZE/BANDAGES/DRESSINGS) IMPLANT
ENDOSTITCH 0 SINGLE 48 (SUTURE) ×15 IMPLANT
GAUZE SPONGE NON-WVN 2X2 STRL (MISCELLANEOUS) IMPLANT
GLOVE BIO SURGEON STRL SZ8 (GLOVE) ×15 IMPLANT
GLOVE INDICATOR 8.0 STRL GRN (GLOVE) ×3 IMPLANT
GOWN STRL REUS W/ TWL LRG LVL3 (GOWN DISPOSABLE) ×2 IMPLANT
GOWN STRL REUS W/ TWL XL LVL3 (GOWN DISPOSABLE) ×4 IMPLANT
GOWN STRL REUS W/TWL LRG LVL3 (GOWN DISPOSABLE) ×3
GOWN STRL REUS W/TWL XL LVL3 (GOWN DISPOSABLE) ×6
GRASPER SUT TROCAR 14GX15 (MISCELLANEOUS) ×3 IMPLANT
HEMOSTAT ARISTA ABSORB 3G PWDR (MISCELLANEOUS) ×1 IMPLANT
IRRIGATION STRYKERFLOW (MISCELLANEOUS) ×2 IMPLANT
IRRIGATOR STRYKERFLOW (MISCELLANEOUS) ×3
IV LACTATED RINGERS 1000ML (IV SOLUTION) ×3 IMPLANT
KIT PINK PAD W/HEAD ARE REST (MISCELLANEOUS) ×3
KIT PINK PAD W/HEAD ARM REST (MISCELLANEOUS) ×2 IMPLANT
KIT RM TURNOVER CYSTO AR (KITS) ×3 IMPLANT
LABEL OR SOLS (LABEL) ×3 IMPLANT
MANIPULATOR VCARE LG CRV RETR (MISCELLANEOUS) IMPLANT
MANIPULATOR VCARE SML CRV RETR (MISCELLANEOUS) IMPLANT
MANIPULATOR VCARE STD CRV RETR (MISCELLANEOUS) IMPLANT
NEEDLE VERESS 14GA 120MM (NEEDLE) ×3 IMPLANT
NS IRRIG 500ML POUR BTL (IV SOLUTION) ×3 IMPLANT
OCCLUDER COLPOPNEUMO (BALLOONS) ×3 IMPLANT
PACK GYN LAPAROSCOPIC (MISCELLANEOUS) ×3 IMPLANT
PAD OB MATERNITY 4.3X12.25 (PERSONAL CARE ITEMS) ×3 IMPLANT
PAD PREP 24X41 OB/GYN DISP (PERSONAL CARE ITEMS) ×3 IMPLANT
SCISSORS METZENBAUM CVD 33 (INSTRUMENTS) IMPLANT
SET CYSTO W/LG BORE CLAMP LF (SET/KITS/TRAYS/PACK) ×3 IMPLANT
SHEARS HARMONIC ACE PLUS 36CM (ENDOMECHANICALS) ×3 IMPLANT
SLEEVE ENDOPATH XCEL 5M (ENDOMECHANICALS) ×3 IMPLANT
SPONGE VERSALON 2X2 STRL (MISCELLANEOUS)
SUT VIC AB 0 CT1 36 (SUTURE) ×3 IMPLANT
SYR 50ML LL SCALE MARK (SYRINGE) ×3 IMPLANT
SYRINGE 10CC LL (SYRINGE) ×3 IMPLANT
TROCAR ENDO BLADELESS 11MM (ENDOMECHANICALS) ×3 IMPLANT
TROCAR XCEL NON-BLD 5MMX100MML (ENDOMECHANICALS) ×3 IMPLANT
TUBING INSUF HEATED (TUBING) ×3 IMPLANT

## 2016-12-19 NOTE — Op Note (Signed)
Operative Report:  PRE-OP DIAGNOSIS: MENORRHAGIA,PELVIC PAIN,FIBROID,MENSTRUAL MIGRAINE,   POST-OP DIAGNOSIS: MENORRHAGIA,PELVIC PAIN,FIBROID,MENSTRUAL MIGRAINE,   PROCEDURE: Procedure(s): HYSTERECTOMY TOTAL LAPAROSCOPIC with BILATERAL SALPINGECTOMY CYSTOSCOPY  SURGEON: Barnett Applebaum, MD, FACOG  ASSISTANT: Dr Georgianne Fick   ANESTHESIA: General endotracheal anesthesia  ESTIMATED BLOOD LOSS: 100 ML  SPECIMENS: Uterus, Tubes.  COMPLICATIONS: None  DISPOSITION: stable to PACU  FINDINGS: Intraabdominal adhesions were not noted. Fundal subserosal large fibroid.  PROCEDURE:  The patient was taken to the OR where anesthesia was administed. She was prepped and draped in the normal sterile fashion in the dorsal lithotomy position in the Burnham stirrups. A time out was performed. A Graves speculum was inserted, the cervix was grasped with a single tooth tenaculum and the endometrial cavity was sounded.  A V-Care uterine manipulator was inserted in the usual fashion without incident. Gloves were changed and attention was turned to the abdomen.   An infraumbilical transverse 65mm skin incision was made with the scalpel after local anesthesia applied to the skin. A Veress-step needle was inserted in the usual fashion and confirmed using the hanging drop technique. A pneumoperitoneum was obtained by insufflation of CO2 (opening pressure of 37mmHg) to 57mmHg. A diagnostic laparoscopy was performed yielding the previously described findings. Attention was turned to the left lower quadrant where after visualization of the inferior epigastric vessels a 79mm skin incision was made with the scalpel. A 5 mm laparoscopic port was inserted. The same procedure was repeated in the right lower quadrant with a 42mm trocar. Attention was turned to the left aspect of the uterus, where after visualization of the ureter, the round ligament was coagulated and transected using the 62mm Harmonic Scapel. The anterior and posterior  leafs of the broad ligament were dissected off as the anterior one was coagulated and transected in a caudal direction towards the cuff of the uterine manipulator.  Attention was then turned to the left fallopian tube which was recognized by visualization of the fimbria. The tube is excised to its attachment to the uterus. The uterine-ovarian ligament and its blood vessels were carefully coagulated and transected using the Harmonic scapel.  Attention was turned to the right aspect of the uterus where the same procedure was performed.  The vesicouterine reflection of the peritoneum was dissected with the harmonic scapel and the bladder flap was created bluntly.  The uterine vessels were coagulated and transected bilaterally using first bipolar cautery and then the harmonic scapel. A 360 degree, circumferential colpotomy was done to completely amputate the uterus with cervix and tubes. Once the specimen was amputated it was delivered through the vagina.   The colpotomy was repaired in a simple interrupted fashion using a 0-Polysorb suture with an endo-stitch device.  Vaginal exam confirms complete closure.  The cavity was copiously irrigated. A survey of the pelvic cavity revealed adequate hemostasis and no injury to bowel, bladder, or ureter.  Arista for hemostasis was utilized in the dissection area over the cuff and left side wall.   A diagnostic cystoscopy was performed using saline distension of bladder with no lesions or injuries noted.  Bilateral urine flow from each ureteral orifice is visualized.  At this point the procedure was finalized.  The fascia of the RLQ incision is closed with a 0 Vicryl suture using a fascia closure device.   All the instruments were removed from the patient's body. Gas was expelled and patient is leveled.  Incisions are closed with skin adhesive.    Patient goes to recovery room in stable condition.  All sponge, instrument, and needle counts are correct x2.     Barnett Applebaum, MD, [facility], [facility] 12/19/2016  12:28 PM

## 2016-12-19 NOTE — Anesthesia Post-op Follow-up Note (Signed)
Anesthesia QCDR form completed.        

## 2016-12-19 NOTE — H&P (Signed)
History and Physical Interval Note:  12/19/2016 10:09 AM  Tammy Garza  has presented today for surgery, with the diagnosis of MENORRHAGIA,PELVIC PAIN,FIBROID,MENSTRUAL MIGRAINE,  The various methods of treatment have been discussed with the patient and family. After consideration of risks, benefits and other options for treatment, the patient has consented to  Procedure(s): HYSTERECTOMY TOTAL LAPAROSCOPIC BILATERAL SALPINGECTOMY (Bilateral) as a surgical intervention .  The patient's history has been reviewed, patient examined, no change in status, stable for surgery.  Pt has the following beta blocker history-  Not taking Beta Blocker.  I have reviewed the patient's chart and labs.  Questions were answered to the patient's satisfaction.    Hoyt Koch

## 2016-12-19 NOTE — Discharge Instructions (Signed)
Total Laparoscopic Hysterectomy, Care After °Refer to this sheet in the next few weeks. These instructions provide you with information on caring for yourself after your procedure. Your health care provider may also give you more specific instructions. Your treatment has been planned according to current medical practices, but problems sometimes occur. Call your health care provider if you have any problems or questions after your procedure. °What can I expect after the procedure? °· Pain and bruising at the incision sites. You will be given pain medicine to control it. °· Menopausal symptoms such as hot flashes, night sweats, and insomnia if your ovaries were removed. °· Sore throat from the breathing tube that was inserted during surgery. °Follow these instructions at home: °· Only take over-the-counter or prescription medicines for pain, discomfort, or fever as directed by your health care provider. °· Do not take aspirin. It can cause bleeding. °· Do not drive when taking pain medicine. °· Follow your health care provider's advice regarding diet, exercise, lifting, driving, and general activities. °· Resume your usual diet as directed and allowed. °· Get plenty of rest and sleep. °· Do not douche, use tampons, or have sexual intercourse for at least 6 weeks, or until your health care provider gives you permission. °· Change your bandages (dressings) as directed by your health care provider. °· Monitor your temperature and notify your health care provider of a fever. °· Take showers instead of baths for 2-3 weeks. °· Do not drink alcohol until your health care provider gives you permission. °· If you develop constipation, you may take a mild laxative with your health care provider's permission. Bran foods may help with constipation problems. Drinking enough fluids to keep your urine clear or pale yellow may help as well. °· Try to have someone home with you for 1-2 weeks to help around the house. °· Keep all of  your follow-up appointments as directed by your health care provider. °Contact a health care provider if: °· You have swelling, redness, or increasing pain around your incision sites. °· You have pus coming from your incision. °· You notice a bad smell coming from your incision. °· Your incision breaks open. °· You feel dizzy or lightheaded. °· You have pain or bleeding when you urinate. °· You have persistent diarrhea. °· You have persistent nausea and vomiting. °· You have abnormal vaginal discharge. °· You have a rash. °· You have any type of abnormal reaction or develop an allergy to your medicine. °· You have poor pain control with your prescribed medicine. °Get help right away if: °· You have chest pain or shortness of breath. °· You have severe abdominal pain that is not relieved with pain medicine. °· You have pain or swelling in your legs. °This information is not intended to replace advice given to you by your health care provider. Make sure you discuss any questions you have with your health care provider. °Document Released: 02/19/2013 Document Revised: 10/07/2015 Document Reviewed: 11/19/2012 °Elsevier Interactive Patient Education © 2017 [employer] ° °AMBULATORY SURGERY  °DISCHARGE INSTRUCTIONS ° ° °1) The drugs that you were given will stay in your system until tomorrow so for the next 24 hours you should not: ° °A) Drive an automobile °B) Make any legal decisions °C) Drink any alcoholic beverage ° ° °2) You may resume regular meals tomorrow.  Today it is better to start with liquids and gradually work up to solid foods. ° °You may eat anything you prefer, but it is better   to start with liquids, then soup and crackers, and gradually work up to solid foods. ° ° °3) Please notify your doctor immediately if you have any unusual bleeding, trouble breathing, redness and pain at the surgery site, drainage, fever, or pain not relieved by medication. ° ° ° °4) Additional Instructions: ° ° ° ° ° ° ° °Please  contact your physician with any problems or Same Day Surgery at [phone], Monday through Friday 6 am to 4 pm, or [place] [facility] [place] Main number at [phone]. ° °

## 2016-12-19 NOTE — Anesthesia Preprocedure Evaluation (Signed)
Anesthesia Evaluation  Patient identified by MRN, date of birth, ID band Patient awake    Reviewed: Allergy & Precautions, H&P , NPO status , Patient's Chart, lab work & pertinent test results  History of Anesthesia Complications Negative for: history of anesthetic complications  Airway Mallampati: II  TM Distance: >3 FB Neck ROM: full    Dental  (+) Teeth Intact   Pulmonary neg shortness of breath, former smoker,           Cardiovascular Exercise Tolerance: Good (-) angina+ Peripheral Vascular Disease and + DVT  (-) Past MI and (-) DOE      Neuro/Psych  Headaches, PSYCHIATRIC DISORDERS Depression    GI/Hepatic negative GI ROS, Neg liver ROS, neg GERD  ,  Endo/Other  negative endocrine ROS  Renal/GU      Musculoskeletal   Abdominal   Peds  Hematology negative hematology ROS (+)   Anesthesia Other Findings Past Medical History: No date: Anemia     Comment:  h/o No date: Cervical high risk human papillomavirus (HPV) DNA test  positive No date: Chronic gastritis No date: Depression 09/2016: DVT (deep venous thrombosis) (HCC)     Comment:  left leg No date: H. pylori infection No date: History of attempted suicide No date: History of gastric polyp No date: History of kidney stones     Comment:  h/o No date: Migraines No date: Protein C deficiency (HCC) No date: Rash No date: Shift work sleep disorder  Past Surgical History: No date: CHOLECYSTECTOMY No date: TUBAL LIGATION  BMI    Body Mass Index:  28.08 kg/m      Reproductive/Obstetrics negative OB ROS                             Anesthesia Physical Anesthesia Plan  ASA: III  Anesthesia Plan: General ETT   Post-op Pain Management:    Induction: Intravenous  PONV Risk Score and Plan: 4 or greater and Ondansetron, Dexamethasone and Midazolam  Airway Management Planned: Oral ETT  Additional Equipment:   Intra-op  Plan:   Post-operative Plan: Extubation in OR  Informed Consent: I have reviewed the patients History and Physical, chart, labs and discussed the procedure including the risks, benefits and alternatives for the proposed anesthesia with the patient or authorized representative who has indicated his/her understanding and acceptance.   Dental Advisory Given  Plan Discussed with: Anesthesiologist, CRNA and Surgeon  Anesthesia Plan Comments: (Patient consented for risks of anesthesia including but not limited to:  - adverse reactions to medications - damage to teeth, lips or other oral mucosa - sore throat or hoarseness - Damage to heart, brain, lungs or loss of life  Patient voiced understanding.)        Anesthesia Quick Evaluation

## 2016-12-19 NOTE — Anesthesia Procedure Notes (Signed)
Procedure Name: Intubation Date/Time: 12/19/2016 10:40 AM Performed by: Lorie Apley Pre-anesthesia Checklist: Patient identified, Patient being monitored, Timeout performed, Emergency Drugs available and Suction available Patient Re-evaluated:Patient Re-evaluated prior to induction Oxygen Delivery Method: Circle system utilized Preoxygenation: Pre-oxygenation with 100% oxygen Induction Type: IV induction Ventilation: Mask ventilation without difficulty Laryngoscope Size: Mac and 3 Grade View: Grade I Tube type: Oral Tube size: 7.0 mm Number of attempts: 1 Airway Equipment and Method: Stylet Placement Confirmation: ETT inserted through vocal cords under direct vision,  positive ETCO2 and breath sounds checked- equal and bilateral Secured at: 21 cm Tube secured with: Tape Dental Injury: Teeth and Oropharynx as per pre-operative assessment

## 2016-12-19 NOTE — OR Nursing (Signed)
Clarified with Dr Kenton Kingfisher  r/t hx of dvt in left leg, apply ted hose no scd to lef tside.

## 2016-12-19 NOTE — Transfer of Care (Signed)
Immediate Anesthesia Transfer of Care Note  Patient: Tammy Garza  Procedure(s) Performed: Procedure(s): HYSTERECTOMY TOTAL LAPAROSCOPIC BILATERAL SALPINGECTOMY (Bilateral) CYSTOSCOPY  Patient Location: PACU  Anesthesia Type:General  Level of Consciousness: awake  Airway & Oxygen Therapy: Patient Spontanous Breathing and Patient connected to face mask oxygen  Post-op Assessment: Report given to RN and Post -op Vital signs reviewed and stable  Post vital signs: Reviewed and stable  Last Vitals:  Vitals:   12/19/16 0925 12/19/16 1243  BP: 121/85 115/69  Pulse: 64 89  Resp: 16 11  Temp: 36.6 C     Last Pain:  Vitals:   12/19/16 0925  TempSrc: Tympanic         Complications: No apparent anesthesia complications

## 2016-12-20 ENCOUNTER — Emergency Department
Admission: EM | Admit: 2016-12-20 | Discharge: 2016-12-21 | Disposition: A | Discharge status: Home / Self Care | Payer: 59 | Attending: Student in an Organized Health Care Education/Training Program | Admitting: Student in an Organized Health Care Education/Training Program

## 2016-12-20 DIAGNOSIS — Z87891 Personal history of nicotine dependence: Secondary | ICD-10-CM | POA: Insufficient documentation

## 2016-12-20 DIAGNOSIS — Y69 Unspecified misadventure during surgical and medical care: Secondary | ICD-10-CM | POA: Insufficient documentation

## 2016-12-20 DIAGNOSIS — R1030 Lower abdominal pain, unspecified: Secondary | ICD-10-CM

## 2016-12-20 DIAGNOSIS — G8918 Other acute postprocedural pain: Secondary | ICD-10-CM | POA: Diagnosis not present

## 2016-12-20 DIAGNOSIS — T819XXA Unspecified complication of procedure, initial encounter: Secondary | ICD-10-CM | POA: Diagnosis present

## 2016-12-20 DIAGNOSIS — R1084 Generalized abdominal pain: Secondary | ICD-10-CM | POA: Diagnosis not present

## 2016-12-20 DIAGNOSIS — Z7901 Long term (current) use of anticoagulants: Secondary | ICD-10-CM | POA: Diagnosis not present

## 2016-12-20 MED ORDER — PROMETHAZINE HCL 25 MG/ML IJ SOLN
12.5000 mg | Freq: Four times a day (QID) | INTRAMUSCULAR | Status: DC | PRN
  Administered 2016-12-21: 12.5 mg via INTRAVENOUS
  Filled 2016-12-20: qty 1

## 2016-12-20 MED ORDER — MORPHINE SULFATE (PF) 4 MG/ML IV SOLN
4.0000 mg | INTRAVENOUS | Status: DC | PRN
  Administered 2016-12-21: 4 mg via INTRAVENOUS
  Filled 2016-12-20: qty 1

## 2016-12-20 MED ORDER — OXYCODONE-ACETAMINOPHEN 5-325 MG PO TABS
1.0000 | ORAL_TABLET | Freq: Once | ORAL | Status: AC
  Administered 2016-12-21: 1 via ORAL
  Filled 2016-12-20: qty 1

## 2016-12-20 NOTE — Anesthesia Postprocedure Evaluation (Signed)
Anesthesia Post Note  Patient: Theona Muhs  Procedure(s) Performed: Procedure(s) (LRB): HYSTERECTOMY TOTAL LAPAROSCOPIC BILATERAL SALPINGECTOMY (Bilateral) CYSTOSCOPY  Patient location during evaluation: PACU Anesthesia Type: General Level of consciousness: awake and alert Pain management: pain level controlled Vital Signs Assessment: post-procedure vital signs reviewed and stable Respiratory status: spontaneous breathing, nonlabored ventilation, respiratory function stable and patient connected to nasal cannula oxygen Cardiovascular status: blood pressure returned to baseline and stable Postop Assessment: no signs of nausea or vomiting Anesthetic complications: no     Last Vitals:  Vitals:   12/19/16 1403 12/19/16 1419  BP: 135/81 (!) 143/85  Pulse: 77 67  Resp: 14 15  Temp: 36.5 C     Last Pain:  Vitals:   12/20/16 0813  TempSrc:   PainSc: 1                  Precious Haws Piscitello

## 2016-12-20 NOTE — ED Provider Notes (Signed)
[facility] Emergency Department Provider Note    First MD Initiated Contact with Patient 12/20/16 2336     (approximate)  I have reviewed the triage vital signs and the nursing notes.   HISTORY  Chief Complaint Post-op Problem    HPI Tammy Garza is a 36 y.o. female presents roughly 36 hours after a transabdominal partial hysterectomy. Patient reportedly tolerated procedure well without any, medication she was discharged home yesterday on oral Percocet but is had worsening abdominal pain she describes as crampy in nature. States that her pain never completely was gone.States she has had some vaginal spotting but no heavy bleeding. No vaginal discharge. She's also had some burning when she urinates. Denies any fevers. Presents to the ER today because she will is not getting any relief from her oral pain medications.  No N/V/D.  Has been passing gas. Has been able to eat and drink. Ran out of pain medications around 5 PM tonight and came to the ER due to persistent discomfort.   Past Medical History:  Diagnosis Date  . Anemia    h/o  . Cervical high risk human papillomavirus (HPV) DNA test positive   . Chronic gastritis   . Depression   . DVT (deep venous thrombosis) ([place]) 09/2016   left leg  . H. pylori infection   . History of attempted suicide   . History of gastric polyp   . History of kidney stones    h/o  . Migraines   . Protein C deficiency ([place])   . Rash   . Shift work sleep disorder    Family History  Problem Relation Age of Onset  . HIV Sister   . Cancer Sister 65       cervical cancer  . Asthma Daughter   . Hypertension Maternal Grandmother   . Hypertension Mother   . Obesity Mother   . Cancer Father 26       lung cancer  . Deep vein thrombosis Father   . COPD Paternal Grandmother   . Deep vein thrombosis Paternal Grandmother    Past Surgical History:  Procedure Laterality Date  . CHOLECYSTECTOMY    . CYSTOSCOPY  12/19/2016     Procedure: CYSTOSCOPY;  Surgeon: Gae Dry, MD;  Location: [facility] ORS;  Service: Gynecology;;  . LAPAROSCOPIC HYSTERECTOMY Bilateral 12/19/2016   Procedure: HYSTERECTOMY TOTAL LAPAROSCOPIC BILATERAL SALPINGECTOMY;  Surgeon: Gae Dry, MD;  Location: [facility] ORS;  Service: Gynecology;  Laterality: Bilateral;  . TUBAL LIGATION     Patient Active Problem List   Diagnosis Date Noted  . Low protein C activity level ([place]) 12/10/2016  . Monoclonal gammopathy of unknown significance (MGUS) 11/30/2016  . Fibroid 11/22/2016  . Menorrhagia with regular cycle 11/02/2016  . Menstrual migraine without status migrainosus, not intractable 11/02/2016  . History of DVT (deep vein thrombosis) 11/02/2016  . Elevated blood protein 09/26/2016  . Acute deep vein thrombosis (DVT) of distal lower extremity ([place]) 09/17/2016  . Vitamin D deficiency 07/26/2015  . Thyroid cyst 07/26/2015  . Pap smear of cervix shows high risk HPV present 04/22/2015  . History of Helicobacter pylori infection 04/22/2015  . Migraine with aura and without status migrainosus 04/22/2015  . Shift work sleep disorder 04/22/2015      Prior to Admission medications   Medication Sig Start Date End Date Taking? Authorizing Provider  ELIQUIS 5 MG TABS tablet TAKE 1 TABLET BY MOUTH TWICE A DAY 12/11/16  Yes Lequita Asal, MD  oxyCODONE-acetaminophen (PERCOCET/ROXICET) 5-325 MG tablet Take 1-2 tablets by mouth every 6 (six) hours as needed. 12/19/16  Yes Gae Dry, MD  rizatriptan (MAXALT) 10 MG tablet Take 10 mg by mouth as needed for migraine.    Yes [provider]  Vitamin D, Ergocalciferol, (DRISDOL) 50000 units CAPS capsule TAKE 1 CAPSULE (50,000 UNITS TOTAL) BY MOUTH EVERY 7 (SEVEN) DAYS. 09/19/16  Yes Sowles, Drue Stager, MD  oxyCODONE-acetaminophen (PERCOCET) 10-325 MG tablet Take 1 tablet by mouth every 6 (six) hours as needed for pain. 12/21/16 12/21/17  Merlyn Lot, MD  polyethylene glycol [facility] /  Floria Raveling) packet Take 17 g by mouth daily. Mix one tablespoon with 8oz of your favorite juice or water every day until you are having soft formed stools. Then start taking once daily if you didn't have a stool the day before. 12/21/16   Merlyn Lot, MD    Allergies Patient has no known allergies.    Social History Social History  Substance Use Topics  . Smoking status: Former Smoker    Packs/day: 0.25    Years: 5.00    Types: Cigarettes    Quit date: 05/15/2012  . Smokeless tobacco: Never Used  . Alcohol use 0.6 oz/week    1 Glasses of wine per week     Comment: occ wine    Review of Systems Patient denies headaches, rhinorrhea, blurry vision, numbness, shortness of breath, chest pain, edema, cough, abdominal pain, nausea, vomiting, diarrhea, dysuria, fevers, rashes or hallucinations unless otherwise stated above in HPI. ____________________________________________   PHYSICAL EXAM:  VITAL SIGNS: Vitals:   12/21/16 0109 12/21/16 0318  BP: 101/63 109/79  Pulse: 60 80  Resp: 18 18  Temp:      Constitutional: Alert and oriented. Well appearing and in no acute distress. Eyes: Conjunctivae are normal.  Head: Atraumatic. Nose: No congestion/rhinnorhea. Mouth/Throat: Mucous membranes are moist.   Neck: No stridor. Painless ROM.  Cardiovascular: Normal rate, regular rhythm. Grossly normal heart sounds.  Good peripheral circulation. Respiratory: Normal respiratory effort.  No retractions. Lungs CTAB. Gastrointestinal: Soft, surgical sites are c/d/i.  No peritonitis, mild suprapubic ttp. No distention. No abdominal bruits. No CVA tenderness. Musculoskeletal: No lower extremity tenderness nor edema.  No joint effusions. Neurologic:  Normal speech and language. No gross focal neurologic deficits are appreciated. No facial droop Skin:  Skin is warm, dry and intact. No rash noted. Psychiatric: Mood and affect are normal. Speech and behavior are  normal.  ____________________________________________   LABS (all labs ordered are listed, but only abnormal results are displayed)  Results for orders placed or performed during the hospital encounter of 12/20/16 (from the past 24 hour(s))  CBC with Differential/Platelet     Status: Abnormal   Collection Time: 12/20/16 11:50 PM  Result Value Ref Range   WBC 12.0 (H) 3.6 - 11.0 K/uL   RBC 3.97 3.80 - 5.20 MIL/uL   Hemoglobin 12.3 12.0 - 16.0 g/dL   HCT 37.1 35.0 - 47.0 %   MCV 93.5 80.0 - 100.0 fL   MCH 31.0 26.0 - 34.0 pg   MCHC 33.2 32.0 - 36.0 g/dL   RDW 13.6 11.5 - 14.5 %   Platelets 239 150 - 440 K/uL   Neutrophils Relative % 67 %   Neutro Abs 8.0 (H) 1.4 - 6.5 K/uL   Lymphocytes Relative 24 %   Lymphs Abs 2.8 1.0 - 3.6 K/uL   Monocytes Relative 8 %   Monocytes Absolute 1.0 (H) 0.2 - 0.9 K/uL  Eosinophils Relative 1 %   Eosinophils Absolute 0.1 0 - 0.7 K/uL   Basophils Relative 0 %   Basophils Absolute 0.0 0 - 0.1 K/uL  Comprehensive metabolic panel     Status: Abnormal   Collection Time: 12/20/16 11:50 PM  Result Value Ref Range   Sodium 141 135 - 145 mmol/L   Potassium 3.7 3.5 - 5.1 mmol/L   Chloride 108 101 - 111 mmol/L   CO2 25 22 - 32 mmol/L   Glucose, Bld 100 (H) 65 - 99 mg/dL   BUN 13 6 - 20 mg/dL   Creatinine, Ser 0.91 0.44 - 1.00 mg/dL   Calcium 8.9 8.9 - 10.3 mg/dL   Total Protein 7.7 6.5 - 8.1 g/dL   Albumin 4.1 3.5 - 5.0 g/dL   AST 26 15 - 41 U/L   ALT 27 14 - 54 U/L   Alkaline Phosphatase 45 38 - 126 U/L   Total Bilirubin 1.1 0.3 - 1.2 mg/dL   GFR calc non Af Amer >60 >60 mL/min   GFR calc Af Amer >60 >60 mL/min   Anion gap 8 5 - 15  Urinalysis, Complete w Microscopic     Status: Abnormal   Collection Time: 12/21/16  1:55 AM  Result Value Ref Range   Color, Urine YELLOW (A) YELLOW   APPearance HAZY (A) CLEAR   Specific Gravity, Urine 1.024 1.005 - 1.030   pH 5.0 5.0 - 8.0   Glucose, UA NEGATIVE NEGATIVE mg/dL   Hgb urine dipstick MODERATE  (A) NEGATIVE   Bilirubin Urine NEGATIVE NEGATIVE   Ketones, ur NEGATIVE NEGATIVE mg/dL   Protein, ur NEGATIVE NEGATIVE mg/dL   Nitrite NEGATIVE NEGATIVE   Leukocytes, UA NEGATIVE NEGATIVE   RBC / HPF 0-5 0 - 5 RBC/hpf   WBC, UA 0-5 0 - 5 WBC/hpf   Bacteria, UA NONE SEEN NONE SEEN   Squamous Epithelial / LPF 0-5 (A) NONE SEEN   Mucous PRESENT    ____________________________________________ ____________________________________________  RADIOLOGY  I personally reviewed all radiographic images ordered to evaluate for the above acute complaints and reviewed radiology reports and findings.  These findings were personally discussed with the patient.  Please see medical record for radiology report.  ____________________________________________   PROCEDURES  Procedure(s) performed:  Procedures    Critical Care performed: no ____________________________________________   INITIAL IMPRESSION / ASSESSMENT AND PLAN / ED COURSE  Pertinent labs & imaging results that were available during my care of the patient were reviewed by me and considered in my medical decision making (see chart for details).  DDX: post op pain, uti, constipation, msdk strain, unlikely stone, appy, abscess  Tammy Garza is a 36 y.o. who presents to the ED with chief complaint of abdominal pain after surgery as described above. She is afebrile and in no acute distress. Does have abdominal tenderness in the suprapubic area but no peritonitis. Otherwise nontender in the upper quadrants. Surgical sites appear clean dry and intact. Clinical be to send of developed an abscess. X-ray ordered to evaluate for any evidence of retained foreign body or SBO shows none. Blood work ordered to evaluate for any significant leukocytosis or acute blood loss anemia is fairly reassuring in the postoperative state.  At this point so close in the postoperative phase and not certain that they're be much utility and CT imaging at this time,  particularly given her clinical presentation.  We'll provide IV medications to evaluate for a symptomatically management. We'll check urinalysis. Will consult OB/GYN.  Clinical Course as of Dec 21 409  Thu Dec 21, 2016  0100 Blood work is fairly reassuring. Spoke with Dr. Glennon Mac of OB/GYN.  Would expect the leukocytosis to be much higher and postop.. She has no acidosis. X-ray shows moderate stool burden but no obstructive pattern or free air.Patient feels improvement in symptoms. We'll trial by mouth PO challenge.  [PR]  [phone] Patient states that the pain has improved. She tolerated a meal tray and is tolerating oral hydration. Pain is controlld on oral pain medication.  Repeat abdominal exam is not peritoneal. Tender but seems appropriate in the postop phase. She is able to eat and drink without any worsening pain or symptoms, do not feel this is clinically consistent more insidious process such as appendicitis or bowel perforation.  Have offered CT imaging of the patient but she is feeling better she has declined this. At this point, I do believe patient is stable for follow-up with  Ob/gyn.  Have discussed with the patient and available family all diagnostics and treatments performed thus far and all questions were answered to the best of my ability. The patient demonstrates understanding and agreement with plan. [PR]    Clinical Course User Index [PR] Merlyn Lot, MD     ____________________________________________   FINAL CLINICAL IMPRESSION(S) / ED DIAGNOSES  Final diagnoses:  Post-operative pain  Lower abdominal pain      NEW MEDICATIONS STARTED DURING THIS VISIT:  New Prescriptions   OXYCODONE-ACETAMINOPHEN (PERCOCET) 10-325 MG TABLET    Take 1 tablet by mouth every 6 (six) hours as needed for pain.   POLYETHYLENE GLYCOL (MIRALAX / GLYCOLAX) PACKET    Take 17 g by mouth daily. Mix one tablespoon with 8oz of your favorite juice or water every day until you are having soft  formed stools. Then start taking once daily if you didn't have a stool the day before.     Note:  This document was prepared using Dragon voice recognition software and may include unintentional dictation errors.    Merlyn Lot, MD 12/21/16 [phone]

## 2016-12-20 NOTE — ED Triage Notes (Signed)
Pt presents to ED via POV with c/o post-op pain s/p partial hysterectomy performed on Tuedday. Pt reports taking r/x'd Oxycodone as directed for pain without relief (last dose around 5pm tonight). Pt denies vaginal bleeding or d/c, reports "occasional" spotting. Pt is A&O, in NAD; RR even, regular, and unlabored.

## 2016-12-21 ENCOUNTER — Emergency Department: Payer: 59

## 2016-12-21 ENCOUNTER — Telehealth: Payer: Self-pay

## 2016-12-21 DIAGNOSIS — R1084 Generalized abdominal pain: Secondary | ICD-10-CM | POA: Diagnosis not present

## 2016-12-21 LAB — CBC WITH DIFFERENTIAL/PLATELET
Basophils Absolute: 0 10*3/uL (ref 0–0.1)
Basophils Relative: 0 %
Eosinophils Absolute: 0.1 10*3/uL (ref 0–0.7)
Eosinophils Relative: 1 %
HCT: 37.1 % (ref 35.0–47.0)
Hemoglobin: 12.3 g/dL (ref 12.0–16.0)
Lymphocytes Relative: 24 %
Lymphs Abs: 2.8 10*3/uL (ref 1.0–3.6)
MCH: 31 pg (ref 26.0–34.0)
MCHC: 33.2 g/dL (ref 32.0–36.0)
MCV: 93.5 fL (ref 80.0–100.0)
Monocytes Absolute: 1 10*3/uL — ABNORMAL HIGH (ref 0.2–0.9)
Monocytes Relative: 8 %
Neutro Abs: 8 10*3/uL — ABNORMAL HIGH (ref 1.4–6.5)
Neutrophils Relative %: 67 %
Platelets: 239 10*3/uL (ref 150–440)
RBC: 3.97 MIL/uL (ref 3.80–5.20)
RDW: 13.6 % (ref 11.5–14.5)
WBC: 12 10*3/uL — ABNORMAL HIGH (ref 3.6–11.0)

## 2016-12-21 LAB — COMPREHENSIVE METABOLIC PANEL
ALT: 27 U/L (ref 14–54)
AST: 26 U/L (ref 15–41)
Albumin: 4.1 g/dL (ref 3.5–5.0)
Alkaline Phosphatase: 45 U/L (ref 38–126)
Anion gap: 8 (ref 5–15)
BUN: 13 mg/dL (ref 6–20)
CO2: 25 mmol/L (ref 22–32)
Calcium: 8.9 mg/dL (ref 8.9–10.3)
Chloride: 108 mmol/L (ref 101–111)
Creatinine, Ser: 0.91 mg/dL (ref 0.44–1.00)
GFR calc Af Amer: >60 mL/min (ref >60)
GFR calc non Af Amer: >60 mL/min (ref >60)
Glucose, Bld: 100 mg/dL — ABNORMAL HIGH (ref 65–99)
Potassium: 3.7 mmol/L (ref 3.5–5.1)
Sodium: 141 mmol/L (ref 135–145)
Total Bilirubin: 1.1 mg/dL (ref 0.3–1.2)
Total Protein: 7.7 g/dL (ref 6.5–8.1)

## 2016-12-21 LAB — URINALYSIS, COMPLETE (UACMP) WITH MICROSCOPIC
Bacteria, UA: NONE SEEN
Bilirubin Urine: NEGATIVE
Glucose, UA: NEGATIVE mg/dL
Ketones, ur: NEGATIVE mg/dL
Leukocytes, UA: NEGATIVE
Nitrite: NEGATIVE
Protein, ur: NEGATIVE mg/dL
Specific Gravity, Urine: 1.024 (ref 1.005–1.030)
pH: 5 (ref 5.0–8.0)

## 2016-12-21 LAB — SURGICAL PATHOLOGY

## 2016-12-21 MED ORDER — POLYETHYLENE GLYCOL 3350 17 G PO PACK
17.0000 g | PACK | Freq: Every day | ORAL | Status: DC
  Administered 2016-12-21: 17 g via ORAL

## 2016-12-21 MED ORDER — KETOROLAC TROMETHAMINE 30 MG/ML IJ SOLN
15.0000 mg | Freq: Once | INTRAMUSCULAR | Status: AC
  Administered 2016-12-21: 15 mg via INTRAVENOUS
  Filled 2016-12-21: qty 1

## 2016-12-21 MED ORDER — OXYCODONE HCL 5 MG PO TABS
10.0000 mg | ORAL_TABLET | Freq: Once | ORAL | Status: AC
  Administered 2016-12-21: 10 mg via ORAL
  Filled 2016-12-21: qty 2

## 2016-12-21 MED ORDER — OXYCODONE-ACETAMINOPHEN 10-325 MG PO TABS: 1.0000 | ORAL_TABLET | Freq: Four times a day (QID) | ORAL | 0 refills | Status: DC | PRN

## 2016-12-21 MED ORDER — SODIUM CHLORIDE 0.9 % IV BOLUS (SEPSIS)
1000.0000 mL | Freq: Once | INTRAVENOUS | Status: AC
  Administered 2016-12-21: 1000 mL via INTRAVENOUS

## 2016-12-21 MED ORDER — POLYETHYLENE GLYCOL 3350 17 G PO PACK: 17.0000 g | PACK | Freq: Every day | ORAL | 0 refills | Status: DC

## 2016-12-21 MED ORDER — POLYETHYLENE GLYCOL 3350 17 G PO PACK
PACK | ORAL | Status: AC
  Administered 2016-12-21: 17 g via ORAL
  Filled 2016-12-21: qty 1

## 2016-12-21 NOTE — Telephone Encounter (Signed)
Pt states she is feeling much better since hospital visit last night and pain is more manageable. Pt was given morphine and percocet in the hospital and currently rates her pain as a 3 out of 10. Pt advised to contact us prior to post op appt on 01/04/17 if sxs worsen or persist.

## 2016-12-21 NOTE — Telephone Encounter (Signed)
-----   Message from Gae Dry, MD sent at 12/21/2016  7:11 AM EDT ----- Regarding: post op Please call and check on patient this am after her surgery Tuesday and also she came to ER last night for pain and had a work up that did not reveal any concerning findings... See how she is and let me know.  Appt if still highly concerned about pain.

## 2016-12-21 NOTE — Telephone Encounter (Signed)
Thx

## 2016-12-21 NOTE — ED Notes (Signed)
Patient transported to X-ray at this time 

## 2016-12-21 NOTE — Discharge Instructions (Signed)

## 2016-12-21 NOTE — Telephone Encounter (Signed)
Left msg for pt to call back regarding pain.

## 2016-12-21 NOTE — ED Notes (Signed)
Pt able to eat an entire meal tray and 12 oz apple juice without difficulty; no c/o N/V reported.

## 2016-12-25 ENCOUNTER — Telehealth: Payer: Self-pay

## 2016-12-25 NOTE — Telephone Encounter (Signed)
FMLA/DISABILITY form for pt's work (2) filled out and given to [place] for processing.

## 2016-12-27 ENCOUNTER — Encounter: Payer: Self-pay | Admitting: Obstetrics & Gynecology

## 2016-12-28 ENCOUNTER — Encounter: Payer: Self-pay | Admitting: Obstetrics & Gynecology

## 2016-12-28 ENCOUNTER — Ambulatory Visit: Payer: 59 | Admitting: Hematology and Oncology

## 2016-12-28 ENCOUNTER — Other Ambulatory Visit: Payer: 59

## 2016-12-28 NOTE — Telephone Encounter (Signed)
Plz print and fax letter I did today Thx

## 2017-01-01 ENCOUNTER — Encounter: Payer: Self-pay | Admitting: Hematology and Oncology

## 2017-01-02 ENCOUNTER — Telehealth: Payer: Self-pay | Admitting: *Deleted

## 2017-01-02 ENCOUNTER — Ambulatory Visit
Admission: RE | Admit: 2017-01-02 | Discharge: 2017-01-02 | Disposition: A | Discharge status: Home / Self Care | Payer: 59 | Source: Ambulatory Visit | Attending: Oncology | Admitting: Oncology

## 2017-01-02 ENCOUNTER — Inpatient Hospital Stay: Payer: 59 | Attending: Oncology | Admitting: Oncology

## 2017-01-02 VITALS — BP 134/85 | HR 92 | Temp 99.0°F | Resp 18 | Wt 173.2 lb

## 2017-01-02 DIAGNOSIS — A630 Anogenital (venereal) warts: Secondary | ICD-10-CM | POA: Insufficient documentation

## 2017-01-02 DIAGNOSIS — Z915 Personal history of self-harm: Secondary | ICD-10-CM | POA: Insufficient documentation

## 2017-01-02 DIAGNOSIS — D6859 Other primary thrombophilia: Secondary | ICD-10-CM

## 2017-01-02 DIAGNOSIS — Z7901 Long term (current) use of anticoagulants: Secondary | ICD-10-CM | POA: Diagnosis not present

## 2017-01-02 DIAGNOSIS — D472 Monoclonal gammopathy: Secondary | ICD-10-CM

## 2017-01-02 DIAGNOSIS — I824Z9 Acute embolism and thrombosis of unspecified deep veins of unspecified distal lower extremity: Secondary | ICD-10-CM

## 2017-01-02 DIAGNOSIS — Z86718 Personal history of other venous thrombosis and embolism: Secondary | ICD-10-CM | POA: Diagnosis not present

## 2017-01-02 DIAGNOSIS — M7989 Other specified soft tissue disorders: Secondary | ICD-10-CM | POA: Insufficient documentation

## 2017-01-02 DIAGNOSIS — Z87891 Personal history of nicotine dependence: Secondary | ICD-10-CM | POA: Diagnosis not present

## 2017-01-02 DIAGNOSIS — Z8249 Family history of ischemic heart disease and other diseases of the circulatory system: Secondary | ICD-10-CM | POA: Diagnosis not present

## 2017-01-02 DIAGNOSIS — Z87442 Personal history of urinary calculi: Secondary | ICD-10-CM | POA: Diagnosis not present

## 2017-01-02 DIAGNOSIS — Z79899 Other long term (current) drug therapy: Secondary | ICD-10-CM | POA: Insufficient documentation

## 2017-01-02 NOTE — Progress Notes (Signed)
Patient has history of DVT.  Presents today with left arm swelling for the past 3 days and states she can feel "knots" in her arm.  Also states periodically she gets SOB and has some right sided chest pain.  Temp 99

## 2017-01-02 NOTE — Telephone Encounter (Signed)
Called patient in reference to her my chart message and asked her to call the clinic back for an appointment.

## 2017-01-02 NOTE — Progress Notes (Signed)
Symptom Management Consult note [facility]  Telephone:([phone] [phone]  Patient Care Team: [name], MD as PCP - General (Family Medicine)   Name of the patient: Tammy Garza  [id number]  1981/01/19   Date of visit: 01/02/17  Diagnosis-  Protein C deficiency/ History of DVT  Chief complaint/ Reason for visit- Swelling of left lower arm.   Heme/Onc history: 36 y.o. female with a left lower extremity DVT following a 2 1/2 hour flight.  She has been on DepoProvera x 1 year.  She has a family history of thrombosis.  Left lower extremity duplex on 09/17/2016 revealed an acute appearing thrombus in the calf veins of the left lower extremity (posterior tibial and peroneal veins).  Bilateral lower extremity duplex on 11/20/2016 revealed no evidence of acute DVT within either lower extremity. There was some residual nonocclusive thrombus in the left perineal vein, sequela of previous DVT on the prior scan.  She is on Eliquis.    Hypercoagulable work-up on 09/26/2016 and 11/30/2016 revealed the following negative studies:  Factor V Leiden, prothrombin gene mutation, lupus anticoagulant panel, anticardiolipin antibodies, beta2-glycoprotein, protein S antigen/activity, and ATIII antigen/activity.  She may have a protein C deficiency.  Labs on 11/30/2016 revealed a protein C total of 40% (60 - 150%) and protein C activity of 49% (73-180%)  She has a monoclonal gammopathy of unknown significance.  SPEP on 09/26/2016 was negative.  Immunofixation revealed an IgA monoclonal protein with lambda light chain specificity.  Free light chain assay was normal.  IgG was 698 [phone]).  IgA was 182 (87-352).  24 hour UPEP on 10/22/2016 revealed no monoclonal protein.    Chest, abdomen, and pelvic CT on 12/08/2016 revealed no lymphadenopathy or hepatosplenomegaly.  Interval history- Patient presents today with left lower arm swelling that began approximately  3 days ago. She describes it as a pulsating pain. It is sore to the touch and uncomfortable if she rolls over on it. She also notes several "lumps" located along her lower arm that are tender to the touch. She has not taken anything for the pain. Approximately 2 weeks ago she had a hysterectomy. She came off of her Eliquis for 3 days for surgery. She restarted the day after surgery.  Other than missing those doses she denies missing additional doses. She denies injury to that arm. She does admit to having an IV in the left antecubital space but did not notice any pain or swelling after removal. She denies shortness of breath and chest pain. She denies any recent fevers or illnesses. She denies easy bruising or bleeding. She denies nausea vomiting constipation or diarrhea.  ECOG FS:0 - Asymptomatic  Review of systems- Review of Systems  Constitutional: Positive for fever. Negative for malaise/fatigue and weight loss.  HENT: Negative.   Eyes: Negative.   Respiratory: Negative for shortness of breath and wheezing.   Cardiovascular: Negative for chest pain, claudication and leg swelling.  Gastrointestinal: Negative for abdominal pain, diarrhea, nausea and vomiting.  Genitourinary: Negative.   Musculoskeletal: Negative.   Skin: Negative.   Neurological: Negative for dizziness, weakness and headaches.  Endo/Heme/Allergies: Negative.   Psychiatric/Behavioral: Negative.      Current treatment- Eliquis 5 mg BID  No Known Allergies   Past Medical History:  Diagnosis Date  . Anemia    h/o  . Cervical high risk human papillomavirus (HPV) DNA test positive   . Chronic gastritis   . Depression   . DVT (deep venous  thrombosis) ([place]) 09/2016   left leg  . H. pylori infection   . History of attempted suicide   . History of gastric polyp   . History of kidney stones    h/o  . Migraines   . Protein C deficiency ([place])   . Rash   . Shift work sleep disorder      Past Surgical History:    Procedure Laterality Date  . CHOLECYSTECTOMY    . CYSTOSCOPY  12/19/2016   Procedure: CYSTOSCOPY;  Surgeon: Gae Dry, MD;  Location: [facility] ORS;  Service: Gynecology;;  . LAPAROSCOPIC HYSTERECTOMY Bilateral 12/19/2016   Procedure: HYSTERECTOMY TOTAL LAPAROSCOPIC BILATERAL SALPINGECTOMY;  Surgeon: Gae Dry, MD;  Location: [facility] ORS;  Service: Gynecology;  Laterality: Bilateral;  . TUBAL LIGATION      Social History   Social History  . Marital status: Married    Spouse name: N/A  . Number of children: N/A  . Years of education: N/A   Occupational History  . Not on file.   Social History Main Topics  . Smoking status: Former Smoker    Packs/day: 0.25    Years: 5.00    Types: Cigarettes    Quit date: 05/15/2012  . Smokeless tobacco: Never Used  . Alcohol use 0.6 oz/week    1 Glasses of wine per week     Comment: occ wine  . Drug use: No  . Sexual activity: Yes    Partners: Male    Birth control/ protection: Surgical, Injection     Comment: Tubal Ligation   Other Topics Concern  . Not on file   Social History Narrative   Separated since June 2016 - she has two children at home . The youngest is his daughter. Working at [employer] in [place]. She works at [employer]. She had been laid off by back since 2017.    Dating Gwyndolyn Saxon since 2015    Family History  Problem Relation Age of Onset  . HIV Sister   . Cancer Sister 49       cervical cancer  . Asthma Daughter   . Hypertension Maternal Grandmother   . Hypertension Mother   . Obesity Mother   . Cancer Father 54       lung cancer  . Deep vein thrombosis Father   . COPD Paternal Grandmother   . Deep vein thrombosis Paternal Grandmother      Current Outpatient Prescriptions:  .  ELIQUIS 5 MG TABS tablet, TAKE 1 TABLET BY MOUTH TWICE A DAY, Disp: 60 tablet, Rfl: 1 .  oxyCODONE-acetaminophen (PERCOCET/ROXICET) 5-325 MG tablet, Take 1-2 tablets by mouth every 6 (six) hours as needed., Disp: 42 tablet,  Rfl: 0 .  polyethylene glycol (MIRALAX / GLYCOLAX) packet, Take 17 g by mouth daily. Mix one tablespoon with 8oz of your favorite juice or water every day until you are having soft formed stools. Then start taking once daily if you didn't have a stool the day before., Disp: 30 each, Rfl: 0 .  rizatriptan (MAXALT) 10 MG tablet, Take 10 mg by mouth as needed for migraine. , Disp: , Rfl:  .  Vitamin D, Ergocalciferol, (DRISDOL) 50000 units CAPS capsule, TAKE 1 CAPSULE (50,000 UNITS TOTAL) BY MOUTH EVERY 7 (SEVEN) DAYS., Disp: 12 capsule, Rfl: 0  Physical exam:  Vitals:   01/02/17 1445  BP: 134/85  Pulse: 92  Resp: 18  Temp: 99 F (37.2 C)  TempSrc: Tympanic  Weight: 173 lb 4 oz (78.6 kg)  Physical Exam  Constitutional: She is oriented to person, place, and time and well-developed, well-nourished, and in no distress. Vital signs are normal.  HENT:  Head: Normocephalic and atraumatic.  Neck: Normal range of motion and full passive range of motion without pain. Neck supple.  Cardiovascular: Normal rate and regular rhythm.   Pulmonary/Chest: Effort normal and breath sounds normal.  Abdominal: Soft. Normal appearance.  Musculoskeletal:       Left elbow: She exhibits swelling. Tenderness found.       Left wrist: She exhibits tenderness and swelling.       Arms: Neurological: She is alert and oriented to person, place, and time.  Skin: Skin is warm, dry and intact.  Psychiatric: Mood, memory, affect and judgment normal.     CMP Latest Ref Rng & Units 12/20/2016  Glucose 65 - 99 mg/dL 100(H)  BUN 6 - 20 mg/dL 13  Creatinine 0.44 - 1.00 mg/dL 0.91  Sodium 135 - 145 mmol/L 141  Potassium 3.5 - 5.1 mmol/L 3.7  Chloride 101 - 111 mmol/L 108  CO2 22 - 32 mmol/L 25  Calcium 8.9 - 10.3 mg/dL 8.9  Total Protein 6.5 - 8.1 g/dL 7.7  Total Bilirubin 0.3 - 1.2 mg/dL 1.1  Alkaline Phos 38 - 126 U/L 45  AST 15 - 41 U/L 26  ALT 14 - 54 U/L 27   CBC Latest Ref Rng & Units 12/20/2016  WBC 3.6 -  11.0 K/uL 12.0(H)  Hemoglobin 12.0 - 16.0 g/dL 12.3  Hematocrit 35.0 - 47.0 % 37.1  Platelets 150 - 440 K/uL 239    No images are attached to the encounter.  Dg Abdomen 1 View  Result Date: 12/21/2016 CLINICAL DATA:  Acute onset of generalized abdominal pain, status post recent partial hysterectomy. Initial encounter. EXAM: ABDOMEN - 1 VIEW COMPARISON:  CT of the abdomen and pelvis performed 12/08/2016 FINDINGS: The visualized bowel gas pattern is unremarkable. Scattered air and stool filled loops of colon are seen; no abnormal dilatation of small bowel loops is seen to suggest small bowel obstruction. No free intra-abdominal air is identified, though evaluation for free air is limited on a single supine view. Clips are noted within the right upper quadrant, reflecting prior cholecystectomy. The visualized osseous structures are within normal limits; the sacroiliac joints are unremarkable in appearance. IMPRESSION: Unremarkable bowel gas pattern; no free intra-abdominal air seen. Small to moderate amount of stool noted in the colon. Electronically Signed   By: Garald Balding M.D.   On: 12/21/2016 00:29   Ct Chest W Contrast  Result Date: 12/08/2016 CLINICAL DATA:  Abdominal cramping, diarrhea, and recent left leg DVT. Monoclonal mammography on serum protein electrophoresis . Night sweats and weight loss. EXAM: CT CHEST, ABDOMEN, AND PELVIS WITH CONTRAST TECHNIQUE: Multidetector CT imaging of the chest, abdomen and pelvis was performed following the standard protocol during bolus administration of intravenous contrast. CONTRAST:  168mL ISOVUE-300 IOPAMIDOL (ISOVUE-300) INJECTION 61% COMPARISON:  None. FINDINGS: CT CHEST FINDINGS Cardiovascular: No acute findings. Mediastinum/Lymph Nodes: No masses or pathologically enlarged lymph nodes identified. Lungs/Pleura: No pulmonary infiltrate or mass identified. No effusion present. Musculoskeletal:  No suspicious bone lesions identified. CT ABDOMEN AND PELVIS  FINDINGS Hepatobiliary: No masses identified. Prior cholecystectomy. No evidence of biliary dilatation. Pancreas:  No mass or inflammatory changes. Spleen:  Within normal limits in size and appearance. Adrenals/Urinary tract:  No masses or hydronephrosis. Stomach/Bowel: No evidence of obstruction, inflammatory process, or abnormal fluid collections. Vascular/Lymphatic: No pathologically enlarged lymph nodes identified. No  abdominal aortic aneurysm. Reproductive: A 3 cm subserosal fibroid is seen right posterior uterine fundus. Adnexal regions are unremarkable in appearance. Other:  None. Musculoskeletal:  No suspicious bone lesions identified. IMPRESSION: No lymphadenopathy or other acute findings within the abdomen or pelvis. 3 cm uterine fibroid. Electronically Signed   By: Earle Gell M.D.   On: 12/08/2016 14:53   Ct Abdomen Pelvis W Contrast  Result Date: 12/08/2016 CLINICAL DATA:  Abdominal cramping, diarrhea, and recent left leg DVT. Monoclonal mammography on serum protein electrophoresis . Night sweats and weight loss. EXAM: CT CHEST, ABDOMEN, AND PELVIS WITH CONTRAST TECHNIQUE: Multidetector CT imaging of the chest, abdomen and pelvis was performed following the standard protocol during bolus administration of intravenous contrast. CONTRAST:  191mL ISOVUE-300 IOPAMIDOL (ISOVUE-300) INJECTION 61% COMPARISON:  None. FINDINGS: CT CHEST FINDINGS Cardiovascular: No acute findings. Mediastinum/Lymph Nodes: No masses or pathologically enlarged lymph nodes identified. Lungs/Pleura: No pulmonary infiltrate or mass identified. No effusion present. Musculoskeletal:  No suspicious bone lesions identified. CT ABDOMEN AND PELVIS FINDINGS Hepatobiliary: No masses identified. Prior cholecystectomy. No evidence of biliary dilatation. Pancreas:  No mass or inflammatory changes. Spleen:  Within normal limits in size and appearance. Adrenals/Urinary tract:  No masses or hydronephrosis. Stomach/Bowel: No evidence of  obstruction, inflammatory process, or abnormal fluid collections. Vascular/Lymphatic: No pathologically enlarged lymph nodes identified. No abdominal aortic aneurysm. Reproductive: A 3 cm subserosal fibroid is seen right posterior uterine fundus. Adnexal regions are unremarkable in appearance. Other:  None. Musculoskeletal:  No suspicious bone lesions identified. IMPRESSION: No lymphadenopathy or other acute findings within the abdomen or pelvis. 3 cm uterine fibroid. Electronically Signed   By: Earle Gell M.D.   On: 12/08/2016 14:53   US Venous Img Upper Uni Left  Result Date: 01/02/2017 CLINICAL DATA:  Soft tissue swelling and pain EXAM: LEFT UPPER EXTREMITY VENOUS DUPLEX ULTRASOUND TECHNIQUE: Gray-scale sonography with graded compression, as well as color Doppler and duplex ultrasound were performed to evaluate the left upper extremity deep venous system from the level of the subclavian vein and including the jugular, axillary, basilic, radial, ulnar and upper cephalic vein. Spectral Doppler was utilized to evaluate flow at rest and with distal augmentation maneuvers. COMPARISON:  None. FINDINGS: Contralateral Subclavian Vein: Respiratory phasicity is normal and symmetric with the symptomatic side. No evidence of thrombus. Normal compressibility. Internal Jugular Vein: No evidence of thrombus. Normal compressibility, respiratory phasicity and response to augmentation. Subclavian Vein: No evidence of thrombus. Normal compressibility, respiratory phasicity and response to augmentation. Axillary Vein: No evidence of thrombus. Normal compressibility, respiratory phasicity and response to augmentation. Cephalic Vein: No evidence of thrombus. Normal compressibility, respiratory phasicity and response to augmentation. Basilic Vein: No evidence of thrombus. Normal compressibility, respiratory phasicity and response to augmentation. Brachial Veins: No evidence of thrombus. Normal compressibility, respiratory  phasicity and response to augmentation. Radial Veins: No evidence of thrombus. Normal compressibility, respiratory phasicity and response to augmentation. Ulnar Veins: No evidence of thrombus. Normal compressibility, respiratory phasicity and response to augmentation. Venous Reflux:  None visualized. Other Findings: There is soft tissue swelling in the lateral left forearm. Within this area, there is a small thrombosed superficial peripheral vein. IMPRESSION: No evidence of deep venous thrombosis in the left upper extremity. Right subclavian vein patent. Note that there is soft tissue swelling in the lateral forearm. There is a small superficial vein in this area which does show evidence of age uncertain thrombus. Electronically Signed   By: Lowella Grip III M.D.   On: 01/02/2017 17:37  Assessment and plan- Patient is a 36 y.o. female who presents for left forearm swelling that began approximately 3 days ago. It is hot to the touch and visibly swollen. Several small nodules noted upon palpation of left lateral and anterior forearm. They were tender to the touch. She has a temperature of 99.0. Lungs are clear bilaterally.   1. Swelling and pain of left lower arm: US Venous Doppler of LUE STAT. We will call with results. As far as for the pain, suggested hot compresses to the arm. Also suggested ibuprofen/tylenol OTC as needed. Results: No evidence of deep venous thrombosis in the left upper extremity. Right subclavian vein patent. Note that there is soft tissue swelling in the lateral forearm. There is a small superficial vein in this area which does show evidence of age uncertain thrombus.  2. Continue Eliquis 5 MG BID.    Visit Diagnosis 1. Left arm swelling   2. Acute deep vein thrombosis (DVT) of distal lower extremity (HCC)   3. Low protein C activity level ([place])     Patient expressed understanding and was in agreement with this plan. She also understands that She can call clinic at any  time with any questions, concerns, or complaints.    Marisue Humble [facility] Pager- [phone] 01/03/2017 1:14 PM

## 2017-01-04 ENCOUNTER — Encounter: Payer: Self-pay | Admitting: Obstetrics & Gynecology

## 2017-01-04 ENCOUNTER — Ambulatory Visit (INDEPENDENT_AMBULATORY_CARE_PROVIDER_SITE_OTHER): Payer: 59 | Admitting: Obstetrics & Gynecology

## 2017-01-04 VITALS — BP 100/60 | HR 82 | Ht 66.0 in | Wt 174.0 lb

## 2017-01-04 DIAGNOSIS — Z86718 Personal history of other venous thrombosis and embolism: Secondary | ICD-10-CM

## 2017-01-04 DIAGNOSIS — N920 Excessive and frequent menstruation with regular cycle: Secondary | ICD-10-CM

## 2017-01-04 NOTE — Progress Notes (Signed)
  Postoperative Follow-up Patient presents post op from laparoscopic assisted vaginal hysterectomy for abnormal uterine bleeding and fibroids, 2 weeks ago.  Subjective: Patient reports marked improvement in her preop symptoms. Eating a regular diet without difficulty. The patient is not having any pain.  Activity: normal activities of daily living. Patient reports vaginal sx's of Irregular bleeding  Objective: BP 100/60   Pulse 82   Ht 5\' 6"  (1.676 m)   Wt 174 lb (78.9 kg)   LMP 01/04/2016   BMI 28.08 kg/m  Physical Exam  Constitutional: She is oriented to person, place, and time. She appears well-developed and well-nourished. No distress.  Cardiovascular: Normal rate.   Pulmonary/Chest: Effort normal.  Abdominal: Soft. She exhibits no distension. There is no tenderness.  Incision Healing Well   Musculoskeletal: Normal range of motion.  Neurological: She is alert and oriented to person, place, and time. No cranial nerve deficit.  Skin: Skin is warm and dry.  Psychiatric: She has a normal mood and affect.    Assessment: s/p :  total laparoscopic hysterectomy with bilateral salpingectomy stable  Plan: Patient has done well after surgery with no apparent complications.  I have discussed the post-operative course to date, and the expected progress moving forward.  The patient understands what complications to be concerned about.  I will see the patient in routine follow up, or sooner if needed.    Activity plan: May return to work in 14 days Pelvic Rest  Hoyt Koch 01/04/2017, 9:43 AM

## 2017-01-04 NOTE — Patient Instructions (Signed)
F/u 4 weeks, gradually resume exercise and activities

## 2017-01-05 DIAGNOSIS — G43719 Chronic migraine without aura, intractable, without status migrainosus: Secondary | ICD-10-CM | POA: Diagnosis not present

## 2017-01-05 DIAGNOSIS — G43019 Migraine without aura, intractable, without status migrainosus: Secondary | ICD-10-CM | POA: Diagnosis not present

## 2017-01-06 ENCOUNTER — Other Ambulatory Visit: Payer: Self-pay | Admitting: Family Medicine

## 2017-01-06 DIAGNOSIS — E559 Vitamin D deficiency, unspecified: Secondary | ICD-10-CM

## 2017-01-08 ENCOUNTER — Inpatient Hospital Stay: Payer: 59

## 2017-01-08 ENCOUNTER — Inpatient Hospital Stay (HOSPITAL_BASED_OUTPATIENT_CLINIC_OR_DEPARTMENT_OTHER): Payer: 59 | Admitting: Hematology and Oncology

## 2017-01-08 VITALS — BP 122/79 | HR 76 | Temp 97.8°F | Resp 18 | Wt 178.2 lb

## 2017-01-08 DIAGNOSIS — Z86718 Personal history of other venous thrombosis and embolism: Secondary | ICD-10-CM | POA: Diagnosis not present

## 2017-01-08 DIAGNOSIS — Z79899 Other long term (current) drug therapy: Secondary | ICD-10-CM

## 2017-01-08 DIAGNOSIS — Z7901 Long term (current) use of anticoagulants: Secondary | ICD-10-CM | POA: Diagnosis not present

## 2017-01-08 DIAGNOSIS — I824Z9 Acute embolism and thrombosis of unspecified deep veins of unspecified distal lower extremity: Secondary | ICD-10-CM

## 2017-01-08 DIAGNOSIS — M7989 Other specified soft tissue disorders: Secondary | ICD-10-CM | POA: Diagnosis not present

## 2017-01-08 DIAGNOSIS — D472 Monoclonal gammopathy: Secondary | ICD-10-CM | POA: Diagnosis not present

## 2017-01-08 DIAGNOSIS — D6859 Other primary thrombophilia: Secondary | ICD-10-CM

## 2017-01-08 NOTE — Progress Notes (Signed)
[facility]-  Cancer Center  Clinic day:  01/08/2017   Chief Complaint: Tammy Garza is a 36 y.o. female with a DVT who is seen for 1 month assessment after interval partial hysterectomy.  HPI:  The patient was last seen by me in the hematology clinic on 12/11/2016.  At that time, scans revealed no evidence of malignancy.  She has an MGUS.  Labs were suggestive of a protein C deficiency.  We discussed holding her Eliquis for 2 days prior to her planned partial hysterectomy.  She underwent total laparoscopic hysterectomy with bilateral salpingectomy on 12/19/2016 by Dr Paul Harris. 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  She tolerated the procedure well.  Eliquis restarted the day after surgery.  She was seen by Jennifer Burns, NP on 01/02/2017 with a 3 day history of left arm swelling.  She had a history of left antecubital IV with her surgery.  Left upper extremity duplex on 01/02/2017 revealed no evidence of deep venous thrombosis in the left upper extremity.  Right subclavian vein was patent. There was soft tissue swelling in the lateral forearm. There was a small superficial vein in this area which did show evidence of age uncertain thrombus.  Warm compresses, Tylenol, and ibuprofen were suggested.  During the interim, she has done well overall. Patient presents to the clinic with a migraine headache today; on abortive medications at home. Patient reports vaginal bleeding after her surgery. She has been seen by Dr. Harris in follow-up consult.  She is doing well with no complications. She reports continued mild pain, swelling, and itching in her LEFT arm.  She notes "3 lumps in my arm have gone down to 1".  She continues to use compresses.  She is back on her Eliquis.    Past Medical History:  Diagnosis Date  . Anemia    h/o  . Cervical high risk human papillomavirus (HPV) DNA test positive   . Chronic gastritis   . Depression   . DVT (deep venous  thrombosis) (HCC) 09/2016   left leg  . H. pylori infection   . History of attempted suicide   . History of gastric polyp   . History of kidney stones    h/o  . Migraines   . Protein C deficiency (HCC)   . Rash   . Shift work sleep disorder     Past Surgical History:  Procedure Laterality Date  . CHOLECYSTECTOMY    . CYSTOSCOPY  12/19/2016   Procedure: CYSTOSCOPY;  Surgeon: Harris, Robert P, MD;  Location: [facility] ORS;  Service: Gynecology;;  . LAPAROSCOPIC HYSTERECTOMY Bilateral 12/19/2016   Procedure: HYSTERECTOMY TOTAL LAPAROSCOPIC BILATERAL SALPINGECTOMY;  Surgeon: Harris, Robert P, MD;  Location: [facility] ORS;  Service: Gynecology;  Laterality: Bilateral;  . TUBAL LIGATION      Family History  Problem Relation Age of Onset  . HIV Sister   . Cancer Sister 46       cervical cancer  . Asthma Daughter   . Hypertension Maternal Grandmother   . Hypertension Mother   . Obesity Mother   . Cancer Father 56       lung cancer  . Deep vein thrombosis Father   . COPD Paternal Grandmother   . Deep vein thrombosis Paternal Grandmother     Social History:  reports that she quit smoking about 4 years ago. Her smoking use included Cigarettes. She has a 1.25 pack-year smoking history. She has never used smokeless tobacco. She reports that she   drinks about 0.6 oz of alcohol per week . She reports that she does not use drugs.  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  She wears a mask at work.  She has 2 daughters (ages 26 and 35).  The patient is alone today.  Allergies: No Known Allergies  Current Medications: Current Outpatient Prescriptions  Medication Sig Dispense Refill  . celecoxib (CELEBREX) 100 MG capsule Take 100 mg by mouth as needed.  1  . ELIQUIS 5 MG TABS tablet TAKE 1 TABLET BY MOUTH TWICE A DAY 60 tablet 1  . polyethylene glycol (MIRALAX / GLYCOLAX) packet Take 17 g by mouth daily. Mix one tablespoon with 8oz of your favorite juice or water every  day until you are having soft formed stools. Then start taking once daily if you didn't have a stool the day before. 30 each 0  . Vitamin D, Ergocalciferol, (DRISDOL) 50000 units CAPS capsule TAKE 1 CAPSULE (50,000 UNITS TOTAL) BY MOUTH EVERY 7 (SEVEN) DAYS. 12 capsule 0  . oxyCODONE-acetaminophen (PERCOCET/ROXICET) 5-325 MG tablet Take 1-2 tablets by mouth every 6 (six) hours as needed. (Patient not taking: Reported on 01/08/2017) 42 tablet 0   No current facility-administered medications for this visit.     Review of Systems:  GENERAL:  Feels "ok".  Sweats.  No fevers.  Weight is up 4 pounds.  PERFORMANCE STATUS (ECOG):  1 HEENT:  No visual changes, runny nose, sore throat, mouth sores or tenderness. Lungs: No shortness of breath or cough.  No hemoptysis. Cardiac:  No chest pain, palpitations, orthopnea, or PND. GI:  Transient left sided abdominal pain.  No nausea, vomiting, constipation, melena or hematochezia. GU:  No urgency, frequency, dysuria, or hematuria.  Planned partial hysterectomy. Musculoskeletal:  No back pain.  No joint pain.  No muscle tenderness. Extremities:  No pain or swelling. Skin:  No rashes or skin changes. Neuro:  Migraine headaches.  No numbness or weakness, balance or coordination issues. Endocrine:  No diabetes, thyroid issues, hot flashes or night sweats. Psych:  No mood changes, depression or anxiety. Pain:  No focal pain. Review of systems:  All other systems reviewed and found to be negative.  Physical Exam: Blood pressure 122/79, pulse 76, temperature 97.8 F (36.6 C), temperature source Tympanic, resp. rate 18, weight 178 lb 3 oz (80.8 kg), last menstrual period 01/04/2016. GENERAL:  Well developed, well nourished, woman sitting comfortably in the exam room in no acute distress. MENTAL STATUS:  Alert and oriented to person, place and time. HEAD:  Brown hair.  Normocephalic, atraumatic, face symmetric, no Cushingoid features. EYES:  Brown eyes.  Pupils  equal round and reactive to light and accomodation.  No conjunctivitis or scleral icterus. ENT:  Oropharynx clear without lesion.  Tongue normal. Mucous membranes moist.  RESPIRATORY:  Clear to auscultation without rales, wheezes or rhonchi. CARDIOVASCULAR:  Regular rate and rhythm without murmur, rub or gallop. ABDOMEN:  Soft, non-tender, with active bowel sounds, and no hepatosplenomegaly.  No masses. SKIN:  No rashes, ulcers or lesions. EXTREMITIES:  No palpable cords in left forearm.  No edema in left arm.  No edema, no skin discoloration or tenderness.  No palpable cords. LYMPH NODES: No palpable cervical, supraclavicular, axillary or inguinal adenopathy  NEUROLOGICAL: Unremarkable. PSYCH:  Appropriate.   No visits with results within 3 Day(s) from this visit.  Latest known visit with results is:  Admission on 12/20/2016, Discharged on 12/21/2016  Component Date Value Ref  Range Status  . WBC 12/20/2016 12.0* 3.6 - 11.0 K/uL Final  . RBC 12/20/2016 3.97  3.80 - 5.20 MIL/uL Final  . Hemoglobin 12/20/2016 12.3  12.0 - 16.0 g/dL Final  . HCT 12/20/2016 37.1  35.0 - 47.0 % Final  . MCV 12/20/2016 93.5  80.0 - 100.0 fL Final  . MCH 12/20/2016 31.0  26.0 - 34.0 pg Final  . MCHC 12/20/2016 33.2  32.0 - 36.0 g/dL Final  . RDW 12/20/2016 13.6  11.5 - 14.5 % Final  . Platelets 12/20/2016 239  150 - 440 K/uL Final  . Neutrophils Relative % 12/20/2016 67  % Final  . Neutro Abs 12/20/2016 8.0* 1.4 - 6.5 K/uL Final  . Lymphocytes Relative 12/20/2016 24  % Final  . Lymphs Abs 12/20/2016 2.8  1.0 - 3.6 K/uL Final  . Monocytes Relative 12/20/2016 8  % Final  . Monocytes Absolute 12/20/2016 1.0* 0.2 - 0.9 K/uL Final  . Eosinophils Relative 12/20/2016 1  % Final  . Eosinophils Absolute 12/20/2016 0.1  0 - 0.7 K/uL Final  . Basophils Relative 12/20/2016 0  % Final  . Basophils Absolute 12/20/2016 0.0  0 - 0.1 K/uL Final  . Sodium 12/20/2016 141  135 - 145 mmol/L Final  . Potassium 12/20/2016 3.7   3.5 - 5.1 mmol/L Final  . Chloride 12/20/2016 108  101 - 111 mmol/L Final  . CO2 12/20/2016 25  22 - 32 mmol/L Final  . Glucose, Bld 12/20/2016 100* 65 - 99 mg/dL Final  . BUN 12/20/2016 13  6 - 20 mg/dL Final  . Creatinine, Ser 12/20/2016 0.91  0.44 - 1.00 mg/dL Final  . Calcium 12/20/2016 8.9  8.9 - 10.3 mg/dL Final  . Total Protein 12/20/2016 7.7  6.5 - 8.1 g/dL Final  . Albumin 12/20/2016 4.1  3.5 - 5.0 g/dL Final  . AST 12/20/2016 26  15 - 41 U/L Final  . ALT 12/20/2016 27  14 - 54 U/L Final  . Alkaline Phosphatase 12/20/2016 45  38 - 126 U/L Final  . Total Bilirubin 12/20/2016 1.1  0.3 - 1.2 mg/dL Final  . GFR calc non Af Amer 12/20/2016 >60  >60 mL/min Final  . GFR calc Af Amer 12/20/2016 >60  >60 mL/min Final   Comment: (NOTE) The eGFR has been calculated using the CKD EPI equation. This calculation has not been validated in all clinical situations. eGFR's persistently <60 mL/min signify possible Chronic Kidney Disease.   . Anion gap 12/20/2016 8  5 - 15 Final  . Color, Urine 12/21/2016 YELLOW* YELLOW Final  . APPearance 12/21/2016 HAZY* CLEAR Final  . Specific Gravity, Urine 12/21/2016 1.024  1.005 - 1.030 Final  . pH 12/21/2016 5.0  5.0 - 8.0 Final  . Glucose, UA 12/21/2016 NEGATIVE  NEGATIVE mg/dL Final  . Hgb urine dipstick 12/21/2016 MODERATE* NEGATIVE Final  . Bilirubin Urine 12/21/2016 NEGATIVE  NEGATIVE Final  . Ketones, ur 12/21/2016 NEGATIVE  NEGATIVE mg/dL Final  . Protein, ur 12/21/2016 NEGATIVE  NEGATIVE mg/dL Final  . Nitrite 12/21/2016 NEGATIVE  NEGATIVE Final  . Leukocytes, UA 12/21/2016 NEGATIVE  NEGATIVE Final  . RBC / HPF 12/21/2016 0-5  0 - 5 RBC/hpf Final  . WBC, UA 12/21/2016 0-5  0 - 5 WBC/hpf Final  . Bacteria, UA 12/21/2016 NONE SEEN  NONE SEEN Final  . Squamous Epithelial / LPF 12/21/2016 0-5* NONE SEEN Final  . Mucus 12/21/2016 PRESENT   Final    Assessment:  Allean Racette is a 36   y.o. female with a left lower extremity DVT following a 2 1/2  hour flight.  She has been on DepoProvera x 1 year.  She has a family history of thrombosis.  Left lower extremity duplex on 09/17/2016 revealed an acute appearing thrombus in the calf veins of the left lower extremity (posterior tibial and peroneal veins).  Bilateral lower extremity duplex on 11/20/2016 revealed no evidence of acute DVT within either lower extremity. There was some residual nonocclusive thrombus in the left perineal vein, sequela of previous DVT on the prior scan.  She is on Eliquis.    Hypercoagulable work-up on 09/26/2016 and 11/30/2016 revealed the following negative studies:  Factor V Leiden, prothrombin gene mutation, lupus anticoagulant panel, anticardiolipin antibodies, beta2-glycoprotein, protein S antigen/activity, and ATIII antigen/activity.  She may have a protein C deficiency.  Labs on 11/30/2016 revealed a protein C total of 40% (60 - 150%) and protein C activity of 49% (73-180%)  She has a monoclonal gammopathy of unknown significance.  SPEP on 09/26/2016 was negative.  Immunofixation revealed an IgA monoclonal protein with lambda light chain specificity.  Free light chain assay was normal.  IgG was 698 (700-1600).  IgA was 182 (87-352).  24 hour UPEP on 10/22/2016 revealed no monoclonal protein.    Chest, abdomen, and pelvic CT on 12/08/2016 revealed no lymphadenopathy or hepatosplenomegaly.  She underwent total laparoscopic hysterectomy with bilateral salpingectomy on 12/19/2016. She was off Eliquis x 3 days (2 days pre-op and day of surgery).  She developed a small superficial vein thrombus.  Symptomatically, is is feeling well. She has continued discomfort in her LEFT arm associated with a superficial thrombophlebitis s/p a peripheral IV. Exam reveals no adenopathy or hepatosplenomegaly.  Plan: 1.  Review interim events. 2.  Discuss retesting of protein C in 3 months.  If protein C deficiency confirmed, discuss life long anticoagulation. 3.  RTC on 03/29/2017 for  labs (CBC with diff, CMP, SPEP, protein C total, protein C activity) 4.  RTC on 04/05/2017 for MD assessment and review of labs.   Melissa C. Corcoran, MD  01/08/2017, 11:55 AM  

## 2017-01-08 NOTE — Progress Notes (Signed)
Patient states she has a migraine today 7/10.  Otherwise, no complaints.

## 2017-01-10 ENCOUNTER — Encounter: Payer: Self-pay | Admitting: Hematology and Oncology

## 2017-01-10 ENCOUNTER — Other Ambulatory Visit: Payer: Self-pay | Admitting: Family Medicine

## 2017-01-10 DIAGNOSIS — Z3042 Encounter for surveillance of injectable contraceptive: Secondary | ICD-10-CM

## 2017-01-10 NOTE — Telephone Encounter (Signed)
Spoke with patient and this must medication must have been on automatically refill because she had a hysterectomy 3 weeks ago. So she will no longer need this medication. She asked I call and D/C this medication from [facility]. I called them to informed them to D/C it and she no longer need this one.

## 2017-01-10 NOTE — Telephone Encounter (Signed)
Patient requesting refill of Depo to [facility].

## 2017-01-10 NOTE — Telephone Encounter (Signed)
She had a DVT and hematologist does not recommend Depo. She will have to discuss it with hematologist first

## 2017-01-23 ENCOUNTER — Telehealth: Payer: Self-pay | Admitting: *Deleted

## 2017-01-23 NOTE — Telephone Encounter (Signed)
Called patient at request of MD to advise patient to stop taking Celecoxib (anti-inflammatory) while she is on Eliquis.  Patient states she was prescribed the medication to manage her migraines.  It did not seem to help her so she only took two right after getting the prescription filled.  She has not taken any more.

## 2017-01-29 DIAGNOSIS — L74512 Primary focal hyperhidrosis, palms: Secondary | ICD-10-CM | POA: Diagnosis not present

## 2017-02-01 ENCOUNTER — Ambulatory Visit (INDEPENDENT_AMBULATORY_CARE_PROVIDER_SITE_OTHER): Payer: 59 | Admitting: Obstetrics & Gynecology

## 2017-02-01 ENCOUNTER — Encounter: Payer: Self-pay | Admitting: Obstetrics & Gynecology

## 2017-02-01 VITALS — BP 118/78 | HR 77 | Ht 66.0 in | Wt 176.0 lb

## 2017-02-01 DIAGNOSIS — D259 Leiomyoma of uterus, unspecified: Secondary | ICD-10-CM

## 2017-02-01 DIAGNOSIS — R87810 Cervical high risk human papillomavirus (HPV) DNA test positive: Secondary | ICD-10-CM

## 2017-02-01 DIAGNOSIS — N920 Excessive and frequent menstruation with regular cycle: Secondary | ICD-10-CM

## 2017-02-01 NOTE — Progress Notes (Signed)
  Postoperative Follow-up Patient presents post op from [facility] for abnormal uterine bleeding, fibroids and mild cerv dysplasia, 6 weeks ago.  Subjective: Patient reports marked improvement in her preop symptoms. Eating a regular diet without difficulty. The patient is not having any pain.  Activity: normal activities of daily living. Patient reports vaginal sx's of occas spotting for up to 4 weeks after surgery.  Is on Eliquis for Protein C deficiency.  Objective: BP 118/78   Pulse 77   Ht 5\' 6"  (1.676 m)   Wt 176 lb (79.8 kg)   LMP 01/04/2016   BMI 28.41 kg/m  Physical Exam  Constitutional: She is oriented to person, place, and time. She appears well-developed and well-nourished. No distress.  Genitourinary: Rectum normal and vagina normal. Pelvic exam was performed with patient supine. There is no rash, tenderness or lesion on the right labia. There is no rash, tenderness or lesion on the left labia. No erythema or bleeding in the vagina. Right adnexum does not display mass and does not display tenderness. Left adnexum does not display mass and does not display tenderness.  Genitourinary Comments: Cervix and uterus absent. Vaginal cuff healing well with small area of granulation tissue w contact bleeding w qtip  Cardiovascular: Normal rate.   Pulmonary/Chest: Effort normal.  Abdominal: Soft. She exhibits no distension. There is no tenderness.  Incision healing well.  Musculoskeletal: Normal range of motion.  Neurological: She is alert and oriented to person, place, and time. No cranial nerve deficit.  Skin: Skin is warm and dry.  Psychiatric: She has a normal mood and affect.    Assessment: s/p :  total laparoscopic hysterectomy with bilateral salpingectomy stable  Plan: Patient has done well after surgery with no apparent complications.  I have discussed the post-operative course to date, and the expected progress moving forward.  The patient understands what complications to be  concerned about.  I will see the patient in routine follow up, or sooner if needed.    Activity plan: No restriction.  Pelvic rest 2 more weeks and monitor for vag bleeding after sex.  Hoyt Koch 02/01/2017, 8:48 AM

## 2017-02-09 ENCOUNTER — Ambulatory Visit (INDEPENDENT_AMBULATORY_CARE_PROVIDER_SITE_OTHER): Payer: 59 | Admitting: Family Medicine

## 2017-02-09 ENCOUNTER — Encounter: Payer: Self-pay | Admitting: Family Medicine

## 2017-02-09 VITALS — BP 112/68 | HR 110 | Temp 98.2°F | Resp 16 | Ht 66.0 in | Wt 176.8 lb

## 2017-02-09 DIAGNOSIS — G43109 Migraine with aura, not intractable, without status migrainosus: Secondary | ICD-10-CM | POA: Diagnosis not present

## 2017-02-09 MED ORDER — PROMETHAZINE HCL 25 MG PO TABS: 12.5000 mg | ORAL_TABLET | Freq: Three times a day (TID) | ORAL | 0 refills | Status: DC | PRN

## 2017-02-09 NOTE — Patient Instructions (Addendum)
Rest in a dark, cool place, use ice packs on your forehead.  Take Phenergan as prescribed Take Baclofen twice daily. If not improving in 24 hours, please go to the ER for further evaluation.  If anything worsens or if new symptoms occur, please go to the ER right away. Migraine Headache A migraine headache is a very strong throbbing pain on one side or both sides of your head. Migraines can also cause other symptoms. Talk with your doctor about what things may bring on (trigger) your migraine headaches. Follow these instructions at home: Medicines  Take over-the-counter and prescription medicines only as told by your doctor.  Do not drive or use heavy machinery while taking prescription pain medicine.  To prevent or treat constipation while you are taking prescription pain medicine, your doctor may recommend that you: ? Drink enough fluid to keep your pee (urine) clear or pale yellow. ? Take over-the-counter or prescription medicines. ? Eat foods that are high in fiber. These include fresh fruits and vegetables, whole grains, and beans. ? Limit foods that are high in fat and processed sugars. These include fried and sweet foods. Lifestyle  Avoid alcohol.  Do not use any products that contain nicotine or tobacco, such as cigarettes and e-cigarettes. If you need help quitting, ask your doctor.  Get at least 8 hours of sleep every night.  Limit your stress. General instructions   Keep a journal to find out what may bring on your migraines. For example, write down: ? What you eat and drink. ? How much sleep you get. ? Any change in what you eat or drink. ? Any change in your medicines.  If you have a migraine: ? Avoid things that make your symptoms worse, such as bright lights. ? It may help to lie down in a dark, quiet room. ? Do not drive or use heavy machinery. ? Ask your doctor what activities are safe for you.  Keep all follow-up visits as told by your doctor. This is  important. Contact a doctor if:  You get a migraine that is different or worse than your usual migraines. Get help right away if:  Your migraine gets very bad.  You have a fever.  You have a stiff neck.  You have trouble seeing.  Your muscles feel weak or like you cannot control them.  You start to lose your balance a lot.  You start to have trouble walking.  You pass out (faint). This information is not intended to replace advice given to you by your health care provider. Make sure you discuss any questions you have with your health care provider. Document Released: 02/08/2008 Document Revised: 11/19/2015 Document Reviewed: 10/18/2015 Elsevier Interactive Patient Education  2017 [employer].

## 2017-02-09 NOTE — Progress Notes (Addendum)
Name: Tammy Garza   MRN: [id number]    DOB: [birth year]   Date:02/09/2017       Progress Note  Subjective  Chief Complaint  Chief Complaint  Patient presents with  . Headache    for 3 days  . Nausea    HPI  - Patient presents with complaint of migraine x3 days. Has history of chronic migraines and is currently prescribed baclofen.  She was started on Eliquis in May 2018 due to DVT - never had PE, but she is no longer able to take NSAIDS.  No signs of bleeding - no dark/tarry stools or bloody stools, no gum bleeding or epistaxis.  She has been taking tylenol without relief.  She took baclofen this morning without relief. She drove herself to the clinic, so we cannot provide phenergan injection. - Current Symptoms: Photophobic and phonophobic, nauseous, bending down/position changes worsens the pain, throbbing pain on right side - all of which are normal for her when a migraine lasts for several days. No weakness in any extremity, facial droop, confusion, speech changes, vomiting, or diarrhea.  - She sees Dr. Orie Rout at [facility] - called office today to discuss patient's plan of care and was unable to reach a healthcare provider.  Patient Active Problem List   Diagnosis Date Noted  . Low protein C activity level ([place]) 12/10/2016  . Monoclonal gammopathy of unknown significance (MGUS) 11/30/2016  . Fibroid 11/22/2016  . Menorrhagia with regular cycle 11/02/2016  . Menstrual migraine without status migrainosus, not intractable 11/02/2016  . History of DVT (deep vein thrombosis) 11/02/2016  . Elevated blood protein 09/26/2016  . Acute deep vein thrombosis (DVT) of distal lower extremity ([place]) 09/17/2016  . Vitamin D deficiency 07/26/2015  . Thyroid cyst 07/26/2015  . Pap smear of cervix shows high risk HPV present 04/22/2015  . History of Helicobacter pylori infection 04/22/2015  . Migraine with aura and without status migrainosus 04/22/2015  . Shift  work sleep disorder 04/22/2015    Social History  Substance Use Topics  . Smoking status: Former Smoker    Packs/day: 0.25    Years: 5.00    Types: Cigarettes    Quit date: 05/15/2012  . Smokeless tobacco: Never Used  . Alcohol use 0.6 oz/week    1 Glasses of wine per week     Comment: occ wine     Current Outpatient Prescriptions:  .  baclofen (LIORESAL) 10 MG tablet, TAKE 1/2 - 1 TAB TWICE DAILY AS NEEDED **LIMIT TO 1-2 HEADACHE EPISODES PER WEEK. AVOID DAILY USE., Disp: , Rfl: 0 .  celecoxib (CELEBREX) 100 MG capsule, Take 100 mg by mouth as needed., Disp: , Rfl: 1 .  ELIQUIS 5 MG TABS tablet, TAKE 1 TABLET BY MOUTH TWICE A DAY, Disp: 60 tablet, Rfl: 1 .  glycopyrrolate (ROBINUL) 1 MG tablet, TAKE ONE OR TWO PILLS AS DIRECTED., Disp: , Rfl: 2 .  Vitamin D, Ergocalciferol, (DRISDOL) 50000 units CAPS capsule, TAKE 1 CAPSULE (50,000 UNITS TOTAL) BY MOUTH EVERY 7 (SEVEN) DAYS., Disp: 12 capsule, Rfl: 0 .  oxyCODONE-acetaminophen (PERCOCET/ROXICET) 5-325 MG tablet, Take 1-2 tablets by mouth every 6 (six) hours as needed. (Patient not taking: Reported on 02/09/2017), Disp: 42 tablet, Rfl: 0 .  polyethylene glycol (MIRALAX / GLYCOLAX) packet, Take 17 g by mouth daily. Mix one tablespoon with 8oz of your favorite juice or water every day until you are having soft formed stools. Then start taking once daily if you didn't have  a stool the day before. (Patient not taking: Reported on 02/09/2017), Disp: 30 each, Rfl: 0  No Known Allergies  ROS  Ten systems reviewed and is negative except as mentioned in HPI  Objective  Vitals:   02/09/17 1109  BP: 112/68  Pulse: (!) 110  Resp: 16  Temp: 98.2 F (36.8 C)  TempSrc: Oral  SpO2: 95%  Weight: 176 lb 12.8 oz (80.2 kg)  Height: _0  (1.676 m)   Body mass index is 28.54 kg/m.  Nursing Note and Vital Signs reviewed.  Physical Exam  Constitutional: Patient appears well-developed and well-nourished. Obese No distress.  HEENT: head  atraumatic, normocephalic, pupils equal and reactive to light, EOM's intact, TM's without erythema or bulging, no maxillary or frontal sinus pain on palpation, neck supple without lymphadenopathy, oropharynx pink and moist without exudate Cardiovascular: Normal rate, regular rhythm, S1/S2 present.  No murmur or rub heard. No BLE edema. Pulmonary/Chest: Effort normal and breath sounds clear. No respiratory distress or retractions. Psychiatric: Patient has a normal mood and affect. behavior is normal. Judgment and thought content normal. Neurological: she is alert and oriented to person, place, and time. No cranial nerve deficit. Coordination, balance, strength, speech and gait are normal.  Photophobia evident during pupillary examination.   Recent Results (from the past 2160 hour(s))  Lactate dehydrogenase     Status: None   Collection Time: 11/30/16 10:06 AM  Result Value Ref Range   LDH 109 98 - 192 U/L  Uric acid     Status: None   Collection Time: 11/30/16 10:06 AM  Result Value Ref Range   Uric Acid, Serum 4.4 2.3 - 6.6 mg/dL  Protein C, total     Status: Abnormal   Collection Time: 11/30/16 10:06 AM  Result Value Ref Range   Protein C, Total 40 (L) 60 - 150 %    Comment: (NOTE) A deficiency of protein C (PC), either congenital or acquired, increases the risk of thromboembolism. Congenital deficiencies of PC are very rare; acquired PC deficiency is much more common. PC levels can be transiently diminished after an acute thrombotic event. Oral anticoagulant therapy with warfarin will lower PC levels as well as vitamin K deficiency. Acquired deficiency can also occur in individuals with disseminated intravascular coagulation (DIC), sepsis, severe liver disease, nephrotic syndrome, and in inflammatory bowel disease. Levels may be spuriously decreased in individuals with Factor V Leiden. Drug therapy with L-asparaginse, fluorouracil, methotrexate, cyclophosphamide or tamoxifen can  also reduce PC levels. It has been suggested that repeat blood sampling and testing after ruling out acquired causes of deficiency should be performed before the patient is diagnosed with congenital Protein C deficiency. Performed At: [facility] [place], [place] [id number] Lindon Romp MD ZH:[id number]   Protein S activity     Status: None   Collection Time: 11/30/16 10:06 AM  Result Value Ref Range   Protein S Activity 91 63 - 140 %    Comment: (NOTE) Protein S activity may be falsely increased (masking an abnormal, low result) in patients receiving direct Xa inhibitor (e.g., rivaroxaban, apixaban, edoxaban) or a direct thrombin inhibitor (e.g., dabigatran) anticoagulant treatment due to assay interference by these drugs. Performed At: [facility] [place], [place] [id number] Lindon Romp MD [id number]:[id number]   Protein S, total     Status: None   Collection Time: 11/30/16 10:06 AM  Result Value Ref Range   Protein S Ag, Total 76 60 - 150 %  Comment: (NOTE) This test was developed and its performance characteristics determined by [facility]. It has not been cleared or approved by the Food and Drug Administration. Performed At: [facility] [place], [place] [id number] Lindon Romp MD [id number]   Antithrombin panel     Status: None   Collection Time: 11/30/16 10:06 AM  Result Value Ref Range   Antithrombin Activity 135 75 - 135 %    Comment: (NOTE) Direct Xa inhibitor anticoagulants such as rivaroxaban, apixaban and edoxaban will lead to spuriously elevated antithrombin activity levels possibly masking a deficiency.    AT III AG PPP IMM-ACNC 94 72 - 124 %    Comment: (NOTE) This test was developed and its performance characteristics determined by [facility]. It has not been cleared or approved by the Food and Drug Administration. Performed At: [facility] [place], [place] [id number] Lindon Romp MD WF:[id number]   CBC with Differential     Status: None   Collection Time: 11/30/16 10:06 AM  Result Value Ref Range   WBC 6.9 3.6 - 11.0 K/uL   RBC 4.05 3.80 - 5.20 MIL/uL   Hemoglobin 12.8 12.0 - 16.0 g/dL   HCT 37.5 35.0 - 47.0 %   MCV 92.4 80.0 - 100.0 fL   MCH 31.5 26.0 - 34.0 pg   MCHC 34.1 32.0 - 36.0 g/dL   RDW 13.8 11.5 - 14.5 %   Platelets 262 150 - 440 K/uL   Neutrophils Relative % 47 %   Neutro Abs 3.2 1.4 - 6.5 K/uL   Lymphocytes Relative 40 %   Lymphs Abs 2.8 1.0 - 3.6 K/uL   Monocytes Relative 9 %   Monocytes Absolute 0.6 0.2 - 0.9 K/uL   Eosinophils Relative 3 %   Eosinophils Absolute 0.2 0 - 0.7 K/uL   Basophils Relative 1 %   Basophils Absolute 0.1 0 - 0.1 K/uL  Comprehensive metabolic panel     Status: None   Collection Time: 11/30/16 10:06 AM  Result Value Ref Range   Sodium 137 135 - 145 mmol/L   Potassium 4.0 3.5 - 5.1 mmol/L   Chloride 106 101 - 111 mmol/L   CO2 25 22 - 32 mmol/L   Glucose, Bld 88 65 - 99 mg/dL   BUN 10 6 - 20 mg/dL   Creatinine, Ser 0.73 0.44 - 1.00 mg/dL   Calcium 9.1 8.9 - 10.3 mg/dL   Total Protein 7.7 6.5 - 8.1 g/dL   Albumin 4.2 3.5 - 5.0 g/dL   AST 15 15 - 41 U/L   ALT 15 14 - 54 U/L   Alkaline Phosphatase 46 38 - 126 U/L   Total Bilirubin 0.8 0.3 - 1.2 mg/dL   GFR calc non Af Amer >60 >60 mL/min   GFR calc Af Amer >60 >60 mL/min    Comment: (NOTE) The eGFR has been calculated using the CKD EPI equation. This calculation has not been validated in all clinical situations. eGFR's persistently <60 mL/min signify possible Chronic Kidney Disease.    Anion gap 6 5 - 15  Protein C activity     Status: Abnormal   Collection Time: 11/30/16 10:06 AM  Result Value Ref Range   Protein C Activity 49 (L) 73 - 180 %    Comment: (NOTE) A deficiency of protein C (PC), either congenital or acquired, increases the risk of thromboembolism. Acquired PC deficiency occurs more frequently  than congenital deficiency. PC levels can be transiently diminished  after a thrombotic event or surgery or in the presence of certain anticoagulants. Heparin, direct Xa inhibitor, or thrombotic inhibitor therapy does not alter PC levels physiologically and does not interfere with this assay because it is chromogenic and clot-based. Vitamin K antagonist therapy may decrease plasma levels of functional protein C (PC) as PC is a vitamin K- dependent protein. Vitamin K deficiency, due to dietary insufficiency or malabsorption will also lead to reduced PC levels. Acquired deficiency can be found in individuals with disseminated intravascular coagulation (DIC) and sepsis. Severe hepatic disorders (hepatitis, cirrhosis, etc.), nephrotic syndrome, malignancy and inflammatory bowel disease can lead to diminished PC levels. Drug  therapy with L-asparaginse or fluorouracil can also reduce PC levels. Levels may be decreased in patients with polycythemia vera, sickle cell disease and essential thrombocythemia. Repeat evaluation on a new plasma sample to confirm or refute this result should be considered, after ruling out acquired causes, depending on the clinical scenario. Performed At: [facility] [place], [place] [id number] Lindon Romp MD [id number]   CBC     Status: None   Collection Time: 12/12/16 12:15 PM  Result Value Ref Range   WBC 8.1 3.6 - 11.0 K/uL   RBC 4.52 3.80 - 5.20 MIL/uL   Hemoglobin 13.8 12.0 - 16.0 g/dL   HCT 41.7 35.0 - 47.0 %   MCV 92.3 80.0 - 100.0 fL   MCH 30.5 26.0 - 34.0 pg   MCHC 33.0 32.0 - 36.0 g/dL   RDW 13.7 11.5 - 14.5 %   Platelets 262 150 - 440 K/uL  Type and screen [facility]     Status: None   Collection Time: 12/12/16 12:15 PM  Result Value Ref Range   ABO/RH(D) B POS    Antibody Screen NEG    Sample Expiration 12/26/2016    Extend sample reason NO TRANSFUSIONS OR PREGNANCY IN THE PAST 3 MONTHS    Pregnancy, urine POC     Status: None   Collection Time: 12/19/16  9:28 AM  Result Value Ref Range   Preg Test, Ur NEGATIVE NEGATIVE    Comment:        THE SENSITIVITY OF THIS METHODOLOGY IS >24 mIU/mL   ABO/Rh     Status: None   Collection Time: 12/19/16  9:32 AM  Result Value Ref Range   ABO/RH(D) B POS   Surgical pathology     Status: None   Collection Time: 12/19/16 11:44 AM  Result Value Ref Range   SURGICAL PATHOLOGY      Surgical Pathology CASE: [id number] PATIENT: Natilie Balentine Surgical Pathology Report     SPECIMEN SUBMITTED: A. Uterus, cervix, bilateral fallopian tubes  CLINICAL HISTORY: None provided  PRE-OPERATIVE DIAGNOSIS: Menorrhagia, pelvic pain, fibroid, menstrual migraine  POST-OPERATIVE DIAGNOSIS: Same as pre-op     DIAGNOSIS: A. UTERUS WITH CERVIX; HYSTERECTOMY: - CERVIX WITHOUT PATHOLOGIC CHANGES. - ATROPHIC ENDOMETRIUM. - MILD ADENOMYOSIS. 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  BILATERAL FALLOPIAN TUBES; SALPINGECTOMY: - NO PATHOLOGIC CHANGES.   GROSS DESCRIPTION: A. The specimen is received in a formalin-filled container labeled with the patient's name and uterus, cervix, bilateral fallopian tubes. Adnexa: Attached bilateral fallopian tubes, ovaries not received. Weight: 151 grams Dimensions:      Fundus - 6.5 x 4.5 cm      Cervix -4.3 x 4.2 x 3.6 cm Serosa: Pink red and smooth, with posterior subserosal bulge Cervix: Pink purple and som ewhat congested Endocervix: Pale-tan and smooth Endometrial cavity:  Dimensions - 4 x 2 x 0.3 cm      Thickness - 0.1 cm      Description - unremarkable Myometrium:     Thickness - 4.2 cm     Description - posterior circumscribed rubbery subserosal mass, 3 x 2.3 x 2.6 cm, which has a whorled pattern with areas of speckled hemorrhage. Adnexa:      Right fallopian tube: Weighs 2 g           Measurements - 5.3 cm in length x 0.5-0.7 cm in diameter           Other findings -  previously ligated, and a paratubal cyst measuring 0.6 cm in diameter,      Left fallopian tube: Weighs 2 g            Measurements - 5.5 cm in length x 0.4-0.7 cm in diameter           Other findings - previously ligated, otherwise unremarkable.  Block summary: 1-2 - cervix 3- lower uterine segments 4-7-endomyometrium 8- subserosal nodule 9-10-entire longitudinally sectioned fimbriated end of right fallopian tube and representative cross-sections 11-12-entire largest piece sectioned fimb riated end of left fallopian tube and representative cross-sections  Final Diagnosis performed by Bryan Lemma, MD.  Electronically signed 12/21/2016 6:51:20PM    The electronic signature indicates that the named Attending Pathologist has evaluated the specimen  Technical component performed at [facility], [address], [place], [place] [address] Lab: [phone] Dir: Darrick Penna. Evette Doffing, MD  Professional component performed at [facility], [facility], [place], [place], [place] [address] Lab: [phone] Dir: Dellia Nims. Rubinas, MD    CBC with Differential/Platelet     Status: Abnormal   Collection Time: 12/20/16 11:50 PM  Result Value Ref Range   WBC 12.0 (H) 3.6 - 11.0 K/uL   RBC 3.97 3.80 - 5.20 MIL/uL   Hemoglobin 12.3 12.0 - 16.0 g/dL   HCT 37.1 35.0 - 47.0 %   MCV 93.5 80.0 - 100.0 fL   MCH 31.0 26.0 - 34.0 pg   MCHC 33.2 32.0 - 36.0 g/dL   RDW 13.6 11.5 - 14.5 %   Platelets 239 150 - 440 K/uL   Neutrophils Relative % 67 %   Neutro Abs 8.0 (H) 1.4 - 6.5 K/uL   Lymphocytes Relative 24 %   Lymphs Abs 2.8 1.0 - 3.6 K/uL   Monocytes Relative 8 %   Monocytes Absolute 1.0 (H) 0.2 - 0.9 K/uL   Eosinophils Relative 1 %   Eosinophils Absolute 0.1 0 - 0.7 K/uL   Basophils Relative 0 %   Basophils Absolute 0.0 0 - 0.1 K/uL  Comprehensive metabolic panel     Status: Abnormal   Collection Time: 12/20/16 11:50 PM  Result Value Ref Range   Sodium 141 135 - 145 mmol/L    Potassium 3.7 3.5 - 5.1 mmol/L   Chloride 108 101 - 111 mmol/L   CO2 25 22 - 32 mmol/L   Glucose, Bld 100 (H) 65 - 99 mg/dL   BUN 13 6 - 20 mg/dL   Creatinine, Ser 0.91 0.44 - 1.00 mg/dL   Calcium 8.9 8.9 - 10.3 mg/dL   Total Protein 7.7 6.5 - 8.1 g/dL   Albumin 4.1 3.5 - 5.0 g/dL   AST 26 15 - 41 U/L   ALT 27 14 - 54 U/L   Alkaline Phosphatase 45 38 - 126 U/L   Total Bilirubin 1.1 0.3 - 1.2 mg/dL   GFR  calc non Af Amer >60 >60 mL/min   GFR calc Af Amer >60 >60 mL/min    Comment: (NOTE) The eGFR has been calculated using the CKD EPI equation. This calculation has not been validated in all clinical situations. eGFR's persistently <60 mL/min signify possible Chronic Kidney Disease.    Anion gap 8 5 - 15  Urinalysis, Complete w Microscopic     Status: Abnormal   Collection Time: 12/21/16  1:55 AM  Result Value Ref Range   Color, Urine YELLOW (A) YELLOW   APPearance HAZY (A) CLEAR   Specific Gravity, Urine 1.024 1.005 - 1.030   pH 5.0 5.0 - 8.0   Glucose, UA NEGATIVE NEGATIVE mg/dL   Hgb urine dipstick MODERATE (A) NEGATIVE   Bilirubin Urine NEGATIVE NEGATIVE   Ketones, ur NEGATIVE NEGATIVE mg/dL   Protein, ur NEGATIVE NEGATIVE mg/dL   Nitrite NEGATIVE NEGATIVE   Leukocytes, UA NEGATIVE NEGATIVE   RBC / HPF 0-5 0 - 5 RBC/hpf   WBC, UA 0-5 0 - 5 WBC/hpf   Bacteria, UA NONE SEEN NONE SEEN   Squamous Epithelial / LPF 0-5 (A) NONE SEEN   Mucus PRESENT      Assessment & Plan  1. Migraine with aura and without status migrainosus, not intractable - promethazine (PHENERGAN) 25 MG tablet; Take 0.5-1 tablets (12.5-25 mg total) by mouth every 8 (eight) hours as needed for nausea or vomiting.  Dispense: 20 tablet; Refill: 0 - Recommend to continue Baclofen and Tylenol until relief occurs. If symptoms not improving in 24 hours, recommend Urgent Care/ER visit for further evaluation and treatment. -Red flags and when to present for emergency care or RTC including fever >101.76F, chest  pain, shortness of breath, new/worsening/un-resolving symptoms, confusion, facial droop, extremity weakness, or other concerning symptoms reviewed with patient at time of visit. Follow up and care instructions discussed and provided in AVS.  I have reviewed this encounter including the documentation in this note and/or discussed this patient with the Johney Maine, FNP, NP-C. I am certifying that I agree with the content of this note as supervising physician.  [name], MD [facility] 02/12/2017, 7:42 AM

## 2017-02-11 ENCOUNTER — Other Ambulatory Visit: Payer: Self-pay | Admitting: Hematology and Oncology

## 2017-02-15 ENCOUNTER — Encounter: Payer: Self-pay | Admitting: Hematology and Oncology

## 2017-02-26 ENCOUNTER — Encounter: Payer: Self-pay | Admitting: Obstetrics & Gynecology

## 2017-02-26 ENCOUNTER — Telehealth: Payer: Self-pay | Admitting: Obstetrics & Gynecology

## 2017-02-26 NOTE — Telephone Encounter (Signed)
-----   Message -----  From: Gae Dry, MD  Sent: 02/26/2017  2:27 PM  To: Quintella Baton, CMA  Subject: FW: Visit Follow-Up Question             Pt desires appt for bleeding after hyst    ----- Message -----  From: Quintella Baton, CMA  Sent: 02/26/2017  1:39 PM  To: Gae Dry, MD  Subject: FW: Visit Follow-Up Question                 ----- Message -----  From: Tammy Garza  Sent: 02/26/2017 11:52 AM  To: [facility]  Subject: Visit Follow-Up Question               Bleeding occurred during sex and continued afterwards. Bleeding was heavy and bright red in color. I would like to be seen sooner if possible.    Called and left voicemail for patient to call back to be schedule

## 2017-02-26 NOTE — Telephone Encounter (Signed)
Pt desires appt for bleeding after hyst

## 2017-02-26 NOTE — Telephone Encounter (Signed)
Pt schedule is schedule with [facility]

## 2017-02-27 ENCOUNTER — Encounter: Payer: Self-pay | Admitting: Obstetrics & Gynecology

## 2017-02-27 ENCOUNTER — Ambulatory Visit (INDEPENDENT_AMBULATORY_CARE_PROVIDER_SITE_OTHER): Payer: 59 | Admitting: Obstetrics & Gynecology

## 2017-02-27 VITALS — BP 100/60 | HR 72 | Ht 66.0 in | Wt 174.0 lb

## 2017-02-27 DIAGNOSIS — M222X9 Patellofemoral disorders, unspecified knee: Secondary | ICD-10-CM | POA: Diagnosis not present

## 2017-02-27 DIAGNOSIS — N939 Abnormal uterine and vaginal bleeding, unspecified: Secondary | ICD-10-CM | POA: Diagnosis not present

## 2017-02-27 NOTE — Progress Notes (Signed)
HPI:      [name] is a 35 y.o. [id number] who LMP has discontinued since hysterectomy, presents today for a problem visit.  She complains of an episode of bleeding after sex that  began 2 days ago and its severity is described as moderate.  She has had only one attempt at sex this year (since [place]) and there was associated sx of pain then but not now. No modifiers, stopped on its own.  TLH was 10 weeks ago.  PMHx: She  has a past medical history of Anemia; Cervical high risk human papillomavirus (HPV) DNA test positive; Chronic gastritis; Depression; DVT (deep venous thrombosis) ([place]) (09/2016); H. pylori infection; History of attempted suicide; History of gastric polyp; History of kidney stones; Migraines; Protein C deficiency ([place]); Rash; and Shift work sleep disorder. Also,  has a past surgical history that includes Tubal ligation; Cholecystectomy; Laparoscopic hysterectomy (Bilateral, 12/19/2016); and Cystoscopy (12/19/2016)., family history includes Asthma in her daughter; COPD in her paternal grandmother; Cancer (age of onset: 94) in her sister; Cancer (age of onset: 30) in her father; Deep vein thrombosis in her father and paternal grandmother; HIV in her sister; Hypertension in her maternal grandmother and mother; Obesity in her mother.,  reports that she quit smoking about 4 years ago. Her smoking use included Cigarettes. She has a 1.25 pack-year smoking history. She has never used smokeless tobacco. She reports that she drinks about 0.6 oz of alcohol per week . She reports that she does not use drugs.  She  Current Outpatient Prescriptions:  .  baclofen (LIORESAL) 10 MG tablet, TAKE 1/2 - 1 TAB TWICE DAILY AS NEEDED **LIMIT TO 1-2 HEADACHE EPISODES PER WEEK. AVOID DAILY USE., Disp: , Rfl: 0 .  ELIQUIS 5 MG TABS tablet, TAKE 1 TABLET BY MOUTH TWICE A DAY, Disp: 60 tablet, Rfl: 1 .  glycopyrrolate (ROBINUL) 1 MG tablet, TAKE ONE OR TWO PILLS AS DIRECTED., Disp: , Rfl: 2 .  polyethylene glycol  (MIRALAX / GLYCOLAX) packet, Take 17 g by mouth daily. Mix one tablespoon with 8oz of your favorite juice or water every day until you are having soft formed stools. Then start taking once daily if you didn't have a stool the day before., Disp: 30 each, Rfl: 0 .  promethazine (PHENERGAN) 25 MG tablet, Take 0.5-1 tablets (12.5-25 mg total) by mouth every 8 (eight) hours as needed for nausea or vomiting., Disp: 20 tablet, Rfl: 0 .  Vitamin D, Ergocalciferol, (DRISDOL) 50000 units CAPS capsule, TAKE 1 CAPSULE (50,000 UNITS TOTAL) BY MOUTH EVERY 7 (SEVEN) DAYS., Disp: 12 capsule, Rfl: 0  Also, has No Known Allergies.  Review of Systems  Constitutional: Negative for chills, fever and malaise/fatigue.  HENT: Negative for congestion, sinus pain and sore throat.   Eyes: Negative for blurred vision and pain.  Respiratory: Negative for cough and wheezing.   Cardiovascular: Negative for chest pain and leg swelling.  Gastrointestinal: Negative for abdominal pain, constipation, diarrhea, heartburn, nausea and vomiting.  Genitourinary: Negative for dysuria, frequency, hematuria and urgency.  Musculoskeletal: Negative for back pain, joint pain, myalgias and neck pain.  Skin: Negative for itching and rash.  Neurological: Negative for dizziness, tremors and weakness.  Endo/Heme/Allergies: Does not bruise/bleed easily.  Psychiatric/Behavioral: Negative for depression. The patient is not nervous/anxious and does not have insomnia.    Objective: BP 100/60   Pulse 72   Ht 5\' 6"  (1.676 m)   Wt 174 lb (78.9 kg)   LMP 01/04/2016   BMI  28.08 kg/m  Physical Exam  Constitutional: She is oriented to person, place, and time. She appears well-developed and well-nourished. No distress.  Genitourinary: Vagina normal. Pelvic exam was performed with patient supine. There is no rash, tenderness or lesion on the right labia. There is no rash, tenderness or lesion on the left labia. No erythema or bleeding in the vagina.    Genitourinary Comments: Absent uterus and cervix Small 3 mm apical vaginal granulation tissue    Q tip probe reveals no deep defect or dehiscence  Abdominal: Soft. She exhibits no distension. There is no tenderness.  Musculoskeletal: Normal range of motion.  Neurological: She is alert and oriented to person, place, and time. No cranial nerve deficit.  Skin: Skin is warm and dry.  Psychiatric: She has a normal mood and affect.   ASSESSMENT/PLAN:  Abnormal vaginal bleeding.  Vaginal granulation tissue.  Problem List Items Addressed This Visit      Other   Vagina bleeding - Primary    AgNO3 treatment to vaginal apical granulation tissue today, no deep defects or dehiscence noted.  Follow up if bleeding recurs.  Alternatives d/w pt as to surgery to treat defect with sutures to this area.   Barnett Applebaum, MD, [facility], [facility] 02/27/2017  9:44 AM

## 2017-03-07 DIAGNOSIS — R609 Edema, unspecified: Secondary | ICD-10-CM | POA: Diagnosis not present

## 2017-03-07 DIAGNOSIS — M25562 Pain in left knee: Secondary | ICD-10-CM | POA: Diagnosis not present

## 2017-03-07 DIAGNOSIS — R29898 Other symptoms and signs involving the musculoskeletal system: Secondary | ICD-10-CM | POA: Diagnosis not present

## 2017-03-14 DIAGNOSIS — R29898 Other symptoms and signs involving the musculoskeletal system: Secondary | ICD-10-CM | POA: Diagnosis not present

## 2017-03-14 DIAGNOSIS — M25562 Pain in left knee: Secondary | ICD-10-CM | POA: Diagnosis not present

## 2017-03-16 DIAGNOSIS — M25562 Pain in left knee: Secondary | ICD-10-CM | POA: Diagnosis not present

## 2017-03-16 DIAGNOSIS — R29898 Other symptoms and signs involving the musculoskeletal system: Secondary | ICD-10-CM | POA: Diagnosis not present

## 2017-03-19 ENCOUNTER — Other Ambulatory Visit: Payer: 59

## 2017-03-19 ENCOUNTER — Ambulatory Visit: Payer: 59 | Admitting: Hematology and Oncology

## 2017-03-21 DIAGNOSIS — M25569 Pain in unspecified knee: Secondary | ICD-10-CM | POA: Diagnosis not present

## 2017-03-21 DIAGNOSIS — M6281 Muscle weakness (generalized): Secondary | ICD-10-CM | POA: Diagnosis not present

## 2017-03-26 DIAGNOSIS — M6281 Muscle weakness (generalized): Secondary | ICD-10-CM | POA: Diagnosis not present

## 2017-03-26 DIAGNOSIS — M25562 Pain in left knee: Secondary | ICD-10-CM | POA: Diagnosis not present

## 2017-03-29 ENCOUNTER — Inpatient Hospital Stay: Payer: 59 | Attending: Hematology and Oncology

## 2017-03-29 DIAGNOSIS — F329 Major depressive disorder, single episode, unspecified: Secondary | ICD-10-CM | POA: Insufficient documentation

## 2017-03-29 DIAGNOSIS — I824Z9 Acute embolism and thrombosis of unspecified deep veins of unspecified distal lower extremity: Secondary | ICD-10-CM

## 2017-03-29 DIAGNOSIS — Z79899 Other long term (current) drug therapy: Secondary | ICD-10-CM | POA: Diagnosis not present

## 2017-03-29 DIAGNOSIS — Z87442 Personal history of urinary calculi: Secondary | ICD-10-CM | POA: Diagnosis not present

## 2017-03-29 DIAGNOSIS — Z915 Personal history of self-harm: Secondary | ICD-10-CM | POA: Diagnosis not present

## 2017-03-29 DIAGNOSIS — Z86718 Personal history of other venous thrombosis and embolism: Secondary | ICD-10-CM | POA: Insufficient documentation

## 2017-03-29 DIAGNOSIS — D472 Monoclonal gammopathy: Secondary | ICD-10-CM | POA: Diagnosis not present

## 2017-03-29 DIAGNOSIS — Z7901 Long term (current) use of anticoagulants: Secondary | ICD-10-CM | POA: Diagnosis not present

## 2017-03-29 DIAGNOSIS — Z8249 Family history of ischemic heart disease and other diseases of the circulatory system: Secondary | ICD-10-CM | POA: Diagnosis not present

## 2017-03-29 DIAGNOSIS — Z87891 Personal history of nicotine dependence: Secondary | ICD-10-CM | POA: Insufficient documentation

## 2017-03-29 DIAGNOSIS — D6859 Other primary thrombophilia: Secondary | ICD-10-CM

## 2017-03-29 LAB — CBC WITH DIFFERENTIAL/PLATELET
Basophils Absolute: 0.1 10*3/uL (ref 0–0.1)
Basophils Relative: 1 %
Eosinophils Absolute: 0.1 10*3/uL (ref 0–0.7)
Eosinophils Relative: 2 %
HCT: 38.4 % (ref 35.0–47.0)
Hemoglobin: 12.7 g/dL (ref 12.0–16.0)
Lymphocytes Relative: 36 %
Lymphs Abs: 2.2 10*3/uL (ref 1.0–3.6)
MCH: 30.5 pg (ref 26.0–34.0)
MCHC: 33.2 g/dL (ref 32.0–36.0)
MCV: 91.8 fL (ref 80.0–100.0)
Monocytes Absolute: 0.6 10*3/uL (ref 0.2–0.9)
Monocytes Relative: 10 %
Neutro Abs: 3.1 10*3/uL (ref 1.4–6.5)
Neutrophils Relative %: 51 %
Platelets: 259 10*3/uL (ref 150–440)
RBC: 4.18 MIL/uL (ref 3.80–5.20)
RDW: 13.4 % (ref 11.5–14.5)
WBC: 6.1 10*3/uL (ref 3.6–11.0)

## 2017-03-29 LAB — COMPREHENSIVE METABOLIC PANEL
ALT: 14 U/L (ref 14–54)
AST: 15 U/L (ref 15–41)
Albumin: 4.6 g/dL (ref 3.5–5.0)
Alkaline Phosphatase: 54 U/L (ref 38–126)
Anion gap: 10 (ref 5–15)
BUN: 11 mg/dL (ref 6–20)
CO2: 24 mmol/L (ref 22–32)
Calcium: 9.2 mg/dL (ref 8.9–10.3)
Chloride: 103 mmol/L (ref 101–111)
Creatinine, Ser: 0.72 mg/dL (ref 0.44–1.00)
GFR calc Af Amer: >60 mL/min (ref >60)
GFR calc non Af Amer: >60 mL/min (ref >60)
Glucose, Bld: 86 mg/dL (ref 65–99)
Potassium: 3.8 mmol/L (ref 3.5–5.1)
Sodium: 137 mmol/L (ref 135–145)
Total Bilirubin: 1.1 mg/dL (ref 0.3–1.2)
Total Protein: 8.1 g/dL (ref 6.5–8.1)

## 2017-03-30 LAB — PROTEIN ELECTROPHORESIS, SERUM
A/G Ratio: 1.2 (ref 0.7–1.7)
Albumin ELP: 4.1 g/dL (ref 2.9–4.4)
Alpha-1-Globulin: 0.2 g/dL (ref 0.0–0.4)
Alpha-2-Globulin: 0.7 g/dL (ref 0.4–1.0)
Beta Globulin: 1 g/dL (ref 0.7–1.3)
Gamma Globulin: 1.5 g/dL (ref 0.4–1.8)
Globulin, Total: 3.4 g/dL (ref 2.2–3.9)
Total Protein ELP: 7.5 g/dL (ref 6.0–8.5)

## 2017-03-30 LAB — PROTEIN C ACTIVITY: Protein C Activity: 51 % — ABNORMAL LOW (ref 73–180)

## 2017-04-02 DIAGNOSIS — M222X2 Patellofemoral disorders, left knee: Secondary | ICD-10-CM | POA: Diagnosis not present

## 2017-04-02 LAB — PROTEIN C, TOTAL: Protein C, Total: 51 % — ABNORMAL LOW (ref 60–150)

## 2017-04-03 ENCOUNTER — Encounter: Payer: Self-pay | Admitting: Hematology and Oncology

## 2017-04-03 ENCOUNTER — Inpatient Hospital Stay (HOSPITAL_BASED_OUTPATIENT_CLINIC_OR_DEPARTMENT_OTHER): Payer: 59 | Admitting: Hematology and Oncology

## 2017-04-03 ENCOUNTER — Other Ambulatory Visit: Payer: Self-pay | Admitting: *Deleted

## 2017-04-03 ENCOUNTER — Other Ambulatory Visit: Payer: Self-pay

## 2017-04-03 ENCOUNTER — Other Ambulatory Visit: Payer: Self-pay | Admitting: Family Medicine

## 2017-04-03 VITALS — BP 108/69 | HR 82 | Temp 96.7°F | Wt 175.6 lb

## 2017-04-03 DIAGNOSIS — D6859 Other primary thrombophilia: Secondary | ICD-10-CM

## 2017-04-03 DIAGNOSIS — Z79899 Other long term (current) drug therapy: Secondary | ICD-10-CM | POA: Diagnosis not present

## 2017-04-03 DIAGNOSIS — D472 Monoclonal gammopathy: Secondary | ICD-10-CM

## 2017-04-03 DIAGNOSIS — Z86718 Personal history of other venous thrombosis and embolism: Secondary | ICD-10-CM

## 2017-04-03 DIAGNOSIS — E559 Vitamin D deficiency, unspecified: Secondary | ICD-10-CM

## 2017-04-03 DIAGNOSIS — Z7901 Long term (current) use of anticoagulants: Secondary | ICD-10-CM | POA: Diagnosis not present

## 2017-04-03 DIAGNOSIS — I824Z9 Acute embolism and thrombosis of unspecified deep veins of unspecified distal lower extremity: Secondary | ICD-10-CM

## 2017-04-03 NOTE — Progress Notes (Signed)
[facility] day:  04/03/2017   Chief Complaint: Tammy Garza is a 36 y.o. female with a DVT of the left lower extremity who is seen for 3 month assessment and review of additional testing.  HPI:  The patient was last seen by me in the hematology clinic on 01/08/2017.  At that time, she was doing well s/p hysterectomy.  She had continued discomfort in her LEFT arm associated with a superficial thrombophlebitis s/p a peripheral IV. Exam revealed no adenopathy or hepatosplenomegaly.  She continued Eliquis.  Labs on 03/29/2017 revealed a hematocrit 38.4, hemoglobin 12.7, platelets 259,000, white count 6100 with an ANC of 3100.  CMP and SPEP were normal.  Protein C total was 51% with an activity of 51%.  During the interim, she has felt "fine".  She is tired today as she just got off third shift.  She recently had a knee injury and had an MRI yesterday (no result available).  No bruising or bleeding on Eliquis.   Past Medical History:  Diagnosis Date  . Anemia    h/o  . Cervical high risk human papillomavirus (HPV) DNA test positive   . Chronic gastritis   . Depression   . DVT (deep venous thrombosis) ([place]) 09/2016   left leg  . H. pylori infection   . History of attempted suicide   . History of gastric polyp   . History of kidney stones    h/o  . Migraines   . Protein C deficiency ([place])   . Rash   . Shift work sleep disorder     Past Surgical History:  Procedure Laterality Date  . CHOLECYSTECTOMY    . CYSTOSCOPY  12/19/2016   Procedure: CYSTOSCOPY;  Surgeon: Gae Dry, MD;  Location: [facility] OR[facility];  Service: Gynecology;;  . LAPAROSCOPIC HYSTERECTOMY Bilateral 12/19/2016   Procedure: HYSTERECTOMY TOTAL LAPAROSCOPIC BILATERAL SALPINGECTOMY;  Surgeon: Gae Dry, MD;  Location: [facility] OR[facility];  Service: Gynecology;  Laterality: Bilateral;  . TUBAL LIGATION      Family History  Problem Relation Age of Onset  . HIV Sister   . Cancer  Sister 79       cervical cancer  . Asthma Daughter   . Hypertension Maternal Grandmother   . Hypertension Mother   . Obesity Mother   . Cancer Father 23       lung cancer  . Deep vein thrombosis Father   . COPD Paternal Grandmother   . Deep vein thrombosis Paternal Grandmother     Social History:  reports that she quit smoking about 4 years ago. Her smoking use included cigarettes. She has a 1.25 pack-year smoking history. she has never used smokeless tobacco. She reports that she drinks about 0.6 oz of alcohol per week. She reports that she does not use drugs.  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  She wears a mask at work.  She has 2 daughters (ages 69 and 62).  The patient is alone today.  Allergies: No Known Allergies  Current Medications: Current Outpatient Medications  Medication Sig Dispense Refill  . baclofen (LIORESAL) 10 MG tablet TAKE 1/2 - 1 TAB TWICE DAILY AS NEEDED **LIMIT TO 1-2 HEADACHE EPISODES PER WEEK. AVOID DAILY USE.  0  . ELIQUIS 5 MG TABS tablet TAKE 1 TABLET BY MOUTH TWICE A DAY 60 tablet 1  . glycopyrrolate (ROBINUL) 1 MG tablet TAKE ONE OR TWO PILLS AS DIRECTED.  2  . polyethylene glycol (MIRALAX / GLYCOLAX) packet Take 17 g by mouth daily. Mix one tablespoon with 8oz of your favorite juice or water every day until you are having soft formed stools. Then start taking once daily if you didn't have a stool the day before. 30 each 0  . promethazine (PHENERGAN) 25 MG tablet Take 0.5-1 tablets (12.5-25 mg total) by mouth every 8 (eight) hours as needed for nausea or vomiting. 20 tablet 0  . Vitamin D, Ergocalciferol, (DRISDOL) 50000 units CAPS capsule TAKE 1 CAPSULE (50,000 UNITS TOTAL) BY MOUTH EVERY 7 (SEVEN) DAYS. 12 capsule 0   No current facility-administered medications for this visit.     Review of Systems:  GENERAL:  Feels "fine".  No fevers.  Weight up 1 pound.  PERFORMANCE STATUS (ECOG):  0 HEENT:  No visual changes,  runny nose, sore throat, mouth sores or tenderness. Lungs: No shortness of breath or cough.  No hemoptysis. Cardiac:  No chest pain, palpitations, orthopnea, or PND. GI:  No nausea, vomiting, constipation, melena or hematochezia. GU:  No urgency, frequency, dysuria, or hematuria.  Planned partial hysterectomy. Musculoskeletal:  No back pain.  S/p knee injury (see HPI).  No muscle tenderness. Extremities:  No pain or swelling. Skin:  No rashes or skin changes. Neuro:  Migraine headaches.  No numbness or weakness, balance or coordination issues. Endocrine:  No diabetes, thyroid issues, hot flashes or night sweats. Psych:  No mood changes, depression or anxiety. Pain:  No focal pain. Review of systems:  All other systems reviewed and found to be negative.  Physical Exam: Blood pressure 108/69, pulse 82, temperature (!) 96.7 F (35.9 C), temperature source Tympanic, weight 175 lb 9.6 oz (79.7 kg), last menstrual period 01/04/2016. GENERAL:  Well developed, well nourished, woman sitting comfortably in the exam room in no acute distress. MENTAL STATUS:  Alert and oriented to person, place and time. HEAD:  Brown hair.  Normocephalic, atraumatic, face symmetric, no Cushingoid features. EYES:  Brown eyes.  Pupils equal round and reactive to light and accomodation.  No conjunctivitis or scleral icterus. ENT:  Oropharynx clear without lesion.  Tongue normal. Mucous membranes moist.  RESPIRATORY:  Clear to auscultation without rales, wheezes or rhonchi. CARDIOVASCULAR:  Regular rate and rhythm without murmur, rub or gallop. ABDOMEN:  Soft, non-tender, with active bowel sounds, and no hepatosplenomegaly.  No masses. SKIN:  No rashes, ulcers or lesions. EXTREMITIES:  No increased warmth left knee.  No edema, no skin discoloration or tenderness.  No palpable cords. LYMPH NODES: No palpable cervical, supraclavicular, axillary or inguinal adenopathy  NEUROLOGICAL: Unremarkable. PSYCH:   Appropriate.   No visits with results within 3 Day(s) from this visit.  Latest known visit with results is:  Appointment on 03/29/2017  Component Date Value Ref Range Status  . Protein C Activity 03/29/2017 51* 73 - 180 % Final   Comment: (NOTE) A deficiency of protein C (PC), either congenital or acquired, increases the risk of thromboembolism. Acquired PC deficiency occurs more frequently than congenital deficiency. PC levels can be transiently diminished after a thrombotic event or surgery or in the presence of certain anticoagulants. Heparin, direct Xa inhibitor, or thrombotic inhibitor therapy does not alter PC levels physiologically and does not interfere with this assay because it is chromogenic and clot-based. Vitamin K antagonist therapy may decrease plasma levels of functional protein C (PC) as PC is a vitamin K- dependent protein. Vitamin K deficiency, due to dietary insufficiency or malabsorption will also lead  to reduced PC levels. Acquired deficiency can be found in individuals with disseminated intravascular coagulation (DIC) and sepsis. Severe hepatic disorders (hepatitis, cirrhosis, etc.), nephrotic syndrome, malignancy and inflammatory bowel disease can lead to diminished PC levels. Drug                           therapy with L-asparaginse or fluorouracil can also reduce PC levels. Levels may be decreased in patients with polycythemia vera, sickle cell disease and essential thrombocythemia. Repeat evaluation on a new plasma sample to confirm or refute this result should be considered, after ruling out acquired causes, depending on the clinical scenario. Performed At: [facility] [place], [place] [id number] Rush Farmer MD DX:[id number]   . Protein C, Total 03/29/2017 51* 60 - 150 % Final   Comment: (NOTE) A deficiency of protein C (PC), either congenital or acquired, increases the risk of thromboembolism. Congenital deficiencies of PC  are very rare; acquired PC deficiency is much more common. PC levels can be transiently diminished after an acute thrombotic event. Oral anticoagulant therapy with warfarin will lower PC levels as well as vitamin K deficiency. Acquired deficiency can also occur in individuals with disseminated intravascular coagulation (DIC), sepsis, severe liver disease, nephrotic syndrome, and in inflammatory bowel disease. Levels may be spuriously decreased in individuals with Factor V Leiden. Drug therapy with L-asparaginse, fluorouracil, methotrexate, cyclophosphamide or tamoxifen can also reduce PC levels. It has been suggested that repeat blood sampling and testing after ruling out acquired causes of deficiency should be performed before the patient is diagnosed with congenital Protein C deficiency. Performed At: [facility] [place], [place] [id number] Rush Farmer MD [id number]   . Total Protein ELP 03/29/2017 7.5  6.0 - 8.5 g/dL Final  . Albumin ELP 03/29/2017 4.1  2.9 - 4.4 g/dL Final  . Alpha-1-Globulin 03/29/2017 0.2  0.0 - 0.4 g/dL Final  . Alpha-2-Globulin 03/29/2017 0.7  0.4 - 1.0 g/dL Final  . Beta Globulin 03/29/2017 1.0  0.7 - 1.3 g/dL Final  . Gamma Globulin 03/29/2017 1.5  0.4 - 1.8 g/dL Final  . M-Spike, % 03/29/2017 Not Observed  Not Observed g/dL Final  . SPE Interp. 03/29/2017 Comment   Final   Comment: (NOTE) The SPE pattern appears essentially unremarkable. Evidence of monoclonal protein is not apparent. Performed At: [facility] [place], [place] [id number] Rush Farmer MD [id number]   . Comment 03/29/2017 Comment   Final   Comment: (NOTE) Protein electrophoresis scan will follow via computer, mail, or courier delivery.   Marland Kitchen GLOBULIN, TOTAL 03/29/2017 3.4  2.2 - 3.9 g/dL Corrected  . A/G Ratio 03/29/2017 1.2  0.7 - 1.7 Corrected  . Sodium 03/29/2017 137  135 - 145 mmol/L Final  .  Potassium 03/29/2017 3.8  3.5 - 5.1 mmol/L Final  . Chloride 03/29/2017 103  101 - 111 mmol/L Final  . CO2 03/29/2017 24  22 - 32 mmol/L Final  . Glucose, Bld 03/29/2017 86  65 - 99 mg/dL Final  . BUN 03/29/2017 11  6 - 20 mg/dL Final  . Creatinine, Ser 03/29/2017 0.72  0.44 - 1.00 mg/dL Final  . Calcium 03/29/2017 9.2  8.9 - 10.3 mg/dL Final  . Total Protein 03/29/2017 8.1  6.5 - 8.1 g/dL Final  .  Albumin 03/29/2017 4.6  3.5 - 5.0 g/dL Final  . AST 03/29/2017 15  15 - 41 U/L Final  . ALT 03/29/2017 14  14 - 54 U/L Final  . Alkaline Phosphatase 03/29/2017 54  38 - 126 U/L Final  . Total Bilirubin 03/29/2017 1.1  0.3 - 1.2 mg/dL Final  . GFR calc non Af Amer 03/29/2017 >60  >60 mL/min Final  . GFR calc Af Amer 03/29/2017 >60  >60 mL/min Final   Comment: (NOTE) The eGFR has been calculated using the CKD EPI equation. This calculation has not been validated in all clinical situations. eGFR's persistently <60 mL/min signify possible Chronic Kidney Disease.   . Anion gap 03/29/2017 10  5 - 15 Final  . WBC 03/29/2017 6.1  3.6 - 11.0 K/uL Final  . RBC 03/29/2017 4.18  3.80 - 5.20 MIL/uL Final  . Hemoglobin 03/29/2017 12.7  12.0 - 16.0 g/dL Final  . HCT 03/29/2017 38.4  35.0 - 47.0 % Final  . MCV 03/29/2017 91.8  80.0 - 100.0 fL Final  . MCH 03/29/2017 30.5  26.0 - 34.0 pg Final  . MCHC 03/29/2017 33.2  32.0 - 36.0 g/dL Final  . RDW 03/29/2017 13.4  11.5 - 14.5 % Final  . Platelets 03/29/2017 259  150 - 440 K/uL Final  . Neutrophils Relative % 03/29/2017 51  % Final  . Neutro Abs 03/29/2017 3.1  1.4 - 6.5 K/uL Final  . Lymphocytes Relative 03/29/2017 36  % Final  . Lymphs Abs 03/29/2017 2.2  1.0 - 3.6 K/uL Final  . Monocytes Relative 03/29/2017 10  % Final  . Monocytes Absolute 03/29/2017 0.6  0.2 - 0.9 K/uL Final  . Eosinophils Relative 03/29/2017 2  % Final  . Eosinophils Absolute 03/29/2017 0.1  0 - 0.7 K/uL Final  . Basophils Relative 03/29/2017 1  % Final  . Basophils Absolute  03/29/2017 0.1  0 - 0.1 K/uL Final    Assessment:  Saron Vanorman is a 36 y.o. female with protein C deficiency and a left lower extremity DVT following a 2 1/2 hour flight.  She had been on DepoProvera x 1 year.  She has a family history of thrombosis.  Left lower extremity duplex on 09/17/2016 revealed an acute appearing thrombus in the calf veins of the left lower extremity (posterior tibial and peroneal veins).  Bilateral lower extremity duplex on 11/20/2016 revealed no evidence of acute DVT within either lower extremity. There was some residual nonocclusive thrombus in the left perineal vein, sequela of previous DVT on the prior scan.  She is on Eliquis.    Hypercoagulable work-up on 09/26/2016 and 11/30/2016 revealed the following negative studies:  Factor V Leiden, prothrombin gene mutation, lupus anticoagulant panel, anticardiolipin antibodies, beta2-glycoprotein, protein S antigen/activity, and ATIII antigen/activity.  She has protein C deficiency.  Labs on 11/30/2016 revealed a protein C total of 40% (60 - 150%) and protein C activity of 49% (73-180%).  Repeat testing on 03/29/2017 revealed a protein C total of 51% and a protein C activity of 51%.  She has a monoclonal gammopathy of unknown significance.  SPEP on 09/26/2016 was negative.  Immunofixation revealed an IgA monoclonal protein with lambda light chain specificity.  Free light chain assay was normal.  IgG was 698 [phone]).  IgA was 182 (87-352).  24 hour UPEP on 10/22/2016 revealed no monoclonal protein.  SPEP on 03/29/2017 was negative.  Chest, abdomen, and pelvic CT on 12/08/2016 revealed no lymphadenopathy or hepatosplenomegaly.  She underwent total laparoscopic  hysterectomy with bilateral salpingectomy on 12/19/2016. She was off Eliquis x 3 days (2 days pre-op and day of surgery).  She developed a small superficial vein thrombus.  Symptomatically, is is feeling well.  She recently injured her knee.  Exam reveals no adenopathy  or hepatosplenomegaly.  Plan: 1.  Review testing from last visit.  Patient has protein C deficiency.  Discuss genetics (autosomal dominant).  Discuss talking to family members and having daughters tested.  Discuss potential issues with use of Coumadin without adequate overlap of heparin/Lovenox (Coumadin skin necrosis).  Discuss life long anticoagulation.  Discuss continuation of Eliquis. 2.  Patient to contact clinic prior to future surgeries. 3.  RTC in 4 months for MD assessment and labs (CBC with diff, CMP).   Honor Loh, NP 04/03/2017, 4:35 PM   I saw and evaluated the patient, participating in the key portions of the service and reviewing pertinent diagnostic studies and records.  I reviewed the nurse practitioner's note and agree with the findings and the plan.  The assessment and plan were discussed with the patient.  Multiple questions were asked by the patient and answered.   Nolon Stalls, MD 04/03/2017, 4:35 PM

## 2017-04-03 NOTE — Telephone Encounter (Signed)
Refill request for general medication. Vitamin D to [facility].  Last office visit: 11/20/2016

## 2017-04-10 DIAGNOSIS — M222X2 Patellofemoral disorders, left knee: Secondary | ICD-10-CM | POA: Diagnosis not present

## 2017-04-10 DIAGNOSIS — M6281 Muscle weakness (generalized): Secondary | ICD-10-CM | POA: Diagnosis not present

## 2017-04-10 DIAGNOSIS — M25562 Pain in left knee: Secondary | ICD-10-CM | POA: Diagnosis not present

## 2017-04-12 DIAGNOSIS — M222X2 Patellofemoral disorders, left knee: Secondary | ICD-10-CM | POA: Diagnosis not present

## 2017-04-18 DIAGNOSIS — S83512D Sprain of anterior cruciate ligament of left knee, subsequent encounter: Secondary | ICD-10-CM | POA: Diagnosis not present

## 2017-04-20 DIAGNOSIS — M6281 Muscle weakness (generalized): Secondary | ICD-10-CM | POA: Diagnosis not present

## 2017-04-20 DIAGNOSIS — M25562 Pain in left knee: Secondary | ICD-10-CM | POA: Diagnosis not present

## 2017-04-25 DIAGNOSIS — M25562 Pain in left knee: Secondary | ICD-10-CM | POA: Diagnosis not present

## 2017-04-25 DIAGNOSIS — M6281 Muscle weakness (generalized): Secondary | ICD-10-CM | POA: Diagnosis not present

## 2017-04-25 DIAGNOSIS — R609 Edema, unspecified: Secondary | ICD-10-CM | POA: Diagnosis not present

## 2017-04-30 DIAGNOSIS — M6281 Muscle weakness (generalized): Secondary | ICD-10-CM | POA: Diagnosis not present

## 2017-04-30 DIAGNOSIS — L74512 Primary focal hyperhidrosis, palms: Secondary | ICD-10-CM | POA: Diagnosis not present

## 2017-04-30 DIAGNOSIS — M25562 Pain in left knee: Secondary | ICD-10-CM | POA: Diagnosis not present

## 2017-04-30 DIAGNOSIS — R609 Edema, unspecified: Secondary | ICD-10-CM | POA: Diagnosis not present

## 2017-05-04 DIAGNOSIS — R609 Edema, unspecified: Secondary | ICD-10-CM | POA: Diagnosis not present

## 2017-05-04 DIAGNOSIS — M25562 Pain in left knee: Secondary | ICD-10-CM | POA: Diagnosis not present

## 2017-05-04 DIAGNOSIS — M6281 Muscle weakness (generalized): Secondary | ICD-10-CM | POA: Diagnosis not present

## 2017-05-09 DIAGNOSIS — R609 Edema, unspecified: Secondary | ICD-10-CM | POA: Diagnosis not present

## 2017-05-09 DIAGNOSIS — M25562 Pain in left knee: Secondary | ICD-10-CM | POA: Diagnosis not present

## 2017-05-09 DIAGNOSIS — M6281 Muscle weakness (generalized): Secondary | ICD-10-CM | POA: Diagnosis not present

## 2017-05-10 DIAGNOSIS — G43019 Migraine without aura, intractable, without status migrainosus: Secondary | ICD-10-CM | POA: Diagnosis not present

## 2017-05-10 DIAGNOSIS — G43719 Chronic migraine without aura, intractable, without status migrainosus: Secondary | ICD-10-CM | POA: Diagnosis not present

## 2017-05-12 ENCOUNTER — Other Ambulatory Visit: Payer: Self-pay | Admitting: Hematology and Oncology

## 2017-05-17 ENCOUNTER — Ambulatory Visit: Payer: 59 | Admitting: Family Medicine

## 2017-05-17 ENCOUNTER — Encounter: Payer: Self-pay | Admitting: Hematology and Oncology

## 2017-05-17 ENCOUNTER — Encounter: Payer: Self-pay | Admitting: Family Medicine

## 2017-05-17 VITALS — BP 102/64 | HR 87 | Temp 98.5°F | Resp 16 | Ht 66.0 in | Wt 188.9 lb

## 2017-05-17 DIAGNOSIS — Z01818 Encounter for other preprocedural examination: Secondary | ICD-10-CM | POA: Diagnosis not present

## 2017-05-17 DIAGNOSIS — R609 Edema, unspecified: Secondary | ICD-10-CM | POA: Diagnosis not present

## 2017-05-17 DIAGNOSIS — D6859 Other primary thrombophilia: Secondary | ICD-10-CM | POA: Diagnosis not present

## 2017-05-17 DIAGNOSIS — M6281 Muscle weakness (generalized): Secondary | ICD-10-CM | POA: Diagnosis not present

## 2017-05-17 DIAGNOSIS — M25562 Pain in left knee: Secondary | ICD-10-CM | POA: Diagnosis not present

## 2017-05-17 LAB — POCT GLYCOSYLATED HEMOGLOBIN (HGB A1C): Hemoglobin A1C: 4.8

## 2017-05-17 NOTE — Progress Notes (Signed)
Name: Tammy Garza   MRN: [id number]    DOB: [birth year]   Date:05/18/2017       Progress Note  Subjective  Chief Complaint  Chief Complaint  Patient presents with  . Knee Pain    Onset-October, Left Knee ACL is completely gone. Surgery scheduled for May 25, 2016 with Dr. Harlow Mares    HPI  ACL left injury: she went to Trampoline park with her daughters back in October 2018, she heard a pop immediately followed by swelling and difficulty bearing weight. She was initially diagnosed with runner's knee and had PT, she was getting better, pain had resolved, but went dancing with boyfriend and heard two more pops, had a MRI and diagnosed with ACL tear and a tibia plateau fracture. She is seeing Dr. Harlow Mares and scheduled for ACL repair 05/25/2016. She had surgery in the past without any anesthesia complication but had post-surgical thrombus formation on her arm. No SOB or recent URI.   Protein C deficiency: sees Dr. Mike Gip and is on long term Eliquis since diagnosed with DVT back in May 2018. She cannot be without anti-coagulation during surgery and we will request her assistance in managing this during her peri-operative period.   Patient Active Problem List   Diagnosis Date Noted  . Vagina bleeding 02/27/2017  . Protein C deficiency ([place]) 12/10/2016  . Monoclonal gammopathy of unknown significance (MGUS) 11/30/2016  . Fibroid 11/22/2016  . Menorrhagia with regular cycle 11/02/2016  . Menstrual migraine without status migrainosus, not intractable 11/02/2016  . History of DVT (deep vein thrombosis) 11/02/2016  . Elevated blood protein 09/26/2016  . Acute deep vein thrombosis (DVT) of distal lower extremity ([place]) 09/17/2016  . Vitamin D deficiency 07/26/2015  . Thyroid cyst 07/26/2015  . Pap smear of cervix shows high risk HPV present 04/22/2015  . History of Helicobacter pylori infection 04/22/2015  . Migraine with aura and without status migrainosus 04/22/2015  . Shift work sleep disorder  04/22/2015    Past Surgical History:  Procedure Laterality Date  . CHOLECYSTECTOMY    . CYSTOSCOPY  12/19/2016   Procedure: CYSTOSCOPY;  Surgeon: Gae Dry, MD;  Location: [facility] ORS;  Service: Gynecology;;  . LAPAROSCOPIC HYSTERECTOMY Bilateral 12/19/2016   Procedure: HYSTERECTOMY TOTAL LAPAROSCOPIC BILATERAL SALPINGECTOMY;  Surgeon: Gae Dry, MD;  Location: [facility] ORS;  Service: Gynecology;  Laterality: Bilateral;  . TUBAL LIGATION      Family History  Problem Relation Age of Onset  . HIV Sister   . Cancer Sister 8       cervical cancer  . Asthma Daughter   . Hypertension Maternal Grandmother   . Hypertension Mother   . Obesity Mother   . Cancer Father 26       lung cancer  . Deep vein thrombosis Father   . COPD Paternal Grandmother   . Deep vein thrombosis Paternal Grandmother     Social History   Socioeconomic History  . Marital status: Divorced    Spouse name: Not on file  . Number of children: Not on file  . Years of education: Not on file  . Highest education level: Not on file  Social Needs  . Financial resource strain: Not on file  . Food insecurity - worry: Not on file  . Food insecurity - inability: Not on file  . Transportation needs - medical: Not on file  . Transportation needs - non-medical: Not on file  Occupational History  . Not on file  Tobacco Use  . Smoking  status: Former Smoker    Packs/day: 0.25    Years: 5.00    Pack years: 1.25    Types: Cigarettes    Start date: 05/15/2002    Last attempt to quit: 05/16/2007    Years since quitting: 10.0  . Smokeless tobacco: Never Used  Substance and Sexual Activity  . Alcohol use: Yes    Alcohol/week: 0.6 oz    Types: 1 Glasses of wine per week    Comment: occ wine  . Drug use: No  . Sexual activity: Yes    Partners: Male    Birth control/protection: Surgical    Comment: Tubal Ligation  Other Topics Concern  . Not on file  Social History Narrative   Separated since June 2016 - she has  two children at home . The youngest is his daughter. Working at [employer] in [place]. She works at [employer]. She had been laid off by back since 2017.    Dating Gwyndolyn Saxon since 2015     Current Outpatient Medications:  .  chlorproMAZINE (THORAZINE) 25 MG tablet, TAKE 1-2TABS AS NEEDED FOR HEADACHE RESCUE MAY REPEAT IN 2-3HOURS MAX 4 TABS/24 HOURS, Disp: , Rfl: 1 .  ELIQUIS 5 MG TABS tablet, TAKE 1 TABLET BY MOUTH TWICE A DAY, Disp: 60 tablet, Rfl: 1 .  gabapentin (NEURONTIN) 100 MG capsule, Take 300 mg by mouth daily., Disp: , Rfl: 2 .  glycopyrrolate (ROBINUL) 1 MG tablet, Take 3 mg by mouth daily., Disp: , Rfl: 2 .  Vitamin D, Ergocalciferol, (DRISDOL) 50000 units CAPS capsule, TAKE 1 CAPSULE (50,000 UNITS TOTAL) BY MOUTH EVERY 7 (SEVEN) DAYS., Disp: 12 capsule, Rfl: 0  No Known Allergies   ROS  Constitutional: Negative for fever or weight change.  Respiratory: Negative for cough and shortness of breath.   Cardiovascular: Negative for chest pain or palpitations.  Gastrointestinal: Negative for abdominal pain, no bowel changes.  Musculoskeletal:Positive for gait problem and left  joint swelling.  Skin: Negative for rash.  Neurological: Negative for dizziness or headache.  No other specific complaints in a complete review of systems (except as listed in HPI above).  Objective  Vitals:   05/17/17 1115  BP: 102/64  Pulse: 87  Resp: 16  Temp: 98.5 F (36.9 C)  TempSrc: Oral  SpO2: 97%  Weight: 188 lb 14.4 oz (85.7 kg)  Height: _0  (1.676 m)    Body mass index is 30.49 kg/m.  Physical Exam  Constitutional: Patient appears well-developed and well-nourished. Obese  No distress.  HEENT: head atraumatic, normocephalic, pupils equal and reactive to light, neck supple, throat within normal limits Cardiovascular: Normal rate, regular rhythm and normal heart sounds.  No murmur heard. No BLE edema. Pulmonary/Chest: Effort normal and breath sounds normal. No respiratory  distress. Abdominal: Soft.  There is no tenderness. Psychiatric: Patient has a normal mood and affect. behavior is normal. Judgment and thought content normal. Muscular Skeletal: mild effusion, mild crepitus with extension   Recent Results (from the past 2160 hour(s))  Protein C activity     Status: Abnormal   Collection Time: 03/29/17 10:25 AM  Result Value Ref Range   Protein C Activity 51 (L) 73 - 180 %    Comment: (NOTE) A deficiency of protein C (PC), either congenital or acquired, increases the risk of thromboembolism. Acquired PC deficiency occurs more frequently than congenital deficiency. PC levels can be transiently diminished after a thrombotic event or surgery or in the presence of certain anticoagulants. Heparin, direct Xa  inhibitor, or thrombotic inhibitor therapy does not alter PC levels physiologically and does not interfere with this assay because it is chromogenic and clot-based. Vitamin K antagonist therapy may decrease plasma levels of functional protein C (PC) as PC is a vitamin K- dependent protein. Vitamin K deficiency, due to dietary insufficiency or malabsorption will also lead to reduced PC levels. Acquired deficiency can be found in individuals with disseminated intravascular coagulation (DIC) and sepsis. Severe hepatic disorders (hepatitis, cirrhosis, etc.), nephrotic syndrome, malignancy and inflammatory bowel disease can lead to diminished PC levels. Drug  therapy with L-asparaginse or fluorouracil can also reduce PC levels. Levels may be decreased in patients with polycythemia vera, sickle cell disease and essential thrombocythemia. Repeat evaluation on a new plasma sample to confirm or refute this result should be considered, after ruling out acquired causes, depending on the clinical scenario. Performed At: [facility] [place], [place] [id number] Rush Farmer MD MW:[id number]   Protein C, total     Status: Abnormal    Collection Time: 03/29/17 10:25 AM  Result Value Ref Range   Protein C, Total 51 (L) 60 - 150 %    Comment: (NOTE) A deficiency of protein C (PC), either congenital or acquired, increases the risk of thromboembolism. Congenital deficiencies of PC are very rare; acquired PC deficiency is much more common. PC levels can be transiently diminished after an acute thrombotic event. Oral anticoagulant therapy with warfarin will lower PC levels as well as vitamin K deficiency. Acquired deficiency can also occur in individuals with disseminated intravascular coagulation (DIC), sepsis, severe liver disease, nephrotic syndrome, and in inflammatory bowel disease. Levels may be spuriously decreased in individuals with Factor V Leiden. Drug therapy with L-asparaginse, fluorouracil, methotrexate, cyclophosphamide or tamoxifen can also reduce PC levels. It has been suggested that repeat blood sampling and testing after ruling out acquired causes of deficiency should be performed before the patient is diagnosed with congenital Protein C deficiency. Performed At: [facility] [place], [place] [id number] Rush Farmer MD HK:[id number]   Protein electrophoresis, serum     Status: None   Collection Time: 03/29/17 10:25 AM  Result Value Ref Range   Total Protein ELP 7.5 6.0 - 8.5 g/dL   Albumin ELP 4.1 2.9 - 4.4 g/dL   Alpha-1-Globulin 0.2 0.0 - 0.4 g/dL   Alpha-2-Globulin 0.7 0.4 - 1.0 g/dL   Beta Globulin 1.0 0.7 - 1.3 g/dL   Gamma Globulin 1.5 0.4 - 1.8 g/dL   M-Spike, % Not Observed Not Observed g/dL   SPE Interp. Comment     Comment: (NOTE) The SPE pattern appears essentially unremarkable. Evidence of monoclonal protein is not apparent. Performed At: [facility] [place], [place] [id number] Rush Farmer MD OA:[id number]    Comment Comment     Comment: (NOTE) Protein electrophoresis scan will follow via computer, mail, or courier  delivery.    GLOBULIN, TOTAL 3.4 2.2 - 3.9 g/dL   A/G Ratio 1.2 0.7 - 1.7  Comprehensive metabolic panel     Status: None   Collection Time: 03/29/17 10:25 AM  Result Value Ref Range   Sodium 137 135 - 145 mmol/L   Potassium 3.8 3.5 - 5.1 mmol/L   Chloride 103 101 - 111 mmol/L   CO2 24 22 - 32 mmol/L   Glucose, Bld 86 65 - 99 mg/dL   BUN 11 6 - 20 mg/dL   Creatinine, Ser 0.72 0.44 - 1.00 mg/dL   Calcium 9.2  8.9 - 10.3 mg/dL   Total Protein 8.1 6.5 - 8.1 g/dL   Albumin 4.6 3.5 - 5.0 g/dL   AST 15 15 - 41 U/L   ALT 14 14 - 54 U/L   Alkaline Phosphatase 54 38 - 126 U/L   Total Bilirubin 1.1 0.3 - 1.2 mg/dL   GFR calc non Af Amer >60 >60 mL/min   GFR calc Af Amer >60 >60 mL/min    Comment: (NOTE) The eGFR has been calculated using the CKD EPI equation. This calculation has not been validated in all clinical situations. eGFR's persistently <60 mL/min signify possible Chronic Kidney Disease.    Anion gap 10 5 - 15  CBC with Differential/Platelet     Status: None   Collection Time: 03/29/17 10:25 AM  Result Value Ref Range   WBC 6.1 3.6 - 11.0 K/uL   RBC 4.18 3.80 - 5.20 MIL/uL   Hemoglobin 12.7 12.0 - 16.0 g/dL   HCT 38.4 35.0 - 47.0 %   MCV 91.8 80.0 - 100.0 fL   MCH 30.5 26.0 - 34.0 pg   MCHC 33.2 32.0 - 36.0 g/dL   RDW 13.4 11.5 - 14.5 %   Platelets 259 150 - 440 K/uL   Neutrophils Relative % 51 %   Neutro Abs 3.1 1.4 - 6.5 K/uL   Lymphocytes Relative 36 %   Lymphs Abs 2.2 1.0 - 3.6 K/uL   Monocytes Relative 10 %   Monocytes Absolute 0.6 0.2 - 0.9 K/uL   Eosinophils Relative 2 %   Eosinophils Absolute 0.1 0 - 0.7 K/uL   Basophils Relative 1 %   Basophils Absolute 0.1 0 - 0.1 K/uL  POCT HgB A1C     Status: Normal   Collection Time: 05/17/17 12:04 PM  Result Value Ref Range   Hemoglobin A1C 4.8       PHQ2/9: Depression screen [facility] 2/9 05/17/2017 11/20/2016 08/22/2016 06/14/2016 02/22/2016  Decreased Interest 0 0 0 0 0  Down, Depressed, Hopeless 0 0 0 0 0  PHQ - 2  Score 0 0 0 0 0    Fall Risk: Fall Risk  05/17/2017 11/20/2016 08/22/2016 06/14/2016 02/22/2016  Falls in the past year? _0     Functional Status Survey: Is the patient deaf or have difficulty hearing?: No Does the patient have difficulty seeing, even when wearing glasses/contacts?: No Does the patient have difficulty concentrating, remembering, or making decisions?: No Does the patient have difficulty walking or climbing stairs?: Yes(Left Knee Pain) Does the patient have difficulty dressing or bathing?: No Does the patient have difficulty doing errands alone such as visiting a doctor's office or shopping?: No    Assessment & Plan  1. Protein C deficiency ([place])  Needs recommendation from Dr. Mike Gip, explained it may be best to have surgery at [facility]. Contacted Dr. Kem Parkinson office and we will send notes for her review.    2. Pre-operative clearance  - POCT HgB A1C - EKG 12-Lead - normal  May proceed to surgery after evaluation by Dr. Mike Gip to manage the peri-operative period with anti-coagulation regiment

## 2017-05-22 ENCOUNTER — Telehealth: Payer: Self-pay

## 2017-05-22 NOTE — Telephone Encounter (Signed)
Copied from [place] [id number]. Topic: Inquiry >> May 22, 2017  8:25 AM Ether Griffins B wrote: Reason for CRM: pt is suppose to have knee surgery on Friday and [facility] was suppose to be sending over medical clearance. Pt calling to see if the office has received that

## 2017-05-25 DIAGNOSIS — M25562 Pain in left knee: Secondary | ICD-10-CM | POA: Diagnosis not present

## 2017-05-25 DIAGNOSIS — S83282A Other tear of lateral meniscus, current injury, left knee, initial encounter: Secondary | ICD-10-CM | POA: Diagnosis not present

## 2017-05-25 DIAGNOSIS — S83512A Sprain of anterior cruciate ligament of left knee, initial encounter: Secondary | ICD-10-CM | POA: Diagnosis not present

## 2017-05-25 DIAGNOSIS — S83262A Peripheral tear of lateral meniscus, current injury, left knee, initial encounter: Secondary | ICD-10-CM | POA: Diagnosis not present

## 2017-05-25 DIAGNOSIS — M23612 Other spontaneous disruption of anterior cruciate ligament of left knee: Secondary | ICD-10-CM | POA: Diagnosis not present

## 2017-05-25 DIAGNOSIS — D6859 Other primary thrombophilia: Secondary | ICD-10-CM | POA: Diagnosis not present

## 2017-05-25 DIAGNOSIS — G8918 Other acute postprocedural pain: Secondary | ICD-10-CM | POA: Diagnosis not present

## 2017-05-28 ENCOUNTER — Encounter: Payer: Self-pay | Admitting: Hematology and Oncology

## 2017-06-10 ENCOUNTER — Other Ambulatory Visit: Payer: Self-pay | Admitting: Hematology and Oncology

## 2017-06-13 DIAGNOSIS — R609 Edema, unspecified: Secondary | ICD-10-CM | POA: Diagnosis not present

## 2017-06-13 DIAGNOSIS — M25562 Pain in left knee: Secondary | ICD-10-CM | POA: Diagnosis not present

## 2017-06-13 DIAGNOSIS — M6281 Muscle weakness (generalized): Secondary | ICD-10-CM | POA: Diagnosis not present

## 2017-06-15 DIAGNOSIS — M25562 Pain in left knee: Secondary | ICD-10-CM | POA: Diagnosis not present

## 2017-06-15 DIAGNOSIS — M6281 Muscle weakness (generalized): Secondary | ICD-10-CM | POA: Diagnosis not present

## 2017-06-20 DIAGNOSIS — M25562 Pain in left knee: Secondary | ICD-10-CM | POA: Diagnosis not present

## 2017-06-20 DIAGNOSIS — M6281 Muscle weakness (generalized): Secondary | ICD-10-CM | POA: Diagnosis not present

## 2017-06-20 DIAGNOSIS — R609 Edema, unspecified: Secondary | ICD-10-CM | POA: Diagnosis not present

## 2017-06-27 ENCOUNTER — Other Ambulatory Visit: Payer: Self-pay | Admitting: Family Medicine

## 2017-06-27 DIAGNOSIS — M25562 Pain in left knee: Secondary | ICD-10-CM | POA: Diagnosis not present

## 2017-06-27 DIAGNOSIS — M6281 Muscle weakness (generalized): Secondary | ICD-10-CM | POA: Diagnosis not present

## 2017-06-27 DIAGNOSIS — R609 Edema, unspecified: Secondary | ICD-10-CM | POA: Diagnosis not present

## 2017-06-27 DIAGNOSIS — E559 Vitamin D deficiency, unspecified: Secondary | ICD-10-CM

## 2017-06-27 NOTE — Telephone Encounter (Signed)
Refill request for general medication. Vitamin D to [facility].   Last office visit: 05/17/2017   Follow up on 08/23/2017

## 2017-06-29 DIAGNOSIS — M6281 Muscle weakness (generalized): Secondary | ICD-10-CM | POA: Diagnosis not present

## 2017-06-29 DIAGNOSIS — R609 Edema, unspecified: Secondary | ICD-10-CM | POA: Diagnosis not present

## 2017-06-29 DIAGNOSIS — M25562 Pain in left knee: Secondary | ICD-10-CM | POA: Diagnosis not present

## 2017-07-03 DIAGNOSIS — M6281 Muscle weakness (generalized): Secondary | ICD-10-CM | POA: Diagnosis not present

## 2017-07-03 DIAGNOSIS — M25562 Pain in left knee: Secondary | ICD-10-CM | POA: Diagnosis not present

## 2017-07-03 DIAGNOSIS — R609 Edema, unspecified: Secondary | ICD-10-CM | POA: Diagnosis not present

## 2017-07-05 ENCOUNTER — Ambulatory Visit
Admission: RE | Admit: 2017-07-05 | Discharge: 2017-07-05 | Disposition: A | Discharge status: Home / Self Care | Payer: 59 | Source: Ambulatory Visit | Attending: Orthopedic Surgery | Admitting: Orthopedic Surgery

## 2017-07-05 ENCOUNTER — Telehealth: Payer: Self-pay

## 2017-07-05 ENCOUNTER — Other Ambulatory Visit: Payer: Self-pay | Admitting: Orthopedic Surgery

## 2017-07-05 ENCOUNTER — Telehealth: Payer: Self-pay | Admitting: *Deleted

## 2017-07-05 DIAGNOSIS — M25562 Pain in left knee: Secondary | ICD-10-CM | POA: Insufficient documentation

## 2017-07-05 DIAGNOSIS — M7989 Other specified soft tissue disorders: Secondary | ICD-10-CM

## 2017-07-05 DIAGNOSIS — S83512A Sprain of anterior cruciate ligament of left knee, initial encounter: Secondary | ICD-10-CM | POA: Insufficient documentation

## 2017-07-05 DIAGNOSIS — X58XXXA Exposure to other specified factors, initial encounter: Secondary | ICD-10-CM | POA: Diagnosis not present

## 2017-07-05 DIAGNOSIS — I82890 Acute embolism and thrombosis of other specified veins: Secondary | ICD-10-CM | POA: Diagnosis not present

## 2017-07-05 DIAGNOSIS — Z9889 Other specified postprocedural states: Secondary | ICD-10-CM | POA: Diagnosis not present

## 2017-07-05 DIAGNOSIS — I824Z2 Acute embolism and thrombosis of unspecified deep veins of left distal lower extremity: Secondary | ICD-10-CM | POA: Diagnosis not present

## 2017-07-05 NOTE — Telephone Encounter (Signed)
Dr. Mike Gip would like to see her in clinic. Can we have her added to today or tomorrow's schedule?

## 2017-07-05 NOTE — Telephone Encounter (Signed)
[facility] called confirming patient has a DVT and needing to speak with Dr. Ancil Boozer. Dr. Ancil Boozer spoke with Tammy Garza and states she is already on a blood thinner and is being evaluated by Dr. Nolon Stalls,  MD Hematology and Oncology. [facility] will need to call her and consult with her regarding medication therapy.

## 2017-07-05 NOTE — Telephone Encounter (Signed)
Per Verdis Frederickson, clot in left peronel vein only.  In 5-18 patient had clots in all 4 vessels.  The one showing today is in the same area.

## 2017-07-05 NOTE — Telephone Encounter (Signed)
Patient seen in hteir office complaining of calf pain and they ordered doppler of LLE and she has a DVT and is already on Eliquis. They are asking what needs to be done now. Per Gaspar Bidding, NP, they can discharge patient and we will call her once discussed with Dr Mike Gip

## 2017-07-06 ENCOUNTER — Inpatient Hospital Stay: Payer: 59 | Attending: Hematology and Oncology | Admitting: Hematology and Oncology

## 2017-07-06 ENCOUNTER — Encounter: Payer: Self-pay | Admitting: Hematology and Oncology

## 2017-07-06 ENCOUNTER — Other Ambulatory Visit: Payer: Self-pay

## 2017-07-06 VITALS — BP 106/71 | HR 80 | Temp 96.6°F | Resp 18 | Wt 176.3 lb

## 2017-07-06 DIAGNOSIS — Z86718 Personal history of other venous thrombosis and embolism: Secondary | ICD-10-CM

## 2017-07-06 DIAGNOSIS — D472 Monoclonal gammopathy: Secondary | ICD-10-CM | POA: Diagnosis not present

## 2017-07-06 DIAGNOSIS — Z7901 Long term (current) use of anticoagulants: Secondary | ICD-10-CM | POA: Insufficient documentation

## 2017-07-06 DIAGNOSIS — Z915 Personal history of self-harm: Secondary | ICD-10-CM | POA: Insufficient documentation

## 2017-07-06 DIAGNOSIS — Z87891 Personal history of nicotine dependence: Secondary | ICD-10-CM | POA: Diagnosis not present

## 2017-07-06 DIAGNOSIS — F329 Major depressive disorder, single episode, unspecified: Secondary | ICD-10-CM | POA: Diagnosis not present

## 2017-07-06 DIAGNOSIS — Z8249 Family history of ischemic heart disease and other diseases of the circulatory system: Secondary | ICD-10-CM | POA: Insufficient documentation

## 2017-07-06 DIAGNOSIS — Z79899 Other long term (current) drug therapy: Secondary | ICD-10-CM | POA: Diagnosis not present

## 2017-07-06 DIAGNOSIS — D6859 Other primary thrombophilia: Secondary | ICD-10-CM

## 2017-07-06 DIAGNOSIS — Z87442 Personal history of urinary calculi: Secondary | ICD-10-CM | POA: Diagnosis not present

## 2017-07-06 DIAGNOSIS — I825Z2 Chronic embolism and thrombosis of unspecified deep veins of left distal lower extremity: Secondary | ICD-10-CM

## 2017-07-06 DIAGNOSIS — I82492 Acute embolism and thrombosis of other specified deep vein of left lower extremity: Secondary | ICD-10-CM | POA: Diagnosis not present

## 2017-07-06 NOTE — Progress Notes (Signed)
Here for follow up. S/p ACL Left leg surg Jan 11/19 -off work and recuperating at home.  Stated doing well.

## 2017-07-06 NOTE — Progress Notes (Signed)
[facility] day:  07/06/2017   Chief Complaint: Tammy Garza is a 37 y.o. female with a DVT of the left lower extremity and protein C deficiency on Eliquis who is seen for sick call visit.  HPI:  The patient was last seen in the hematology clinic on 04/03/2017.  At that time, she was feeling well.  She had recently injured her knee.  She was to contact the clinic for management of her anticoagulation prior to any planned surgery.  She underwent surgery (ACL reconstruction with autograft)  on 05/25/2017 by Dr. Harlow Mares at [facility] in [place].   She presented to [facility] with LEFT leg pain and swelling. With plantarflexion and dorsiflexion of her foot, patient notes that she felt an intense "pulling" in her calf. She denies increased warmth.   Left lower extremity duplex on 07/05/2017 revealed an isolated thrombus of the left peroneal vein in the calf.  Radiology was contacted given her prior history of LLE DVT.  As compared to 11/20/2016, the thrombus within the left peroneal vein appeared similar.  There was no acute thrombus.  Patient restarted Eliquis on 05/25/2017 following her surgery. She is participating in physical therapy. Patient was seen in follow up with orthopedics yesterday. Patient was advised that she no longer needed to wear her brace. Patient denies pain in the clinic today. She continues on apixaban twice a day.   Patient eating well. Her weight has decreased by 12 pounds since 05/17/2017. Patient has generalized tenderness in her LEFT lower extremity that she rates 2/10.   Past Medical History:  Diagnosis Date  . Anemia    h/o  . Cervical high risk human papillomavirus (HPV) DNA test positive   . Chronic gastritis   . Depression   . DVT (deep venous thrombosis) ([place]) 09/2016   left leg  . H. pylori infection   . History of attempted suicide   . History of gastric polyp   . History of kidney stones    h/o  .  Migraines   . Protein C deficiency ([place])   . Rash   . Shift work sleep disorder     Past Surgical History:  Procedure Laterality Date  . CHOLECYSTECTOMY    . CYSTOSCOPY  12/19/2016   Procedure: CYSTOSCOPY;  Surgeon: Gae Dry, MD;  Location: [facility] ORS;  Service: Gynecology;;  . LAPAROSCOPIC HYSTERECTOMY Bilateral 12/19/2016   Procedure: HYSTERECTOMY TOTAL LAPAROSCOPIC BILATERAL SALPINGECTOMY;  Surgeon: Gae Dry, MD;  Location: [facility] ORS;  Service: Gynecology;  Laterality: Bilateral;  . TUBAL LIGATION      Family History  Problem Relation Age of Onset  . HIV Sister   . Cancer Sister 61       cervical cancer  . Asthma Daughter   . Hypertension Maternal Grandmother   . Hypertension Mother   . Obesity Mother   . Cancer Father 5       lung cancer  . Deep vein thrombosis Father   . COPD Paternal Grandmother   . Deep vein thrombosis Paternal Grandmother     Social History:  reports that she quit smoking about 10 years ago. Her smoking use included cigarettes. She started smoking about 15 years ago. She has a 1.25 pack-year smoking history. she has never used smokeless tobacco. She reports that she drinks about 0.6 oz of alcohol per week. She reports that she does not use drugs.  She works in the [employer], [employer].  She is  exposed to alcohol and ammonia.  She wears a mask at work.  She has 2 daughters (ages 54 and 53).  The patient is alone today.  Allergies: No Known Allergies  Current Medications: Current Outpatient Medications  Medication Sig Dispense Refill  . ELIQUIS 5 MG TABS tablet TAKE 1 TABLET BY MOUTH TWICE A DAY 60 tablet 1  . gabapentin (NEURONTIN) 100 MG capsule Take 300 mg by mouth daily.  2  . glycopyrrolate (ROBINUL) 1 MG tablet Take 3 mg by mouth daily.  2  . NOVOFINE PLUS 32G X 4 MM MISC See admin instructions.  1  . SAXENDA 18 MG/3ML SOPN INJECT 3 MG INTO THE SKIN DAILY.  3  . Vitamin D, Ergocalciferol, (DRISDOL) 50000 units CAPS capsule TAKE 1 CAPSULE  (50,000 UNITS TOTAL) BY MOUTH EVERY 7 (SEVEN) DAYS. 12 capsule 0  . chlorproMAZINE (THORAZINE) 25 MG tablet TAKE 1-2TABS AS NEEDED FOR HEADACHE RESCUE MAY REPEAT IN 2-3HOURS MAX 4 TABS/24 HOURS  1   No current facility-administered medications for this visit.     Review of Systems:  GENERAL:  Feels "ok".  No fevers.  Weight down 12 pounds.  PERFORMANCE STATUS (ECOG):  0 HEENT:  No visual changes, runny nose, sore throat, mouth sores or tenderness. Lungs: No shortness of breath or cough.  No hemoptysis. Cardiac:  No chest pain, palpitations, orthopnea, or PND. GI:  No nausea, vomiting, constipation, melena or hematochezia. GU:  No urgency, frequency, dysuria, or hematuria.  Planned partial hysterectomy. Musculoskeletal:  No back pain.  s/p knee sugery.  Left calf pain described as cramping/Charlie horse/pulling sensation (see HPI). Extremities:  No pain or swelling. Skin:  No rashes or skin changes. Neuro:  Migraine headaches.  No numbness or weakness, balance or coordination issues. Endocrine:  No diabetes, thyroid issues, hot flashes or night sweats. Psych:  No mood changes, depression or anxiety. Pain:  2/10 - LEFT lower leg Review of systems:  All other systems reviewed and found to be negative.  Physical Exam: Blood pressure 106/71, pulse 80, temperature (!) 96.6 F (35.9 C), temperature source Tympanic, resp. rate 18, weight 176 lb 4.8 oz (80 kg), last menstrual period 01/04/2016. GENERAL:  Well developed, well nourished, woman sitting comfortably in the exam room in no acute distress. MENTAL STATUS:  Alert and oriented to person, place and time. HEAD:  Short dark hair.  Normocephalic, atraumatic, face symmetric, no Cushingoid features. EYES:  Brown eyes.  Pupils equal round and reactive to light and accomodation.  No conjunctivitis or scleral icterus. ENT:  Oropharynx clear without lesion.  Tongue normal. Mucous membranes moist.  RESPIRATORY:  Clear to auscultation without rales,  wheezes or rhonchi. CARDIOVASCULAR:  Regular rate and rhythm without murmur, rub or gallop. ABDOMEN:  Soft, non-tender, with active bowel sounds, and no hepatosplenomegaly.  No masses. SKIN:  No rashes, ulcers or lesions. EXTREMITIES:  No increased warmth left knee.  No edema, no skin discoloration or tenderness.  No palpable cords. LYMPH NODES: No palpable cervical, supraclavicular, axillary or inguinal adenopathy  NEUROLOGICAL: Unremarkable. PSYCH:  Appropriate.   No visits with results within 3 Day(s) from this visit.  Latest known visit with results is:  Office Visit on 05/17/2017  Component Date Value Ref Range Status  . Hemoglobin A1C 05/17/2017 4.8   Final    Assessment:  Tammy Garza is a 37 y.o. female with protein C deficiency and a left lower extremity DVT following a 2 1/2 hour flight.  She had been on DepoProvera  x 1 year.  She has a family history of thrombosis.  Left lower extremity duplex on 09/17/2016 revealed an acute appearing thrombus in the calf veins of the left lower extremity (posterior tibial and peroneal veins).  Bilateral lower extremity duplex on 11/20/2016 revealed no evidence of acute DVT within either lower extremity. There was some residual nonocclusive thrombus in the left peroneal vein, sequela of previous DVT on the prior scan.  Left lower extremity duplex on 07/05/2017 revealed an isolated thrombus of the left peroneal vein in the calf similar to 11/20/2016.  There was no acute thrombus.  She is on Eliquis.    Hypercoagulable work-up on 09/26/2016 and 11/30/2016 revealed the following negative studies:  Factor V Leiden, prothrombin gene mutation, lupus anticoagulant panel, anticardiolipin antibodies, beta2-glycoprotein, protein S antigen/activity, and ATIII antigen/activity.  She has protein C deficiency.  Labs on 11/30/2016 revealed a protein C total of 40% (60 - 150%) and protein C activity of 49% (73-180%).  Repeat testing on 03/29/2017 revealed a protein  C total of 51% and a protein C activity of 51%.  She has a monoclonal gammopathy of unknown significance.  SPEP on 09/26/2016 was negative.  Immunofixation revealed an IgA monoclonal protein with lambda light chain specificity.  Free light chain assay was normal.  IgG was 698 ([phone]).  IgA was 182 (87-352).  24 hour UPEP on 10/22/2016 revealed no monoclonal protein.  SPEP on 03/29/2017 was negative.  Chest, abdomen, and pelvic CT on 12/08/2016 revealed no lymphadenopathy or hepatosplenomegaly.  She underwent total laparoscopic hysterectomy with bilateral salpingectomy on 12/19/2016. She was off Eliquis x 3 days (2 days pre-op and day of surgery).  She developed a small superficial vein thrombus.  She underwent surgery (ACL reconstruction with autograft)  on 05/25/2017.  Left lower extremity duplex on 07/05/2017 revealed an isolated thrombus of the left peroneal vein in the calf.  As compared to 11/20/2016, the thrombus within the left peroneal vein appeared similar.  There was no acute thrombus.  Symptomatically, she was feeling well.  She recently injured her knee.  Exam reveals no adenopathy or hepatosplenomegaly.  Plan: 1.  Review interval left lower extremity duplex- chronic peroneal thrombosis. 2.  Continue Eliquis as prescribed. 3.  Cancel scheduled appointment in March.  4.  RTC in 3 months for MD assessment and labs (CBC with diff, CMP)   Honor Loh, NP 07/06/2017, 2:31 PM   I saw and evaluated the patient, participating in the key portions of the service and reviewing pertinent diagnostic studies and records.  I reviewed the nurse practitioner's note and agree with the findings and the plan.  The assessment and plan were discussed with the patient.  Several questions were asked by the patient and answered.   Nolon Stalls, MD 07/06/2017, 2:31 PM

## 2017-07-09 DIAGNOSIS — M25562 Pain in left knee: Secondary | ICD-10-CM | POA: Diagnosis not present

## 2017-07-09 DIAGNOSIS — M6281 Muscle weakness (generalized): Secondary | ICD-10-CM | POA: Diagnosis not present

## 2017-07-09 DIAGNOSIS — R609 Edema, unspecified: Secondary | ICD-10-CM | POA: Diagnosis not present

## 2017-07-10 ENCOUNTER — Other Ambulatory Visit: Payer: Self-pay | Admitting: Hematology and Oncology

## 2017-07-10 ENCOUNTER — Other Ambulatory Visit: Payer: Self-pay | Admitting: Urgent Care

## 2017-07-11 DIAGNOSIS — M25562 Pain in left knee: Secondary | ICD-10-CM | POA: Diagnosis not present

## 2017-07-11 DIAGNOSIS — R609 Edema, unspecified: Secondary | ICD-10-CM | POA: Diagnosis not present

## 2017-07-11 DIAGNOSIS — M6281 Muscle weakness (generalized): Secondary | ICD-10-CM | POA: Diagnosis not present

## 2017-07-12 ENCOUNTER — Encounter: Payer: Self-pay | Admitting: Emergency Medicine

## 2017-07-12 ENCOUNTER — Emergency Department
Admission: EM | Admit: 2017-07-12 | Discharge: 2017-07-12 | Disposition: A | Discharge status: Home / Self Care | Payer: 59 | Attending: Emergency Medicine | Admitting: Emergency Medicine

## 2017-07-12 ENCOUNTER — Other Ambulatory Visit: Payer: Self-pay

## 2017-07-12 ENCOUNTER — Encounter: Payer: Self-pay | Admitting: Family Medicine

## 2017-07-12 DIAGNOSIS — Z8719 Personal history of other diseases of the digestive system: Secondary | ICD-10-CM | POA: Diagnosis not present

## 2017-07-12 DIAGNOSIS — R1013 Epigastric pain: Secondary | ICD-10-CM | POA: Diagnosis not present

## 2017-07-12 DIAGNOSIS — Z87891 Personal history of nicotine dependence: Secondary | ICD-10-CM | POA: Insufficient documentation

## 2017-07-12 DIAGNOSIS — R112 Nausea with vomiting, unspecified: Secondary | ICD-10-CM | POA: Diagnosis not present

## 2017-07-12 DIAGNOSIS — Z7901 Long term (current) use of anticoagulants: Secondary | ICD-10-CM | POA: Insufficient documentation

## 2017-07-12 LAB — CBC
HCT: 39.3 % (ref 35.0–47.0)
Hemoglobin: 13.1 g/dL (ref 12.0–16.0)
MCH: 30.5 pg (ref 26.0–34.0)
MCHC: 33.3 g/dL (ref 32.0–36.0)
MCV: 91.7 fL (ref 80.0–100.0)
Platelets: 254 10*3/uL (ref 150–440)
RBC: 4.29 MIL/uL (ref 3.80–5.20)
RDW: 12.3 % (ref 11.5–14.5)
WBC: 6.6 10*3/uL (ref 3.6–11.0)

## 2017-07-12 LAB — COMPREHENSIVE METABOLIC PANEL
ALT: 12 U/L — ABNORMAL LOW (ref 14–54)
AST: 16 U/L (ref 15–41)
Albumin: 4.5 g/dL (ref 3.5–5.0)
Alkaline Phosphatase: 55 U/L (ref 38–126)
Anion gap: 8 (ref 5–15)
BUN: 8 mg/dL (ref 6–20)
CO2: 24 mmol/L (ref 22–32)
Calcium: 8.8 mg/dL — ABNORMAL LOW (ref 8.9–10.3)
Chloride: 106 mmol/L (ref 101–111)
Creatinine, Ser: 0.53 mg/dL (ref 0.44–1.00)
GFR calc Af Amer: >60 mL/min (ref >60)
GFR calc non Af Amer: >60 mL/min (ref >60)
Glucose, Bld: 93 mg/dL (ref 65–99)
Potassium: 3.9 mmol/L (ref 3.5–5.1)
Sodium: 138 mmol/L (ref 135–145)
Total Bilirubin: 1.4 mg/dL — ABNORMAL HIGH (ref 0.3–1.2)
Total Protein: 8 g/dL (ref 6.5–8.1)

## 2017-07-12 LAB — TROPONIN I: Troponin I: <0.03 ng/mL (ref <0.03)

## 2017-07-12 LAB — URINALYSIS, COMPLETE (UACMP) WITH MICROSCOPIC
Bacteria, UA: NONE SEEN
Bilirubin Urine: NEGATIVE
Glucose, UA: NEGATIVE mg/dL
Hgb urine dipstick: NEGATIVE
Ketones, ur: NEGATIVE mg/dL
Leukocytes, UA: NEGATIVE
Nitrite: NEGATIVE
Protein, ur: NEGATIVE mg/dL
Specific Gravity, Urine: 1.019 (ref 1.005–1.030)
pH: 6 (ref 5.0–8.0)

## 2017-07-12 LAB — LIPASE, BLOOD: Lipase: 35 U/L (ref 11–51)

## 2017-07-12 MED ORDER — OMEPRAZOLE 40 MG PO CPDR: 40.0000 mg | DELAYED_RELEASE_CAPSULE | Freq: Every day | ORAL | 0 refills | Status: DC

## 2017-07-12 MED ORDER — ONDANSETRON 4 MG PO TBDP: 4.0000 mg | ORAL_TABLET | Freq: Three times a day (TID) | ORAL | 0 refills | Status: DC | PRN

## 2017-07-12 MED ORDER — ONDANSETRON 4 MG PO TBDP
4.0000 mg | ORAL_TABLET | Freq: Once | ORAL | Status: AC | PRN
  Administered 2017-07-12: 4 mg via ORAL
  Filled 2017-07-12: qty 1

## 2017-07-12 MED ORDER — ONDANSETRON 4 MG PO TBDP
4.0000 mg | ORAL_TABLET | Freq: Once | ORAL | Status: AC
  Administered 2017-07-12: 4 mg via ORAL

## 2017-07-12 MED ORDER — ONDANSETRON 4 MG PO TBDP
ORAL_TABLET | ORAL | Status: AC
  Filled 2017-07-12: qty 1

## 2017-07-12 MED ORDER — ACETAMINOPHEN 500 MG PO TABS
1000.0000 mg | ORAL_TABLET | Freq: Once | ORAL | Status: AC
  Administered 2017-07-12: 1000 mg via ORAL
  Filled 2017-07-12: qty 2

## 2017-07-12 MED ORDER — GI COCKTAIL ~~LOC~~
30.0000 mL | Freq: Once | ORAL | Status: AC
  Administered 2017-07-12: 30 mL via ORAL
  Filled 2017-07-12: qty 30

## 2017-07-12 MED ORDER — ONDANSETRON 4 MG PO TBDP
4.0000 mg | ORAL_TABLET | Freq: Once | ORAL | Status: AC
  Administered 2017-07-12: 4 mg via ORAL
  Filled 2017-07-12: qty 1

## 2017-07-12 NOTE — ED Triage Notes (Signed)
Pt presents to ED with sharp epigastric abd pain since yesterday and vomiting X7 today. Pt states several years ago she had H pylori and symptoms feel similar. Pt states yesterday she was belching frequently and reports it smelled badly.

## 2017-07-12 NOTE — Discharge Instructions (Signed)
Please drink plenty of fluids stay well-hydrated.  You may take a clear liquid diet for the next 24-48 hours, then advance to a bland diet as tolerated.  Zofran is for nausea.  Please restart taking omeprazole daily regardless of your symptoms.  Return to the emergency department if you develop severe pain, nausea or vomiting, fever, or any other symptoms concerning to you.

## 2017-07-12 NOTE — ED Provider Notes (Signed)
[facility] Emergency Department Provider Note  ____________________________________________  Time seen: Approximately 9:15 PM  I have reviewed the triage vital signs and the nursing notes.   HISTORY  Chief Complaint Vomiting and Abdominal Pain    HPI Sumie Remsen is a 37 y.o. female with a history of DVT and protein C deficiency on Eliquis, remote cholecystectomy, H pylori infection remotely, history of renal colic, presenting with epigastric pain nausea and vomiting.  The patient reports that since yesterday she has been having a sharp epigastric pain, and multiple episodes of vomiting.  This is similar to when she had H. pylori in the past.  She used to be on omeprazole but is no longer taking this medication.  She has not had any constipation or diarrhea and her last bowel movement was today which was normal.  She has not had any fevers or chills, dysuria, urinary frequency or change in vaginal discharge.  No lightheadedness or syncope.  She has not tried anything for her symptoms.  Past Medical History:  Diagnosis Date  . Anemia    h/o  . Cervical high risk human papillomavirus (HPV) DNA test positive   . Chronic gastritis   . Depression   . DVT (deep venous thrombosis) ([place]) 09/2016   left leg  . H. pylori infection   . History of attempted suicide   . History of gastric polyp   . History of kidney stones    h/o  . Migraines   . Protein C deficiency ([place])   . Rash   . Shift work sleep disorder     Patient Active Problem List   Diagnosis Date Noted  . Vagina bleeding 02/27/2017  . Protein C deficiency ([place]) 12/10/2016  . Monoclonal gammopathy of unknown significance (MGUS) 11/30/2016  . Fibroid 11/22/2016  . Menorrhagia with regular cycle 11/02/2016  . Menstrual migraine without status migrainosus, not intractable 11/02/2016  . History of DVT (deep vein thrombosis) 11/02/2016  . Elevated blood protein 09/26/2016  . Chronic deep vein  thrombosis (DVT) of distal vein of left lower extremity ([place]) 09/17/2016  . Vitamin D deficiency 07/26/2015  . Thyroid cyst 07/26/2015  . Pap smear of cervix shows high risk HPV present 04/22/2015  . History of Helicobacter pylori infection 04/22/2015  . Migraine with aura and without status migrainosus 04/22/2015  . Shift work sleep disorder 04/22/2015    Past Surgical History:  Procedure Laterality Date  . ABDOMINAL HYSTERECTOMY    . CHOLECYSTECTOMY    . CYSTOSCOPY  12/19/2016   Procedure: CYSTOSCOPY;  Surgeon: Gae Dry, MD;  Location: [facility] ORS;  Service: Gynecology;;  . LAPAROSCOPIC HYSTERECTOMY Bilateral 12/19/2016   Procedure: HYSTERECTOMY TOTAL LAPAROSCOPIC BILATERAL SALPINGECTOMY;  Surgeon: Gae Dry, MD;  Location: [facility] ORS;  Service: Gynecology;  Laterality: Bilateral;  . TUBAL LIGATION      Current Outpatient Rx  . Order #: [id number] Class: Historical Med  . Order #: [id number] Class: Normal  . Order #: [id number] Class: Historical Med  . Order #: [id number] Class: Historical Med  . Order #: [id number] Class: Historical Med  . Order #: [id number] Class: Print  . Order #: [id number] Class: Print  . Order #: [id number] Class: Historical Med  . Order #: [id number] Class: Normal    Allergies Patient has no known allergies.  Family History  Problem Relation Age of Onset  . HIV Sister   . Cancer Sister 6       cervical cancer  . Asthma Daughter   . Hypertension Maternal Grandmother   .  Hypertension Mother   . Obesity Mother   . Cancer Father 57       lung cancer  . Deep vein thrombosis Father   . COPD Paternal Grandmother   . Deep vein thrombosis Paternal Grandmother     Social History Social History   Tobacco Use  . Smoking status: Former Smoker    Packs/day: 0.25    Years: 5.00    Pack years: 1.25    Types: Cigarettes    Start date: 05/15/2002    Last attempt to quit: 05/16/2007    Years since quitting: 10.1  . Smokeless tobacco: Never Used  Substance Use  Topics  . Alcohol use: Yes    Alcohol/week: 0.6 oz    Types: 1 Glasses of wine per week    Comment: occ wine  . Drug use: No    Review of Systems Constitutional: No fever/chills.  No lightheadedness or syncope. Eyes: No visual changes. ENT: No sore throat. No congestion or rhinorrhea. Cardiovascular: Denies chest pain. Denies palpitations. Respiratory: Denies shortness of breath.  No cough. Gastrointestinal: Positive epigastric abdominal pain.  No nausea, no vomiting.  No diarrhea.  No constipation. Genitourinary: Negative for dysuria. No change in vaginal discharge. Musculoskeletal: Negative for back pain. Skin: Negative for rash. Neurological: Negative for headaches. No focal numbness, tingling or weakness.     ____________________________________________   PHYSICAL EXAM:  VITAL SIGNS: ED Triage Vitals [07/12/17 1925]  Enc Vitals Group     BP 111/61     Pulse Rate 84     Resp 20     Temp 98.4 F (36.9 C)     Temp Source Oral     SpO2 98 %     Weight 170 lb (77.1 kg)     Height 5\' 6"  (1.676 m)     Head Circumference      Peak Flow      Pain Score 6     Pain Loc      Pain Edu?      Excl. in [place]?     Constitutional: Alert and oriented. Well appearing and in no acute distress. Answers questions appropriately. Eyes: Conjunctivae are normal.  EOMI. No scleral icterus. Head: Atraumatic. Nose: No congestion/rhinnorhea. Mouth/Throat: Mucous membranes are moist.  Neck: No stridor.  Supple.  No meningismus. Cardiovascular: Normal rate, regular rhythm. No murmurs, rubs or gallops.  Respiratory: Normal respiratory effort.  No accessory muscle use or retractions. Lungs CTAB.  No wheezes, rales or ronchi. Gastrointestinal: Soft, and nondistended.  Minimal tenderness to palpation in the epigastrium.  No right upper quadrant tenderness. No guarding or rebound.  No peritoneal signs. Musculoskeletal: No LE edema. No ttp in the calves or palpable cords.  Negative Homan's  sign. Neurologic:  A&Ox3.  Speech is clear.  Face and smile are symmetric.  EOMI.  Moves all extremities well. Skin:  Skin is warm, dry and intact. No rash noted. Psych: Depressed mood with normal affect and speech.  ____________________________________________   LABS (all labs ordered are listed, but only abnormal results are displayed)  Labs Reviewed  COMPREHENSIVE METABOLIC PANEL - Abnormal; Notable for the following components:      Result Value   Calcium 8.8 (*)    ALT 12 (*)    Total Bilirubin 1.4 (*)    All other components within normal limits  URINALYSIS, COMPLETE (UACMP) WITH MICROSCOPIC - Abnormal; Notable for the following components:   Color, Urine YELLOW (*)    APPearance HAZY (*)  Squamous Epithelial / LPF 0-5 (*)    All other components within normal limits  LIPASE, BLOOD  CBC  TROPONIN I   ____________________________________________  EKG  ED ECG REPORT I, Eula Listen, the attending physician, personally viewed and interpreted this ECG.   Date: 07/12/2017  EKG Time: 1927  Rate: 82  Rhythm: normal sinus rhythm  Axis: normal  Intervals:none  ST&T Change: No STEMI  ____________________________________________  RADIOLOGY  No results found.  ____________________________________________   PROCEDURES  Procedure(s) performed: None  Procedures  Critical Care performed: No ____________________________________________   INITIAL IMPRESSION / ASSESSMENT AND PLAN / ED COURSE  Pertinent labs & imaging results that were available during my care of the patient were reviewed by me and considered in my medical decision making (see chart for details).  37 y.o. female presenting with epigastric discomfort, nausea and vomiting.  Overall, the patient is hemodynamically stable.  She is afebrile.  The patient's abdominal examination is reassuring and an acute surgical pathology is very unlikely.  Imaging of the abdomen is not indicated at this  time.  She is a normal white blood cell count, and her lipase is also normal so unlikely pancreatitis.  It is possible the patient has reflux or peptic ulcer disease.  A viral GI illness is also possible, but I would expect some diarrhea or loose stool with this.  It is very unlikely that the patient has ischemia to her gut due to her protein deficiency and clotting disorder, although this was considered and the patient was given specific return precautions for this. At this time, we will plan to initiate symptomatic treatment and make sure the patient is able to tolerate liquids.  I anticipate discharge home with reinitiation of omeprazole and close PMD follow-up.  Return precautions were discussed.  ----------------------------------------- 10:13 PM on 07/12/2017 -----------------------------------------  The patient continues to be hemodynamically stable and is able to tolerate liquids without any difficulty.  At this time, the patient is safe for discharge.  ____________________________________________  FINAL CLINICAL IMPRESSION(S) / ED DIAGNOSES  Final diagnoses:  Epigastric pain  Nausea and vomiting, intractability of vomiting not specified, unspecified vomiting type         NEW MEDICATIONS STARTED DURING THIS VISIT:  New Prescriptions   OMEPRAZOLE (PRILOSEC) 40 MG CAPSULE    Take 1 capsule (40 mg total) by mouth daily.   ONDANSETRON (ZOFRAN ODT) 4 MG DISINTEGRATING TABLET    Take 1 tablet (4 mg total) by mouth every 8 (eight) hours as needed for nausea or vomiting.      Eula Listen, MD 07/12/17 2214

## 2017-07-13 ENCOUNTER — Encounter: Payer: Self-pay | Admitting: Family Medicine

## 2017-07-13 ENCOUNTER — Ambulatory Visit: Payer: 59 | Admitting: Family Medicine

## 2017-07-13 VITALS — BP 110/60 | HR 94 | Temp 97.9°F | Resp 16 | Ht 66.0 in | Wt 174.1 lb

## 2017-07-13 DIAGNOSIS — D6859 Other primary thrombophilia: Secondary | ICD-10-CM

## 2017-07-13 DIAGNOSIS — E669 Obesity, unspecified: Secondary | ICD-10-CM | POA: Diagnosis not present

## 2017-07-13 DIAGNOSIS — Z8619 Personal history of other infectious and parasitic diseases: Secondary | ICD-10-CM | POA: Diagnosis not present

## 2017-07-13 DIAGNOSIS — R1013 Epigastric pain: Secondary | ICD-10-CM | POA: Diagnosis not present

## 2017-07-13 NOTE — Progress Notes (Signed)
Name: Tammy Garza   MRN: [id number]    DOB: 09/24/1980   Date:07/13/2017       Progress Note  Subjective  Chief Complaint  Chief Complaint  Patient presents with  . Heartburn    Patient started vomiting yesterday and went to [facility]-was given Zofran and Prilosec. Patient states when she belchs it smells like sulfur, abdominal cramping and nausea.      HPI  Dyspepsia: she has a history of h. Pylori, two nights ago she developed epigastric pain, indigestion, decrease in appetite suddenly. No sick contacts. She states she also has eructation that smells like sulfur, and yesterday she had multiple of vomiting and went to [facility]. Labs were essentially normal. Negative EKG. She was given omeprazole and zofran. She still has symptoms, but no vomiting since yesterday afternoon. Loose stools this morning, but no blood or mucus. She feels bloated.   Obesity: she lost 14 lbs since last visit 05/2016. She is on Saxenda but did not take dose today. Advised to hold off for now   Patient Active Problem List   Diagnosis Date Noted  . Vagina bleeding 02/27/2017  . Protein C deficiency ([place]) 12/10/2016  . Monoclonal gammopathy of unknown significance (MGUS) 11/30/2016  . Fibroid 11/22/2016  . Menorrhagia with regular cycle 11/02/2016  . Menstrual migraine without status migrainosus, not intractable 11/02/2016  . History of DVT (deep vein thrombosis) 11/02/2016  . Elevated blood protein 09/26/2016  . Chronic deep vein thrombosis (DVT) of distal vein of left lower extremity ([place]) 09/17/2016  . Vitamin D deficiency 07/26/2015  . Thyroid cyst 07/26/2015  . Pap smear of cervix shows high risk HPV present 04/22/2015  . History of Helicobacter pylori infection 04/22/2015  . Migraine with aura and without status migrainosus 04/22/2015  . Shift work sleep disorder 04/22/2015    Past Surgical History:  Procedure Laterality Date  . ABDOMINAL HYSTERECTOMY    . CHOLECYSTECTOMY    . CYSTOSCOPY  12/19/2016   Procedure: CYSTOSCOPY;  Surgeon: Gae Dry, MD;  Location: [facility] ORS;  Service: Gynecology;;  . LAPAROSCOPIC HYSTERECTOMY Bilateral 12/19/2016   Procedure: HYSTERECTOMY TOTAL LAPAROSCOPIC BILATERAL SALPINGECTOMY;  Surgeon: Gae Dry, MD;  Location: [facility] ORS;  Service: Gynecology;  Laterality: Bilateral;  . TUBAL LIGATION      Family History  Problem Relation Age of Onset  . HIV Sister   . Cancer Sister 67       cervical cancer  . Asthma Daughter   . Hypertension Maternal Grandmother   . Hypertension Mother   . Obesity Mother   . Cancer Father 67       lung cancer  . Deep vein thrombosis Father   . COPD Paternal Grandmother   . Deep vein thrombosis Paternal Grandmother     Social History   Socioeconomic History  . Marital status: Divorced    Spouse name: Not on file  . Number of children: Not on file  . Years of education: Not on file  . Highest education level: Not on file  Social Needs  . Financial resource strain: Not on file  . Food insecurity - worry: Not on file  . Food insecurity - inability: Not on file  . Transportation needs - medical: Not on file  . Transportation needs - non-medical: Not on file  Occupational History  . Not on file  Tobacco Use  . Smoking status: Former Smoker    Packs/day: 0.25    Years: 5.00    Pack years: 1.25  Types: Cigarettes    Start date: 05/15/2002    Last attempt to quit: 05/16/2007    Years since quitting: 10.1  . Smokeless tobacco: Never Used  Substance and Sexual Activity  . Alcohol use: Yes    Alcohol/week: 0.6 oz    Types: 1 Glasses of wine per week    Comment: occ wine  . Drug use: No  . Sexual activity: Yes    Partners: Male    Birth control/protection: Surgical    Comment: Tubal Ligation  Other Topics Concern  . Not on file  Social History Narrative   Separated since June 2016 - she has two children at home . The youngest is his daughter. Working at [employer] in [place]. She works at [employer].  She had been laid off by back since 2017.    Dating Gwyndolyn Saxon since 2015     Current Outpatient Medications:  .  chlorproMAZINE (THORAZINE) 25 MG tablet, TAKE 1-2TABS AS NEEDED FOR HEADACHE RESCUE MAY REPEAT IN 2-3HOURS MAX 4 TABS/24 HOURS, Disp: , Rfl: 1 .  ELIQUIS 5 MG TABS tablet, TAKE 1 TABLET BY MOUTH TWICE A DAY, Disp: 70 tablet, Rfl: 0 .  gabapentin (NEURONTIN) 100 MG capsule, Take 300 mg by mouth daily., Disp: , Rfl: 2 .  glycopyrrolate (ROBINUL) 1 MG tablet, Take 3 mg by mouth daily., Disp: , Rfl: 2 .  NOVOFINE PLUS 32G X 4 MM MISC, See admin instructions., Disp: , Rfl: 1 .  omeprazole (PRILOSEC) 40 MG capsule, Take 1 capsule (40 mg total) by mouth daily., Disp: 30 capsule, Rfl: 0 .  ondansetron (ZOFRAN ODT) 4 MG disintegrating tablet, Take 1 tablet (4 mg total) by mouth every 8 (eight) hours as needed for nausea or vomiting., Disp: 10 tablet, Rfl: 0 .  SAXENDA 18 MG/3ML SOPN, INJECT 2.8 MG INTO THE SKIN DAILY., Disp: , Rfl: 3 .  Vitamin D, Ergocalciferol, (DRISDOL) 50000 units CAPS capsule, TAKE 1 CAPSULE (50,000 UNITS TOTAL) BY MOUTH EVERY 7 (SEVEN) DAYS., Disp: 12 capsule, Rfl: 0  No Known Allergies   ROS  Constitutional: Negative for fever , positive for  weight change.  Respiratory: Negative for cough and shortness of breath.   Cardiovascular: Negative for chest pain or palpitations.  Gastrointestinal: Positive  for abdominal pain, and  bowel changes.  Musculoskeletal: Negative for gait problem or joint swelling.  Skin: Negative for rash.  Neurological: Negative for dizziness or headache.  No other specific complaints in a complete review of systems (except as listed in HPI above).  Objective  Vitals:   07/13/17 1340  BP: 110/60  Pulse: 94  Resp: 16  Temp: 97.9 F (36.6 C)  TempSrc: Oral  SpO2: 97%  Weight: 174 lb 1.6 oz (79 kg)  Height: 5' 6"  (1.676 m)    Body mass index is 28.1 kg/m.  Physical Exam  Constitutional: Patient appears well-developed and  well-nourished.  No distress.  HEENT: head atraumatic, normocephalic, pupils equal and reactive to light,  neck supple, throat within normal limits Cardiovascular: Normal rate, regular rhythm and normal heart sounds.  No murmur heard. No BLE edema. No calf tenderness Pulmonary/Chest: Effort normal and breath sounds normal. No respiratory distress. Abdominal: Soft.  There is epigastric  Tenderness, normal bowel sounds, no rebound tenderness Psychiatric: Patient has a normal mood and affect. behavior is normal. Judgment and thought content normal. Muscular Skeletal: mild medial knee pain  Recent Results (from the past 2160 hour(s))  POCT HgB A1C     Status:  Normal   Collection Time: 05/17/17 12:04 PM  Result Value Ref Range   Hemoglobin A1C 4.8   Lipase, blood     Status: None   Collection Time: 07/12/17  7:24 PM  Result Value Ref Range   Lipase 35 11 - 51 U/L    Comment: Performed at [facility], [address] [place], [place] [address]  Comprehensive metabolic panel     Status: Abnormal   Collection Time: 07/12/17  7:24 PM  Result Value Ref Range   Sodium 138 135 - 145 mmol/L   Potassium 3.9 3.5 - 5.1 mmol/L   Chloride 106 101 - 111 mmol/L   CO2 24 22 - 32 mmol/L   Glucose, Bld 93 65 - 99 mg/dL   BUN 8 6 - 20 mg/dL   Creatinine, Ser 0.53 0.44 - 1.00 mg/dL   Calcium 8.8 (L) 8.9 - 10.3 mg/dL   Total Protein 8.0 6.5 - 8.1 g/dL   Albumin 4.5 3.5 - 5.0 g/dL   AST 16 15 - 41 U/L   ALT 12 (L) 14 - 54 U/L   Alkaline Phosphatase 55 38 - 126 U/L   Total Bilirubin 1.4 (H) 0.3 - 1.2 mg/dL   GFR calc non Af Amer >60 >60 mL/min   GFR calc Af Amer >60 >60 mL/min    Comment: (NOTE) The eGFR has been calculated using the CKD EPI equation. This calculation has not been validated in all clinical situations. eGFR's persistently <60 mL/min signify possible Chronic Kidney Disease.    Anion gap 8 5 - 15    Comment: Performed at [facility], [address]  [place], [place] [address]  CBC     Status: None   Collection Time: 07/12/17  7:24 PM  Result Value Ref Range   WBC 6.6 3.6 - 11.0 K/uL   RBC 4.29 3.80 - 5.20 MIL/uL   Hemoglobin 13.1 12.0 - 16.0 g/dL   HCT 39.3 35.0 - 47.0 %   MCV 91.7 80.0 - 100.0 fL   MCH 30.5 26.0 - 34.0 pg   MCHC 33.3 32.0 - 36.0 g/dL   RDW 12.3 11.5 - 14.5 %   Platelets 254 150 - 440 K/uL    Comment: Performed at [facility], [address] [place], [place] [address]  Urinalysis, Complete w Microscopic     Status: Abnormal   Collection Time: 07/12/17  7:24 PM  Result Value Ref Range   Color, Urine YELLOW (A) YELLOW   APPearance HAZY (A) CLEAR   Specific Gravity, Urine 1.019 1.005 - 1.030   pH 6.0 5.0 - 8.0   Glucose, UA NEGATIVE NEGATIVE mg/dL   Hgb urine dipstick NEGATIVE NEGATIVE   Bilirubin Urine NEGATIVE NEGATIVE   Ketones, ur NEGATIVE NEGATIVE mg/dL   Protein, ur NEGATIVE NEGATIVE mg/dL   Nitrite NEGATIVE NEGATIVE   Leukocytes, UA NEGATIVE NEGATIVE   RBC / HPF 0-5 0 - 5 RBC/hpf   WBC, UA 0-5 0 - 5 WBC/hpf   Bacteria, UA NONE SEEN NONE SEEN   Squamous Epithelial / LPF 0-5 (A) NONE SEEN   Mucus PRESENT     Comment: Performed at [facility], [address] [place], [place] [address]  Troponin I     Status: None   Collection Time: 07/12/17  7:24 PM  Result Value Ref Range   Troponin I <0.03 <0.03 ng/mL    Comment: Performed at [facility], [address], [place],  [address]  PHQ2/9: Depression screen [facility] 2/9 05/17/2017 11/20/2016 08/22/2016 06/14/2016 02/22/2016  Decreased Interest 0 0 0 0 0  Down, Depressed, Hopeless 0 0 0 0 0  PHQ - 2 Score 0 0 0 0 0     Fall Risk: Fall Risk  05/17/2017 11/20/2016 08/22/2016 06/14/2016 02/22/2016  Falls in the past year? No No No No No     Assessment & Plan  1. Dyspepsia  [facility]  - H. pylori breath test  2. History of Helicobacter pylori infection  - H. pylori breath test  3. Protein C  deficiency ([place])  Seeing Dr. Mike Gip after knee surgery , developed left lower leg pain , ortho thought it was a new clot but evaluated by hematologist and it was the same previous clot. Pain resolved and she is on the same dose of Eliquis  4. Obesity (BMI 30.0-34.9)  Stop Saxenda until indigestion improves and titrate up weekly from 0.6 dose

## 2017-07-13 NOTE — Patient Instructions (Signed)

## 2017-07-16 LAB — H. PYLORI BREATH TEST: H. pylori Breath Test: NOT DETECTED

## 2017-07-18 DIAGNOSIS — R609 Edema, unspecified: Secondary | ICD-10-CM | POA: Diagnosis not present

## 2017-07-18 DIAGNOSIS — M25562 Pain in left knee: Secondary | ICD-10-CM | POA: Diagnosis not present

## 2017-07-18 DIAGNOSIS — M6281 Muscle weakness (generalized): Secondary | ICD-10-CM | POA: Diagnosis not present

## 2017-07-23 DIAGNOSIS — M6281 Muscle weakness (generalized): Secondary | ICD-10-CM | POA: Diagnosis not present

## 2017-07-23 DIAGNOSIS — R609 Edema, unspecified: Secondary | ICD-10-CM | POA: Diagnosis not present

## 2017-07-23 DIAGNOSIS — M25562 Pain in left knee: Secondary | ICD-10-CM | POA: Diagnosis not present

## 2017-07-25 DIAGNOSIS — M25562 Pain in left knee: Secondary | ICD-10-CM | POA: Diagnosis not present

## 2017-07-25 DIAGNOSIS — M6281 Muscle weakness (generalized): Secondary | ICD-10-CM | POA: Diagnosis not present

## 2017-07-25 DIAGNOSIS — R609 Edema, unspecified: Secondary | ICD-10-CM | POA: Diagnosis not present

## 2017-07-31 ENCOUNTER — Other Ambulatory Visit: Payer: 59

## 2017-07-31 ENCOUNTER — Ambulatory Visit: Payer: 59 | Admitting: Hematology and Oncology

## 2017-08-01 DIAGNOSIS — M25562 Pain in left knee: Secondary | ICD-10-CM | POA: Diagnosis not present

## 2017-08-01 DIAGNOSIS — R609 Edema, unspecified: Secondary | ICD-10-CM | POA: Diagnosis not present

## 2017-08-01 DIAGNOSIS — M6281 Muscle weakness (generalized): Secondary | ICD-10-CM | POA: Diagnosis not present

## 2017-08-06 DIAGNOSIS — M6281 Muscle weakness (generalized): Secondary | ICD-10-CM | POA: Diagnosis not present

## 2017-08-06 DIAGNOSIS — R609 Edema, unspecified: Secondary | ICD-10-CM | POA: Diagnosis not present

## 2017-08-06 DIAGNOSIS — M25562 Pain in left knee: Secondary | ICD-10-CM | POA: Diagnosis not present

## 2017-08-08 ENCOUNTER — Other Ambulatory Visit: Payer: Self-pay | Admitting: Urgent Care

## 2017-08-08 DIAGNOSIS — G43719 Chronic migraine without aura, intractable, without status migrainosus: Secondary | ICD-10-CM | POA: Diagnosis not present

## 2017-08-08 DIAGNOSIS — G43019 Migraine without aura, intractable, without status migrainosus: Secondary | ICD-10-CM | POA: Diagnosis not present

## 2017-08-14 ENCOUNTER — Encounter: Payer: Self-pay | Admitting: Hematology and Oncology

## 2017-08-14 ENCOUNTER — Other Ambulatory Visit: Payer: Self-pay | Admitting: Hematology and Oncology

## 2017-08-20 ENCOUNTER — Encounter: Payer: Self-pay | Admitting: Family Medicine

## 2017-08-20 ENCOUNTER — Ambulatory Visit (INDEPENDENT_AMBULATORY_CARE_PROVIDER_SITE_OTHER): Payer: 59 | Admitting: Nurse Practitioner

## 2017-08-20 ENCOUNTER — Encounter: Payer: Self-pay | Admitting: Nurse Practitioner

## 2017-08-20 VITALS — BP 110/68 | HR 79 | Temp 98.7°F | Resp 16 | Ht 66.0 in | Wt 182.2 lb

## 2017-08-20 DIAGNOSIS — R339 Retention of urine, unspecified: Secondary | ICD-10-CM | POA: Diagnosis not present

## 2017-08-20 DIAGNOSIS — L299 Pruritus, unspecified: Secondary | ICD-10-CM

## 2017-08-20 DIAGNOSIS — R1310 Dysphagia, unspecified: Secondary | ICD-10-CM | POA: Diagnosis not present

## 2017-08-20 DIAGNOSIS — E663 Overweight: Secondary | ICD-10-CM | POA: Diagnosis not present

## 2017-08-20 DIAGNOSIS — E041 Nontoxic single thyroid nodule: Secondary | ICD-10-CM

## 2017-08-20 DIAGNOSIS — Z0000 Encounter for general adult medical examination without abnormal findings: Secondary | ICD-10-CM | POA: Diagnosis not present

## 2017-08-20 MED ORDER — LORCASERIN HCL ER 20 MG PO TB24: 20.0000 mg | ORAL_TABLET | Freq: Every day | ORAL | 3 refills | Status: DC

## 2017-08-20 NOTE — Patient Instructions (Signed)
-   Bring in cream you are using on your hands - Take belviq daily; continue exercise and diet  If you have not heard anything from my staff in a week about any orders/referrals/studies from today, please contact us here to follow-up [phone]

## 2017-08-20 NOTE — Progress Notes (Addendum)
Name: Tammy Garza   MRN: [id number]    DOB: [birth year]   Date:08/20/2017       Progress Note  Subjective  Chief Complaint  Chief Complaint  Patient presents with  . Annual Exam    HPI  Rash/pruritis  Patient notes small bumps on tops of hands, elbows and tops of foot; started when spring came. It is itchy; not painful, dry skin. Has tried Fluocinolone- daughters cream and another think prescription cream that she does not recall the name of- the second cream helped more. Patient states she uses it daily has helped with the itching.   Dysphagia/choking Patient states 2 years dx with diagnosed with thyroid cyst; states over the last month notes its been more difficult to swallow. States she has a little tickle in her throat and when she takes medication sometimes feels stuck there; sensation that medication is still in throat even when it gets swallowed. She eats smaller portions to prevent that sensation. Has most difficulty with thick foods like steak- choked on it and oldest daughter had to provide back pats to remove food- about a month ago. Denies problems breathing, cough. 07/30/2015 Impression: Thyroid tissue is essentially normal. There is one small nodule or cyst in the right thyroid lobe measuring 0.3 cm.  Incomplete emptying of bladder Urgency to urinate, goes to the restroom and urinates but still has pressure like there is more urine and tries to pee more but only a few drops come out. This has been consistent for the last 2-3 weeks. Patient denies hematuria, dysuria, and no incontinence. Drinks a lot of water.   Overweight: tried saxenda but was having  sulfur taste and GI distress stopped taking medication about 2 weeks ago and noted that she had some weight gain since then.  Diet: morning: 2 boiled eggs and water; Afternoon: sandwich and crangrape juice; Dinner: chicken alfredo, broccoli and a piece of toast. Snacks: apple sauce, snack size chips. Drinks: water, OJ, 2-3x a month-  whole milk; fruit teas occasionally when eating out. Exercise: just started being able to walk more post knee surgery; walking about a mile and half 3 times a week. Surgeon just cleared for light jogging.    The USPSTF recommendations: No family members with breast, ovarian, tubal, or peritoneal cancer Screening for cervical cancer every 3 years with cervical cytology completed last year    Office Visit from 08/20/2017 in [facility]  PHQ-2 Total Score  0     Last A1c was 4.8 on 05/2017 HIV- negative last year; no new sexual partners; monogamous relationship  Negative  screen for intimate partner violence  Tobacco- quit smoking 2009,  Vision- sees eye doctor annually; wears glasses    Patient Active Problem List   Diagnosis Date Noted  . Vagina bleeding 02/27/2017  . Protein C deficiency ([place]) 12/10/2016  . Monoclonal gammopathy of unknown significance (MGUS) 11/30/2016  . Fibroid 11/22/2016  . Menorrhagia with regular cycle 11/02/2016  . Menstrual migraine without status migrainosus, not intractable 11/02/2016  . History of DVT (deep vein thrombosis) 11/02/2016  . Elevated blood protein 09/26/2016  . Chronic deep vein thrombosis (DVT) of distal vein of left lower extremity ([place]) 09/17/2016  . Vitamin D deficiency 07/26/2015  . Thyroid cyst 07/26/2015  . Pap smear of cervix shows high risk HPV present 04/22/2015  . History of Helicobacter pylori infection 04/22/2015  . Migraine with aura and without status migrainosus 04/22/2015  . Shift work sleep disorder 04/22/2015  Past Surgical History:  Procedure Laterality Date  . ABDOMINAL HYSTERECTOMY    . CHOLECYSTECTOMY    . CYSTOSCOPY  12/19/2016   Procedure: CYSTOSCOPY;  Surgeon: Gae Dry, MD;  Location: [facility] ORS;  Service: Gynecology;;  . LAPAROSCOPIC HYSTERECTOMY Bilateral 12/19/2016   Procedure: HYSTERECTOMY TOTAL LAPAROSCOPIC BILATERAL SALPINGECTOMY;  Surgeon: Gae Dry, MD;  Location: [facility]  ORS;  Service: Gynecology;  Laterality: Bilateral;  . TUBAL LIGATION      Family History  Problem Relation Age of Onset  . HIV Sister   . Cancer Sister 34       cervical cancer  . Asthma Daughter   . Hypertension Maternal Grandmother   . Hypertension Mother   . Obesity Mother   . Cancer Father 40       lung cancer  . Deep vein thrombosis Father   . COPD Paternal Grandmother   . Deep vein thrombosis Paternal Grandmother     Social History   Socioeconomic History  . Marital status: Divorced    Spouse name: Not on file  . Number of children: Not on file  . Years of education: Not on file  . Highest education level: Not on file  Occupational History  . Not on file  Social Needs  . Financial resource strain: Not on file  . Food insecurity:    Worry: Not on file    Inability: Not on file  . Transportation needs:    Medical: Not on file    Non-medical: Not on file  Tobacco Use  . Smoking status: Former Smoker    Packs/day: 0.25    Years: 5.00    Pack years: 1.25    Types: Cigarettes    Start date: 05/15/2002    Last attempt to quit: 05/16/2007    Years since quitting: 10.2  . Smokeless tobacco: Never Used  Substance and Sexual Activity  . Alcohol use: Yes    Alcohol/week: 0.6 oz    Types: 1 Glasses of wine per week    Comment: occ wine  . Drug use: No  . Sexual activity: Yes    Partners: Male    Birth control/protection: Surgical    Comment: Tubal Ligation  Lifestyle  . Physical activity:    Days per week: Not on file    Minutes per session: Not on file  . Stress: Not on file  Relationships  . Social connections:    Talks on phone: Not on file    Gets together: Not on file    Attends religious service: Not on file    Active member of club or organization: Not on file    Attends meetings of clubs or organizations: Not on file    Relationship status: Not on file  . Intimate partner violence:    Fear of current or ex partner: Not on file    Emotionally  abused: Not on file    Physically abused: Not on file    Forced sexual activity: Not on file  Other Topics Concern  . Not on file  Social History Narrative   Separated since June 2016 - she has two children at home . The youngest is his daughter. Working at [employer] in [place]. She works at [employer]. She had been laid off by back since 2017.    Dating Gwyndolyn Saxon since 2015     Current Outpatient Medications:  .  chlorproMAZINE (THORAZINE) 25 MG tablet, TAKE 1-2TABS AS NEEDED FOR HEADACHE RESCUE MAY REPEAT IN 2-3HOURS  MAX 4 TABS/24 HOURS, Disp: , Rfl: 1 .  ELIQUIS 5 MG TABS tablet, TAKE 1 TABLET BY MOUTH TWICE A DAY, Disp: 70 tablet, Rfl: 0 .  gabapentin (NEURONTIN) 100 MG capsule, Take 300 mg by mouth daily., Disp: , Rfl: 2 .  glycopyrrolate (ROBINUL) 1 MG tablet, Take 3 mg by mouth daily., Disp: , Rfl: 2 .  omeprazole (PRILOSEC) 40 MG capsule, Take 1 capsule (40 mg total) by mouth daily., Disp: 30 capsule, Rfl: 0 .  ondansetron (ZOFRAN ODT) 4 MG disintegrating tablet, Take 1 tablet (4 mg total) by mouth every 8 (eight) hours as needed for nausea or vomiting., Disp: 10 tablet, Rfl: 0 .  Vitamin D, Ergocalciferol, (DRISDOL) 50000 units CAPS capsule, TAKE 1 CAPSULE (50,000 UNITS TOTAL) BY MOUTH EVERY 7 (SEVEN) DAYS., Disp: 12 capsule, Rfl: 0 .  NOVOFINE PLUS 32G X 4 MM MISC, See admin instructions., Disp: , Rfl: 1 .  SAXENDA 18 MG/3ML SOPN, INJECT 2.8 MG INTO THE SKIN DAILY., Disp: , Rfl: 3  No Known Allergies  Review of Systems  Constitutional: Positive for malaise/fatigue (moderate; shift worker). Negative for chills and fever.  HENT: Negative for congestion, hearing loss, nosebleeds, sinus pain and sore throat.   Eyes: Negative for blurred vision (wears glasses) and double vision.  Respiratory: Negative for cough, shortness of breath and wheezing.   Cardiovascular: Negative for chest pain (had an episode of cp and stiffness pain worse with movement last week; self-resolved ),  palpitations and leg swelling.  Gastrointestinal: Negative for abdominal pain, diarrhea, heartburn and nausea.  Genitourinary: Positive for urgency. Negative for dysuria, frequency and hematuria.  Musculoskeletal: Negative for falls, joint pain and myalgias.  Skin: Positive for itching.  Neurological: Positive for headaches (regular migraines- decreased with combomed). Negative for dizziness, tingling, sensory change and weakness.  Endo/Heme/Allergies: Negative for polydipsia. Does not bruise/bleed easily (on eliquis; ).  Psychiatric/Behavioral: Negative for depression and suicidal ideas. The patient is not nervous/anxious.     Objective  Vitals:   08/20/17 1000  BP: 110/68  Pulse: 79  Resp: 16  Temp: 98.7 F (37.1 C)  TempSrc: Oral  SpO2: 96%  Weight: 182 lb 3.2 oz (82.6 kg)  Height: 5\' 6"  (1.676 m)     Body mass index is 29.41 kg/m.  Physical Exam  Constitutional: She is oriented to person, place, and time and well-developed, well-nourished, and in no distress. No distress.  HENT:  Head: Normocephalic.  Right Ear: External ear normal. Right ear drainage: dried cerumen, non-occlusive.  Left Ear: External ear normal.  Nose: Nose normal.  Mouth/Throat: Oropharynx is clear and moist. No oropharyngeal exudate.  Eyes: Pupils are equal, round, and reactive to light. Conjunctivae and EOM are normal.  Neck: Normal range of motion. Neck supple. No JVD present. No tracheal deviation present. No thyromegaly (unable to palpate nodule) present.  Cardiovascular: Normal rate, regular rhythm, normal heart sounds and intact distal pulses.  Pulmonary/Chest: Effort normal and breath sounds normal. No stridor. No respiratory distress. She has no wheezes. She exhibits no tenderness.  Abdominal: Soft. Bowel sounds are normal. There is no tenderness.  Musculoskeletal: Normal range of motion. She exhibits no edema, tenderness (left knee non-tender) or deformity.  Lymphadenopathy:    She has no  cervical adenopathy.  Neurological: She is alert and oriented to person, place, and time. She displays normal reflexes.  Skin: Skin is warm and dry. No rash (unable to appreciate rash, excoriation marks noted to elbow) noted. She is not diaphoretic. No  erythema.  Psychiatric: Mood, memory, affect and judgment normal.     No results found for this or any previous visit (from the past 72 hour(s)).    PHQ2/9: Depression screen [facility] 2/9 08/20/2017 05/17/2017 11/20/2016 08/22/2016 06/14/2016  Decreased Interest 0 0 0 0 0  Down, Depressed, Hopeless 0 0 0 0 0  PHQ - 2 Score 0 0 0 0 0     Fall Risk: Fall Risk  08/20/2017 05/17/2017 11/20/2016 08/22/2016 06/14/2016  Falls in the past year? No No No No No       Assessment & Plan   1. Preventative health care Overall patient is doing well with health, has gained some weight since last visit states because she stopped saxenda.  She no longer smokes, has been increasing her activity since knee surgery.  She does enjoy walking around her neighborhood and biking.  She is in a monogamous relationship with partner denies partner violence.  Has annual eye doctor appointments.  Endorses skin itching but rash is resolved with cream she has been using from dermatologist-will bring cream at next visit and inform us if symptoms are not improving or worsening sooner.  2. Thyroid cyst -Nodule not palpated at this visit, patient notes she has dysphasia and feels that area where the nodule was is tender at times; not tender presently. - US THYROID; Future  3. Overweight (BMI 25.0-29.9) Patient endorses stopping saxenda due to GI distress.  Patient states has been trying to eat well and has been increasing exercise as tolerated post knee surgery.  Wishing to be on new weight loss medication; will try Belviq.  Encouraged increasing water intake, cutting down carbs and increasing vegetable intake. - Lorcaserin HCl ER (BELVIQ XR) 20 MG TB24; Take 20 mg by mouth daily.   Dispense: 30 tablet; Refill: 3  4. Dysphagia, unspecified type Patient endorses increased dysphagia with an episode of choking earlier this month; will send to GI for further workup - Ambulatory referral to Gastroenterology  5. Pruritus -Rash appears to have resolved with cream previously prescribed by dermatologist.  Requested patient bring cream and at next visit for weight loss follow-up or come in sooner if rash appears again for itching does not resolve.  6. Incomplete bladder emptying  - refer to urology   I have reviewed this encounter including the documentation in this note and/or discussed this patient with the provider, Suezanne Cheshire DNP AGNP-C. I am certifying that I agree with the content of this note as supervising physician. [name], MD [facility] 08/20/2017, 5:49 PM

## 2017-08-21 ENCOUNTER — Encounter: Payer: Self-pay | Admitting: Family Medicine

## 2017-08-23 ENCOUNTER — Encounter: Payer: 59 | Admitting: Family Medicine

## 2017-08-29 DIAGNOSIS — R262 Difficulty in walking, not elsewhere classified: Secondary | ICD-10-CM | POA: Diagnosis not present

## 2017-08-29 DIAGNOSIS — M6281 Muscle weakness (generalized): Secondary | ICD-10-CM | POA: Diagnosis not present

## 2017-09-10 ENCOUNTER — Other Ambulatory Visit: Payer: Self-pay | Admitting: Hematology and Oncology

## 2017-09-10 ENCOUNTER — Ambulatory Visit: Payer: 59 | Admitting: Urology

## 2017-09-18 ENCOUNTER — Other Ambulatory Visit: Payer: Self-pay | Admitting: Family Medicine

## 2017-09-18 ENCOUNTER — Encounter: Payer: Self-pay | Admitting: Gastroenterology

## 2017-09-18 ENCOUNTER — Other Ambulatory Visit: Payer: Self-pay

## 2017-09-18 ENCOUNTER — Ambulatory Visit: Payer: 59 | Admitting: Gastroenterology

## 2017-09-18 VITALS — BP 115/73 | HR 77 | Ht 66.0 in | Wt 171.8 lb

## 2017-09-18 DIAGNOSIS — R1310 Dysphagia, unspecified: Secondary | ICD-10-CM

## 2017-09-18 DIAGNOSIS — K219 Gastro-esophageal reflux disease without esophagitis: Secondary | ICD-10-CM

## 2017-09-18 DIAGNOSIS — E559 Vitamin D deficiency, unspecified: Secondary | ICD-10-CM

## 2017-09-18 MED ORDER — OMEPRAZOLE 40 MG PO CPDR: 40.0000 mg | DELAYED_RELEASE_CAPSULE | Freq: Every day | ORAL | 2 refills | Status: DC

## 2017-09-18 MED ORDER — OMEPRAZOLE 40 MG PO CPDR: 40.0000 mg | DELAYED_RELEASE_CAPSULE | Freq: Every day | ORAL | 0 refills | Status: DC

## 2017-09-18 NOTE — Addendum Note (Signed)
Addended by: Peggye Ley on: 09/18/2017 02:44 PM   Modules accepted: Orders, SmartSet

## 2017-09-18 NOTE — Progress Notes (Signed)
Jonathon Bellows MD, MRCP(U.K) [address]  [place]  [place], [place] [address]  Main: [phone]  Fax: [phone]   Gastroenterology Consultation  Referring Provider:     Fredderick Severance, NP Primary Care Physician:  [name], MD Primary Gastroenterologist:  Dr. Jonathon Bellows  Reason for Consultation:     Dysphagia/dyspepsia        HPI:   Tammy Garza is a 37 y.o. y/o female referred for consultation & management  by Dr. Ancil Boozer, Drue Stager, MD.    She has been referred for dyspepsia. H pylori breath test was negative. H/o H pylori in the past . Recent ER visit in 06/2017 for epigastric pain. She is on Eloquis for protein C deficiency. She has alspo had some issues with food getting stuck .    Dysphagia: Onset and any progression: some months back - had some issues with reflux, had a lot of vomiting , was belching sulphur- these symptoms reoslved and since has had issues with reflux.  Frequency: daily any time she tries to eat  Foods affected : solids and liquids  Prior episodes of impaction: yes foods such as meat gets stuck  History of asthma/allergy : no  History of heartburn/Reflux : yes  Weight loss/weight gain : gained some weight   Prior EGD: yes and 3 years back - says she was told she had H pylori  PPI/H2 blocker use : no   Presently no abdominal pain , some nausea.   Past Medical History:  Diagnosis Date  . Anemia    h/o  . Cervical high risk human papillomavirus (HPV) DNA test positive   . Chronic gastritis   . Depression   . DVT (deep venous thrombosis) ([place]) 09/2016   left leg  . H. pylori infection   . History of attempted suicide   . History of gastric polyp   . History of kidney stones    h/o  . Migraines   . Protein C deficiency ([place])   . Rash   . Shift work sleep disorder     Past Surgical History:  Procedure Laterality Date  . ABDOMINAL HYSTERECTOMY    . CHOLECYSTECTOMY    . CYSTOSCOPY  12/19/2016   Procedure: CYSTOSCOPY;   Surgeon: Gae Dry, MD;  Location: [facility] ORS;  Service: Gynecology;;  . LAPAROSCOPIC HYSTERECTOMY Bilateral 12/19/2016   Procedure: HYSTERECTOMY TOTAL LAPAROSCOPIC BILATERAL SALPINGECTOMY;  Surgeon: Gae Dry, MD;  Location: [facility] ORS;  Service: Gynecology;  Laterality: Bilateral;  . TUBAL LIGATION      Prior to Admission medications   Medication Sig Start Date End Date Taking? Authorizing Provider  chlorproMAZINE (THORAZINE) 25 MG tablet TAKE 1-2TABS AS NEEDED FOR HEADACHE RESCUE MAY REPEAT IN 2-3HOURS MAX 4 TABS/24 HOURS 05/10/17   Orie Rout, MD  ELIQUIS 5 MG TABS tablet TAKE 1 TABLET BY MOUTH TWICE A DAY 09/10/17   Lequita Asal, MD  gabapentin (NEURONTIN) 100 MG capsule Take 300 mg by mouth daily. 05/10/17   Orie Rout, MD  gabapentin (NEURONTIN) 300 MG capsule Take by mouth daily. 09/11/17   [provider]  glycopyrrolate (ROBINUL) 1 MG tablet Take 3 mg by mouth daily. 01/29/17   Dasher, Rayvon Char, MD  Lorcaserin HCl ER (BELVIQ XR) 20 MG TB24 Take 20 mg by mouth daily. 08/20/17   Poulose, Bethel Born, NP  NOVOFINE PLUS 32G X 4 MM MISC See admin instructions. 06/07/17   [provider]  omeprazole (PRILOSEC) 40 MG capsule Take 1  capsule (40 mg total) by mouth daily. 07/12/17 07/12/18  Eula Listen, MD  ondansetron (ZOFRAN ODT) 4 MG disintegrating tablet Take 1 tablet (4 mg total) by mouth every 8 (eight) hours as needed for nausea or vomiting. 07/12/17   Eula Listen, MD  SAXENDA 18 MG/3ML SOPN INJECT 2.8 MG INTO THE SKIN DAILY. 06/07/17   [provider]  Vitamin D, Ergocalciferol, (DRISDOL) 50000 units CAPS capsule TAKE 1 CAPSULE (50,000 UNITS TOTAL) BY MOUTH EVERY 7 (SEVEN) DAYS. 06/27/17   [name], MD    Family History  Problem Relation Age of Onset  . HIV Sister   . Cancer Sister 29       cervical cancer  . Asthma Daughter   . Protein C deficiency Daughter   . Hypertension Maternal Grandmother   .  Hypertension Mother   . Obesity Mother   . Cancer Father 69       lung cancer  . Deep vein thrombosis Father   . Protein C deficiency Father   . COPD Father   . Diabetes Brother   . COPD Paternal Grandmother   . Deep vein thrombosis Paternal Grandmother   . Protein C deficiency Daughter      Social History   Tobacco Use  . Smoking status: Former Smoker    Packs/day: 0.25    Years: 5.00    Pack years: 1.25    Types: Cigarettes    Start date: 05/15/2002    Last attempt to quit: 05/16/2007    Years since quitting: 10.3  . Smokeless tobacco: Never Used  Substance Use Topics  . Alcohol use: Yes    Alcohol/week: 0.6 oz    Types: 1 Glasses of wine per week    Comment: occ wine  . Drug use: No    Allergies as of 09/18/2017  . (No Known Allergies)    Review of Systems:    All systems reviewed and negative except where noted in HPI.   Physical Exam:  LMP 01/04/2016  Patient's last menstrual period was 01/04/2016. Psych:  Alert and cooperative. Normal mood and affect. General:   Alert,  Well-developed, well-nourished, pleasant and cooperative in NAD Head:  Normocephalic and atraumatic. Eyes:  Sclera clear, no icterus.   Conjunctiva pink. Ears:  Normal auditory acuity. Nose:  No deformity, discharge, or lesions. Mouth:  No deformity or lesions,oropharynx pink & moist. Neck:  Supple; no masses or thyromegaly. Lungs:  Respirations even and unlabored.  Clear throughout to auscultation.   No wheezes, crackles, or rhonchi. No acute distress. Heart:  Regular rate and rhythm; no murmurs, clicks, rubs, or gallops. Abdomen:  Normal bowel sounds.  No bruits.  Soft, non-tender and non-distended without masses, hepatosplenomegaly or hernias noted.  No guarding or rebound tenderness.    Neurologic:  Alert and oriented x3;  grossly normal neurologically. Skin:  Intact without significant lesions or rashes. No jaundice. Lymph Nodes:  No significant cervical adenopathy. Psych:  Alert and  cooperative. Normal mood and affect.  Imaging Studies: No results found.  Assessment and Plan:   Tammy Garza is a 37 y.o. y/o female has been referred for Dysphagia/dyspepsia. Her history is suggestive of underlying reflux.   Plan  1. GERD patient information  2. EGD+/-  Dilation : blood thinner holding instructions from Dr Mike Gip  3. Prilosec 40 mg once a day first thing in the morning and eat 30 mins after  I have discussed alternative options, risks & benefits,  which include, but are not limited  to, bleeding, infection, perforation,respiratory complication & drug reaction.  The patient agrees with this plan & written consent will be obtained.      Follow up in 4 months  Dr Jonathon Bellows MD,MRCP(U.K)

## 2017-09-20 ENCOUNTER — Telehealth: Payer: Self-pay

## 2017-09-20 NOTE — Telephone Encounter (Signed)
Clearance received from Dr. Mike Gip for Eliquis hold instructions.  Stop: 5/11 Restart: 5/15  Copy in chart.

## 2017-09-24 ENCOUNTER — Ambulatory Visit: Payer: 59 | Admitting: Family Medicine

## 2017-09-24 ENCOUNTER — Encounter: Payer: Self-pay | Admitting: *Deleted

## 2017-09-25 ENCOUNTER — Encounter: Payer: Self-pay | Admitting: Gastroenterology

## 2017-09-25 ENCOUNTER — Ambulatory Visit: Payer: 59 | Admitting: Certified Registered"

## 2017-09-25 ENCOUNTER — Encounter
Admission: RE | Disposition: A | Discharge status: Home / Self Care | Payer: Self-pay | Source: Ambulatory Visit | Attending: Gastroenterology

## 2017-09-25 ENCOUNTER — Ambulatory Visit
Admission: RE | Admit: 2017-09-25 | Discharge: 2017-09-25 | Disposition: A | Discharge status: Home / Self Care | Payer: 59 | Source: Ambulatory Visit | Attending: Gastroenterology | Admitting: Gastroenterology

## 2017-09-25 DIAGNOSIS — Z7984 Long term (current) use of oral hypoglycemic drugs: Secondary | ICD-10-CM | POA: Insufficient documentation

## 2017-09-25 DIAGNOSIS — R1310 Dysphagia, unspecified: Secondary | ICD-10-CM | POA: Insufficient documentation

## 2017-09-25 DIAGNOSIS — Z79899 Other long term (current) drug therapy: Secondary | ICD-10-CM | POA: Diagnosis not present

## 2017-09-25 DIAGNOSIS — Z86718 Personal history of other venous thrombosis and embolism: Secondary | ICD-10-CM | POA: Diagnosis not present

## 2017-09-25 DIAGNOSIS — Z87891 Personal history of nicotine dependence: Secondary | ICD-10-CM | POA: Insufficient documentation

## 2017-09-25 DIAGNOSIS — Z7901 Long term (current) use of anticoagulants: Secondary | ICD-10-CM | POA: Diagnosis not present

## 2017-09-25 DIAGNOSIS — I739 Peripheral vascular disease, unspecified: Secondary | ICD-10-CM | POA: Insufficient documentation

## 2017-09-25 DIAGNOSIS — K219 Gastro-esophageal reflux disease without esophagitis: Secondary | ICD-10-CM

## 2017-09-25 HISTORY — DX: Gastro-esophageal reflux disease without esophagitis: K21.9

## 2017-09-25 HISTORY — PX: ESOPHAGOGASTRODUODENOSCOPY (EGD) WITH PROPOFOL: SHX5813

## 2017-09-25 SURGERY — ESOPHAGOGASTRODUODENOSCOPY (EGD) WITH PROPOFOL
Anesthesia: General

## 2017-09-25 MED ORDER — LIDOCAINE HCL (CARDIAC) PF 100 MG/5ML IV SOSY
PREFILLED_SYRINGE | INTRAVENOUS | Status: DC | PRN
  Administered 2017-09-25: 100 mg via INTRAVENOUS

## 2017-09-25 MED ORDER — LIDOCAINE HCL (PF) 2 % IJ SOLN
INTRAMUSCULAR | Status: AC
  Filled 2017-09-25: qty 10

## 2017-09-25 MED ORDER — PROPOFOL 500 MG/50ML IV EMUL
INTRAVENOUS | Status: DC | PRN
  Administered 2017-09-25: 150 ug/kg/min via INTRAVENOUS

## 2017-09-25 MED ORDER — MIDAZOLAM HCL 2 MG/2ML IJ SOLN
INTRAMUSCULAR | Status: DC | PRN
  Administered 2017-09-25: 2 mg via INTRAVENOUS

## 2017-09-25 MED ORDER — SODIUM CHLORIDE 0.9 % IV SOLN
INTRAVENOUS | Status: DC
  Administered 2017-09-25: 11:00:00 via INTRAVENOUS

## 2017-09-25 MED ORDER — MIDAZOLAM HCL 2 MG/2ML IJ SOLN
INTRAMUSCULAR | Status: AC
  Filled 2017-09-25: qty 2

## 2017-09-25 MED ORDER — PROPOFOL 10 MG/ML IV BOLUS
INTRAVENOUS | Status: DC | PRN
  Administered 2017-09-25: 20 mg via INTRAVENOUS
  Administered 2017-09-25: 80 mg via INTRAVENOUS

## 2017-09-25 NOTE — Anesthesia Procedure Notes (Signed)
Performed by: Sharee Sturdy, CRNA Pre-anesthesia Checklist: Patient identified, Emergency Drugs available, Suction available, Patient being monitored and Timeout performed Patient Re-evaluated:Patient Re-evaluated prior to induction Oxygen Delivery Method: Nasal cannula Induction Type: IV induction       

## 2017-09-25 NOTE — Transfer of Care (Signed)
Immediate Anesthesia Transfer of Care Note  Patient: Tammy Garza  Procedure(s) Performed: ESOPHAGOGASTRODUODENOSCOPY (EGD) WITH PROPOFOL (N/A )  Patient Location: PACU  Anesthesia Type:General  Level of Consciousness: sedated and responds to stimulation  Airway & Oxygen Therapy: Patient Spontanous Breathing and Patient connected to nasal cannula oxygen  Post-op Assessment: Report given to RN and Post -op Vital signs reviewed and stable  Post vital signs: Reviewed and stable  Last Vitals:  Vitals Value Taken Time  BP 101/59 09/25/2017 11:32 AM  Temp    Pulse 94 09/25/2017 11:32 AM  Resp 17 09/25/2017 11:32 AM  SpO2 99 % 09/25/2017 11:32 AM    Last Pain:  Vitals:   09/25/17 1110  TempSrc: Tympanic  PainSc: 0-No pain         Complications: No apparent anesthesia complications

## 2017-09-25 NOTE — Anesthesia Postprocedure Evaluation (Signed)
Anesthesia Post Note  Patient: Tammy Garza  Procedure(s) Performed: ESOPHAGOGASTRODUODENOSCOPY (EGD) WITH PROPOFOL (N/A )  Patient location during evaluation: Endoscopy Anesthesia Type: General Level of consciousness: awake and alert Pain management: pain level controlled Vital Signs Assessment: post-procedure vital signs reviewed and stable Respiratory status: spontaneous breathing, nonlabored ventilation, respiratory function stable and patient connected to nasal cannula oxygen Cardiovascular status: blood pressure returned to baseline and stable Postop Assessment: no apparent nausea or vomiting Anesthetic complications: no     Last Vitals:  Vitals:   09/25/17 1130 09/25/17 1132  BP: (!) 101/59 (!) 101/59  Pulse: 77 94  Resp: 16 17  Temp: (!) 36.1 C   SpO2: 100% 99%    Last Pain:  Vitals:   09/25/17 1152  TempSrc:   PainSc: 0-No pain                 Martha Clan

## 2017-09-25 NOTE — Anesthesia Post-op Follow-up Note (Signed)
Anesthesia QCDR form completed.        

## 2017-09-25 NOTE — Op Note (Signed)
[facility] Gastroenterology Patient Name: Tammy Garza Procedure Date: 09/25/2017 11:03 AM MRN: [id number] Account #: [id number] Date of Birth: 27-Sep-1980 Admit Type: Outpatient Age: 37 Room: [facility] ENDO ROOM 1 Gender: Female Note Status: Finalized Procedure:            Upper GI endoscopy Indications:          Dysphagia Providers:            Jonathon Bellows MD, MD Referring MD:         Bethena Roys. Sowles, MD (Referring MD) Medicines:            Monitored Anesthesia Care Complications:        No immediate complications. Procedure:            Pre-Anesthesia Assessment:                       - Prior to the procedure, a History and Physical was                        performed, and patient medications, allergies and                        sensitivities were reviewed. The patient's tolerance of                        previous anesthesia was reviewed.                       - The risks and benefits of the procedure and the                        sedation options and risks were discussed with the                        patient. All questions were answered and informed                        consent was obtained.                       - ASA Grade Assessment: II - A patient with mild                        systemic disease.                       After obtaining informed consent, the endoscope was                        passed under direct vision. Throughout the procedure,                        the patient's blood pressure, pulse, and oxygen                        saturations were monitored continuously. The Endoscope                        was introduced through the mouth, and advanced to the  third part of duodenum. The upper GI endoscopy was                        accomplished with ease. The patient tolerated the                        procedure well. Findings:      The examined duodenum was normal.      The stomach was normal.      The examined  esophagus was normal. Biopsies were taken with a cold       forceps for histology.      The cardia and gastric fundus were normal on retroflexion. Impression:           - Normal examined duodenum.                       - Normal stomach.                       - Normal esophagus. Biopsied. Recommendation:       - Discharge patient to home (with escort).                       - Resume previous diet.                       - Resume Xarelto (rivaroxaban) at prior dose today.                       - Use Prilosec (omeprazole) 40 mg PO daily for 12 weeks.                       - Await pathology results.                       - Return to my office in 6 weeks. Procedure Code(s):    --- Professional ---                       [phone], Esophagogastroduodenoscopy, flexible, transoral;                        with biopsy, single or multiple Diagnosis Code(s):    --- Professional ---                       R13.10, Dysphagia, unspecified CPT copyright 2017 American Medical Association. All rights reserved. The codes documented in this report are preliminary and upon coder review may  be revised to meet current compliance requirements. Jonathon Bellows, MD Jonathon Bellows MD, MD 09/25/2017 11:27:11 AM This report has been signed electronically. Number of Addenda: 0 Note Initiated On: 09/25/2017 11:03 AM      [facility]

## 2017-09-25 NOTE — Anesthesia Preprocedure Evaluation (Signed)
Anesthesia Evaluation  Patient identified by MRN, date of birth, ID band Patient awake    Reviewed: Allergy & Precautions, H&P , NPO status , Patient's Chart, lab work & pertinent test results  History of Anesthesia Complications Negative for: history of anesthetic complications  Airway Mallampati: II  TM Distance: >3 FB Neck ROM: full    Dental  (+) Teeth Intact, Dental Advidsory Given   Pulmonary neg pulmonary ROS, neg shortness of breath, former smoker,           Cardiovascular Exercise Tolerance: Good (-) hypertension(-) angina+ Peripheral Vascular Disease and + DVT  (-) Past MI and (-) DOE (-) dysrhythmias (-) Valvular Problems/Murmurs     Neuro/Psych  Headaches, neg Seizures PSYCHIATRIC DISORDERS Depression    GI/Hepatic Neg liver ROS, GERD  ,  Endo/Other  negative endocrine ROS  Renal/GU      Musculoskeletal   Abdominal   Peds  Hematology negative hematology ROS (+)   Anesthesia Other Findings Past Medical History: No date: Anemia     Comment:  h/o No date: Cervical high risk human papillomavirus (HPV) DNA test  positive No date: Chronic gastritis No date: Depression 09/2016: DVT (deep venous thrombosis) (HCC)     Comment:  left leg No date: H. pylori infection No date: History of attempted suicide No date: History of gastric polyp No date: History of kidney stones     Comment:  h/o No date: Migraines No date: Protein C deficiency (HCC) No date: Rash No date: Shift work sleep disorder  Past Surgical History: No date: CHOLECYSTECTOMY No date: TUBAL LIGATION  BMI    Body Mass Index:  28.08 kg/m      Reproductive/Obstetrics negative OB ROS                             Anesthesia Physical  Anesthesia Plan  ASA: II  Anesthesia Plan: General   Post-op Pain Management:    Induction: Intravenous  PONV Risk Score and Plan: 4 or greater and Dexamethasone and  Propofol infusion  Airway Management Planned: Nasal Cannula  Additional Equipment:   Intra-op Plan:   Post-operative Plan:   Informed Consent: I have reviewed the patients History and Physical, chart, labs and discussed the procedure including the risks, benefits and alternatives for the proposed anesthesia with the patient or authorized representative who has indicated his/her understanding and acceptance.   Dental Advisory Given  Plan Discussed with: Anesthesiologist, CRNA and Surgeon  Anesthesia Plan Comments: (Patient consented for risks of anesthesia including but not limited to:  - adverse reactions to medications - damage to teeth, lips or other oral mucosa - sore throat or hoarseness - Damage to heart, brain, lungs or loss of life  Patient voiced understanding.)        Anesthesia Quick Evaluation

## 2017-09-25 NOTE — H&P (Signed)
Jonathon Bellows, MD [address] [place], [place], [place], [address] [address], [place], [place], [place], [address] Phone: [phone]  Fax: [phone]  Primary Care Physician:  [name], MD   Pre-Procedure History & Physical: HPI:  Tammy Garza is a 37 y.o. female is here for an endoscopy    Past Medical History:  Diagnosis Date  . Anemia    h/o  . Cervical high risk human papillomavirus (HPV) DNA test positive   . Chronic gastritis   . Depression   . DVT (deep venous thrombosis) ([place]) 09/2016   left leg  . H. pylori infection   . History of attempted suicide   . History of gastric polyp   . History of kidney stones    h/o  . Migraines   . Protein C deficiency ([place])   . Rash   . Shift work sleep disorder     Past Surgical History:  Procedure Laterality Date  . ABDOMINAL HYSTERECTOMY    . CHOLECYSTECTOMY    . CYSTOSCOPY  12/19/2016   Procedure: CYSTOSCOPY;  Surgeon: Gae Dry, MD;  Location: [facility] ORS;  Service: Gynecology;;  . LAPAROSCOPIC HYSTERECTOMY Bilateral 12/19/2016   Procedure: HYSTERECTOMY TOTAL LAPAROSCOPIC BILATERAL SALPINGECTOMY;  Surgeon: Gae Dry, MD;  Location: [facility] ORS;  Service: Gynecology;  Laterality: Bilateral;  . TUBAL LIGATION      Prior to Admission medications   Medication Sig Start Date End Date Taking? Authorizing Provider  chlorproMAZINE (THORAZINE) 25 MG tablet TAKE 1-2TABS AS NEEDED FOR HEADACHE RESCUE MAY REPEAT IN 2-3HOURS MAX 4 TABS/24 HOURS 05/10/17   Orie Rout, MD  ELIQUIS 5 MG TABS tablet TAKE 1 TABLET BY MOUTH TWICE A DAY 09/10/17   Lequita Asal, MD  gabapentin (NEURONTIN) 100 MG capsule Take 300 mg by mouth daily. 05/10/17   Orie Rout, MD  gabapentin (NEURONTIN) 300 MG capsule Take by mouth daily. 09/11/17   [provider]  glycopyrrolate (ROBINUL) 1 MG tablet Take 3 mg by mouth daily. 01/29/17   Dasher, Rayvon Char, MD  Lorcaserin HCl ER (BELVIQ XR) 20 MG TB24 Take 20 mg  by mouth daily. Patient not taking: Reported on 09/18/2017 08/20/17   Fredderick Severance, NP  NOVOFINE PLUS 32G X 4 MM MISC See admin instructions. 06/07/17   [provider]  omeprazole (PRILOSEC) 40 MG capsule Take 1 capsule (40 mg total) by mouth daily. 09/18/17 12/17/17  Jonathon Bellows, MD  ondansetron (ZOFRAN ODT) 4 MG disintegrating tablet Take 1 tablet (4 mg total) by mouth every 8 (eight) hours as needed for nausea or vomiting. 07/12/17   Eula Listen, MD  SAXENDA 18 MG/3ML SOPN INJECT 2.8 MG INTO THE SKIN DAILY. 06/07/17   [provider]  Vitamin D, Ergocalciferol, (DRISDOL) 50000 units CAPS capsule TAKE 1 CAPSULE (50,000 UNITS TOTAL) BY MOUTH EVERY 7 (SEVEN) DAYS. 09/19/17   [name], MD    Allergies as of 09/18/2017  . (No Known Allergies)    Family History  Problem Relation Age of Onset  . HIV Sister   . Cancer Sister 22       cervical cancer  . Asthma Daughter   . Protein C deficiency Daughter   . Hypertension Maternal Grandmother   . Hypertension Mother   . Obesity Mother   . Cancer Father 50       lung cancer  . Deep vein thrombosis Father   . Protein C deficiency Father   . COPD Father   .  Diabetes Brother   . COPD Paternal Grandmother   . Deep vein thrombosis Paternal Grandmother   . Protein C deficiency Daughter     Social History   Socioeconomic History  . Marital status: Divorced    Spouse name: Not on file  . Number of children: 2  . Years of education: 10  . Highest education level: Associate degree: occupational, Hotel manager, or vocational program  Occupational History  . Not on file  Social Needs  . Financial resource strain: Somewhat hard  . Food insecurity:    Worry: Sometimes true    Inability: Sometimes true  . Transportation needs:    Medical: No    Non-medical: No  Tobacco Use  . Smoking status: Former Smoker    Packs/day: 0.25    Years: 5.00    Pack years: 1.25    Types: Cigarettes    Start date: 05/15/2002     Last attempt to quit: 05/16/2007    Years since quitting: 10.3  . Smokeless tobacco: Never Used  Substance and Sexual Activity  . Alcohol use: Yes    Alcohol/week: 0.6 oz    Types: 1 Glasses of wine per week    Comment: occ wine  . Drug use: No  . Sexual activity: Yes    Partners: Male    Birth control/protection: Surgical    Comment: hysterectomy   Lifestyle  . Physical activity:    Days per week: 3 days    Minutes per session: 60 min  . Stress: Not at all  Relationships  . Social connections:    Talks on phone: More than three times a week    Gets together: Once a week    Attends religious service: More than 4 times per year    Active member of club or organization: Yes    Attends meetings of clubs or organizations: 1 to 4 times per year    Relationship status: Divorced  . Intimate partner violence:    Fear of current or ex partner: No    Emotionally abused: No    Physically abused: No    Forced sexual activity: No  Other Topics Concern  . Not on file  Social History Narrative   Separated since June 2016 - she has two children at home . The youngest is his daughter. Working at [employer] in [place]. She works at [employer]. She had been laid off by back since 2017.    Dating Gwyndolyn Saxon since 2015    Review of Systems: See HPI, otherwise negative ROS  Physical Exam: LMP 01/04/2016  General:   Alert,  pleasant and cooperative in NAD Head:  Normocephalic and atraumatic. Neck:  Supple; no masses or thyromegaly. Lungs:  Clear throughout to auscultation, normal respiratory effort.    Heart:  +S1, +S2, Regular rate and rhythm, No edema. Abdomen:  Soft, nontender and nondistended. Normal bowel sounds, without guarding, and without rebound.   Neurologic:  Alert and  oriented x4;  grossly normal neurologically.  Impression/Plan: Tammy Garza is here for an endoscopy and possible dilation to be performed for  evaluation of dysphagia    Risks, benefits, limitations, and  alternatives regarding endoscopy have been reviewed with the patient.  Questions have been answered.  All parties agreeable.   Jonathon Bellows, MD  09/25/2017, 11:01 AM

## 2017-09-26 ENCOUNTER — Telehealth: Payer: Self-pay | Admitting: Gastroenterology

## 2017-09-26 ENCOUNTER — Encounter: Payer: Self-pay | Admitting: Gastroenterology

## 2017-09-26 LAB — SURGICAL PATHOLOGY

## 2017-09-26 NOTE — Telephone Encounter (Signed)
Pt left vm she states she had an EGD with Dr. Vicente Males yesterday and is having black stool please call pt

## 2017-09-26 NOTE — Telephone Encounter (Signed)
Advised patient of Dr. Georgeann Oppenheim response:   - Check with the patient if it persists, expect it to stop. If continues check hb. Ensure on ppi and discuss with available physician

## 2017-09-27 ENCOUNTER — Other Ambulatory Visit: Payer: Self-pay

## 2017-09-27 DIAGNOSIS — K219 Gastro-esophageal reflux disease without esophagitis: Secondary | ICD-10-CM

## 2017-09-27 DIAGNOSIS — R1310 Dysphagia, unspecified: Secondary | ICD-10-CM

## 2017-09-27 MED ORDER — OMEPRAZOLE 40 MG PO CPDR: 40.0000 mg | DELAYED_RELEASE_CAPSULE | Freq: Every day | ORAL | 2 refills | Status: DC

## 2017-10-01 ENCOUNTER — Inpatient Hospital Stay: Payer: 59

## 2017-10-01 ENCOUNTER — Inpatient Hospital Stay: Payer: 59 | Admitting: Hematology and Oncology

## 2017-10-04 ENCOUNTER — Other Ambulatory Visit: Payer: 59

## 2017-10-05 ENCOUNTER — Other Ambulatory Visit: Payer: Self-pay

## 2017-10-05 ENCOUNTER — Other Ambulatory Visit: Payer: 59

## 2017-10-05 ENCOUNTER — Inpatient Hospital Stay: Payer: 59 | Attending: Hematology and Oncology

## 2017-10-05 ENCOUNTER — Inpatient Hospital Stay (HOSPITAL_BASED_OUTPATIENT_CLINIC_OR_DEPARTMENT_OTHER): Payer: 59 | Admitting: Hematology and Oncology

## 2017-10-05 ENCOUNTER — Other Ambulatory Visit: Payer: Self-pay | Admitting: Hematology and Oncology

## 2017-10-05 ENCOUNTER — Encounter: Payer: Self-pay | Admitting: Hematology and Oncology

## 2017-10-05 ENCOUNTER — Ambulatory Visit: Payer: 59 | Admitting: Hematology and Oncology

## 2017-10-05 VITALS — BP 111/73 | HR 80 | Temp 97.2°F | Resp 18 | Ht 66.0 in | Wt 164.9 lb

## 2017-10-05 DIAGNOSIS — I825Z2 Chronic embolism and thrombosis of unspecified deep veins of left distal lower extremity: Secondary | ICD-10-CM

## 2017-10-05 DIAGNOSIS — D472 Monoclonal gammopathy: Secondary | ICD-10-CM | POA: Insufficient documentation

## 2017-10-05 DIAGNOSIS — Z7901 Long term (current) use of anticoagulants: Secondary | ICD-10-CM

## 2017-10-05 DIAGNOSIS — Z86718 Personal history of other venous thrombosis and embolism: Secondary | ICD-10-CM

## 2017-10-05 DIAGNOSIS — Z79899 Other long term (current) drug therapy: Secondary | ICD-10-CM

## 2017-10-05 DIAGNOSIS — I824Z9 Acute embolism and thrombosis of unspecified deep veins of unspecified distal lower extremity: Secondary | ICD-10-CM

## 2017-10-05 DIAGNOSIS — Z87891 Personal history of nicotine dependence: Secondary | ICD-10-CM | POA: Insufficient documentation

## 2017-10-05 DIAGNOSIS — D6859 Other primary thrombophilia: Secondary | ICD-10-CM

## 2017-10-05 DIAGNOSIS — Z8249 Family history of ischemic heart disease and other diseases of the circulatory system: Secondary | ICD-10-CM

## 2017-10-05 LAB — CBC WITH DIFFERENTIAL/PLATELET
Basophils Absolute: 0 10*3/uL (ref 0–0.1)
Basophils Relative: 1 %
Eosinophils Absolute: 0.1 10*3/uL (ref 0–0.7)
Eosinophils Relative: 2 %
HCT: 38 % (ref 35.0–47.0)
Hemoglobin: 12.8 g/dL (ref 12.0–16.0)
Lymphocytes Relative: 48 %
Lymphs Abs: 3.1 10*3/uL (ref 1.0–3.6)
MCH: 30.9 pg (ref 26.0–34.0)
MCHC: 33.7 g/dL (ref 32.0–36.0)
MCV: 91.6 fL (ref 80.0–100.0)
Monocytes Absolute: 0.6 10*3/uL (ref 0.2–0.9)
Monocytes Relative: 10 %
Neutro Abs: 2.5 10*3/uL (ref 1.4–6.5)
Neutrophils Relative %: 39 %
Platelets: 220 10*3/uL (ref 150–440)
RBC: 4.15 MIL/uL (ref 3.80–5.20)
RDW: 12.5 % (ref 11.5–14.5)
WBC: 6.4 10*3/uL (ref 3.6–11.0)

## 2017-10-05 LAB — COMPREHENSIVE METABOLIC PANEL
ALT: 14 U/L (ref 14–54)
AST: 19 U/L (ref 15–41)
Albumin: 4.7 g/dL (ref 3.5–5.0)
Alkaline Phosphatase: 43 U/L (ref 38–126)
Anion gap: 9 (ref 5–15)
BUN: 11 mg/dL (ref 6–20)
CO2: 24 mmol/L (ref 22–32)
Calcium: 9 mg/dL (ref 8.9–10.3)
Chloride: 104 mmol/L (ref 101–111)
Creatinine, Ser: 0.88 mg/dL (ref 0.44–1.00)
GFR calc Af Amer: >60 mL/min (ref >60)
GFR calc non Af Amer: >60 mL/min (ref >60)
Glucose, Bld: 87 mg/dL (ref 65–99)
Potassium: 3.8 mmol/L (ref 3.5–5.1)
Sodium: 137 mmol/L (ref 135–145)
Total Bilirubin: 1.1 mg/dL (ref 0.3–1.2)
Total Protein: 7.9 g/dL (ref 6.5–8.1)

## 2017-10-05 NOTE — Progress Notes (Signed)
No new changes noted today 

## 2017-10-05 NOTE — Progress Notes (Signed)
[facility] day:  10/05/2017   Chief Complaint: Tammy Garza is a 37 y.o. female with a DVT of the left lower extremity and protein C deficiency on Eliquis who is seen for 3 month assessment.  HPI:  The patient was last seen in the hematology clinic on 07/06/2017.  At that time, she was recovering from left ACL reconstruction.   She was on Eliquis.  Exam revealed no adenopathy or hepatosplenomegaly.  Patient underwent EGD on 09/25/2017 for dysphagia and increased reflux.  Exam was normal. Pathology was negative for high grade dysplasia or malignancy. Patient was started on PPI (omeprazole) therapy.   During the interim, patient has been doing "pretty good". She has no significant complaints. She is healing well following her knee surgery. AROM is still not back to baseline, however patient denies limitations in function. She notes that she is able to ambulate without difficult.   She is eating well. Weight is up 7 pounds. Patient denies pain in the clinic today.    Past Medical History:  Diagnosis Date  . Anemia    h/o  . Cervical high risk human papillomavirus (HPV) DNA test positive   . Chronic gastritis   . Depression   . DVT (deep venous thrombosis) ([place]) 09/2016   left leg  . GERD (gastroesophageal reflux disease)   . H. pylori infection   . History of attempted suicide   . History of gastric polyp   . History of kidney stones    h/o  . Migraines   . Protein C deficiency ([place])   . Rash   . Shift work sleep disorder     Past Surgical History:  Procedure Laterality Date  . ABDOMINAL HYSTERECTOMY    . ANTERIOR CRUCIATE LIGAMENT REPAIR Left   . CHOLECYSTECTOMY    . CYSTOSCOPY  12/19/2016   Procedure: CYSTOSCOPY;  Surgeon: Gae Dry, MD;  Location: [facility] ORS;  Service: Gynecology;;  . ESOPHAGOGASTRODUODENOSCOPY (EGD) WITH PROPOFOL N/A 09/25/2017   Procedure: ESOPHAGOGASTRODUODENOSCOPY (EGD) WITH PROPOFOL;  Surgeon: Jonathon Bellows, MD;  Location: [facility] ENDOSCOPY;  Service: Gastroenterology;  Laterality: N/A;  . LAPAROSCOPIC HYSTERECTOMY Bilateral 12/19/2016   Procedure: HYSTERECTOMY TOTAL LAPAROSCOPIC BILATERAL SALPINGECTOMY;  Surgeon: Gae Dry, MD;  Location: [facility] ORS;  Service: Gynecology;  Laterality: Bilateral;  . TUBAL LIGATION      Family History  Problem Relation Age of Onset  . HIV Sister   . Cancer Sister 23       cervical cancer  . Asthma Daughter   . Protein C deficiency Daughter   . Hypertension Maternal Grandmother   . Hypertension Mother   . Obesity Mother   . Cancer Father 30       lung cancer  . Deep vein thrombosis Father   . Protein C deficiency Father   . COPD Father   . Diabetes Brother   . COPD Paternal Grandmother   . Deep vein thrombosis Paternal Grandmother   . Protein C deficiency Daughter     Social History:  reports that she quit smoking about 10 years ago. Her smoking use included cigarettes. She started smoking about 15 years ago. She has a 1.25 pack-year smoking history. She has never used smokeless tobacco. She reports that she drinks about 0.6 oz of alcohol per week. She reports that she does not use drugs.  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  She wears a mask at  work.  She has 2 daughters (ages 45 and 33).  The patient is alone today.  Allergies: No Known Allergies  Current Medications: Current Outpatient Medications  Medication Sig Dispense Refill  . chlorproMAZINE (THORAZINE) 25 MG tablet TAKE 1-2TABS AS NEEDED FOR HEADACHE RESCUE MAY REPEAT IN 2-3HOURS MAX 4 TABS/24 HOURS  1  . ELIQUIS 5 MG TABS tablet TAKE 1 TABLET BY MOUTH TWICE A DAY 70 tablet 0  . gabapentin (NEURONTIN) 100 MG capsule Take 300 mg by mouth daily.  2  . gabapentin (NEURONTIN) 300 MG capsule Take by mouth daily.  2  . glycopyrrolate (ROBINUL) 1 MG tablet Take 3 mg by mouth daily.  2  . Lorcaserin HCl ER (BELVIQ XR) 20 MG TB24 Take 20 mg by mouth daily.  30 tablet 3  . NOVOFINE PLUS 32G X 4 MM MISC See admin instructions.  1  . omeprazole (PRILOSEC) 40 MG capsule Take 1 capsule (40 mg total) by mouth daily. 30 capsule 2  . SAXENDA 18 MG/3ML SOPN INJECT 2.8 MG INTO THE SKIN DAILY.  3  . Vitamin D, Ergocalciferol, (DRISDOL) 50000 units CAPS capsule TAKE 1 CAPSULE (50,000 UNITS TOTAL) BY MOUTH EVERY 7 (SEVEN) DAYS. 12 capsule 0  . ondansetron (ZOFRAN ODT) 4 MG disintegrating tablet Take 1 tablet (4 mg total) by mouth every 8 (eight) hours as needed for nausea or vomiting. (Patient not taking: Reported on 10/05/2017) 10 tablet 0   No current facility-administered medications for this visit.     Review of Systems  Constitutional: Negative for diaphoresis, fever, malaise/fatigue and weight loss (up 7 pounds).  HENT: Negative.   Eyes: Negative.   Respiratory: Negative for cough, hemoptysis, sputum production and shortness of breath.   Cardiovascular: Negative for chest pain, palpitations, orthopnea, leg swelling and PND.  Gastrointestinal: Negative for abdominal pain, blood in stool, constipation, diarrhea, melena, nausea and vomiting.  Genitourinary: Negative for dysuria, frequency, hematuria and urgency.  Musculoskeletal: Negative for back pain, falls, joint pain and myalgias.       S/p knee surgery. AROM not back to baseline. No issues with ambulation.   Skin: Negative for itching and rash.  Neurological: Negative for dizziness, tremors, weakness and headaches.  Endo/Heme/Allergies: Does not bruise/bleed easily.  Psychiatric/Behavioral: Negative for depression, memory loss and suicidal ideas. The patient is not nervous/anxious and does not have insomnia.   All other systems reviewed and are negative.  Performance status (ECOG): 0 - Asymptomatic  Physical Exam: Blood pressure 111/73, pulse 80, temperature (!) 97.2 F (36.2 C), temperature source Tympanic, resp. rate 18, height _0  (1.676 m), weight 164 lb 14.4 oz (74.8 kg), last menstrual  period 01/04/2016, SpO2 99 %. GENERAL:  Well developed, well nourished, woman sitting comfortably in the exam room in no acute distress. MENTAL STATUS:  Alert and oriented to person, place and time. HEAD:  Short black hair.  Normocephalic, atraumatic, face symmetric, no Cushingoid features. EYES:  Brown eyes.  Pupils equal round and reactive to light and accomodation.  No conjunctivitis or scleral icterus. ENT:  Oropharynx clear without lesion.  Tongue normal. Mucous membranes moist.  RESPIRATORY:  Clear to auscultation without rales, wheezes or rhonchi. CARDIOVASCULAR:  Regular rate and rhythm without murmur, rub or gallop. ABDOMEN:  Soft, non-tender, with active bowel sounds, and no hepatosplenomegaly.  No masses. SKIN:  Tattoo.  No rashes, ulcers or lesions. EXTREMITIES:  No increased warmth left knee.  No edema, no skin discoloration or tenderness.  No palpable cords. LYMPH NODES: No  palpable cervical, supraclavicular, axillary or inguinal adenopathy  NEUROLOGICAL: Unremarkable. PSYCH:  Appropriate.    Orders Only on 10/05/2017  Component Date Value Ref Range Status  . Sodium 10/05/2017 137  135 - 145 mmol/L Final  . Potassium 10/05/2017 3.8  3.5 - 5.1 mmol/L Final  . Chloride 10/05/2017 104  101 - 111 mmol/L Final  . CO2 10/05/2017 24  22 - 32 mmol/L Final  . Glucose, Bld 10/05/2017 87  65 - 99 mg/dL Final  . BUN 10/05/2017 11  6 - 20 mg/dL Final  . Creatinine, Ser 10/05/2017 0.88  0.44 - 1.00 mg/dL Final  . Calcium 10/05/2017 9.0  8.9 - 10.3 mg/dL Final  . Total Protein 10/05/2017 7.9  6.5 - 8.1 g/dL Final  . Albumin 10/05/2017 4.7  3.5 - 5.0 g/dL Final  . AST 10/05/2017 19  15 - 41 U/L Final  . ALT 10/05/2017 14  14 - 54 U/L Final  . Alkaline Phosphatase 10/05/2017 43  38 - 126 U/L Final  . Total Bilirubin 10/05/2017 1.1  0.3 - 1.2 mg/dL Final  . GFR calc non Af Amer 10/05/2017 >60  >60 mL/min Final  . GFR calc Af Amer 10/05/2017 >60  >60 mL/min Final   Comment: (NOTE) The  eGFR has been calculated using the CKD EPI equation. This calculation has not been validated in all clinical situations. eGFR's persistently <60 mL/min signify possible Chronic Kidney Disease.   Georgiann Hahn gap 10/05/2017 9  5 - 15 Final   Performed at [facility], [address] [place], [place] [address]  . WBC 10/05/2017 6.4  3.6 - 11.0 K/uL Final  . RBC 10/05/2017 4.15  3.80 - 5.20 MIL/uL Final  . Hemoglobin 10/05/2017 12.8  12.0 - 16.0 g/dL Final  . HCT 10/05/2017 38.0  35.0 - 47.0 % Final  . MCV 10/05/2017 91.6  80.0 - 100.0 fL Final  . MCH 10/05/2017 30.9  26.0 - 34.0 pg Final  . MCHC 10/05/2017 33.7  32.0 - 36.0 g/dL Final  . RDW 10/05/2017 12.5  11.5 - 14.5 % Final  . Platelets 10/05/2017 220  150 - 440 K/uL Final  . Neutrophils Relative % 10/05/2017 39  % Final  . Neutro Abs 10/05/2017 2.5  1.4 - 6.5 K/uL Final  . Lymphocytes Relative 10/05/2017 48  % Final  . Lymphs Abs 10/05/2017 3.1  1.0 - 3.6 K/uL Final  . Monocytes Relative 10/05/2017 10  % Final  . Monocytes Absolute 10/05/2017 0.6  0.2 - 0.9 K/uL Final  . Eosinophils Relative 10/05/2017 2  % Final  . Eosinophils Absolute 10/05/2017 0.1  0 - 0.7 K/uL Final  . Basophils Relative 10/05/2017 1  % Final  . Basophils Absolute 10/05/2017 0.0  0 - 0.1 K/uL Final   Performed at [facility], [address] [place], [place] [address]    Assessment:  Tammy Garza is a 37 y.o. female with protein C deficiency and a left lower extremity DVT following a 2 1/2 hour flight.  She had been on DepoProvera x 1 year.  She has a family history of thrombosis.  Left lower extremity duplex on 09/17/2016 revealed an acute appearing thrombus in the calf veins of the left lower extremity (posterior tibial and peroneal veins).  Bilateral lower extremity duplex on 11/20/2016 revealed no evidence of acute DVT within either lower extremity. There was some residual nonocclusive thrombus in the left peroneal vein, sequela of previous DVT  on the prior scan.  Left  lower extremity duplex on 07/05/2017 revealed an isolated thrombus of the left peroneal vein in the calf similar to 11/20/2016.  There was no acute thrombus.  She is on Eliquis.    Hypercoagulable work-up on 09/26/2016 and 11/30/2016 revealed the following negative studies:  Factor V Leiden, prothrombin gene mutation, lupus anticoagulant panel, anticardiolipin antibodies, beta2-glycoprotein, protein S antigen/activity, and ATIII antigen/activity.  She has protein C deficiency.  Labs on 11/30/2016 revealed a protein C total of 40% (60 - 150%) and protein C activity of 49% (73-180%).  Repeat testing on 03/29/2017 revealed a protein C total of 51% and a protein C activity of 51%.  She has a monoclonal gammopathy of unknown significance.  SPEP on 09/26/2016 was negative.  Immunofixation revealed an IgA monoclonal protein with lambda light chain specificity.  Free light chain assay was normal.  IgG was 698 ([phone]).  IgA was 182 (87-352).  24 hour UPEP on 10/22/2016 revealed no monoclonal protein.  SPEP on 03/29/2017 was negative.  Chest, abdomen, and pelvic CT on 12/08/2016 revealed no lymphadenopathy or hepatosplenomegaly.  She underwent total laparoscopic hysterectomy with bilateral salpingectomy on 12/19/2016. She was off Eliquis x 3 days (2 days pre-op and day of surgery).  She developed a small superficial vein thrombus.  She underwent surgery (ACL reconstruction with autograft)  on 05/25/2017.  Left lower extremity duplex on 07/05/2017 revealed an isolated thrombus of the left peroneal vein in the calf.  As compared to 11/20/2016, the thrombus within the left peroneal vein appeared similar.  There was no acute thrombus.  Symptomatically, she feels well. She is recovering well from knee surgery. Exam reveals no adenopathy or hepatosplenomegaly.  Plan: 1.  Labs today:  CBC with diff, CMP, SPEP, FLCA. 2.  Continue Eliquis as previously prescribed.  3.  RTC in 3 months for  labs (CBC with diff, CMP). 4.  RTC in 6 months for MD assessment and labs (CBC with diff, CMP).    Honor Loh, NP 10/05/2017, 3:16 PM   I saw and evaluated the patient, participating in the key portions of the service and reviewing pertinent diagnostic studies and records.  I reviewed the nurse practitioner's note and agree with the findings and the plan.  The assessment and plan were discussed with the patient.  Several questions were asked by the patient and answered.   Nolon Stalls, MD 10/05/2017, 3:16 PM

## 2017-10-09 LAB — PROTEIN ELECTROPHORESIS, SERUM
A/G Ratio: 1.5 (ref 0.7–1.7)
Albumin ELP: 4.4 g/dL (ref 2.9–4.4)
Alpha-1-Globulin: 0.2 g/dL (ref 0.0–0.4)
Alpha-2-Globulin: 0.6 g/dL (ref 0.4–1.0)
Beta Globulin: 0.9 g/dL (ref 0.7–1.3)
Gamma Globulin: 1.3 g/dL (ref 0.4–1.8)
Globulin, Total: 3 g/dL (ref 2.2–3.9)
Total Protein ELP: 7.4 g/dL (ref 6.0–8.5)

## 2017-10-09 LAB — KAPPA/LAMBDA LIGHT CHAINS
Kappa free light chain: 16.7 mg/L (ref 3.3–19.4)
Kappa, lambda light chain ratio: 1.17 (ref 0.26–1.65)
Lambda free light chains: 14.3 mg/L (ref 5.7–26.3)

## 2017-10-11 ENCOUNTER — Other Ambulatory Visit: Payer: Self-pay | Admitting: Hematology and Oncology

## 2017-10-13 ENCOUNTER — Other Ambulatory Visit: Payer: Self-pay | Admitting: Hematology and Oncology

## 2017-10-13 MED ORDER — APIXABAN 5 MG PO TABS: 5.0000 mg | ORAL_TABLET | Freq: Two times a day (BID) | ORAL | 0 refills | Status: DC

## 2017-10-16 DIAGNOSIS — Z9889 Other specified postprocedural states: Secondary | ICD-10-CM | POA: Diagnosis not present

## 2017-10-16 DIAGNOSIS — M25561 Pain in right knee: Secondary | ICD-10-CM | POA: Diagnosis not present

## 2017-10-18 ENCOUNTER — Encounter: Payer: Self-pay | Admitting: Gastroenterology

## 2017-10-22 NOTE — Progress Notes (Deleted)
10/23/2017 8:22 AM   Tammy Garza [birth year] [id number]  Referring provider: [name], MD [address] [place], [place] [address]  No chief complaint on file.   HPI: Patient is a 37 -year-old [place] American female who is referred to [place] by Suezanne Cheshire, NP for feelings of incomplete bladder emptying.   She is having urinary frequency, urgency, dysuria, nocturia, intermittency, hesitancy, straining to urinate, incontinence and weak urinary stream.   ***  She does/does not have a history of urinary tract infections, STI's or injury to the bladder. ***  She denies/endorses dysuria, gross hematuria, suprapubic pain, back pain, abdominal pain or flank pain.***  She has not had any recent fevers, chills, nausea or vomiting. ***  She does/does not have a history of nephrolithiasis, GU surgery or GU trauma. ***  She is/is not sexually active.  She has/has not noted incontinence with sexual intercourse.  ***   She is post menopausal. ***  She admits to/denies constipation and/or diarrhea. ***  She is having/ not having pain with bladder filling.  ***  She has/not had any recent imaging studies.  ***  She is drinking *** of water daily.   She is drinking *** caffeinated beverages daily.  She is drinking *** alcoholic beverages daily.   She is a former smoker.   Referral notes reviewed: patient has had symptoms for the last 2 to 3 weeks  PMH: Past Medical History:  Diagnosis Date  . Anemia    h/o  . Cervical high risk human papillomavirus (HPV) DNA test positive   . Chronic gastritis   . Depression   . DVT (deep venous thrombosis) ([place]) 09/2016   left leg  . GERD (gastroesophageal reflux disease)   . H. pylori infection   . History of attempted suicide   . History of gastric polyp   . History of kidney stones    h/o  . Migraines   . Protein C deficiency ([place])   . Rash   . Shift work sleep disorder     Surgical History: Past Surgical  History:  Procedure Laterality Date  . ABDOMINAL HYSTERECTOMY    . ANTERIOR CRUCIATE LIGAMENT REPAIR Left   . CHOLECYSTECTOMY    . CYSTOSCOPY  12/19/2016   Procedure: CYSTOSCOPY;  Surgeon: Gae Dry, MD;  Location: [facility] ORS;  Service: Gynecology;;  . ESOPHAGOGASTRODUODENOSCOPY (EGD) WITH PROPOFOL N/A 09/25/2017   Procedure: ESOPHAGOGASTRODUODENOSCOPY (EGD) WITH PROPOFOL;  Surgeon: Jonathon Bellows, MD;  Location: [facility] ENDOSCOPY;  Service: Gastroenterology;  Laterality: N/A;  . LAPAROSCOPIC HYSTERECTOMY Bilateral 12/19/2016   Procedure: HYSTERECTOMY TOTAL LAPAROSCOPIC BILATERAL SALPINGECTOMY;  Surgeon: Gae Dry, MD;  Location: [facility] ORS;  Service: Gynecology;  Laterality: Bilateral;  . TUBAL LIGATION      Home Medications:  Allergies as of 10/23/2017   No Known Allergies     Medication List        Accurate as of 10/22/17  8:22 AM. Always use your most recent med list.          apixaban 5 MG Tabs tablet Commonly known as:  ELIQUIS Take 1 tablet (5 mg total) by mouth 2 (two) times daily.   chlorproMAZINE 25 MG tablet Commonly known as:  THORAZINE TAKE 1-2TABS AS NEEDED FOR HEADACHE RESCUE MAY REPEAT IN 2-3HOURS MAX 4 TABS/24 HOURS   gabapentin 100 MG capsule Commonly known as:  NEURONTIN Take 300 mg by mouth daily.   gabapentin 300 MG capsule Commonly known as:  NEURONTIN Take by mouth  daily.   glycopyrrolate 1 MG tablet Commonly known as:  ROBINUL Take 3 mg by mouth daily.   Lorcaserin HCl ER 20 MG Tb24 Commonly known as:  BELVIQ XR Take 20 mg by mouth daily.   NOVOFINE PLUS 32G X 4 MM Misc Generic drug:  Insulin Pen Needle See admin instructions.   omeprazole 40 MG capsule Commonly known as:  PRILOSEC Take 1 capsule (40 mg total) by mouth daily.   ondansetron 4 MG disintegrating tablet Commonly known as:  ZOFRAN ODT Take 1 tablet (4 mg total) by mouth every 8 (eight) hours as needed for nausea or vomiting.   SAXENDA 18 MG/3ML Sopn Generic drug:   Liraglutide -Weight Management INJECT 2.8 MG INTO THE SKIN DAILY.   Vitamin D (Ergocalciferol) 50000 units Caps capsule Commonly known as:  DRISDOL TAKE 1 CAPSULE (50,000 UNITS TOTAL) BY MOUTH EVERY 7 (SEVEN) DAYS.       Allergies: No Known Allergies  Family History: Family History  Problem Relation Age of Onset  . HIV Sister   . Cancer Sister 52       cervical cancer  . Asthma Daughter   . Protein C deficiency Daughter   . Hypertension Maternal Grandmother   . Hypertension Mother   . Obesity Mother   . Cancer Father 49       lung cancer  . Deep vein thrombosis Father   . Protein C deficiency Father   . COPD Father   . Diabetes Brother   . COPD Paternal Grandmother   . Deep vein thrombosis Paternal Grandmother   . Protein C deficiency Daughter     Social History:  reports that she quit smoking about 10 years ago. Her smoking use included cigarettes. She started smoking about 15 years ago. She has a 1.25 pack-year smoking history. She has never used smokeless tobacco. She reports that she drinks about 0.6 oz of alcohol per week. She reports that she does not use drugs.  ROS:                                        Physical Exam: LMP 01/04/2016   Constitutional: Well nourished. Alert and oriented, No acute distress. HEENT: [place] AT, moist mucus membranes. Trachea midline, no masses. Cardiovascular: No clubbing, cyanosis, or edema. Respiratory: Normal respiratory effort, no increased work of breathing. GI: Abdomen is soft, non tender, non distended, no abdominal masses. Liver and spleen not palpable.  No hernias appreciated.  Stool sample for occult testing is not indicated.   GU: No CVA tenderness.  No bladder fullness or masses.  Normal external genitalia, normal pubic hair distribution, no lesions.  Normal urethral meatus, no lesions, no prolapse, no discharge.   No urethral masses, tenderness and/or tenderness. No bladder fullness, tenderness or  masses. Normal vagina mucosa, good estrogen effect, no discharge, no lesions, good pelvic support, no cystocele or rectocele noted.  Cervix, uterus and adnexa are surgically absent.  Anus and perineum are without rashes or lesions.   *** Skin: No rashes, bruises or suspicious lesions. Lymph: No cervical or inguinal adenopathy. Neurologic: Grossly intact, no focal deficits, moving all 4 extremities. Psychiatric: Normal mood and affect.  Laboratory Data: Lab Results  Component Value Date   WBC 6.4 10/05/2017   HGB 12.8 10/05/2017   HCT 38.0 10/05/2017   MCV 91.6 10/05/2017   PLT 220 10/05/2017    Lab Results  Component Value Date   CREATININE 0.88 10/05/2017    No results found for: PSA  No results found for: TESTOSTERONE  Lab Results  Component Value Date   HGBA1C 4.8 05/17/2017    Lab Results  Component Value Date   TSH 1.60 01/11/2016       Component Value Date/Time   CHOL 152 07/26/2015 1244   HDL 70 07/26/2015 1244   CHOLHDL 2.2 07/26/2015 1244   LDLCALC 68 07/26/2015 1244    Lab Results  Component Value Date   AST 19 10/05/2017   Lab Results  Component Value Date   ALT 14 10/05/2017   No components found for: ALKALINEPHOPHATASE No components found for: BILIRUBINTOTAL  No results found for: ESTRADIOL  Urinalysis    Component Value Date/Time   COLORURINE YELLOW (A) 07/12/2017 1924   APPEARANCEUR HAZY (A) 07/12/2017 1924   APPEARANCEUR Hazy 04/17/2014 0045   LABSPEC 1.019 07/12/2017 1924   LABSPEC 1.024 04/17/2014 0045   PHURINE 6.0 07/12/2017 1924   GLUCOSEU NEGATIVE 07/12/2017 1924   GLUCOSEU Negative 04/17/2014 0045   HGBUR NEGATIVE 07/12/2017 1924   BILIRUBINUR NEGATIVE 07/12/2017 1924   BILIRUBINUR neg 04/22/2015 0940   BILIRUBINUR Negative 04/17/2014 0045   KETONESUR NEGATIVE 07/12/2017 1924   PROTEINUR NEGATIVE 07/12/2017 1924   UROBILINOGEN 0.2 04/22/2015 0940   UROBILINOGEN 0.2 07/03/2007 0021   NITRITE NEGATIVE 07/12/2017 1924    LEUKOCYTESUR NEGATIVE 07/12/2017 1924   LEUKOCYTESUR Negative 04/17/2014 0045    I have reviewed the labs.   Pertinent Imaging: *** I have independently reviewed the films.    Assessment & Plan:  ***  1. Feelings of incomplete bladder emptying Incontinence  - offered behavioral therapies  - bladder training  - bladder control strategies  - pelvic floor muscle training  - fluid management   - offered medical therapy with anticholinergic therapy or beta-3 adrenergic receptor agonist and the potential side effects of each therapy ***  - offered refer to gynecology for a pessary fitting ***  - offered an appointment with one of our surgeon for a possible pelvic sling procedure ***  - would like to try the beta-3 adrenergic receptor agonist (Myrbetriq).  Given Myrbetriq 25 mg samples, #28.  I have reviewed with the patient of the side effects of Myrbetriq, such as: elevation in BP, urinary retention and/or HA.  She will return in one month for PVR and symptom recheck.  ***  - would like to try anticholinergic therapy.  Given Vesicare 5mg /10mg , Toviaz 4mg /8mg  samples, # 28.   Advised of the side effects, such as: Dry eyes, dry mouth, constipation, mental confusion and/or urinary retention. ***  - RTC in 3 weeks for PVR and symptom recheck ***      No follow-ups on file.  These notes generated with voice recognition software. I apologize for typographical errors.  Zara Council, PA-C  [facility] [address] [place], [place] [address] [phone]

## 2017-10-23 ENCOUNTER — Encounter: Payer: Self-pay | Admitting: Obstetrics & Gynecology

## 2017-10-23 ENCOUNTER — Ambulatory Visit: Payer: 59 | Admitting: Urology

## 2017-10-29 ENCOUNTER — Encounter: Payer: Self-pay | Admitting: Obstetrics & Gynecology

## 2017-10-29 ENCOUNTER — Ambulatory Visit (INDEPENDENT_AMBULATORY_CARE_PROVIDER_SITE_OTHER): Payer: 59 | Admitting: Obstetrics & Gynecology

## 2017-10-29 VITALS — BP 100/60 | Ht 66.0 in | Wt 167.0 lb

## 2017-10-29 DIAGNOSIS — N9419 Other specified dyspareunia: Secondary | ICD-10-CM

## 2017-10-29 DIAGNOSIS — G8929 Other chronic pain: Secondary | ICD-10-CM | POA: Diagnosis not present

## 2017-10-29 DIAGNOSIS — R1031 Right lower quadrant pain: Secondary | ICD-10-CM

## 2017-10-29 NOTE — Progress Notes (Signed)
Obstetrics & Gynecology Office Visit   Chief Complaint:  Chief Complaint  Patient presents with  . Abdominal Pain    History of Present Illness: 37 y.o. [id number] presenting for initial evaluation of dyspareunia.  Symptoms onset was several months ago and symptoms have been worsening.  The patient is not menopausal.  She is not currently on any HRT or hormonal medications.  She had TLH last summer for menorrhagia and fibroids.  Healed well and has had no bleeding or post coital bleeding since 12 weeks s/p surgery.  Pain is most pronounced with deep penetration and during coitus. This pain then lingers for 1-2 days after sex.  No other times. Pain is always RLQ/ groin area. She denies postcoital spotting.  Last pap smear normal.  She denies a history of sexually transmitted infections.  Associated symptoms include none.  She denies recent antibiotic exposure, denies changes in soaps, detergents coinciding with the onset of her symptoms.  She has not previously self treated or been under treatment by another provider for these symptoms.   Review of Systems  Constitutional: Positive for malaise/fatigue. Negative for chills and fever.  HENT: Negative for congestion, sinus pain and sore throat.   Eyes: Negative for blurred vision and pain.  Respiratory: Negative for cough and wheezing.   Cardiovascular: Negative for chest pain and leg swelling.  Gastrointestinal: Negative for abdominal pain, constipation, diarrhea, heartburn, nausea and vomiting.  Genitourinary: Negative for dysuria, frequency, hematuria and urgency.  Musculoskeletal: Negative for back pain, joint pain, myalgias and neck pain.  Skin: Negative for itching and rash.  Neurological: Negative for dizziness, tremors and weakness.  Endo/Heme/Allergies: Bruises/bleeds easily.  Psychiatric/Behavioral: Negative for depression. The patient is not nervous/anxious and does not have insomnia.    Past Medical History:  Past Medical  History:  Diagnosis Date  . Anemia    h/o  . Cervical high risk human papillomavirus (HPV) DNA test positive   . Chronic gastritis   . Depression   . DVT (deep venous thrombosis) ([place]) 09/2016   left leg  . GERD (gastroesophageal reflux disease)   . H. pylori infection   . History of attempted suicide   . History of gastric polyp   . History of kidney stones    h/o  . Migraines   . Protein C deficiency ([place])   . Rash   . Shift work sleep disorder    Past Surgical History:  Past Surgical History:  Procedure Laterality Date  . ABDOMINAL HYSTERECTOMY    . ANTERIOR CRUCIATE LIGAMENT REPAIR Left   . CHOLECYSTECTOMY    . CYSTOSCOPY  12/19/2016   Procedure: CYSTOSCOPY;  Surgeon: Gae Dry, MD;  Location: [facility] ORS;  Service: Gynecology;;  . ESOPHAGOGASTRODUODENOSCOPY (EGD) WITH PROPOFOL N/A 09/25/2017   Procedure: ESOPHAGOGASTRODUODENOSCOPY (EGD) WITH PROPOFOL;  Surgeon: Jonathon Bellows, MD;  Location: [facility] ENDOSCOPY;  Service: Gastroenterology;  Laterality: N/A;  . LAPAROSCOPIC HYSTERECTOMY Bilateral 12/19/2016   Procedure: HYSTERECTOMY TOTAL LAPAROSCOPIC BILATERAL SALPINGECTOMY;  Surgeon: Gae Dry, MD;  Location: [facility] ORS;  Service: Gynecology;  Laterality: Bilateral;  . TUBAL LIGATION     Gynecologic History: Patient's last menstrual period was 01/04/2016.  Obstetric History: [id number]  Family History:  Family History  Problem Relation Age of Onset  . HIV Sister   . Cancer Sister 29       cervical cancer  . Asthma Daughter   . Protein C deficiency Daughter   . Hypertension Maternal Grandmother   . Hypertension Mother   .  Obesity Mother   . Cancer Father 59       lung cancer  . Deep vein thrombosis Father   . Protein C deficiency Father   . COPD Father   . Diabetes Brother   . COPD Paternal Grandmother   . Deep vein thrombosis Paternal Grandmother   . Protein C deficiency Daughter    Social History:  Social History   Socioeconomic History  . Marital  status: Divorced    Spouse name: Not on file  . Number of children: 2  . Years of education: 13  . Highest education level: Associate degree: occupational, Hotel manager, or vocational program  Occupational History  . Not on file  Social Needs  . Financial resource strain: Somewhat hard  . Food insecurity:    Worry: Sometimes true    Inability: Sometimes true  . Transportation needs:    Medical: No    Non-medical: No  Tobacco Use  . Smoking status: Former Smoker    Packs/day: 0.25    Years: 5.00    Pack years: 1.25    Types: Cigarettes    Start date: 05/15/2002    Last attempt to quit: 05/16/2007    Years since quitting: 10.4  . Smokeless tobacco: Never Used  Substance and Sexual Activity  . Alcohol use: Yes    Alcohol/week: 0.6 oz    Types: 1 Glasses of wine per week    Comment: occ wine none last 24 hrs  . Drug use: No  . Sexual activity: Yes    Partners: Male    Birth control/protection: Surgical    Comment: hysterectomy   Lifestyle  . Physical activity:    Days per week: 3 days    Minutes per session: 60 min  . Stress: Not at all  Relationships  . Social connections:    Talks on phone: More than three times a week    Gets together: Once a week    Attends religious service: More than 4 times per year    Active member of club or organization: Yes    Attends meetings of clubs or organizations: 1 to 4 times per year    Relationship status: Divorced  . Intimate partner violence:    Fear of current or ex partner: No    Emotionally abused: No    Physically abused: No    Forced sexual activity: No  Other Topics Concern  . Not on file  Social History Narrative   Separated since June 2016 - she has two children at home . The youngest is his daughter. Working at [employer] in [place]. She works at [employer]. She had been laid off by back since 2017.    Dating Gwyndolyn Saxon since 2015   Allergies:  No Known Allergies  Medications: Prior to Admission medications     Medication Sig Start Date End Date Taking? Authorizing Provider  apixaban (ELIQUIS) 5 MG TABS tablet Take 1 tablet (5 mg total) by mouth 2 (two) times daily. 10/13/17  Yes Karen Kitchens, NP  chlorproMAZINE (THORAZINE) 25 MG tablet TAKE 1-2TABS AS NEEDED FOR HEADACHE RESCUE MAY REPEAT IN 2-3HOURS MAX 4 TABS/24 HOURS 05/10/17  Yes Orie Rout, MD  gabapentin (NEURONTIN) 100 MG capsule Take 300 mg by mouth daily. 05/10/17  Yes Orie Rout, MD  glycopyrrolate (ROBINUL) 1 MG tablet Take 3 mg by mouth daily. 01/29/17  Yes Dasher, Rayvon Char, MD  NOVOFINE PLUS 32G X 4 MM MISC See admin instructions. 06/07/17  Yes [provider]  SAXENDA 18 MG/3ML SOPN INJECT 2.8 MG INTO THE SKIN DAILY. 06/07/17  Yes [provider]  Vitamin D, Ergocalciferol, (DRISDOL) 50000 units CAPS capsule TAKE 1 CAPSULE (50,000 UNITS TOTAL) BY MOUTH EVERY 7 (SEVEN) DAYS. 09/19/17  Yes Sowles, Drue Stager, MD  gabapentin (NEURONTIN) 300 MG capsule Take by mouth daily. 09/11/17   [provider]  Lorcaserin HCl ER (BELVIQ XR) 20 MG TB24 Take 20 mg by mouth daily. Patient not taking: Reported on 10/29/2017 08/20/17   Fredderick Severance, NP  omeprazole (PRILOSEC) 40 MG capsule Take 1 capsule (40 mg total) by mouth daily. Patient not taking: Reported on 10/29/2017 09/27/17 12/26/17  Jonathon Bellows, MD  ondansetron (ZOFRAN ODT) 4 MG disintegrating tablet Take 1 tablet (4 mg total) by mouth every 8 (eight) hours as needed for nausea or vomiting. Patient not taking: Reported on 10/05/2017 07/12/17   Eula Listen, MD   Physical Exam Blood pressure 100/60, height 5\' 6"  (1.676 m), weight 167 lb (75.8 kg), last menstrual period 01/04/2016.  Patient's last menstrual period was 01/04/2016.  General: NAD HEENT: normocephalic, anicteric Thyroid: no enlargement, no palpable nodules Pulmonary: No increased work of breathing Cardiovascular: RRR, distal pulses 2+ Abdomen: NABS, soft, non-tender, non-distended.   Umbilicus without lesions.  No hepatomegaly, splenomegaly or masses palpable. No evidence of hernia  Genitourinary:  External: Normal external female genitalia.  Normal urethral meatus, normal  Bartholin's and Skene's glands.    Vagina: Normal vaginal mucosa, no evidence of prolapse.    Adnexa: ovaries non-enlarged, no adnexal masses.  T on right side and midline  Rectal: deferred  Lymphatic: no evidence of inguinal lymphadenopathy Extremities: no edema, erythema, or tenderness Neurologic: Grossly intact Psychiatric: mood appropriate, affect full  Female chaperone present for pelvic  portions of the physical exam  Assessment: 37 y.o. [id number] with RLQ Pain related to Dyspareunia  Plan: Problem List Items Addressed This Visit      Other   Chronic RLQ pain - Primary   Relevant Orders   US PELVIS TRANSVANGINAL NON-OB (TV ONLY)   Dyspareunia due to medical condition in female    [place] to assess pathology May be adhesion or musculoskeletal based. Consider PT  Barnett Applebaum, MD, [facility], [facility] 10/29/2017  11:49 AM

## 2017-11-09 DIAGNOSIS — G43019 Migraine without aura, intractable, without status migrainosus: Secondary | ICD-10-CM | POA: Diagnosis not present

## 2017-11-09 DIAGNOSIS — G43719 Chronic migraine without aura, intractable, without status migrainosus: Secondary | ICD-10-CM | POA: Diagnosis not present

## 2017-11-13 ENCOUNTER — Other Ambulatory Visit: Payer: Self-pay | Admitting: Urgent Care

## 2017-11-13 ENCOUNTER — Other Ambulatory Visit: Payer: Self-pay | Admitting: Family Medicine

## 2017-11-13 DIAGNOSIS — Z8639 Personal history of other endocrine, nutritional and metabolic disease: Secondary | ICD-10-CM

## 2017-11-13 DIAGNOSIS — E669 Obesity, unspecified: Secondary | ICD-10-CM

## 2017-11-13 NOTE — Telephone Encounter (Signed)
Called and spoke with patient. Tammy Garza was discontinued because they thought that it was causing her stomach problems so they switched her to [place]. She found out later that her abdominal issues were not related to the [place] and her insurance will not cover the Belviq so she would like to continue the Saxenda. She would like to try to avoid having to make another appointment because it was just minor confusion about her abdominal pain.

## 2017-11-13 NOTE — Telephone Encounter (Signed)
At last visit on 08/20/17, pt saw [facility] NP-C, and was taken off of Saxenda, placed on Belviq, which it appears she reported on 10/29/2017 to also no longer be taking.  Refill denied for saxenda, she will need an appointment to discuss weight loss medications if she would like to restart anything.

## 2017-11-13 NOTE — Telephone Encounter (Signed)
Refill request for general medication: Saxenda   Last office visit: 08/20/2017  Last physical exam: 08/20/2017  No follow-ups on file.

## 2017-11-14 ENCOUNTER — Ambulatory Visit (INDEPENDENT_AMBULATORY_CARE_PROVIDER_SITE_OTHER): Payer: 59

## 2017-11-14 ENCOUNTER — Encounter: Payer: Self-pay | Admitting: Obstetrics & Gynecology

## 2017-11-14 ENCOUNTER — Ambulatory Visit (INDEPENDENT_AMBULATORY_CARE_PROVIDER_SITE_OTHER): Payer: 59 | Admitting: Obstetrics & Gynecology

## 2017-11-14 VITALS — BP 100/60 | Ht 66.0 in | Wt 168.0 lb

## 2017-11-14 DIAGNOSIS — N941 Unspecified dyspareunia: Secondary | ICD-10-CM | POA: Diagnosis not present

## 2017-11-14 DIAGNOSIS — N83201 Unspecified ovarian cyst, right side: Secondary | ICD-10-CM

## 2017-11-14 DIAGNOSIS — R1031 Right lower quadrant pain: Secondary | ICD-10-CM

## 2017-11-14 DIAGNOSIS — N9419 Other specified dyspareunia: Secondary | ICD-10-CM | POA: Diagnosis not present

## 2017-11-14 DIAGNOSIS — G8929 Other chronic pain: Secondary | ICD-10-CM | POA: Diagnosis not present

## 2017-11-14 NOTE — Progress Notes (Signed)
  HPI: Pt has continued RLQ pain esp after sex.  Prior hysterectomy.  Several months of pain now, severe at times, no radiation, no modifiers, no assoc sx's.  No bleeding.  Pain is deep and  Last for 1-2 days after intercourse. No discharge.  Ultrasound demonstrates no masses seen, Small right cysts on ovary.  PMHx: She  has a past medical history of Anemia, Cervical high risk human papillomavirus (HPV) DNA test positive, Chronic gastritis, Depression, DVT (deep venous thrombosis) ([facility]) (09/2016), GERD (gastroesophageal reflux disease), H. pylori infection, History of attempted suicide, History of gastric polyp, History of kidney stones, Migraines, Protein C deficiency ([place]), Rash, and Shift work sleep disorder. Also,  has a past surgical history that includes Tubal ligation; Cholecystectomy; Laparoscopic hysterectomy (Bilateral, 12/19/2016); Cystoscopy (12/19/2016); Abdominal hysterectomy; Anterior cruciate ligament repair (Left); and Esophagogastroduodenoscopy (egd) with propofol (N/A, 09/25/2017)., family history includes Asthma in her daughter; COPD in her father and paternal grandmother; Cancer (age of onset: 18) in her sister; Cancer (age of onset: 71) in her father; Deep vein thrombosis in her father and paternal grandmother; Diabetes in her brother; HIV in her sister; Hypertension in her maternal grandmother and mother; Obesity in her mother; Protein C deficiency in her daughter, daughter, and father.,  reports that she quit smoking about 10 years ago. Her smoking use included cigarettes. She started smoking about 15 years ago. She has a 1.25 pack-year smoking history. She has never used smokeless tobacco. She reports that she drinks about 0.6 oz of alcohol per week. She reports that she does not use drugs.  She has a current medication list which includes the following prescription(s): chlorpromazine, eliquis, gabapentin, gabapentin, glycopyrrolate, lorcaserin hcl er, novofine plus, omeprazole,  ondansetron, saxenda, and vitamin d (ergocalciferol). Also, has No Known Allergies.  Review of Systems  All other systems reviewed and are negative.   Objective: BP 100/60   Ht 5\' 6"  (1.676 m)   Wt 168 lb (76.2 kg)   LMP 01/04/2016   BMI 27.12 kg/m   Physical examination Constitutional NAD, Conversant  Skin No rashes, lesions or ulceration.   Extremities: Moves all appropriately.  Normal ROM for age. No lymphadenopathy.  Neuro: Grossly intact  Psych: Oriented to PPT.  Normal mood. Normal affect.   Assessment:  Dyspareunia due to medical condition in female  Chronic RLQ pain  Likely musculoskeletal or adhesion based. May be cystic based but these are small PT as next option Dx Lap/ Cystectomy/ Adhesiolysis if that does not lead to improvements Pt concurs, will arrange pelvic floor rehab PT  Barnett Applebaum, MD, [place], [facility] 11/14/2017  4:44 PM

## 2017-11-23 ENCOUNTER — Ambulatory Visit
Admission: RE | Admit: 2017-11-23 | Discharge: 2017-11-23 | Disposition: A | Discharge status: Home / Self Care | Payer: 59 | Source: Ambulatory Visit | Attending: Nurse Practitioner | Admitting: Nurse Practitioner

## 2017-11-23 ENCOUNTER — Encounter: Payer: Self-pay | Admitting: Nurse Practitioner

## 2017-11-23 ENCOUNTER — Inpatient Hospital Stay: Payer: 59 | Attending: Nurse Practitioner | Admitting: Nurse Practitioner

## 2017-11-23 VITALS — BP 104/67 | HR 75 | Temp 96.9°F | Resp 16 | Wt 172.0 lb

## 2017-11-23 DIAGNOSIS — D472 Monoclonal gammopathy: Secondary | ICD-10-CM | POA: Diagnosis not present

## 2017-11-23 DIAGNOSIS — Z79899 Other long term (current) drug therapy: Secondary | ICD-10-CM | POA: Diagnosis not present

## 2017-11-23 DIAGNOSIS — Z86718 Personal history of other venous thrombosis and embolism: Secondary | ICD-10-CM | POA: Insufficient documentation

## 2017-11-23 DIAGNOSIS — M7989 Other specified soft tissue disorders: Secondary | ICD-10-CM | POA: Diagnosis not present

## 2017-11-23 DIAGNOSIS — D6859 Other primary thrombophilia: Secondary | ICD-10-CM | POA: Insufficient documentation

## 2017-11-23 DIAGNOSIS — Z87891 Personal history of nicotine dependence: Secondary | ICD-10-CM | POA: Diagnosis not present

## 2017-11-23 DIAGNOSIS — I825Z2 Chronic embolism and thrombosis of unspecified deep veins of left distal lower extremity: Secondary | ICD-10-CM

## 2017-11-23 DIAGNOSIS — Z7901 Long term (current) use of anticoagulants: Secondary | ICD-10-CM | POA: Insufficient documentation

## 2017-11-23 DIAGNOSIS — M79662 Pain in left lower leg: Secondary | ICD-10-CM | POA: Diagnosis not present

## 2017-11-23 NOTE — Patient Instructions (Signed)
Go for Ultrasound now to Left Lower Leg. The stores we discussed for compression garments is: [facility] in [place], [place]

## 2017-11-23 NOTE — Progress Notes (Signed)
Symptom Management [place]  [phone] [phone]  Patient Care Team: Tammy Sizer, MD as PCP - General (Family Medicine) Tammy Asal, MD as Referring Physician (Hematology and Oncology)   Name of the patient: Tammy Garza  [id number]  1981/03/29   Date of visit: 11/23/17  Diagnosis- History of DVT and Protein C Deficiency  Chief complaint/ Reason for visit- Leg Pain  Heme history:  Patient is followed by Dr. Mike Garza for prior history of protein C deficiency and a left lower extremity DVT following a 2 1/2 hour flight.  She had been on DepoProvera x 1 year.  She has a family history of thrombosis.  Left lower extremity duplex on 09/17/2016 revealed an acute appearing thrombus in the calf veins of the left lower extremity (posterior tibial and peroneal veins).  Bilateral lower extremity duplex on 11/20/2016 revealed no evidence of acute DVT within either lower extremity. There was some residual nonocclusive thrombus in the left peroneal vein, sequela of previous DVT on the prior scan.  Left lower extremity duplex on 07/05/2017 revealed an isolated thrombus of the left peroneal vein in the calf similar to 11/20/2016.  There was no acute thrombus.  She is on Eliquis.    Hypercoagulable work-up on 09/26/2016 and 11/30/2016 revealed the following negative studies:  Factor V Leiden, prothrombin gene mutation, lupus anticoagulant panel, anticardiolipin antibodies, beta2-glycoprotein, protein S antigen/activity, and ATIII antigen/activity.  She has protein C deficiency.  Labs on 11/30/2016 revealed a protein C total of 40% (60 - 150%) and protein C activity of 49% (73-180%).  Repeat testing on 03/29/2017 revealed a protein C total of 51% and a protein C activity of 51%.  She has a monoclonal gammopathy of unknown significance.  SPEP on 09/26/2016 was negative.  Immunofixation revealed an IgA monoclonal protein with lambda  light chain specificity.  Free light chain assay was normal.  IgG was 698 ([phone]).  IgA was 182 (87-352).  24 hour UPEP on 10/22/2016 revealed no monoclonal protein.  SPEP on 03/29/2017 was negative.  Chest, abdomen, and pelvic CT on 12/08/2016 revealed no lymphadenopathy or hepatosplenomegaly.  She underwent total laparoscopic hysterectomy with bilateral salpingectomy on 12/19/2016. She was off Eliquis x 3 days (2 days pre-op and day of surgery).  She developed a small superficial vein thrombus.  She underwent surgery (ACL reconstruction with autograft)  on 05/25/2017.  Left lower extremity duplex on 07/05/2017 revealed an isolated thrombus of the left peroneal vein in the calf.  As compared to 11/20/2016, the thrombus within the left peroneal vein appeared similar.  There was no acute thrombus.  Interval history-  Tammy Garza reports symptoms of swelling and pain of theleft leg(s). Symptoms have been present for 12 hours  She has had similar problems in the past and has had prior provoked DVT, knee surgery in same leg, is on anti-coagulation with Eliquis, has not missed doses. She has not been wearing compression hose this week and has been working increased amount where she stands for long periods of time. She is able to ambulate but reports altered gait and pain.  She denies any neurologic complaints. She has no recent fevers or illnesses. She denies any easy bleeding or bruising. She has a good appetite and denies weight loss. She has no chest pain. She denies any nausea, vomiting, constipation, or diarrhea. She has no urinary complaints. Patient offers no further specific complaints today.  ECOG FS:1 - Symptomatic but completely ambulatory  Review of  systems- Review of Systems  Constitutional: Negative for chills, fever, malaise/fatigue and weight loss.  HENT: Negative for congestion, ear discharge, ear pain, sinus pain, sore throat and tinnitus.   Eyes: Negative.   Respiratory:  Negative.  Negative for cough, sputum production and shortness of breath.   Cardiovascular: Negative for chest pain, palpitations, orthopnea, claudication and leg swelling.  Gastrointestinal: Negative for abdominal pain, blood in stool, constipation, diarrhea, heartburn, nausea and vomiting.  Genitourinary: Negative.   Musculoskeletal: Positive for joint pain.  Skin: Negative.   Neurological: Positive for weakness. Negative for dizziness, tingling and headaches.  Endo/Heme/Allergies: Negative.   Psychiatric/Behavioral: The patient is nervous/anxious.     No Known Allergies  Past Medical History:  Diagnosis Date  . Anemia    h/o  . Cervical high risk human papillomavirus (HPV) DNA test positive   . Chronic gastritis   . Depression   . DVT (deep venous thrombosis) ([place]) 09/2016   left leg  . GERD (gastroesophageal reflux disease)   . H. pylori infection   . History of attempted suicide   . History of gastric polyp   . History of kidney stones    h/o  . Migraines   . Protein C deficiency ([place])   . Rash   . Shift work sleep disorder      Past Surgical History:  Procedure Laterality Date  . ABDOMINAL HYSTERECTOMY    . ANTERIOR CRUCIATE LIGAMENT REPAIR Left   . CHOLECYSTECTOMY    . CYSTOSCOPY  12/19/2016   Procedure: CYSTOSCOPY;  Surgeon: Gae Dry, MD;  Location: [facility] ORS;  Service: Gynecology;;  . ESOPHAGOGASTRODUODENOSCOPY (EGD) WITH PROPOFOL N/A 09/25/2017   Procedure: ESOPHAGOGASTRODUODENOSCOPY (EGD) WITH PROPOFOL;  Surgeon: Jonathon Bellows, MD;  Location[facility] END[facility]COPY;  Service: Gastroenterology;  Laterality: N/A;  . LAPAROSCOPIC HYSTERECTOMY Bilateral 12/19/2016   Procedure: HYSTERECTOMY TOTAL LAPAROSCOPIC BILATERAL SALPINGECTOMY;  Surgeon: Gae Dry, MD;  Location: [facility] ORS;  Service: Gynecology;  Laterality: Bilateral;  . TUBAL LIGATION      Social History   Socioeconomic History  . Marital status: Divorced    Spouse name: Not on file  . Number of  children: 2  . Years of education: 64  . Highest education level: Associate degree: occupational, Hotel manager, or vocational program  Occupational History  . Not on file  Social Needs  . Financial resource strain: Somewhat hard  . Food insecurity:    Worry: Sometimes true    Inability: Sometimes true  . Transportation needs:    Medical: No    Non-medical: No  Tobacco Use  . Smoking status: Former Smoker    Packs/day: 0.25    Years: 5.00    Pack years: 1.25    Types: Cigarettes    Start date: 05/15/2002    Last attempt to quit: 05/16/2007    Years since quitting: 10.5  . Smokeless tobacco: Never Used  Substance and Sexual Activity  . Alcohol use: Yes    Alcohol/week: 0.6 oz    Types: 1 Glasses of wine per week    Comment: occ wine none last 24 hrs  . Drug use: No  . Sexual activity: Yes    Partners: Male    Birth control/protection: Surgical    Comment: hysterectomy   Lifestyle  . Physical activity:    Days per week: 3 days    Minutes per session: 60 min  . Stress: Not at all  Relationships  . Social connections:    Talks on phone: More than three  times a week    Gets together: Once a week    Attends religious service: More than 4 times per year    Active member of club or organization: Yes    Attends meetings of clubs or organizations: 1 to 4 times per year    Relationship status: Divorced  . Intimate partner violence:    Fear of current or ex partner: No    Emotionally abused: No    Physically abused: No    Forced sexual activity: No  Other Topics Concern  . Not on file  Social History Narrative   Separated since June 2016 - she has two children at home . The youngest is his daughter. Working at [employer] in [place]. She works at [employer]. She had been laid off by back since 2017.    Dating Gwyndolyn Saxon since 2015    Family History  Problem Relation Age of Onset  . HIV Sister   . Cancer Sister 19       cervical cancer  . Asthma Daughter   . Protein C  deficiency Daughter   . Hypertension Maternal Grandmother   . Hypertension Mother   . Obesity Mother   . Cancer Father 53       lung cancer  . Deep vein thrombosis Father   . Protein C deficiency Father   . COPD Father   . Diabetes Brother   . COPD Paternal Grandmother   . Deep vein thrombosis Paternal Grandmother   . Protein C deficiency Daughter     Current Outpatient Medications:  .  chlorproMAZINE (THORAZINE) 25 MG tablet, TAKE 1-2TABS AS NEEDED FOR HEADACHE RESCUE MAY REPEAT IN 2-3HOURS MAX 4 TABS/24 HOURS, Disp: , Rfl: 1 .  ELIQUIS 5 MG TABS tablet, TAKE 1 TABLET BY MOUTH TWICE A DAY, Disp: 60 tablet, Rfl: 0 .  gabapentin (NEURONTIN) 300 MG capsule, Take 600 mg by mouth daily. , Disp: , Rfl: 2 .  glycopyrrolate (ROBINUL) 1 MG tablet, Take 3 mg by mouth daily., Disp: , Rfl: 2 .  Vitamin D, Ergocalciferol, (DRISDOL) 50000 units CAPS capsule, TAKE 1 CAPSULE (50,000 UNITS TOTAL) BY MOUTH EVERY 7 (SEVEN) DAYS., Disp: 12 capsule, Rfl: 0  Physical exam:  Vitals:   11/23/17 1005  BP: 104/67  Pulse: 75  Resp: 16  Temp: (!) 96.9 F (36.1 C)  TempSrc: Tympanic  Weight: 172 lb (78 kg)   Physical Exam  Constitutional: She is oriented to person, place, and time. She appears well-developed and well-nourished.  Unaccompanied  HENT:  Head: Atraumatic.  Nose: Nose normal.  Mouth/Throat: Oropharynx is clear and moist. No oropharyngeal exudate.  Eyes: Conjunctivae are normal. No scleral icterus.  Neck: Normal range of motion.  Cardiovascular: Normal rate, regular rhythm and normal heart sounds.  Pulmonary/Chest: Effort normal and breath sounds normal.  Abdominal: Soft. Bowel sounds are normal.  Musculoskeletal: She exhibits edema.  Minimal dorsal pedal edema  Neurological: She is alert and oriented to person, place, and time.  Skin: Skin is warm and dry.  Psychiatric: She has a normal mood and affect.     CMP Latest Ref Rng & Units 10/05/2017  Glucose 65 - 99 mg/dL 87  BUN 6 -  20 mg/dL 11  Creatinine 0.44 - 1.00 mg/dL 0.88  Sodium 135 - 145 mmol/L 137  Potassium 3.5 - 5.1 mmol/L 3.8  Chloride 101 - 111 mmol/L 104  CO2 22 - 32 mmol/L 24  Calcium 8.9 - 10.3 mg/dL 9.0  Total Protein  6.5 - 8.1 g/dL 7.9  Total Bilirubin 0.3 - 1.2 mg/dL 1.1  Alkaline Phos 38 - 126 U/L 43  AST 15 - 41 U/L 19  ALT 14 - 54 U/L 14   CBC Latest Ref Rng & Units 10/05/2017  WBC 3.6 - 11.0 K/uL 6.4  Hemoglobin 12.0 - 16.0 g/dL 12.8  Hematocrit 35.0 - 47.0 % 38.0  Platelets 150 - 440 K/uL 220    No images are attached to the encounter.  US Pelvis Transvanginal Non-ob (tv Only)  Result Date: 11/23/2017 ULTRASOUND REPORT Location: [facility] Date of Service: 11/14/2017 Patient Name: Tarynn Garling DOB: [birth year] MRN: [id number] [place]; Rt-sided pelvic pain x 2 months. Findings: The uterus is surgically absent since August 2018. Right Ovary measures 2.95 x 2.33 x 2.22cm. It is not normal in appearance. There are numerous cystic structures including apparent daughter cyst. Left Ovary measures 2.84 x 1.28 x 1.71cm. It is normal in appearance. Survey of the adnexa demonstrates no adnexal masses. There is no free fluid in the cul de sac. Impression: 1. Numerous cystic structures within right ovary. Recommendations: 1.Clinical correlation with the patient's History and Physical Exam. Meyer Cory RDMS, RVT Review of ULTRASOUND.    I have personally reviewed images and report of recent ultrasound done at [facility].    Plan of management to be discussed with patient. Barnett Applebaum, MD, [facility], [facility] 11/23/2017  7:47 AM   US Venous Img Lower Unilateral Left  Result Date: 11/23/2017 CLINICAL DATA:  Left lower extremity swelling and pain, history of DVT EXAM: LEFT LOWER EXTREMITY VENOUS DOPPLER ULTRASOUND TECHNIQUE: Gray-scale sonography with graded compression, as well as color Doppler and duplex ultrasound were performed to evaluate the lower extremity  deep venous systems from the level of the common femoral vein and including the common femoral, femoral, profunda femoral, popliteal and calf veins including the posterior tibial, peroneal and gastrocnemius veins when visible. The superficial great saphenous vein was also interrogated. Spectral Doppler was utilized to evaluate flow at rest and with distal augmentation maneuvers in the common femoral, femoral and popliteal veins. COMPARISON:  07/05/2017 FINDINGS: Contralateral Common Femoral Vein: Respiratory phasicity is normal and symmetric with the symptomatic side. No evidence of thrombus. Normal compressibility. Common Femoral Vein: No evidence of thrombus. Normal compressibility, respiratory phasicity and response to augmentation. Saphenofemoral Junction: No evidence of thrombus. Normal compressibility and flow on color Doppler imaging. Profunda Femoral Vein: No evidence of thrombus. Normal compressibility and flow on color Doppler imaging. Femoral Vein: No evidence of thrombus. Normal compressibility, respiratory phasicity and response to augmentation. Popliteal Vein: No evidence of thrombus. Normal compressibility, respiratory phasicity and response to augmentation. Calf Veins: No evidence of thrombus. Normal compressibility and flow on color Doppler imaging. Superficial Great Saphenous Vein: No evidence of thrombus. Normal compressibility. Venous Reflux:  None. Other Findings:  None. IMPRESSION: No evidence of deep venous thrombosis. Electronically Signed   By: Jerilynn Mages.  Shick M.D.   On: 11/23/2017 11:38     Assessment and plan- Patient is a 37 y.o. female with prior history of protein C deficiency and prior provoked DVT who presents to symptom management clinic for leg swelling and pain. Patient 1.  Protein C deficiency and history of prior DVT-hypercoagulable work-up after previous DVT revealed protein C deficiency of 40% and protein C activity of 49%.  Repeat testing on 03/29/2017 revealed protein C total  of 51% and a protein C activity of 51%.  She developed a small superficial vein thrombus  after TLH-BS on 12/19/16 when she was off Eliquis x 3 days. She underwent ACL reconstruction on 05/25/2017. LLE duplex on 07/05/17 revealed isolated thrombus of left peroneal vein in the calf similar to imaging from 11/20/16.   2. MGUS- SPEP on 09/26/2016 was negative.  Immunofixation revealed IgA monoclonal protein with lambda light chain specificity.  CT C/A/P on 12/08/16 revealed no lymphadenopathy or hepatosplenomegaly.   3. Leg Swelling- Ultrasound today negative for acute thrombus. Symptoms likely due to recent exertion and standing as imaging negative for reflux. Recommend continued ambulation, Eliquis, use of graduated compression hose, and elevation of extremities when possible/during sleep.   Follow up with Dr. Mike Garza as scheduled.    Visit Diagnosis 1. Leg swelling   2. History of DVT of lower extremity   3. Protein C deficiency ([place])     Patient expressed understanding and was in agreement with this plan. She also understands that She can call clinic at any time with any questions, concerns, or complaints.    Beckey Rutter, DNP, AGNP-[facility]

## 2017-11-23 NOTE — Progress Notes (Signed)
Patient here for new onset of swelling and numbness and tingling in her left leg. She was diagnosed with a DVT in the same leg in May 2018. She is on Eliquis and states that she has not missed any doses. She was at work yesterday when she noticed the swelling and the numbness and tingling started after that. She also noticed some discoloration and some "heat in the leg". She denies having any pain, and states that she has not done anything to injure the leg or cause any swelling.

## 2017-11-29 ENCOUNTER — Encounter: Payer: Self-pay | Admitting: Obstetrics & Gynecology

## 2017-12-20 DIAGNOSIS — M222X2 Patellofemoral disorders, left knee: Secondary | ICD-10-CM | POA: Diagnosis not present

## 2017-12-20 DIAGNOSIS — M25561 Pain in right knee: Secondary | ICD-10-CM | POA: Diagnosis not present

## 2018-01-02 DIAGNOSIS — M222X1 Patellofemoral disorders, right knee: Secondary | ICD-10-CM | POA: Diagnosis not present

## 2018-01-02 DIAGNOSIS — R600 Localized edema: Secondary | ICD-10-CM | POA: Diagnosis not present

## 2018-01-02 DIAGNOSIS — M25561 Pain in right knee: Secondary | ICD-10-CM | POA: Diagnosis not present

## 2018-01-04 ENCOUNTER — Inpatient Hospital Stay: Payer: 59 | Attending: Hematology and Oncology

## 2018-01-04 DIAGNOSIS — Z79899 Other long term (current) drug therapy: Secondary | ICD-10-CM | POA: Diagnosis not present

## 2018-01-04 DIAGNOSIS — Z86718 Personal history of other venous thrombosis and embolism: Secondary | ICD-10-CM | POA: Diagnosis not present

## 2018-01-04 DIAGNOSIS — D472 Monoclonal gammopathy: Secondary | ICD-10-CM | POA: Diagnosis not present

## 2018-01-04 DIAGNOSIS — I825Z2 Chronic embolism and thrombosis of unspecified deep veins of left distal lower extremity: Secondary | ICD-10-CM

## 2018-01-04 DIAGNOSIS — D6859 Other primary thrombophilia: Secondary | ICD-10-CM | POA: Diagnosis not present

## 2018-01-04 DIAGNOSIS — Z87891 Personal history of nicotine dependence: Secondary | ICD-10-CM | POA: Insufficient documentation

## 2018-01-04 DIAGNOSIS — Z7901 Long term (current) use of anticoagulants: Secondary | ICD-10-CM | POA: Diagnosis not present

## 2018-01-04 LAB — CBC WITH DIFFERENTIAL/PLATELET
Basophils Absolute: 0.1 10*3/uL (ref 0–0.1)
Basophils Relative: 1 %
Eosinophils Absolute: 0.2 10*3/uL (ref 0–0.7)
Eosinophils Relative: 3 %
HCT: 34.5 % — ABNORMAL LOW (ref 35.0–47.0)
Hemoglobin: 11.6 g/dL — ABNORMAL LOW (ref 12.0–16.0)
Lymphocytes Relative: 34 %
Lymphs Abs: 2.8 10*3/uL (ref 1.0–3.6)
MCH: 31.6 pg (ref 26.0–34.0)
MCHC: 33.6 g/dL (ref 32.0–36.0)
MCV: 93.9 fL (ref 80.0–100.0)
Monocytes Absolute: 0.8 10*3/uL (ref 0.2–0.9)
Monocytes Relative: 9 %
Neutro Abs: 4.5 10*3/uL (ref 1.4–6.5)
Neutrophils Relative %: 53 %
Platelets: 232 10*3/uL (ref 150–440)
RBC: 3.67 MIL/uL — ABNORMAL LOW (ref 3.80–5.20)
RDW: 13.3 % (ref 11.5–14.5)
WBC: 8.4 10*3/uL (ref 3.6–11.0)

## 2018-01-04 LAB — COMPREHENSIVE METABOLIC PANEL
ALT: 12 U/L (ref 0–44)
AST: 18 U/L (ref 15–41)
Albumin: 4 g/dL (ref 3.5–5.0)
Alkaline Phosphatase: 50 U/L (ref 38–126)
Anion gap: 7 (ref 5–15)
BUN: 14 mg/dL (ref 6–20)
CO2: 24 mmol/L (ref 22–32)
Calcium: 8.7 mg/dL — ABNORMAL LOW (ref 8.9–10.3)
Chloride: 108 mmol/L (ref 98–111)
Creatinine, Ser: 0.74 mg/dL (ref 0.44–1.00)
GFR calc Af Amer: >60 mL/min (ref >60)
GFR calc non Af Amer: >60 mL/min (ref >60)
Glucose, Bld: 108 mg/dL — ABNORMAL HIGH (ref 70–99)
Potassium: 3.7 mmol/L (ref 3.5–5.1)
Sodium: 139 mmol/L (ref 135–145)
Total Bilirubin: 0.7 mg/dL (ref 0.3–1.2)
Total Protein: 7.1 g/dL (ref 6.5–8.1)

## 2018-01-07 ENCOUNTER — Other Ambulatory Visit: Payer: Self-pay | Admitting: Hematology and Oncology

## 2018-01-15 ENCOUNTER — Other Ambulatory Visit: Payer: Self-pay | Admitting: Hematology and Oncology

## 2018-01-15 ENCOUNTER — Encounter: Payer: Self-pay | Admitting: Hematology and Oncology

## 2018-01-15 MED ORDER — APIXABAN 5 MG PO TABS: 5.0000 mg | ORAL_TABLET | Freq: Two times a day (BID) | ORAL | 0 refills | Status: DC

## 2018-01-16 ENCOUNTER — Other Ambulatory Visit: Payer: Self-pay | Admitting: Hematology and Oncology

## 2018-01-16 ENCOUNTER — Ambulatory Visit (INDEPENDENT_AMBULATORY_CARE_PROVIDER_SITE_OTHER): Payer: 59 | Admitting: Obstetrics & Gynecology

## 2018-01-16 ENCOUNTER — Encounter: Payer: Self-pay | Admitting: Obstetrics & Gynecology

## 2018-01-16 VITALS — BP 100/60 | Ht 66.0 in | Wt 185.0 lb

## 2018-01-16 DIAGNOSIS — K644 Residual hemorrhoidal skin tags: Secondary | ICD-10-CM | POA: Diagnosis not present

## 2018-01-16 DIAGNOSIS — N9419 Other specified dyspareunia: Secondary | ICD-10-CM

## 2018-01-16 DIAGNOSIS — Z0000 Encounter for general adult medical examination without abnormal findings: Secondary | ICD-10-CM

## 2018-01-16 DIAGNOSIS — Z01411 Encounter for gynecological examination (general) (routine) with abnormal findings: Secondary | ICD-10-CM | POA: Diagnosis not present

## 2018-01-16 NOTE — Progress Notes (Signed)
HPI:      [name] is a 37 y.o. [id number] who LMP was Patient's last menstrual period was 01/04/2016., she presents today for her annual examination. The patient has no complaints today, other than anal skin tag that bleeds and is bothersome at times; is not a hemorrhoid.  No menopausal sx's (prior hyst).  Breast T at times.  IMPROVED DYSPAREUNIA, with Kegels but did not go see PT.  The patient is sexually active. Her last pap: approximate date 2018 and was normal. The patient does perform self breast exams.  There is no notable family history of breast or ovarian cancer in her family.  The patient has regular exercise: yes.  The patient denies current symptoms of depression.    GYN History: Contraception: status post hysterectomy  PMHx: Past Medical History:  Diagnosis Date  . Anemia    h/o  . Cervical high risk human papillomavirus (HPV) DNA test positive   . Chronic gastritis   . Depression   . DVT (deep venous thrombosis) ([place]) 09/2016   left leg  . GERD (gastroesophageal reflux disease)   . H. pylori infection   . History of attempted suicide   . History of gastric polyp   . History of kidney stones    h/o  . Migraines   . Protein C deficiency ([place])   . Rash   . Shift work sleep disorder    Past Surgical History:  Procedure Laterality Date  . ABDOMINAL HYSTERECTOMY    . ANTERIOR CRUCIATE LIGAMENT REPAIR Left   . CHOLECYSTECTOMY    . CYSTOSCOPY  12/19/2016   Procedure: CYSTOSCOPY;  Surgeon: Tammy Dry, Tammy Garza;  Location: [facility] ORS;  Service: Gynecology;;  . ESOPHAGOGASTRODUODENOSCOPY (EGD) WITH PROPOFOL N/A 09/25/2017   Procedure: ESOPHAGOGASTRODUODENOSCOPY (EGD) WITH PROPOFOL;  Surgeon: Tammy Bellows, Tammy Garza;  Location: [facility] ENDOSCOPY;  Service: Gastroenterology;  Laterality: N/A;  . LAPAROSCOPIC HYSTERECTOMY Bilateral 12/19/2016   Procedure: HYSTERECTOMY TOTAL LAPAROSCOPIC BILATERAL SALPINGECTOMY;  Surgeon: Tammy Dry, Tammy Garza;  Location: [facility] ORS;  Service: Gynecology;   Laterality: Bilateral;  . TUBAL LIGATION     Family History  Problem Relation Age of Onset  . HIV Sister   . Cancer Sister 87       cervical cancer  . Asthma Daughter   . Protein C deficiency Daughter   . Hypertension Maternal Grandmother   . Hypertension Mother   . Obesity Mother   . Cancer Father 40       lung cancer  . Deep vein thrombosis Father   . Protein C deficiency Father   . COPD Father   . Diabetes Brother   . COPD Paternal Grandmother   . Deep vein thrombosis Paternal Grandmother   . Protein C deficiency Daughter    Social History   Tobacco Use  . Smoking status: Former Smoker    Packs/day: 0.25    Years: 5.00    Pack years: 1.25    Types: Cigarettes    Start date: 05/15/2002    Last attempt to quit: 05/16/2007    Years since quitting: 10.6  . Smokeless tobacco: Never Used  Substance Use Topics  . Alcohol use: Yes    Alcohol/week: 1.0 standard drinks    Types: 1 Glasses of wine per week    Comment: occ wine none last 24 hrs  . Drug use: No    Current Outpatient Medications:  .  apixaban (ELIQUIS) 5 MG TABS tablet, Take 1 tablet (5 mg total) by mouth  2 (two) times daily., Disp: 60 tablet, Rfl: 0 .  gabapentin (NEURONTIN) 600 MG tablet, Take 600 mg by mouth daily., Disp: , Rfl: 5 .  glycopyrrolate (ROBINUL) 1 MG tablet, Take 3 mg by mouth daily., Disp: , Rfl: 2 .  chlorproMAZINE (THORAZINE) 25 MG tablet, TAKE 1-2TABS AS NEEDED FOR HEADACHE RESCUE MAY REPEAT IN 2-3HOURS MAX 4 TABS/24 HOURS, Disp: , Rfl: 1 .  gabapentin (NEURONTIN) 300 MG capsule, Take 600 mg by mouth daily. , Disp: , Rfl: 2 .  Vitamin D, Ergocalciferol, (DRISDOL) 50000 units CAPS capsule, TAKE 1 CAPSULE (50,000 UNITS TOTAL) BY MOUTH EVERY 7 (SEVEN) DAYS., Disp: 12 capsule, Rfl: 0 Allergies: Patient has no known allergies.  Review of Systems  Constitutional: Negative for chills, fever and malaise/fatigue.  HENT: Negative for congestion, sinus pain and sore throat.   Eyes: Negative for  blurred vision and pain.  Respiratory: Negative for cough and wheezing.   Cardiovascular: Negative for chest pain and leg swelling.  Gastrointestinal: Positive for diarrhea. Negative for abdominal pain, constipation, heartburn, nausea and vomiting.  Genitourinary: Negative for dysuria, frequency, hematuria and urgency.  Musculoskeletal: Negative for back pain, joint pain, myalgias and neck pain.  Skin: Positive for itching. Negative for rash.  Neurological: Positive for headaches. Negative for dizziness, tremors and weakness.  Endo/Heme/Allergies: Does not bruise/bleed easily.  Psychiatric/Behavioral: Negative for depression. The patient is not nervous/anxious and does not have insomnia.     Objective: BP 100/60   Ht 5\' 6"  (1.676 m)   Wt 185 lb (83.9 kg)   LMP 01/04/2016   BMI 29.86 kg/m   Filed Weights   01/16/18 0929  Weight: 185 lb (83.9 kg)   Body mass index is 29.86 kg/m. Physical Exam  Constitutional: She is oriented to person, place, and time. She appears well-developed and well-nourished. No distress.  Genitourinary: Rectum normal and vagina normal. Pelvic exam was performed with patient supine. There is no rash or lesion on the right labia. There is no rash or lesion on the left labia. Vagina exhibits no lesion. No bleeding in the vagina. Right adnexum does not display mass and does not display tenderness. Left adnexum does not display mass and does not display tenderness.  Genitourinary Comments: Absent Uterus Absent cervix Vaginal cuff well healed Anal skin tag noted overlying spincter  HENT:  Head: Normocephalic and atraumatic. Head is without laceration.  Right Ear: Hearing normal.  Left Ear: Hearing normal.  Nose: No epistaxis.  No foreign bodies.  Mouth/Throat: Uvula is midline, oropharynx is clear and moist and mucous membranes are normal.  Eyes: Pupils are equal, round, and reactive to light.  Neck: Normal range of motion. Neck supple. No thyromegaly present.    Cardiovascular: Normal rate and regular rhythm. Exam reveals no gallop and no friction rub.  No murmur heard. Pulmonary/Chest: Effort normal and breath sounds normal. No respiratory distress. She has no wheezes. Right breast exhibits no mass, no skin change and no tenderness. Left breast exhibits no mass, no skin change and no tenderness.  Abdominal: Soft. Bowel sounds are normal. She exhibits no distension. There is no tenderness. There is no rebound.  Musculoskeletal: Normal range of motion.  Neurological: She is alert and oriented to person, place, and time. No cranial nerve deficit.  Skin: Skin is warm and Garza.  Psychiatric: She has a normal mood and affect. Judgment normal.  Vitals reviewed.   Assessment:  ANNUAL EXAM 1. Annual physical exam   2. Dyspareunia due to medical condition in  female   3. Anal skin tag    Screening Plan:            1.  Cervical Screening-  Pap smear schedule reviewed with patient  2. Breast screening- Exam annually and mammogram>40 planned   3. Colonoscopy every 10 years, Hemoccult testing - after age 3  4. Labs managed by PCP  5. Counseling for contraception: hyst   6. Dyspareunia, improving, consider MS component (Kegels) and psychological component for this.  Improved libido as well.  7. Anal skin tag - Ambulatory referral to General Surgery (or address at GI visit next week)    F/U  Return in about 1 year (around 01/17/2019) for Annual.  Barnett Applebaum, Tammy Garza, [facility], [facility] 01/16/2018  9:57 AM

## 2018-01-16 NOTE — Patient Instructions (Signed)
PAP every three years Mammogram every year once you turn 40 Labs yearly (with PCP)

## 2018-01-22 ENCOUNTER — Encounter: Payer: Self-pay | Admitting: Surgery

## 2018-01-22 ENCOUNTER — Ambulatory Visit: Payer: 59 | Admitting: Surgery

## 2018-01-22 VITALS — BP 112/66 | HR 80 | Temp 98.1°F | Ht 66.0 in | Wt 187.0 lb

## 2018-01-22 DIAGNOSIS — K648 Other hemorrhoids: Secondary | ICD-10-CM | POA: Diagnosis not present

## 2018-01-22 NOTE — Progress Notes (Signed)
01/22/2018  Reason for Visit:  Internal Hemorrhoid  Referring Provider:  Teresa Coombs, MD  History of Present Illness: Tammy Garza is a 37 y.o. female referred by Dr. Kenton Kingfisher for evaluation of possible anal skin tag.  Patient reports that she has noticed since the beginning of this year possibly the end of last year that she has had bleeding per rectum that she notices on the toilet tissue paper.  She does not notice any blood in the stool or in the toilet bowl.  This however does happen with each bowel movement.  She denies having any constipation reports that initially her stool is well formed and then becomes loose like diarrhea each time.  She does take MiraLAX as needed.  However this has not improved the bleeding.  She denies having any pain with the bleeding or other discomfort symptoms like itchiness she does not feel anything protruding through the anal opening with each bowel movement.  She has not tried any sitz bath's or fiber or other medications.  Past Medical History: Past Medical History:  Diagnosis Date  . Anemia    h/o  . Cervical high risk human papillomavirus (HPV) DNA test positive   . Chronic gastritis   . Depression   . DVT (deep venous thrombosis) ([place]) 09/2016   left leg  . GERD (gastroesophageal reflux disease)   . H. pylori infection   . History of attempted suicide   . History of gastric polyp   . History of kidney stones    h/o  . Migraines   . Protein C deficiency ([place])   . Rash   . Shift work sleep disorder      Past Surgical History: Past Surgical History:  Procedure Laterality Date  . ABDOMINAL HYSTERECTOMY    . ANTERIOR CRUCIATE LIGAMENT REPAIR Left   . CHOLECYSTECTOMY    . CYSTOSCOPY  12/19/2016   Procedure: CYSTOSCOPY;  Surgeon: Gae Dry, MD;  Location: [facility] ORS;  Service: Gynecology;;  . ESOPHAGOGASTRODUODENOSCOPY (EGD) WITH PROPOFOL N/A 09/25/2017   Procedure: ESOPHAGOGASTRODUODENOSCOPY (EGD) WITH PROPOFOL;  Surgeon: Jonathon Bellows,  MD;  Location: [facility] ENDOSCOPY;  Service: Gastroenterology;  Laterality: N/A;  . LAPAROSCOPIC HYSTERECTOMY Bilateral 12/19/2016   Procedure: HYSTERECTOMY TOTAL LAPAROSCOPIC BILATERAL SALPINGECTOMY;  Surgeon: Gae Dry, MD;  Location: [facility] ORS;  Service: Gynecology;  Laterality: Bilateral;  . TUBAL LIGATION      Home Medications: Prior to Admission medications   Medication Sig Start Date End Date Taking? Authorizing Provider  apixaban (ELIQUIS) 5 MG TABS tablet Take 1 tablet (5 mg total) by mouth 2 (two) times daily. 01/15/18  Yes Lequita Asal, MD  chlorproMAZINE (THORAZINE) 25 MG tablet TAKE 1-2TABS AS NEEDED FOR HEADACHE RESCUE MAY REPEAT IN 2-3HOURS MAX 4 TABS/24 HOURS 05/10/17  Yes Orie Rout, MD  gabapentin (NEURONTIN) 600 MG tablet Take 600 mg by mouth daily. 11/09/17  Yes [provider]  gabapentin (NEURONTIN) 300 MG capsule Take 600 mg by mouth daily.  09/11/17   [provider]  glycopyrrolate (ROBINUL) 1 MG tablet Take 3 mg by mouth daily. 01/29/17   Dasher, Rayvon Char, MD  Vitamin D, Ergocalciferol, (DRISDOL) 50000 units CAPS capsule TAKE 1 CAPSULE (50,000 UNITS TOTAL) BY MOUTH EVERY 7 (SEVEN) DAYS. 09/19/17   [name], MD    Allergies: No Known Allergies  Social History:  reports that she quit smoking about 10 years ago. Her smoking use included cigarettes. She started smoking about 15 years ago. She has a 1.25 pack-year smoking  history. She has never used smokeless tobacco. She reports that she drinks about 1.0 standard drinks of alcohol per week. She reports that she does not use drugs.   Family History: Family History  Problem Relation Age of Onset  . HIV Sister   . Cancer Sister 32       cervical cancer  . Asthma Daughter   . Protein C deficiency Daughter   . Hypertension Maternal Grandmother   . Hypertension Mother   . Obesity Mother   . Cancer Father 76       lung cancer  . Deep vein thrombosis Father   . Protein C deficiency  Father   . COPD Father   . Diabetes Brother   . COPD Paternal Grandmother   . Deep vein thrombosis Paternal Grandmother   . Protein C deficiency Daughter     Review of Systems: Review of Systems  Constitutional: Negative for chills and fever.  Respiratory: Negative for shortness of breath.   Cardiovascular: Negative for chest pain.  Gastrointestinal: Negative for abdominal pain, blood in stool (blood in tissue paper) and nausea.  Genitourinary: Negative for dysuria.    Physical Exam BP 112/66   Pulse 80   Temp 98.1 F (36.7 C) (Oral)   Ht 5\' 6"  (1.676 m)   Wt 187 lb (84.8 kg)   LMP 01/04/2016   BMI 30.18 kg/m  CONSTITUTIONAL: No acute distress HEENT:  Normocephalic, atraumatic, extraocular motion intact.  RESPIRATORY:  Lungs are clear, and breath sounds are equal bilaterally. Normal respiratory effort without pathologic use of accessory muscles. CARDIOVASCULAR: Heart is regular without murmurs, gallops, or rubs. GI: The abdomen is soft, nondistended, nontender to palpation. RECTAL: External exam reveals anal skin tags externally on the left and right sides with no evidence of any bleeding, excoriation, cuts, or infection.  External hemorrhoids are not thrombosed or swollen.  Digital rectal exam does not reveal any ulcers or fissures but does show raw surface over the left lateral column.  There is no significant engorgement of this.  No gross blood on glove. NEUROLOGIC:  Motor and sensation is grossly normal.  Cranial nerves are grossly intact. PSYCH:  Alert and oriented to person, place and time. Affect is normal.  Laboratory Analysis: None  Imaging: None  Assessment and Plan: This is a 37 y.o. female who presents with grade 2 internal hemorrhoids, particularly with raw surface of the left lateral hemorrhoid column.  The patient does have external anal skin tags but these are not the cause of the bleeding that she is noticing.  Discussed with the patient what internal  hemorrhoids are in some of the reasons for enlargement of bleeding.  Discussed with patient is important to find a good balance between constipation and diarrhea and that actually having diarrhea could contribute to making the hemorrhoids worse.  Encourage the patient to take fiber in order to make the stool spongier rather than taking MiraLAX alone.  Recommended also that she start doing sitz bath twice a day.  At this time she does not need any Anusol or lidocaine.  We will give the patient about a month to see how she was is doing with this treatment plan.  Discussed with the patient that if there is no improvement she continues to bleed, then we would discuss doing an exam under anesthesia possible hemorrhoidectomy.  She will follow-up in a month.  Face-to-face time spent with the patient and care providers was 40 minutes, with more than 50% of the time  spent counseling, educating, and coordinating care of the patient.     Melvyn Neth, [facility]

## 2018-01-22 NOTE — Patient Instructions (Signed)
Please increase fiber daily. Please be sure to drink plenty of water.  Hemorrhoids Hemorrhoids are swollen veins in and around the rectum or anus. Hemorrhoids can cause pain, itching, or bleeding. Most of the time, they do not cause serious problems. They usually get better with diet changes, lifestyle changes, and other home treatments. Follow these instructions at home: Eating and drinking  Eat foods that have fiber, such as whole grains, beans, nuts, fruits, and vegetables. Ask your doctor about taking products that have added fiber (fibersupplements).  Drink enough fluid to keep your pee (urine) clear or pale yellow. For Pain and Swelling  Take a warm-water bath (sitz bath) for 20 minutes to ease pain. Do this 3-4 times a day.  If directed, put ice on the painful area. It may be helpful to use ice between your warm baths. ? Put ice in a plastic bag. ? Place a towel between your skin and the bag. ? Leave the ice on for 20 minutes, 2-3 times a day. General instructions  Take over-the-counter and prescription medicines only as told by your doctor. ? Medicated creams and medicines that are inserted into the anus (suppositories) may be used or applied as told.  Exercise often.  Go to the bathroom when you have the urge to poop (to have a bowel movement). Do not wait.  Avoid pushing too hard (straining) when you poop.  Keep the butt area dry and clean. Use wet toilet paper or moist paper towels.  Do not sit on the toilet for a long time. Contact a doctor if:  You have any of these: ? Pain and swelling that do not get better with treatment or medicine. ? Bleeding that will not stop. ? Trouble pooping or you cannot poop. ? Pain or swelling outside the area of the hemorrhoids. This information is not intended to replace advice given to you by your health care provider. Make sure you discuss any questions you have with your health care provider. Document Released: 02/08/2008  Document Revised: 10/07/2015 Document Reviewed: 01/13/2015 Elsevier Interactive Patient Education  2018 [employer].    How to Take a [employer] A sitz bath is a warm water bath that is taken while you are sitting down. The water should only come up to your hips and should cover your buttocks. Your health care provider may recommend a sitz bath to help you:  Clean the lower part of your body, including your genital area.  With itching.  With pain.  With sore muscles or muscles that tighten or spasm.  How to take a sitz bath Take 3-4 sitz baths per day or as told by your health care provider. 1. Partially fill a bathtub with warm water. You will only need the water to be deep enough to cover your hips and buttocks when you are sitting in it. 2. If your health care provider told you to put medicine in the water, follow the directions exactly. 3. Sit in the water and open the tub drain a little. 4. Turn on the warm water again to keep the tub at the correct level. Keep the water running constantly. 5. Soak in the water for 15-20 minutes or as told by your health care provider. 6. After the sitz bath, pat the affected area dry first. Do not rub it. 7. Be careful when you stand up after the sitz bath because you may feel dizzy.  Contact a health care provider if:  Your symptoms get worse. Do not  continue with sitz baths if your symptoms get worse.  You have new symptoms. Do not continue with sitz baths until you talk with your health care provider. This information is not intended to replace advice given to you by your health care provider. Make sure you discuss any questions you have with your health care provider. Document Released: 01/22/2004 Document Revised: 09/29/2015 Document Reviewed: 04/29/2014 Elsevier Interactive Patient Education  2018 [employer] [place] A disposable sitz bath is a plastic basin that fits over the toilet. A bag is hung above the toilet,  and the bag is connected to a tube that opens into the basin. The bag is filled with warm water that flows into the basin through the tube. A sitz bath can be used to help relieve symptoms, clean, and promote healing in the genital and anal areas, as well as in the lower abdomen and buttocks. What are the risks? Sitz baths are generally very safe. It is possible for the skin between the genitals and the anus (perineum) to become infected, but this is rare. You can avoid this by cleaning your sitz bath supplies thoroughly. How to use a disposable sitz bath 1. Close the clamp on the tube. Make sure the clamp is closed tightly to prevent leakage. 2. Fill the sitz bath basin and the plastic bag with warm water. The water should be warm enough to be comfortable, but not hot. 3. Raise the toilet seat and place the filled basin on the toilet. Make sure the overflow opening is facing toward the back of the toilet. ? If you prefer, you may place the empty basin on the toilet first, and then use the plastic bag to fill the basin with warm water. 4. Hang the filled plastic bag overhead on a hook or towel rack close to the toilet. The bag should be higher than the toilet so that the water will flow down through the tube. 5. Attach the tube to the opening on the basin. Make sure that the tube is attached to the basin tightly to prevent leakage. 6. Sit on the basin and release the clamp. This will allow warm water to flow into the basin and flush the area around your genitals and anus. 7. Remain sitting on the basin for about 15-20 minutes, or as long as told by your health care provider. 8. Stand up and gently pat your skin dry. If directed, apply clean bandages (dressings) to the affected area as told by your health care provider. 9. Carefully remove the basin from the toilet seat and tip the basin into the toilet to empty any remaining water. Empty any remaining water from the plastic bag into the toilet. Then,  flush the toilet. 10. Wash the basin with warm water and soap. Let the basin air dry in the sink. You should also let the plastic bag and the tubing air dry. 11. Store the basin, tubing, and plastic bag in a clean, dry area. 12. Wash your hands with soap and water. If soap and water are not available, use hand sanitizer. Contact a health care provider if:  You have symptoms that get worse instead of better.  You develop new skin irritation, redness, or swelling around your genitals or anus. This information is not intended to replace advice given to you by your health care provider. Make sure you discuss any questions you have with your health care provider. Document Released: 10/31/2011 Document Revised: 10/07/2015 Document Reviewed: 03/21/2015 Elsevier Interactive Patient  Education  2018 [employer]  

## 2018-01-23 ENCOUNTER — Ambulatory Visit: Payer: 59 | Admitting: Gastroenterology

## 2018-01-23 DIAGNOSIS — M25561 Pain in right knee: Secondary | ICD-10-CM | POA: Diagnosis not present

## 2018-01-23 DIAGNOSIS — M222X1 Patellofemoral disorders, right knee: Secondary | ICD-10-CM | POA: Diagnosis not present

## 2018-02-08 DIAGNOSIS — M25561 Pain in right knee: Secondary | ICD-10-CM | POA: Diagnosis not present

## 2018-02-08 DIAGNOSIS — M222X1 Patellofemoral disorders, right knee: Secondary | ICD-10-CM | POA: Diagnosis not present

## 2018-02-20 ENCOUNTER — Ambulatory Visit: Payer: 59 | Admitting: Family Medicine

## 2018-02-20 ENCOUNTER — Encounter: Payer: Self-pay | Admitting: Family Medicine

## 2018-02-20 VITALS — BP 112/68 | HR 78 | Temp 98.3°F | Resp 16 | Ht 66.0 in | Wt 186.2 lb

## 2018-02-20 DIAGNOSIS — Z79899 Other long term (current) drug therapy: Secondary | ICD-10-CM | POA: Diagnosis not present

## 2018-02-20 DIAGNOSIS — E041 Nontoxic single thyroid nodule: Secondary | ICD-10-CM

## 2018-02-20 DIAGNOSIS — G43109 Migraine with aura, not intractable, without status migrainosus: Secondary | ICD-10-CM

## 2018-02-20 DIAGNOSIS — E669 Obesity, unspecified: Secondary | ICD-10-CM

## 2018-02-20 DIAGNOSIS — D649 Anemia, unspecified: Secondary | ICD-10-CM

## 2018-02-20 DIAGNOSIS — Z131 Encounter for screening for diabetes mellitus: Secondary | ICD-10-CM

## 2018-02-20 DIAGNOSIS — D6859 Other primary thrombophilia: Secondary | ICD-10-CM | POA: Diagnosis not present

## 2018-02-20 DIAGNOSIS — R61 Generalized hyperhidrosis: Secondary | ICD-10-CM

## 2018-02-20 DIAGNOSIS — E559 Vitamin D deficiency, unspecified: Secondary | ICD-10-CM

## 2018-02-20 DIAGNOSIS — Z86718 Personal history of other venous thrombosis and embolism: Secondary | ICD-10-CM

## 2018-02-20 MED ORDER — INSULIN PEN NEEDLE 32G X 6 MM MISC: 1.0000 | Freq: Two times a day (BID) | 2 refills | Status: DC

## 2018-02-20 MED ORDER — LIRAGLUTIDE -WEIGHT MANAGEMENT 18 MG/3ML ~~LOC~~ SOPN: 3.0000 mg | PEN_INJECTOR | Freq: Two times a day (BID) | SUBCUTANEOUS | 0 refills | Status: DC

## 2018-02-20 NOTE — Progress Notes (Signed)
Name: Tammy Garza   MRN: [id number]    DOB: 1980-07-18   Date:02/20/2018       Progress Note  Subjective  Chief Complaint  Chief Complaint  Patient presents with  . Weight Gain  . Immunizations    decline flu shot    HPI  Protein C deficiency: sees Dr. Mike Gip and is on long term Eliquis since diagnosed with first DVT back in May 2018. No recent episodes of DVT or calf pain   Obesity: she was started on Saxenda August 2017 weight was 188 lbs and was doing great, she hadlost 24 lbs by October 2017, she got as low as 154 lbs but she felt like she was too skinny and decreased dose of medication ( down to 2.4 daily), she states when she stopped medication her appetite increased quickly and she gained weight. Over the past 6 months she has 12 lbs. Today's weight is 186.2 lbs and she is very upset about it. She was seen by Benjamine Mola earlier this year and insurance did not cover Belviq . She would like to try Saxenda again. She is eating healthy, packing snacks and dinner for work. Exercising on a regular basis 3-4 times weekly but is still gaining weight .   Anemia/mild on last CBC: she had a hysterectomy 12/2016 but has rectal bleeding from hemorrhoids, seen by surgeon but given reassurance, advised to consider seeing Dr. Vicente Males if symptoms persists.   Migraine headaches: she is taking gabapentin for the past year, sees Dr. Domingo Cocking , episodes down to 3-4 times per month, each episode lasts about 2 days. She has associated nausea, photophobia and phonophobia. Pain is described as temporal throbbing like.   Thyroid cyst: lost to follow up with [place], appointment scheduled for next week     Patient Active Problem List   Diagnosis Date Noted  . Internal hemorrhoid 01/22/2018  . Protein C deficiency ([place]) 12/10/2016  . Monoclonal gammopathy of unknown significance (MGUS) 11/30/2016  . History of DVT (deep vein thrombosis) 11/02/2016  . Elevated blood protein 09/26/2016  . Chronic deep vein  thrombosis (DVT) of distal vein of left lower extremity ([place]) 09/17/2016  . Vitamin D deficiency 07/26/2015  . Thyroid cyst 07/26/2015  . History of Helicobacter pylori infection 04/22/2015  . Migraine with aura and without status migrainosus 04/22/2015  . Shift work sleep disorder 04/22/2015    Past Surgical History:  Procedure Laterality Date  . ABDOMINAL HYSTERECTOMY    . ANTERIOR CRUCIATE LIGAMENT REPAIR Left   . CHOLECYSTECTOMY    . CYSTOSCOPY  12/19/2016   Procedure: CYSTOSCOPY;  Surgeon: Gae Dry, MD;  Location: [facility] ORS;  Service: Gynecology;;  . ESOPHAGOGASTRODUODENOSCOPY (EGD) WITH PROPOFOL N/A 09/25/2017   Procedure: ESOPHAGOGASTRODUODENOSCOPY (EGD) WITH PROPOFOL;  Surgeon: Jonathon Bellows, MD;  Location: [facility] ENDOSCOPY;  Service: Gastroenterology;  Laterality: N/A;  . LAPAROSCOPIC HYSTERECTOMY Bilateral 12/19/2016   Procedure: HYSTERECTOMY TOTAL LAPAROSCOPIC BILATERAL SALPINGECTOMY;  Surgeon: Gae Dry, MD;  Location: [facility] ORS;  Service: Gynecology;  Laterality: Bilateral;  . TUBAL LIGATION      Family History  Problem Relation Age of Onset  . HIV Sister   . Cancer Sister 59       cervical cancer  . Asthma Daughter   . Protein C deficiency Daughter   . Hypertension Maternal Grandmother   . Hypertension Mother   . Obesity Mother   . Cancer Father 26       lung cancer  . Deep vein thrombosis Father   .  Protein C deficiency Father   . COPD Father   . Diabetes Brother   . COPD Paternal Grandmother   . Deep vein thrombosis Paternal Grandmother   . Protein C deficiency Daughter     Social History   Socioeconomic History  . Marital status: Divorced    Spouse name: Not on file  . Number of children: 2  . Years of education: 30  . Highest education level: Associate degree: occupational, Hotel manager, or vocational program  Occupational History  . Not on file  Social Needs  . Financial resource strain: Somewhat hard  . Food insecurity:    Worry: Sometimes  true    Inability: Sometimes true  . Transportation needs:    Medical: No    Non-medical: No  Tobacco Use  . Smoking status: Former Smoker    Packs/day: 0.25    Years: 5.00    Pack years: 1.25    Types: Cigarettes    Start date: 05/15/2002    Last attempt to quit: 05/16/2007    Years since quitting: 10.7  . Smokeless tobacco: Never Used  Substance and Sexual Activity  . Alcohol use: Yes    Alcohol/week: 1.0 standard drinks    Types: 1 Glasses of wine per week    Comment: occ wine none last 24 hrs  . Drug use: No  . Sexual activity: Yes    Partners: Male    Birth control/protection: Surgical    Comment: hysterectomy   Lifestyle  . Physical activity:    Days per week: 3 days    Minutes per session: 60 min  . Stress: Not at all  Relationships  . Social connections:    Talks on phone: More than three times a week    Gets together: Once a week    Attends religious service: More than 4 times per year    Active member of club or organization: Yes    Attends meetings of clubs or organizations: 1 to 4 times per year    Relationship status: Divorced  . Intimate partner violence:    Fear of current or ex partner: No    Emotionally abused: No    Physically abused: No    Forced sexual activity: No  Other Topics Concern  . Not on file  Social History Narrative   Separated since June 2016 - she has two children at home . The youngest is his daughter. Working at [employer] in [place]. She works at [employer]. She had been laid off by back since 2017.    Dating Gwyndolyn Saxon since 2015     Current Outpatient Medications:  .  apixaban (ELIQUIS) 5 MG TABS tablet, Take 1 tablet (5 mg total) by mouth 2 (two) times daily., Disp: 60 tablet, Rfl: 0 .  gabapentin (NEURONTIN) 600 MG tablet, Take 600 mg by mouth daily., Disp: , Rfl: 5 .  glycopyrrolate (ROBINUL) 1 MG tablet, Take 3 mg by mouth daily., Disp: , Rfl: 2  No Known Allergies  I personally reviewed active problem list, medication  list, allergies, family history, social history with the patient/caregiver today.   ROS  Constitutional: Negative for fever, positive for  weight change.  Respiratory: Negative for cough and shortness of breath.   Cardiovascular: Negative for chest pain or palpitations.  Gastrointestinal: Negative for abdominal pain, no bowel changes.  Musculoskeletal: Negative for gait problem or joint swelling.  Skin: Negative for rash.  Neurological: Negative for dizziness or headache.  No other specific complaints in a  complete review of systems (except as listed in HPI above).  Objective  Vitals:   02/20/18 1328  BP: 112/68  Pulse: 78  Resp: 16  Temp: 98.3 F (36.8 C)  TempSrc: Other (Comment)  SpO2: 95%  Weight: 186 lb 3.2 oz (84.5 kg)  Height: 5' 6"  (1.676 m)    Body mass index is 30.05 kg/m.  Physical Exam  Constitutional: Patient appears well-developed and well-nourished. Obese No distress.  HEENT: head atraumatic, normocephalic, pupils equal and reactive to light,  neck supple, throat within normal limits. No thyromegaly today  Cardiovascular: Normal rate, regular rhythm and normal heart sounds.  No murmur heard. No BLE edema. Pulmonary/Chest: Effort normal and breath sounds normal. No respiratory distress. Abdominal: Soft.  There is no tenderness. Psychiatric: Patient has a normal mood and affect. behavior is normal. Judgment and thought content normal.  Recent Results (from the past 2160 hour(s))  Comprehensive metabolic panel     Status: Abnormal   Collection Time: 01/04/18  1:47 PM  Result Value Ref Range   Sodium 139 135 - 145 mmol/L   Potassium 3.7 3.5 - 5.1 mmol/L   Chloride 108 98 - 111 mmol/L   CO2 24 22 - 32 mmol/L   Glucose, Bld 108 (H) 70 - 99 mg/dL   BUN 14 6 - 20 mg/dL   Creatinine, Ser 0.74 0.44 - 1.00 mg/dL   Calcium 8.7 (L) 8.9 - 10.3 mg/dL   Total Protein 7.1 6.5 - 8.1 g/dL   Albumin 4.0 3.5 - 5.0 g/dL   AST 18 15 - 41 U/L   ALT 12 0 - 44 U/L    Alkaline Phosphatase 50 38 - 126 U/L   Total Bilirubin 0.7 0.3 - 1.2 mg/dL   GFR calc non Af Amer >60 >60 mL/min   GFR calc Af Amer >60 >60 mL/min    Comment: (NOTE) The eGFR has been calculated using the CKD EPI equation. This calculation has not been validated in all clinical situations. eGFR's persistently <60 mL/min signify possible Chronic Kidney Disease.    Anion gap 7 5 - 15    Comment: Performed at [facility], [address] [place], [place] [address]  CBC with Differential/Platelet     Status: Abnormal   Collection Time: 01/04/18  1:47 PM  Result Value Ref Range   WBC 8.4 3.6 - 11.0 K/uL   RBC 3.67 (L) 3.80 - 5.20 MIL/uL   Hemoglobin 11.6 (L) 12.0 - 16.0 g/dL   HCT 34.5 (L) 35.0 - 47.0 %   MCV 93.9 80.0 - 100.0 fL   MCH 31.6 26.0 - 34.0 pg   MCHC 33.6 32.0 - 36.0 g/dL   RDW 13.3 11.5 - 14.5 %   Platelets 232 150 - 440 K/uL   Neutrophils Relative % 53 %   Neutro Abs 4.5 1.4 - 6.5 K/uL   Lymphocytes Relative 34 %   Lymphs Abs 2.8 1.0 - 3.6 K/uL   Monocytes Relative 9 %   Monocytes Absolute 0.8 0.2 - 0.9 K/uL   Eosinophils Relative 3 %   Eosinophils Absolute 0.2 0 - 0.7 K/uL   Basophils Relative 1 %   Basophils Absolute 0.1 0 - 0.1 K/uL    Comment: Performed at [facility], [address] [place],  [address]     PHQ2/9: Depression screen [facility] 2/9 02/20/2018 08/20/2017 05/17/2017 11/20/2016 08/22/2016  Decreased Interest 0 0 0 0 0  Down, Depressed, Hopeless 1 0 0 0 0  PHQ -  2 Score 1 0 0 0 0  Altered sleeping 0 - - - -  Tired, decreased energy 0 - - - -  Change in appetite 0 - - - -  Feeling bad or failure about yourself  0 - - - -  Trouble concentrating 0 - - - -  Moving slowly or fidgety/restless 0 - - - -  Suicidal thoughts 0 - - - -  PHQ-9 Score 1 - - - -  Difficult doing work/chores Not difficult at all - - - -     Fall Risk: Fall Risk  02/20/2018 08/20/2017 05/17/2017 11/20/2016 08/22/2016  Falls in the past year? No No No No No      Functional Status Survey: Is the patient deaf or have difficulty hearing?: No Does the patient have difficulty seeing, even when wearing glasses/contacts?: No Does the patient have difficulty concentrating, remembering, or making decisions?: No Does the patient have difficulty walking or climbing stairs?: No Does the patient have difficulty dressing or bathing?: No Does the patient have difficulty doing errands alone such as visiting a doctor's office or shopping?: No    Assessment & Plan  1. Protein C deficiency ([place])  On Eliquis long term   2. Hyperhidrosis  Using topical medication, still has a lot symptoms on face and axilla   3. Thyroid cyst  - TSH She is due for repeat thyroid US  4. History of DVT (deep vein thrombosis)  Under the care of Dr. Mike Gip   5. Migraine with aura and without status migrainosus, not intractable  Sees Dr. Domingo Cocking   6. Obesity (BMI 30.0-34.9)  - Liraglutide -Weight Management (SAXENDA) 18 MG/3ML SOPN; Inject 3 mg into the skin 2 (two) times daily.  Dispense: 18 mL; Refill: 0 - Insulin Pen Needle 32G X 6 MM MISC; 1 each by Does not apply route 2 (two) times daily.  Dispense: 200 each; Refill: 2 - Lipid panel  7. Vitamin D deficiency  - VITAMIN D 25 Hydroxy (Vit-D Deficiency, Fractures)  8. Mild anemia  Sees Dr. Mike Gip, seen by surgeon for hemorrhoid problems, still has some bleeding, banding was not indicated, but advised to follow up with Dr. Vicente Males if symptoms persists  9. Diabetes mellitus screening  - Hemoglobin A1c  10. Long-term use of high-risk medication  - COMPLETE METABOLIC PANEL WITH GFR

## 2018-02-21 LAB — COMPLETE METABOLIC PANEL WITH GFR
AG Ratio: 1.6 (calc) (ref 1.0–2.5)
ALT: 9 U/L (ref 6–29)
AST: 12 U/L (ref 10–30)
Albumin: 4.2 g/dL (ref 3.6–5.1)
Alkaline phosphatase (APISO): 45 U/L (ref 33–115)
BUN: 18 mg/dL (ref 7–25)
CO2: 27 mmol/L (ref 20–32)
Calcium: 9.3 mg/dL (ref 8.6–10.2)
Chloride: 104 mmol/L (ref 98–110)
Creat: 0.74 mg/dL (ref 0.50–1.10)
GFR, Est African American: 119 mL/min/{1.73_m2} (ref >=60)
GFR, Est Non African American: 103 mL/min/{1.73_m2} (ref >=60)
Globulin: 2.7 g/dL (calc) (ref 1.9–3.7)
Glucose, Bld: 79 mg/dL (ref 65–139)
Potassium: 3.9 mmol/L (ref 3.5–5.3)
Sodium: 137 mmol/L (ref 135–146)
Total Bilirubin: 0.6 mg/dL (ref 0.2–1.2)
Total Protein: 6.9 g/dL (ref 6.1–8.1)

## 2018-02-21 LAB — HEMOGLOBIN A1C
Hgb A1c MFr Bld: 5.1 % of total Hgb (ref <5.7)
Mean Plasma Glucose: 100 (calc)
eAG (mmol/L): 5.5 (calc)

## 2018-02-21 LAB — LIPID PANEL
Cholesterol: 181 mg/dL (ref <200)
HDL: 65 mg/dL (ref >50)
LDL Cholesterol (Calc): 99 mg/dL (calc)
Non-HDL Cholesterol (Calc): 116 mg/dL (calc) (ref <130)
Total CHOL/HDL Ratio: 2.8 (calc) (ref <5.0)
Triglycerides: 84 mg/dL (ref <150)

## 2018-02-21 LAB — VITAMIN D 25 HYDROXY (VIT D DEFICIENCY, FRACTURES): Vit D, 25-Hydroxy: 21 ng/mL — ABNORMAL LOW (ref 30–100)

## 2018-02-21 LAB — TSH: TSH: 2.04 mIU/L

## 2018-02-22 ENCOUNTER — Encounter: Payer: Self-pay | Admitting: Family Medicine

## 2018-02-22 ENCOUNTER — Other Ambulatory Visit: Payer: Self-pay | Admitting: Family Medicine

## 2018-02-22 DIAGNOSIS — E669 Obesity, unspecified: Secondary | ICD-10-CM

## 2018-02-22 MED ORDER — LIRAGLUTIDE -WEIGHT MANAGEMENT 18 MG/3ML ~~LOC~~ SOPN: 3.0000 mg | PEN_INJECTOR | SUBCUTANEOUS | 0 refills | Status: DC

## 2018-02-26 ENCOUNTER — Ambulatory Visit
Admission: RE | Admit: 2018-02-26 | Discharge: 2018-02-26 | Disposition: A | Discharge status: Home / Self Care | Payer: 59 | Source: Ambulatory Visit | Attending: Nurse Practitioner | Admitting: Nurse Practitioner

## 2018-02-26 DIAGNOSIS — E041 Nontoxic single thyroid nodule: Secondary | ICD-10-CM | POA: Diagnosis not present

## 2018-02-27 ENCOUNTER — Ambulatory Visit: Payer: Self-pay | Admitting: Surgery

## 2018-02-28 DIAGNOSIS — M25562 Pain in left knee: Secondary | ICD-10-CM | POA: Diagnosis not present

## 2018-03-05 ENCOUNTER — Other Ambulatory Visit: Payer: Self-pay | Admitting: Hematology and Oncology

## 2018-03-05 MED ORDER — APIXABAN 5 MG PO TABS: 5.0000 mg | ORAL_TABLET | Freq: Two times a day (BID) | ORAL | 0 refills | Status: DC

## 2018-03-09 ENCOUNTER — Other Ambulatory Visit: Payer: Self-pay | Admitting: Hematology and Oncology

## 2018-03-25 ENCOUNTER — Encounter: Payer: Self-pay | Admitting: Hematology and Oncology

## 2018-04-08 ENCOUNTER — Inpatient Hospital Stay (HOSPITAL_BASED_OUTPATIENT_CLINIC_OR_DEPARTMENT_OTHER): Payer: 59 | Admitting: Hematology and Oncology

## 2018-04-08 ENCOUNTER — Encounter: Payer: Self-pay | Admitting: Hematology and Oncology

## 2018-04-08 ENCOUNTER — Other Ambulatory Visit: Payer: Self-pay

## 2018-04-08 ENCOUNTER — Inpatient Hospital Stay: Payer: 59 | Attending: Hematology and Oncology

## 2018-04-08 VITALS — BP 113/80 | HR 71 | Temp 97.9°F | Resp 16 | Ht 66.0 in | Wt 187.2 lb

## 2018-04-08 DIAGNOSIS — Z7901 Long term (current) use of anticoagulants: Secondary | ICD-10-CM

## 2018-04-08 DIAGNOSIS — Z87891 Personal history of nicotine dependence: Secondary | ICD-10-CM | POA: Diagnosis not present

## 2018-04-08 DIAGNOSIS — R779 Abnormality of plasma protein, unspecified: Secondary | ICD-10-CM

## 2018-04-08 DIAGNOSIS — Z79899 Other long term (current) drug therapy: Secondary | ICD-10-CM | POA: Insufficient documentation

## 2018-04-08 DIAGNOSIS — Z86718 Personal history of other venous thrombosis and embolism: Secondary | ICD-10-CM | POA: Insufficient documentation

## 2018-04-08 DIAGNOSIS — D472 Monoclonal gammopathy: Secondary | ICD-10-CM | POA: Insufficient documentation

## 2018-04-08 DIAGNOSIS — I825Z2 Chronic embolism and thrombosis of unspecified deep veins of left distal lower extremity: Secondary | ICD-10-CM

## 2018-04-08 DIAGNOSIS — D6859 Other primary thrombophilia: Secondary | ICD-10-CM

## 2018-04-08 DIAGNOSIS — E8809 Other disorders of plasma-protein metabolism, not elsewhere classified: Secondary | ICD-10-CM

## 2018-04-08 LAB — COMPREHENSIVE METABOLIC PANEL
ALT: 14 U/L (ref 0–44)
AST: 16 U/L (ref 15–41)
Albumin: 4.4 g/dL (ref 3.5–5.0)
Alkaline Phosphatase: 43 U/L (ref 38–126)
Anion gap: 7 (ref 5–15)
BUN: 8 mg/dL (ref 6–20)
CO2: 26 mmol/L (ref 22–32)
Calcium: 8.8 mg/dL — ABNORMAL LOW (ref 8.9–10.3)
Chloride: 104 mmol/L (ref 98–111)
Creatinine, Ser: 0.68 mg/dL (ref 0.44–1.00)
GFR calc Af Amer: >60 mL/min (ref >60)
GFR calc non Af Amer: >60 mL/min (ref >60)
Glucose, Bld: 76 mg/dL (ref 70–99)
Potassium: 4.3 mmol/L (ref 3.5–5.1)
Sodium: 137 mmol/L (ref 135–145)
Total Bilirubin: 0.9 mg/dL (ref 0.3–1.2)
Total Protein: 7.3 g/dL (ref 6.5–8.1)

## 2018-04-08 LAB — CBC WITH DIFFERENTIAL/PLATELET
Abs Immature Granulocytes: 0.02 10*3/uL (ref 0.00–0.07)
Basophils Absolute: 0.1 10*3/uL (ref 0.0–0.1)
Basophils Relative: 1 %
Eosinophils Absolute: 0.2 10*3/uL (ref 0.0–0.5)
Eosinophils Relative: 2 %
HCT: 40.2 % (ref 36.0–46.0)
Hemoglobin: 12.8 g/dL (ref 12.0–15.0)
Immature Granulocytes: 0 %
Lymphocytes Relative: 43 %
Lymphs Abs: 3.3 10*3/uL (ref 0.7–4.0)
MCH: 29.8 pg (ref 26.0–34.0)
MCHC: 31.8 g/dL (ref 30.0–36.0)
MCV: 93.5 fL (ref 80.0–100.0)
Monocytes Absolute: 0.8 10*3/uL (ref 0.1–1.0)
Monocytes Relative: 10 %
Neutro Abs: 3.4 10*3/uL (ref 1.7–7.7)
Neutrophils Relative %: 44 %
Platelets: 273 10*3/uL (ref 150–400)
RBC: 4.3 MIL/uL (ref 3.87–5.11)
RDW: 11.8 % (ref 11.5–15.5)
WBC: 7.7 10*3/uL (ref 4.0–10.5)
nRBC: 0 % (ref 0.0–0.2)

## 2018-04-08 NOTE — Progress Notes (Signed)
Patient here for follow up see follow upt note.

## 2018-04-08 NOTE — Progress Notes (Signed)
[facility] day:  04/08/2018   Chief Complaint: Daphene Chisholm is a 37 y.o. female with a DVT of the left lower extremity and protein C deficiency on Eliquis who is seen for 6 month assessment.  HPI:  The patient was last seen in the hematology clinic on 10/05/2017.  At that time, she felt well. She was recovering well from knee surgery. Exam revealed no adenopathy or hepatosplenomegaly.  SPEP revealed no monoclonal protein.  IgG was 698 ([phone]).  Freee light chain ratio was 1.03 (normal).  Symptomatically, patient is doing well. She denies any acute concerns. She is essentially back to her pre-surgical PLOF following ACL reconstruction. She notes that there is some residual swelling when she in standing for an extended period of time. Patient remains on chronic apixaban therapy with no increased bruising or bleeding noted.  Patient denies that she has experienced any B symptoms. She denies any interval infections. Patient advises that she maintains an adequate appetite. She is eating well. Weight today is 187 lb 3.2 oz (84.9 kg), which compared to her last visit to the clinic, represents a 23 pound increase.     Patient denies pain in the clinic today.   Past Medical History:  Diagnosis Date  . Anemia    h/o  . Cervical high risk human papillomavirus (HPV) DNA test positive   . Chronic gastritis   . Depression   . DVT (deep venous thrombosis) ([place]) 09/2016   left leg  . GERD (gastroesophageal reflux disease)   . H. pylori infection   . History of attempted suicide   . History of gastric polyp   . History of kidney stones    h/o  . Migraines   . Protein C deficiency ([place])   . Rash   . Shift work sleep disorder     Past Surgical History:  Procedure Laterality Date  . ABDOMINAL HYSTERECTOMY    . ANTERIOR CRUCIATE LIGAMENT REPAIR Left   . CHOLECYSTECTOMY    . CYSTOSCOPY  12/19/2016   Procedure: CYSTOSCOPY;  Surgeon: Gae Dry,  MD;  Location: [facility] ORS;  Service: Gynecology;;  . ESOPHAGOGASTRODUODENOSCOPY (EGD) WITH PROPOFOL N/A 09/25/2017   Procedure: ESOPHAGOGASTRODUODENOSCOPY (EGD) WITH PROPOFOL;  Surgeon: Jonathon Bellows, MD;  Location: [facility] ENDOSCOPY;  Service: Gastroenterology;  Laterality: N/A;  . LAPAROSCOPIC HYSTERECTOMY Bilateral 12/19/2016   Procedure: HYSTERECTOMY TOTAL LAPAROSCOPIC BILATERAL SALPINGECTOMY;  Surgeon: Gae Dry, MD;  Location: [facility] ORS;  Service: Gynecology;  Laterality: Bilateral;  . TUBAL LIGATION      Family History  Problem Relation Age of Onset  . HIV Sister   . Cancer Sister 35       cervical cancer  . Asthma Daughter   . Protein C deficiency Daughter   . Hypertension Maternal Grandmother   . Hypertension Mother   . Obesity Mother   . Cancer Father 30       lung cancer  . Deep vein thrombosis Father   . Protein C deficiency Father   . COPD Father   . Diabetes Brother   . COPD Paternal Grandmother   . Deep vein thrombosis Paternal Grandmother   . Protein C deficiency Daughter     Social History:  reports that she quit smoking about 10 years ago. Her smoking use included cigarettes. She started smoking about 15 years ago. She has a 1.25 pack-year smoking history. She has never used smokeless tobacco. She reports that she drinks about 1.0 standard drinks  of alcohol per week. She reports that she does not use drugs.  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  She wears a mask at work.  She has 2 daughters (ages 30 and 52).  The patient is alone today.  Allergies:  Allergies  Allergen Reactions  . Topamax [Topiramate]     Confusion , slurred speech     Current Medications: Current Outpatient Medications  Medication Sig Dispense Refill  . apixaban (ELIQUIS) 5 MG TABS tablet Take 1 tablet (5 mg total) by mouth 2 (two) times daily. 60 tablet 0  . gabapentin (NEURONTIN) 600 MG tablet Take 600 mg by mouth daily.  5  . glycopyrrolate (ROBINUL)  1 MG tablet Take 3 mg by mouth daily.  2  . Insulin Pen Needle 32G X 6 MM MISC 1 each by Does not apply route 2 (two) times daily. 200 each 2  . Liraglutide -Weight Management (SAXENDA) 18 MG/3ML SOPN Inject 3 mg into the skin every morning. 18 mL 0   No current facility-administered medications for this visit.     Review of Systems  Constitutional: Negative for chills, diaphoresis, fever, malaise/fatigue and weight loss (up 23 pounds).       Doing well.  HENT: Negative for congestion, nosebleeds, sinus pain and sore throat.   Eyes: Negative.  Negative for blurred vision and double vision.  Respiratory: Negative.  Negative for cough, hemoptysis, sputum production and shortness of breath.   Cardiovascular: Negative.  Negative for chest pain, palpitations, orthopnea, leg swelling and PND.  Gastrointestinal: Negative.  Negative for abdominal pain, blood in stool, constipation, diarrhea, melena, nausea and vomiting.  Genitourinary: Negative.  Negative for dysuria, frequency, hematuria and urgency.  Musculoskeletal: Negative.  Negative for back pain, falls, joint pain, myalgias and neck pain.       S/p knee surgery. No issues with ambulation. Intermittent left leg swelling.  Skin: Negative.  Negative for itching and rash.  Neurological: Negative.  Negative for dizziness, tremors, speech change, focal weakness, weakness and headaches.  Endo/Heme/Allergies: Negative.  Does not bruise/bleed easily.  Psychiatric/Behavioral: Negative.  Negative for depression and memory loss. The patient is not nervous/anxious and does not have insomnia.   All other systems reviewed and are negative.  Performance status (ECOG):0  Physical Exam: Blood pressure 113/80, pulse 71, temperature 97.9 F (36.6 C), resp. rate 16, height _0  (1.676 m), weight 187 lb 3.2 oz (84.9 kg), last menstrual period 01/04/2016, SpO2 100 %. GENERAL:  Well developed, well nourished, woman sitting comfortably in the exam room in no  acute distress. MENTAL STATUS:  Alert and oriented to person, place and time. HEAD:  Short black hair.  Normocephalic, atraumatic, face symmetric, no Cushingoid features. EYES:  Brown eyes.  Pupils equal round and reactive to light and accomodation.  No conjunctivitis or scleral icterus. ENT:  Oropharynx clear without lesion.  Tongue normal. Mucous membranes moist.  RESPIRATORY:  Clear to auscultation without rales, wheezes or rhonchi. CARDIOVASCULAR:  Regular rate and rhythm without murmur, rub or gallop. ABDOMEN:  Soft, non-tender, with active bowel sounds, and no hepatosplenomegaly.  No masses. SKIN:  Tattoo.  No rashes, ulcers or lesions. EXTREMITIES: No edema, no skin discoloration or tenderness.  No palpable cords. LYMPH NODES: No palpable cervical, supraclavicular, axillary or inguinal adenopathy  NEUROLOGICAL: Unremarkable. PSYCH:  Appropriate.    Appointment on 04/08/2018  Component Date Value Ref Range Status  . Sodium 04/08/2018 137  135 - 145 mmol/L  Final  . Potassium 04/08/2018 4.3  3.5 - 5.1 mmol/L Final  . Chloride 04/08/2018 104  98 - 111 mmol/L Final  . CO2 04/08/2018 26  22 - 32 mmol/L Final  . Glucose, Bld 04/08/2018 76  70 - 99 mg/dL Final  . BUN 04/08/2018 8  6 - 20 mg/dL Final  . Creatinine, Ser 04/08/2018 0.68  0.44 - 1.00 mg/dL Final  . Calcium 04/08/2018 8.8* 8.9 - 10.3 mg/dL Final  . Total Protein 04/08/2018 7.3  6.5 - 8.1 g/dL Final  . Albumin 04/08/2018 4.4  3.5 - 5.0 g/dL Final  . AST 04/08/2018 16  15 - 41 U/L Final  . ALT 04/08/2018 14  0 - 44 U/L Final  . Alkaline Phosphatase 04/08/2018 43  38 - 126 U/L Final  . Total Bilirubin 04/08/2018 0.9  0.3 - 1.2 mg/dL Final  . GFR calc non Af Amer 04/08/2018 >60  >60 mL/min Final  . GFR calc Af Amer 04/08/2018 >60  >60 mL/min Final   Comment: (NOTE) The eGFR has been calculated using the CKD EPI equation. This calculation has not been validated in all clinical situations. eGFR's persistently <60 mL/min  signify possible Chronic Kidney Disease.   Georgiann Hahn gap 04/08/2018 7  5 - 15 Final   Performed at [facility], [address] [place], [place] [address]  . WBC 04/08/2018 7.7  4.0 - 10.5 K/uL Final  . RBC 04/08/2018 4.30  3.87 - 5.11 MIL/uL Final  . Hemoglobin 04/08/2018 12.8  12.0 - 15.0 g/dL Final  . HCT 04/08/2018 40.2  36.0 - 46.0 % Final  . MCV 04/08/2018 93.5  80.0 - 100.0 fL Final  . MCH 04/08/2018 29.8  26.0 - 34.0 pg Final  . MCHC 04/08/2018 31.8  30.0 - 36.0 g/dL Final  . RDW 04/08/2018 11.8  11.5 - 15.5 % Final  . Platelets 04/08/2018 273  150 - 400 K/uL Final  . nRBC 04/08/2018 0.0  0.0 - 0.2 % Final  . Neutrophils Relative % 04/08/2018 44  % Final  . Neutro Abs 04/08/2018 3.4  1.7 - 7.7 K/uL Final  . Lymphocytes Relative 04/08/2018 43  % Final  . Lymphs Abs 04/08/2018 3.3  0.7 - 4.0 K/uL Final  . Monocytes Relative 04/08/2018 10  % Final  . Monocytes Absolute 04/08/2018 0.8  0.1 - 1.0 K/uL Final  . Eosinophils Relative 04/08/2018 2  % Final  . Eosinophils Absolute 04/08/2018 0.2  0.0 - 0.5 K/uL Final  . Basophils Relative 04/08/2018 1  % Final  . Basophils Absolute 04/08/2018 0.1  0.0 - 0.1 K/uL Final  . WBC Morphology 04/08/2018 DIFF CONFIRMED BY MANUAL   Final  . Immature Granulocytes 04/08/2018 0  % Final  . Abs Immature Granulocytes 04/08/2018 0.02  0.00 - 0.07 K/uL Final   Performed at [facility], [address] [place], [place] [address]    Assessment:  Esmay Amspacher is a 37 y.o. female with protein C deficiency and a left lower extremity DVT following a 2 1/2 hour flight.  She had been on DepoProvera x 1 year.  She has a family history of thrombosis.  Left lower extremity duplex on 09/17/2016 revealed an acute appearing thrombus in the calf veins of the left lower extremity (posterior tibial and peroneal veins).  Bilateral lower extremity duplex on 11/20/2016 revealed no evidence of acute DVT within either lower extremity. There was some residual  nonocclusive thrombus in the left peroneal vein, sequela of previous  DVT on the prior scan.  Left lower extremity duplex on 07/05/2017 revealed an isolated thrombus of the left peroneal vein in the calf similar to 11/20/2016.  There was no acute thrombus.  She is on Eliquis.    Hypercoagulable work-up on 09/26/2016 and 11/30/2016 revealed the following negative studies:  Factor V Leiden, prothrombin gene mutation, lupus anticoagulant panel, anticardiolipin antibodies, beta2-glycoprotein, protein S antigen/activity, and ATIII antigen/activity.  She has protein C deficiency.  Labs on 11/30/2016 revealed a protein C total of 40% (60 - 150%) and protein C activity of 49% (73-180%).  Repeat testing on 03/29/2017 revealed a protein C total of 51% and a protein C activity of 51%.  She has a monoclonal gammopathy of unknown significance.  SPEP on 09/26/2016 was negative.  Immunofixation revealed an IgA monoclonal protein with lambda light chain specificity.  Free light chain assay was normal.  IgG was 698 ([phone]).  IgA was 182 (87-352).  24 hour UPEP on 10/22/2016 revealed no monoclonal protein.  SPEP on 03/29/2017 and 10/05/2017 was negative.  Free light chain ratio was normal on 09/26/2016 and 10/05/2017.  Chest, abdomen, and pelvic CT on 12/08/2016 revealed no lymphadenopathy or hepatosplenomegaly.  She underwent total laparoscopic hysterectomy with bilateral salpingectomy on 12/19/2016. She was off Eliquis x 3 days (2 days pre-op and day of surgery).  She developed a small superficial vein thrombus.  She underwent surgery (ACL reconstruction with autograft)  on 05/25/2017.  Left lower extremity duplex on 07/05/2017 revealed an isolated thrombus of the left peroneal vein in the calf.  As compared to 11/20/2016, the thrombus within the left peroneal vein appeared similar.  There was no acute thrombus.  Symptomatically, she is doing well.  She denies any bruising or bleeding.  Exam is normal.  Plan: 1.   Labs today:  CBC with diff, CMP. 2.  Protein C deficiency and left lower extremity DVT:  Continue Eliquis 3.  Monoclonal gammopathy of unknown significance:  Check myeloma panel in 09/2018. 4.  RTC in 3 months for labs (CBC with diff, CMP). 5.  RTC in 6 months for MD assessment and labs (CBC with diff, CMP).    Honor Loh, NP 04/08/2018, 11:30 AM   I saw and evaluated the patient, participating in the key portions of the service and reviewing pertinent diagnostic studies and records.  I reviewed the nurse practitioner's note and agree with the findings and the plan.  The assessment and plan were discussed with the patient.  A few questions were asked by the patient and answered.   Nolon Stalls, MD 04/08/2018, 11:30 AM

## 2018-04-18 ENCOUNTER — Ambulatory Visit: Payer: Self-pay

## 2018-04-18 ENCOUNTER — Emergency Department: Payer: 59

## 2018-04-18 ENCOUNTER — Emergency Department
Admission: EM | Admit: 2018-04-18 | Discharge: 2018-04-18 | Disposition: A | Discharge status: Home / Self Care | Payer: 59 | Attending: Student in an Organized Health Care Education/Training Program | Admitting: Student in an Organized Health Care Education/Training Program

## 2018-04-18 ENCOUNTER — Other Ambulatory Visit: Payer: Self-pay

## 2018-04-18 ENCOUNTER — Encounter: Payer: Self-pay | Admitting: Emergency Medicine

## 2018-04-18 DIAGNOSIS — R531 Weakness: Secondary | ICD-10-CM | POA: Insufficient documentation

## 2018-04-18 DIAGNOSIS — R519 Headache, unspecified: Secondary | ICD-10-CM

## 2018-04-18 DIAGNOSIS — R202 Paresthesia of skin: Secondary | ICD-10-CM | POA: Diagnosis not present

## 2018-04-18 DIAGNOSIS — G43909 Migraine, unspecified, not intractable, without status migrainosus: Secondary | ICD-10-CM | POA: Diagnosis not present

## 2018-04-18 DIAGNOSIS — Z7901 Long term (current) use of anticoagulants: Secondary | ICD-10-CM | POA: Diagnosis not present

## 2018-04-18 DIAGNOSIS — R4781 Slurred speech: Secondary | ICD-10-CM | POA: Insufficient documentation

## 2018-04-18 DIAGNOSIS — Z7984 Long term (current) use of oral hypoglycemic drugs: Secondary | ICD-10-CM | POA: Diagnosis not present

## 2018-04-18 DIAGNOSIS — Z87891 Personal history of nicotine dependence: Secondary | ICD-10-CM | POA: Insufficient documentation

## 2018-04-18 DIAGNOSIS — R200 Anesthesia of skin: Secondary | ICD-10-CM | POA: Diagnosis not present

## 2018-04-18 DIAGNOSIS — R51 Headache: Secondary | ICD-10-CM | POA: Insufficient documentation

## 2018-04-18 DIAGNOSIS — Z79899 Other long term (current) drug therapy: Secondary | ICD-10-CM | POA: Diagnosis not present

## 2018-04-18 LAB — BASIC METABOLIC PANEL
Anion gap: 12 (ref 5–15)
BUN: 8 mg/dL (ref 6–20)
CO2: 22 mmol/L (ref 22–32)
Calcium: 9.3 mg/dL (ref 8.9–10.3)
Chloride: 102 mmol/L (ref 98–111)
Creatinine, Ser: 0.53 mg/dL (ref 0.44–1.00)
GFR calc Af Amer: >60 mL/min (ref >60)
GFR calc non Af Amer: >60 mL/min (ref >60)
Glucose, Bld: 101 mg/dL — ABNORMAL HIGH (ref 70–99)
Potassium: 4.4 mmol/L (ref 3.5–5.1)
Sodium: 136 mmol/L (ref 135–145)

## 2018-04-18 LAB — CBC
HCT: 41.1 % (ref 36.0–46.0)
Hemoglobin: 13.3 g/dL (ref 12.0–15.0)
MCH: 30.2 pg (ref 26.0–34.0)
MCHC: 32.4 g/dL (ref 30.0–36.0)
MCV: 93.2 fL (ref 80.0–100.0)
Platelets: 248 10*3/uL (ref 150–400)
RBC: 4.41 MIL/uL (ref 3.87–5.11)
RDW: 12.6 % (ref 11.5–15.5)
WBC: 9.7 10*3/uL (ref 4.0–10.5)
nRBC: 0 % (ref 0.0–0.2)

## 2018-04-18 MED ORDER — ACETAMINOPHEN 500 MG PO TABS
1000.0000 mg | ORAL_TABLET | Freq: Once | ORAL | Status: AC
  Administered 2018-04-18: 1000 mg via ORAL
  Filled 2018-04-18: qty 2

## 2018-04-18 MED ORDER — PROCHLORPERAZINE EDISYLATE 10 MG/2ML IJ SOLN
10.0000 mg | Freq: Once | INTRAMUSCULAR | Status: AC
  Administered 2018-04-18: 10 mg via INTRAVENOUS
  Filled 2018-04-18: qty 2

## 2018-04-18 MED ORDER — MAGNESIUM SULFATE 2 GM/50ML IV SOLN
2.0000 g | Freq: Once | INTRAVENOUS | Status: AC
  Administered 2018-04-18: 2 g via INTRAVENOUS
  Filled 2018-04-18: qty 50

## 2018-04-18 MED ORDER — DIPHENHYDRAMINE HCL 50 MG/ML IJ SOLN
12.5000 mg | Freq: Once | INTRAMUSCULAR | Status: AC
  Administered 2018-04-18: 12.5 mg via INTRAVENOUS
  Filled 2018-04-18: qty 1

## 2018-04-18 MED ORDER — DEXAMETHASONE SODIUM PHOSPHATE 10 MG/ML IJ SOLN
10.0000 mg | Freq: Once | INTRAMUSCULAR | Status: AC
  Administered 2018-04-18: 10 mg via INTRAVENOUS
  Filled 2018-04-18: qty 1

## 2018-04-18 MED ORDER — KETOROLAC TROMETHAMINE 30 MG/ML IJ SOLN
15.0000 mg | Freq: Once | INTRAMUSCULAR | Status: AC
  Administered 2018-04-18: 15 mg via INTRAVENOUS
  Filled 2018-04-18: qty 1

## 2018-04-18 NOTE — Telephone Encounter (Signed)
Please review

## 2018-04-18 NOTE — ED Notes (Signed)
Spoke with pt about wait times and what to expect next. Advised pt that I am available for further questions if needed.  

## 2018-04-18 NOTE — ED Provider Notes (Signed)
[facility] Emergency Department Provider Note    First MD Initiated Contact with Patient 04/18/18 1354     (approximate)  I have reviewed the triage vital signs and the nursing notes.   HISTORY  Chief Complaint Numbness; Headache; and Aphasia    HPI Tammy Garza is a 37 y.o. female with a history of migraine headaches presents to the ER for evaluation of left-sided headache associated bilateral tingling in her fingers and some slurred speech that occurred around 3:00 today.  Feels that she has some generalized weakness as well.  Has never had a headache with these types of symptoms before but has had tingling as well as oral previously.  Denies any fevers.  Denies feeling that she is having slurred speech or tingling at this moment.  She tried her gabapentin  at home without any improvement.  She is on Eliquis for history of DVT.  Has been compliant with this.   Past Medical History:  Diagnosis Date  . Anemia    h/o  . Cervical high risk human papillomavirus (HPV) DNA test positive   . Chronic gastritis   . Depression   . DVT (deep venous thrombosis) ([place]) 09/2016   left leg  . GERD (gastroesophageal reflux disease)   . H. pylori infection   . History of attempted suicide   . History of gastric polyp   . History of kidney stones    h/o  . Migraines   . Protein C deficiency ([place])   . Rash   . Shift work sleep disorder    Family History  Problem Relation Age of Onset  . HIV Sister   . Cancer Sister 25       cervical cancer  . Asthma Daughter   . Protein C deficiency Daughter   . Hypertension Maternal Grandmother   . Hypertension Mother   . Obesity Mother   . Cancer Father 59       lung cancer  . Deep vein thrombosis Father   . Protein C deficiency Father   . COPD Father   . Diabetes Brother   . COPD Paternal Grandmother   . Deep vein thrombosis Paternal Grandmother   . Protein C deficiency Daughter    Past Surgical History:    Procedure Laterality Date  . ABDOMINAL HYSTERECTOMY    . ANTERIOR CRUCIATE LIGAMENT REPAIR Left   . CHOLECYSTECTOMY    . CYSTOSCOPY  12/19/2016   Procedure: CYSTOSCOPY;  Surgeon: Gae Dry, MD;  Location: [facility] ORS;  Service: Gynecology;;  . ESOPHAGOGASTRODUODENOSCOPY (EGD) WITH PROPOFOL N/A 09/25/2017   Procedure: ESOPHAGOGASTRODUODENOSCOPY (EGD) WITH PROPOFOL;  Surgeon: Jonathon Bellows, MD;  Location[facility] ENDOSCOPY;  Service: Gastroenterology;  Laterality: N/A;  . LAPAROSCOPIC HYSTERECTOMY Bilateral 12/19/2016   Procedure: HYSTERECTOMY TOTAL LAPAROSCOPIC BILATERAL SALPINGECTOMY;  Surgeon: Gae Dry, MD;  Location: [facility] ORS;  Service: Gynecology;  Laterality: Bilateral;  . TUBAL LIGATION     Patient Active Problem List   Diagnosis Date Noted  . Internal hemorrhoid 01/22/2018  . Protein C deficiency ([place]) 12/10/2016  . Monoclonal gammopathy of unknown significance (MGUS) 11/30/2016  . History of DVT (deep vein thrombosis) 11/02/2016  . Elevated blood protein 09/26/2016  . Chronic deep vein thrombosis (DVT) of distal vein of left lower extremity ([place]) 09/17/2016  . Vitamin D deficiency 07/26/2015  . Thyroid cyst 07/26/2015  . History of Helicobacter pylori infection 04/22/2015  . Migraine with aura and without status migrainosus 04/22/2015  . Shift work sleep disorder  04/22/2015      Prior to Admission medications   Medication Sig Start Date End Date Taking? Authorizing Provider  apixaban (ELIQUIS) 5 MG TABS tablet Take 1 tablet (5 mg total) by mouth 2 (two) times daily. 03/05/18   Karen Kitchens, NP  gabapentin (NEURONTIN) 600 MG tablet Take 600 mg by mouth daily. 11/09/17   [provider]  glycopyrrolate (ROBINUL) 1 MG tablet Take 3 mg by mouth daily. 01/29/17   Dasher, Rayvon Char, MD  Insulin Pen Needle 32G X 6 MM MISC 1 each by Does not apply route 2 (two) times daily. 02/20/18   [name], MD  Liraglutide -Weight Management (SAXENDA) 18 MG/3ML SOPN Inject 3 mg  into the skin every morning. 02/22/18   [name], MD    Allergies Topamax [topiramate]    Social History Social History   Tobacco Use  . Smoking status: Former Smoker    Packs/day: 0.25    Years: 5.00    Pack years: 1.25    Types: Cigarettes    Start date: 05/15/2002    Last attempt to quit: 05/16/2007    Years since quitting: 10.9  . Smokeless tobacco: Never Used  Substance Use Topics  . Alcohol use: Yes    Alcohol/week: 1.0 standard drinks    Types: 1 Glasses of wine per week    Comment: occ wine none last 24 hrs  . Drug use: No    Review of Systems Patient denies headaches, rhinorrhea, blurry vision, numbness, shortness of breath, chest pain, edema, cough, abdominal pain, nausea, vomiting, diarrhea, dysuria, fevers, rashes or hallucinations unless otherwise stated above in HPI. ____________________________________________   PHYSICAL EXAM:  VITAL SIGNS: Vitals:   04/18/18 1541 04/18/18 1630  BP:  128/83  Pulse: 72 85  Resp:    Temp:    SpO2: 97% 98%    Constitutional: Alert and oriented. Non toxic appearing Eyes: Conjunctivae are normal.  Head: Atraumatic. Nose: No congestion/rhinnorhea. Mouth/Throat: Mucous membranes are moist.   Neck: No stridor. Painless ROM.  Cardiovascular: Normal rate, regular rhythm. Grossly normal heart sounds.  Good peripheral circulation. Respiratory: Normal respiratory effort.  No retractions. Lungs CTAB. Gastrointestinal: Soft and nontender. No distention. No abdominal bruits. No CVA tenderness. Genitourinary:  Musculoskeletal: No lower extremity tenderness nor edema.  No joint effusions. Neurologic:  CN- intact.  No facial droop, Normal FNF.  Normal heel to shin.  Sensation intact bilaterally. Normal speech and language. No gross focal neurologic deficits are appreciated. No gait instability.  Skin:  Skin is warm, dry and intact. No rash noted. Psychiatric: Mood and affect are normal. Speech and behavior are  normal.  ____________________________________________   LABS (all labs ordered are listed, but only abnormal results are displayed)  Results for orders placed or performed during the hospital encounter of 04/18/18 (from the past 24 hour(s))  Basic metabolic panel     Status: Abnormal   Collection Time: 04/18/18 11:32 AM  Result Value Ref Range   Sodium 136 135 - 145 mmol/L   Potassium 4.4 3.5 - 5.1 mmol/L   Chloride 102 98 - 111 mmol/L   CO2 22 22 - 32 mmol/L   Glucose, Bld 101 (H) 70 - 99 mg/dL   BUN 8 6 - 20 mg/dL   Creatinine, Ser 0.53 0.44 - 1.00 mg/dL   Calcium 9.3 8.9 - 10.3 mg/dL   GFR calc non Af Amer >60 >60 mL/min   GFR calc Af Amer >60 >60 mL/min   Anion gap 12  5 - 15  CBC     Status: None   Collection Time: 04/18/18 11:32 AM  Result Value Ref Range   WBC 9.7 4.0 - 10.5 K/uL   RBC 4.41 3.87 - 5.11 MIL/uL   Hemoglobin 13.3 12.0 - 15.0 g/dL   HCT 41.1 36.0 - 46.0 %   MCV 93.2 80.0 - 100.0 fL   MCH 30.2 26.0 - 34.0 pg   MCHC 32.4 30.0 - 36.0 g/dL   RDW 12.6 11.5 - 15.5 %   Platelets 248 150 - 400 K/uL   nRBC 0.0 0.0 - 0.2 %   ____________________________________________  E ____________________________________________  RADIOLOGY  I personally reviewed all radiographic images ordered to evaluate for the above acute complaints and reviewed radiology reports and findings.  These findings were personally discussed with the patient.  Please see medical record for radiology report.  ____________________________________________   PROCEDURES  Procedure(s) performed:  Procedures    Critical Care performed: no ____________________________________________   INITIAL IMPRESSION / ASSESSMENT AND PLAN / ED COURSE  Pertinent labs & imaging results that were available during my care of the patient were reviewed by me and considered in my medical decision making (see chart for details).   DDX: Migraine, tension, cluster, sinusitis, bleed, mass, vasospasm  Tammy Garza is a 37 y.o. who presents to the ED with symptoms as described above.  Patient nontoxic-appearing but is uncomfortable.  No focal neuro deficits at this time.  Possibly complex migraine.  CT head shows no evidence of bleed and have low suspicion for subarachnoid based on her clinical presentation.  Doubt meningitis or encephalitis.  Patient is on Eliquis and has been compliant with this of doubt CVT.  Will provide IV fluids as well as IV migraine cocktail and reassess.  Clinical Course as of Apr 19 1835  Thu Apr 18, 2018  1831 MRI brain is reassuring.  No evidence of stroke or mass.  Likely complex migraine.  Headache improved.  At this point do believe she stable and appropriate for outpatient follow-up.   [PR]    Clinical Course User Index [PR] Merlyn Lot, MD     As part of my medical decision making, I reviewed the following data within the [facility] notes reviewed and incorporated, Labs reviewed, notes from prior ED visits and [place] Controlled Substance Database   ____________________________________________   FINAL CLINICAL IMPRESSION(S) / ED DIAGNOSES  Final diagnoses:  Bad headache      NEW MEDICATIONS STARTED DURING THIS VISIT:  New Prescriptions   No medications on file     Note:  This document was prepared using Dragon voice recognition software and may include unintentional dictation errors.    Merlyn Lot, MD 04/18/18 [phone]

## 2018-04-18 NOTE — ED Notes (Signed)
Patient transported to MRI 

## 2018-04-18 NOTE — ED Triage Notes (Signed)
Says tingling left fingers, left side headache and slurred speech since 330am.  Arms no drift, face no droop.  Also weakness.

## 2018-04-18 NOTE — Discharge Instructions (Addendum)

## 2018-04-18 NOTE — Telephone Encounter (Signed)
Pt called stating that she had a sudden onset headache at work last night.  She states that she has migraines but never like this. Her head hurts on the right side only. She describes a slurring of her speech at work around [employer] am. She was sent to the nurse and her VS were fine per pt BP was 120/70.  She started to vomit and was sent home. Pt states she goes to the headache clinic in [place]. She has taken her rescue medication without effect. Per protocol pt will be seen in the ED today. Care advice read to patient. Pt verbalized understanding. She will call her father to transport her. Pt refused Triage help with the call.  Reason for Disposition . [1] SEVERE headache (e.g., excruciating) AND [2] "worst headache" of life  Answer Assessment - Initial Assessment Questions 1. LOCATION: "Where does it hurt?"      Rt side of head 2. ONSET: "When did the headache start?" (Minutes, hours or days)      0330 today 3. PATTERN: "Does the pain come and go, or has it been constant since it started?"     Constant  4. SEVERITY: "How bad is the pain?" and "What does it keep you from doing?"  (e.g., Scale 1-10; mild, moderate, or severe)   - MILD (1-3): doesn't interfere with normal activities    - MODERATE (4-7): interferes with normal activities or awakens from sleep    - SEVERE (8-10): excruciating pain, unable to do any normal activities        severe 5. RECURRENT SYMPTOM: "Have you ever had headaches before?" If so, ask: "When was the last time?" and "What happened that time?"      migrains 6. CAUSE: "What do you think is causing the headache?"     Not sure 7. MIGRAINE: "Have you been diagnosed with migraine headaches?" If so, ask: "Is this headache similar?"      Yes but this is different with nausea slurring of speech last night at work 8. HEAD INJURY: "Has there been any recent injury to the head?"      no 9. OTHER SYMPTOMS: "Do you have any other symptoms?" (fever, stiff neck, eye pain, sore  throat, cold symptoms)     no 10. PREGNANCY: "Is there any chance you are pregnant?" "When was your last menstrual period?"       No partial histerectomy  Protocols used: HEADACHE-A-AH

## 2018-04-19 IMAGING — CR DG ABDOMEN 1V
1 series · 2 of 2 positions shown · non-contrast
Comparison: CT of the abdomen and pelvis performed 12/08/2016

CLINICAL DATA: Acute onset of generalized abdominal pain, status
post recent partial hysterectomy. Initial encounter.

EXAM:
ABDOMEN - 1 VIEW

[Series 1: dg abd 1 view · 0.14mm/px · 2 of 2 slices shown]
[im 1/2]
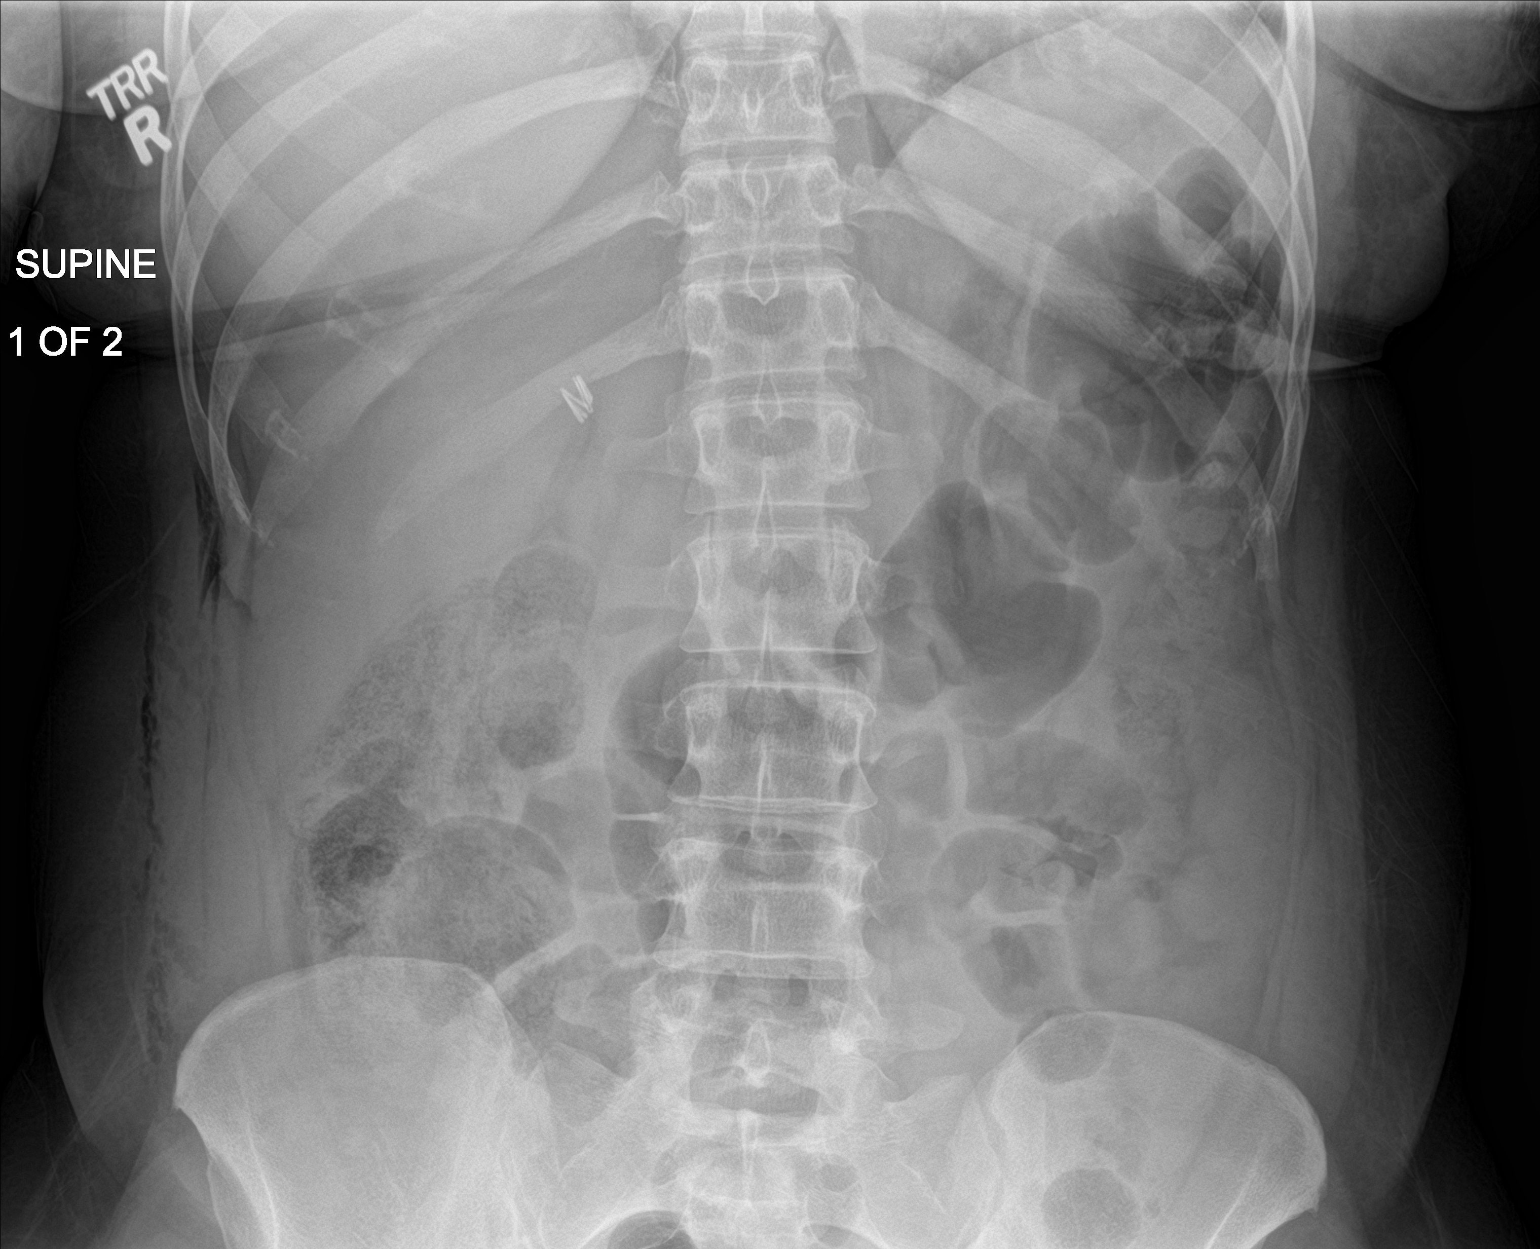
[im 2/2]
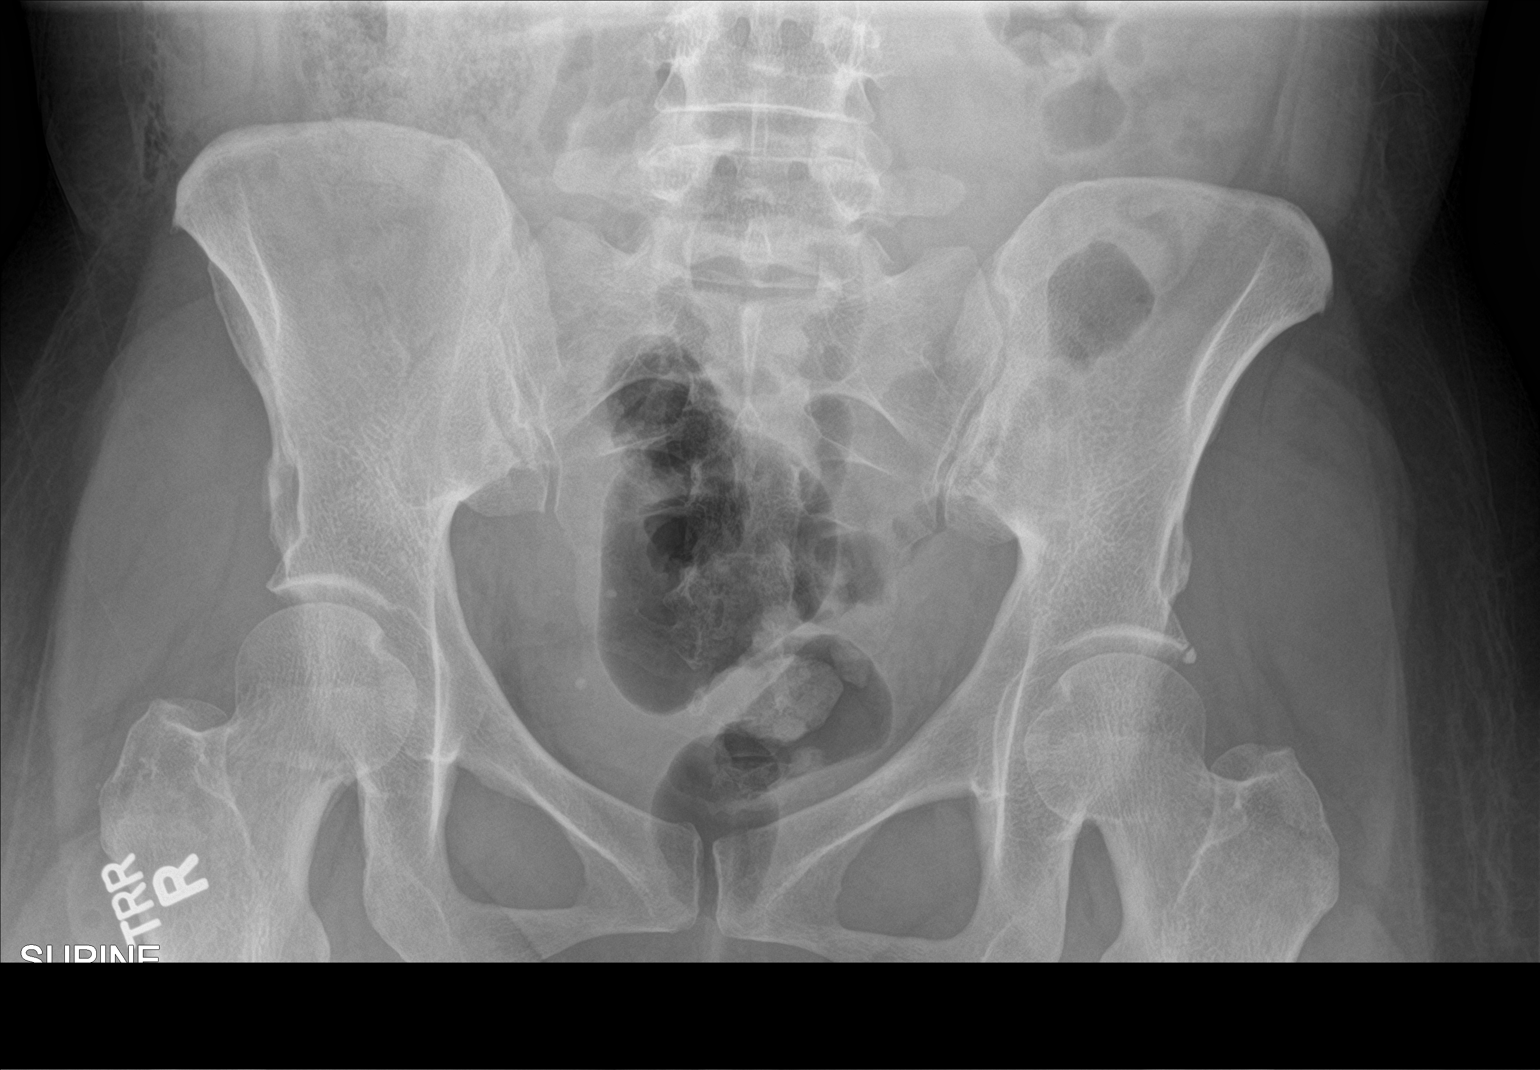

[2 of 2 positions shown; findings below may reference images not displayed]

FINDINGS: The visualized bowel gas pattern is unremarkable. Scattered air and
stool filled loops of colon are seen; no abnormal dilatation of
small bowel loops is seen to suggest small bowel obstruction. No
free intra-abdominal air is identified, though evaluation for free
air is limited on a single supine view. Clips are noted within the
right upper quadrant, reflecting prior cholecystectomy.

The visualized osseous structures are within normal limits; the
sacroiliac joints are unremarkable in appearance.
IMPRESSION: Unremarkable bowel gas pattern; no free intra-abdominal air seen.
Small to moderate amount of stool noted in the colon.

## 2018-04-23 DIAGNOSIS — G43019 Migraine without aura, intractable, without status migrainosus: Secondary | ICD-10-CM | POA: Diagnosis not present

## 2018-04-23 DIAGNOSIS — G43719 Chronic migraine without aura, intractable, without status migrainosus: Secondary | ICD-10-CM | POA: Diagnosis not present

## 2018-04-29 DIAGNOSIS — L74519 Primary focal hyperhidrosis, unspecified: Secondary | ICD-10-CM | POA: Diagnosis not present

## 2018-04-29 DIAGNOSIS — L74511 Primary focal hyperhidrosis, face: Secondary | ICD-10-CM | POA: Diagnosis not present

## 2018-04-29 DIAGNOSIS — L700 Acne vulgaris: Secondary | ICD-10-CM | POA: Diagnosis not present

## 2018-05-06 ENCOUNTER — Other Ambulatory Visit: Payer: Self-pay | Admitting: Hematology and Oncology

## 2018-05-29 ENCOUNTER — Ambulatory Visit: Payer: 59 | Admitting: Family Medicine

## 2018-05-29 ENCOUNTER — Encounter: Payer: Self-pay | Admitting: Family Medicine

## 2018-05-29 VITALS — BP 110/70 | HR 85 | Temp 98.2°F | Resp 16 | Ht 66.0 in | Wt 180.1 lb

## 2018-05-29 DIAGNOSIS — D6859 Other primary thrombophilia: Secondary | ICD-10-CM

## 2018-05-29 DIAGNOSIS — G43109 Migraine with aura, not intractable, without status migrainosus: Secondary | ICD-10-CM

## 2018-05-29 DIAGNOSIS — E663 Overweight: Secondary | ICD-10-CM

## 2018-05-29 DIAGNOSIS — Z9889 Other specified postprocedural states: Secondary | ICD-10-CM

## 2018-05-29 DIAGNOSIS — R61 Generalized hyperhidrosis: Secondary | ICD-10-CM | POA: Diagnosis not present

## 2018-05-29 DIAGNOSIS — I825Z2 Chronic embolism and thrombosis of unspecified deep veins of left distal lower extremity: Secondary | ICD-10-CM

## 2018-05-29 DIAGNOSIS — Z86718 Personal history of other venous thrombosis and embolism: Secondary | ICD-10-CM | POA: Diagnosis not present

## 2018-05-29 DIAGNOSIS — M25562 Pain in left knee: Secondary | ICD-10-CM

## 2018-05-29 NOTE — Progress Notes (Signed)
Name: Tammy Garza   MRN: [id number]    DOB: 1981/02/21   Date:05/29/2018       Progress Note  Subjective  Chief Complaint  Chief Complaint  Patient presents with  . Migraine  . Weight Gain  . Deep vein thrombosis  . Joint Swelling    left knee swelling from standing alot working 12 hours.    HPI  Protein C deficiency: sees Dr. Mike Gip and is on long term Eliquis since diagnosed with first DVT back in May 2018. No recent episodes of DVT or calf pain , but has chronic left DVT with intermittent lower left leg edema  Left knee pain and intermittent edema: had ligament reconstruction surgery of the left knee last year, and when she works long hours left knee swells , she would like to continue working overtime but feels that if she parked closer it would help with symptoms, she asked for handicap sticker but discussed accommodations at work instead  Migraine headaches: she is taking gabapentin for the past year, sees Dr. Domingo Cocking , episodes down to 3-4 times per month, had one severe episode about one month ago that made her go to [facility] afraid she had a stroke, studies negative but since than dose of gabapentin was increased by Dr. Domingo Cocking.  She has associated nausea, photophobia and phonophobia. Pain is described as temporal throbbing like.   Thyroid cyst: [place] was reassuring no need for biopsy or repeat study  Patient Active Problem List   Diagnosis Date Noted  . Internal hemorrhoid 01/22/2018  . Protein C deficiency ([place]) 12/10/2016  . Monoclonal gammopathy of unknown significance (MGUS) 11/30/2016  . History of DVT (deep vein thrombosis) 11/02/2016  . Elevated blood protein 09/26/2016  . Chronic deep vein thrombosis (DVT) of distal vein of left lower extremity ([place]) 09/17/2016  . Vitamin D deficiency 07/26/2015  . Thyroid cyst 07/26/2015  . History of Helicobacter pylori infection 04/22/2015  . Migraine with aura and without status migrainosus 04/22/2015  . Shift work sleep  disorder 04/22/2015    Past Surgical History:  Procedure Laterality Date  . ABDOMINAL HYSTERECTOMY    . ANTERIOR CRUCIATE LIGAMENT REPAIR Left   . CHOLECYSTECTOMY    . CYSTOSCOPY  12/19/2016   Procedure: CYSTOSCOPY;  Surgeon: Gae Dry, MD;  Location: [facility] ORS;  Service: Gynecology;;  . ESOPHAGOGASTRODUODENOSCOPY (EGD) WITH PROPOFOL N/A 09/25/2017   Procedure: ESOPHAGOGASTRODUODENOSCOPY (EGD) WITH PROPOFOL;  Surgeon: Jonathon Bellows, MD;  Location: [facility] ENDOSCOPY;  Service: Gastroenterology;  Laterality: N/A;  . LAPAROSCOPIC HYSTERECTOMY Bilateral 12/19/2016   Procedure: HYSTERECTOMY TOTAL LAPAROSCOPIC BILATERAL SALPINGECTOMY;  Surgeon: Gae Dry, MD;  Location: [facility] ORS;  Service: Gynecology;  Laterality: Bilateral;  . TUBAL LIGATION      Family History  Problem Relation Age of Onset  . HIV Sister   . Cancer Sister 7       cervical cancer  . Asthma Daughter   . Protein C deficiency Daughter   . Hypertension Maternal Grandmother   . Hypertension Mother   . Obesity Mother   . Cancer Father 38       lung cancer  . Deep vein thrombosis Father   . Protein C deficiency Father   . COPD Father   . Diabetes Brother   . COPD Paternal Grandmother   . Deep vein thrombosis Paternal Grandmother   . Protein C deficiency Daughter     Social History   Socioeconomic History  . Marital status: Divorced    Spouse name: Not  on file  . Number of children: 2  . Years of education: 2  . Highest education level: Associate degree: occupational, Hotel manager, or vocational program  Occupational History  . Not on file  Social Needs  . Financial resource strain: Somewhat hard  . Food insecurity:    Worry: Sometimes true    Inability: Sometimes true  . Transportation needs:    Medical: No    Non-medical: No  Tobacco Use  . Smoking status: Former Smoker    Packs/day: 0.25    Years: 5.00    Pack years: 1.25    Types: Cigarettes    Start date: 05/15/2002    Last attempt to quit:  05/16/2007    Years since quitting: 11.0  . Smokeless tobacco: Never Used  Substance and Sexual Activity  . Alcohol use: Yes    Alcohol/week: 1.0 standard drinks    Types: 1 Glasses of wine per week    Comment: occ wine none last 24 hrs  . Drug use: No  . Sexual activity: Yes    Partners: Male    Birth control/protection: Surgical    Comment: hysterectomy   Lifestyle  . Physical activity:    Days per week: 3 days    Minutes per session: 60 min  . Stress: Not at all  Relationships  . Social connections:    Talks on phone: More than three times a week    Gets together: Once a week    Attends religious service: More than 4 times per year    Active member of club or organization: Yes    Attends meetings of clubs or organizations: 1 to 4 times per year    Relationship status: Divorced  . Intimate partner violence:    Fear of current or ex partner: No    Emotionally abused: No    Physically abused: No    Forced sexual activity: No  Other Topics Concern  . Not on file  Social History Narrative   Separated since June 2016 - she has two children at home . The youngest is his daughter. Working at [employer] in [place].    Dating Gwyndolyn Saxon since 2015     Current Outpatient Medications:  .  ELIQUIS 5 MG TABS tablet, TAKE 1 TABLET BY MOUTH TWICE A DAY, Disp: 60 tablet, Rfl: 0 .  gabapentin (NEURONTIN) 600 MG tablet, Take 600 mg by mouth daily., Disp: , Rfl: 5 .  glycopyrrolate (ROBINUL) 1 MG tablet, Take 3 mg by mouth daily., Disp: , Rfl: 2 .  clindamycin (CLEOCIN T) 1 % lotion, Apply 1 g topically daily. On face, Disp: , Rfl:  .  DRYSOL 20 % external solution, Apply 1 g topically daily., Disp: , Rfl:  .  tretinoin (RETIN-A) 0.025 % cream, Apply 1 g topically at bedtime., Disp: , Rfl:   Allergies  Allergen Reactions  . Topamax [Topiramate]     Confusion , slurred speech     I personally reviewed active problem list, medication list, allergies, family history, social history  with the patient/caregiver today.   ROS  Constitutional: Negative for fever , positive for weight change.  Respiratory: Negative for cough and shortness of breath.   Cardiovascular: Negative for chest pain or palpitations.  Gastrointestinal: Negative for abdominal pain, no bowel changes.  Musculoskeletal: positive  for gait problem and intermittent  joint swelling.  Skin: Negative for rash.  Neurological: Negative for dizziness or headache.  No other specific complaints in a complete review of systems (  except as listed in HPI above).  Objective  Vitals:   05/29/18 1406  BP: 110/70  Pulse: 85  Resp: 16  Temp: 98.2 F (36.8 C)  TempSrc: Oral  SpO2: 100%  Weight: 180 lb 1.6 oz (81.7 kg)  Height: _0  (1.676 m)    Body mass index is 29.07 kg/m.  Physical Exam  Constitutional: Patient appears well-developed and well-nourished. Obese No distress.  HEENT: head atraumatic, normocephalic, pupils equal and reactive to light,  neck supple, throat within normal limits Cardiovascular: Normal rate, regular rhythm and normal heart sounds.  No murmur heard. No BLE edema. Pulmonary/Chest: Effort normal and breath sounds normal. No respiratory distress. Abdominal: Soft.  There is no tenderness. Muscular Skeletal: normal rom of knee  Psychiatric: Patient has a normal mood and affect. behavior is normal. Judgment and thought content normal.  Recent Results (from the past 2160 hour(s))  Comprehensive metabolic panel     Status: Abnormal   Collection Time: 04/08/18 10:32 AM  Result Value Ref Range   Sodium 137 135 - 145 mmol/L   Potassium 4.3 3.5 - 5.1 mmol/L   Chloride 104 98 - 111 mmol/L   CO2 26 22 - 32 mmol/L   Glucose, Bld 76 70 - 99 mg/dL   BUN 8 6 - 20 mg/dL   Creatinine, Ser 0.68 0.44 - 1.00 mg/dL   Calcium 8.8 (L) 8.9 - 10.3 mg/dL   Total Protein 7.3 6.5 - 8.1 g/dL   Albumin 4.4 3.5 - 5.0 g/dL   AST 16 15 - 41 U/L   ALT 14 0 - 44 U/L   Alkaline Phosphatase 43 38 - 126  U/L   Total Bilirubin 0.9 0.3 - 1.2 mg/dL   GFR calc non Af Amer >60 >60 mL/min   GFR calc Af Amer >60 >60 mL/min    Comment: (NOTE) The eGFR has been calculated using the CKD EPI equation. This calculation has not been validated in all clinical situations. eGFR's persistently <60 mL/min signify possible Chronic Kidney Disease.    Anion gap 7 5 - 15    Comment: Performed at [facility], [address] [place], [place] [address]  CBC with Differential/Platelet     Status: None   Collection Time: 04/08/18 10:32 AM  Result Value Ref Range   WBC 7.7 4.0 - 10.5 K/uL   RBC 4.30 3.87 - 5.11 MIL/uL   Hemoglobin 12.8 12.0 - 15.0 g/dL   HCT 40.2 36.0 - 46.0 %   MCV 93.5 80.0 - 100.0 fL   MCH 29.8 26.0 - 34.0 pg   MCHC 31.8 30.0 - 36.0 g/dL   RDW 11.8 11.5 - 15.5 %   Platelets 273 150 - 400 K/uL   nRBC 0.0 0.0 - 0.2 %   Neutrophils Relative % 44 %   Neutro Abs 3.4 1.7 - 7.7 K/uL   Lymphocytes Relative 43 %   Lymphs Abs 3.3 0.7 - 4.0 K/uL   Monocytes Relative 10 %   Monocytes Absolute 0.8 0.1 - 1.0 K/uL   Eosinophils Relative 2 %   Eosinophils Absolute 0.2 0.0 - 0.5 K/uL   Basophils Relative 1 %   Basophils Absolute 0.1 0.0 - 0.1 K/uL   WBC Morphology DIFF CONFIRMED BY MANUAL    Immature Granulocytes 0 %   Abs Immature Granulocytes 0.02 0.00 - 0.07 K/uL    Comment: Performed at [facility], [address], [place], [place] [address]  Basic metabolic panel     Status:  Abnormal   Collection Time: 04/18/18 11:32 AM  Result Value Ref Range   Sodium 136 135 - 145 mmol/L   Potassium 4.4 3.5 - 5.1 mmol/L   Chloride 102 98 - 111 mmol/L   CO2 22 22 - 32 mmol/L   Glucose, Bld 101 (H) 70 - 99 mg/dL   BUN 8 6 - 20 mg/dL   Creatinine, Ser 0.53 0.44 - 1.00 mg/dL   Calcium 9.3 8.9 - 10.3 mg/dL   GFR calc non Af Amer >60 >60 mL/min   GFR calc Af Amer >60 >60 mL/min   Anion gap 12 5 - 15    Comment: Performed at [facility], [address] [place], [place]  [address]  CBC     Status: None   Collection Time: 04/18/18 11:32 AM  Result Value Ref Range   WBC 9.7 4.0 - 10.5 K/uL   RBC 4.41 3.87 - 5.11 MIL/uL   Hemoglobin 13.3 12.0 - 15.0 g/dL   HCT 41.1 36.0 - 46.0 %   MCV 93.2 80.0 - 100.0 fL   MCH 30.2 26.0 - 34.0 pg   MCHC 32.4 30.0 - 36.0 g/dL   RDW 12.6 11.5 - 15.5 %   Platelets 248 150 - 400 K/uL   nRBC 0.0 0.0 - 0.2 %    Comment: Performed at [facility], [place], [place] [address]      PHQ2/9: Depression screen [facility] 2/9 02/20/2018 08/20/2017 05/17/2017 11/20/2016 08/22/2016  Decreased Interest 0 0 0 0 0  Down, Depressed, Hopeless 1 0 0 0 0  PHQ - 2 Score 1 0 0 0 0  Altered sleeping 0 - - - -  Tired, decreased energy 0 - - - -  Change in appetite 0 - - - -  Feeling bad or failure about yourself  0 - - - -  Trouble concentrating 0 - - - -  Moving slowly or fidgety/restless 0 - - - -  Suicidal thoughts 0 - - - -  PHQ-9 Score 1 - - - -  Difficult doing work/chores Not difficult at all - - - -     Fall Risk: Fall Risk  05/29/2018 02/20/2018 08/20/2017 05/17/2017 11/20/2016  Falls in the past year? 0 No No No No  Number falls in past yr: 0 - - - -  Injury with Fall? 0 - - - -     Assessment & Plan  1. Migraine with aura and without status migrainosus, not intractable  Had one severe episode in Dec, went to [facility] and had negative MRI and CT brain, under the care of Dr. Domingo Cocking and he adjusting Gabapentin dose  2. Hyperhidrosis  Going to [facility], using topical medication and oral and has helped with symptoms   3. Protein C deficiency ([place])  Sees Dr. Mike Gip , still going every 3 months for follow up  4. History of DVT (deep vein thrombosis)  On Eliquis   5. History of left knee surgery   6. Left anterior knee pain  Intermittently usually when working long hours , she asked for handicap form to be filled out, but explained I can send note to work for preferential parking   7. Overweight  (BMI 25.0-29.9)  Doing well now, off Saxenda, weight is down 9 lbs  8. Chronic deep vein thrombosis (DVT) of distal vein of left lower extremity ([facility])  Still sees Dr. Mike Gip and has intermittent swelling left lower leg when she works long hours

## 2018-06-04 ENCOUNTER — Other Ambulatory Visit: Payer: Self-pay | Admitting: Urgent Care

## 2018-06-06 ENCOUNTER — Other Ambulatory Visit (HOSPITAL_COMMUNITY)
Admission: RE | Admit: 2018-06-06 | Discharge: 2018-06-06 | Disposition: A | Discharge status: Home / Self Care | Payer: 59 | Source: Ambulatory Visit | Attending: Nurse Practitioner | Admitting: Nurse Practitioner

## 2018-06-06 ENCOUNTER — Encounter: Payer: Self-pay | Admitting: Nurse Practitioner

## 2018-06-06 ENCOUNTER — Ambulatory Visit: Payer: 59 | Admitting: Nurse Practitioner

## 2018-06-06 ENCOUNTER — Encounter: Payer: Self-pay | Admitting: Family Medicine

## 2018-06-06 VITALS — BP 116/78 | HR 87 | Temp 98.0°F | Resp 16 | Ht 66.0 in | Wt 180.6 lb

## 2018-06-06 DIAGNOSIS — N760 Acute vaginitis: Secondary | ICD-10-CM | POA: Diagnosis not present

## 2018-06-06 DIAGNOSIS — N898 Other specified noninflammatory disorders of vagina: Secondary | ICD-10-CM

## 2018-06-06 MED ORDER — FLUCONAZOLE 150 MG PO TABS: 150.0000 mg | ORAL_TABLET | Freq: Once | ORAL | 0 refills | Status: AC

## 2018-06-06 NOTE — Patient Instructions (Signed)
-   use gentle, unscented soaps for cleaning. Do not wear underwear while you sleep, avoid tight pants. Take diflucan today.  Vaginitis Vaginitis is irritation and swelling (inflammation) of the vagina. It happens when normal bacteria and yeast in the vagina grow too much. There are many types of this condition. Treatment will depend on the type you have. Follow these instructions at home: Lifestyle  Keep your vagina area clean and dry. ? Avoid using soap. ? Rinse the area with water.  Do not do the following until your doctor says it is okay: ? Wash and clean out the vagina (douche). ? Use tampons. ? Have sex.  Wipe from front to back after going to the bathroom.  Let air reach your vagina. ? Wear cotton underwear. ? Do not wear: ? Underwear while you sleep. ? Tight pants. ? Thong underwear. ? Underwear or nylons without a cotton panel. ? Take off any wet clothing, such as bathing suits, as soon as possible.  Use gentle, non-scented products. Do not use things that can irritate the vagina, such as fabric softeners. Avoid the following products if they are scented: ? Feminine sprays. ? Detergents. ? Tampons. ? Feminine hygiene products. ? Soaps or bubble baths.  Practice safe sex and use condoms. General instructions  Take over-the-counter and prescription medicines only as told by your doctor.  If you were prescribed an antibiotic medicine, take or use it as told by your doctor. Do not stop taking or using the antibiotic even if you start to feel better.  Keep all follow-up visits as told by your doctor. This is important. Contact a doctor if:  You have pain in your belly.  You have a fever.  Your symptoms last for more than 2-3 days. Get help right away if:  You have a fever and your symptoms get worse all of a sudden. Summary  Vaginitis is irritation and swelling of the vagina. It can happen when the normal bacteria and yeast in the vagina grow too much. There  are many types.  Treatment will depend on the type you have.  Do not douche, use tampons , or have sex until your health care provider approves. When you can return to sex, practice safe sex and use condoms. This information is not intended to replace advice given to you by your health care provider. Make sure you discuss any questions you have with your health care provider. Document Released: 07/28/2008 Document Revised: 05/23/2016 Document Reviewed: 05/23/2016 Elsevier Interactive Patient Education  2019 [employer].

## 2018-06-06 NOTE — Progress Notes (Signed)
Name: Tammy Garza   MRN: [id number]    DOB: 1980-12-20   Date:06/06/2018       Progress Note  Subjective  Chief Complaint  Chief Complaint  Patient presents with  . Vaginitis    sx started yesterday and has gotten worse    HPI Patient notes vaginal irritation the last 2-3 days and then today noted thick cottage cheese vaginal discharge. Pain when she initially starts to urinate. Has had yeast and BV in the past. No recent antibiotics, does not douche. Sexual partner- monogamous no condoms. Lower abdominal cramping.   Patient Active Problem List   Diagnosis Date Noted  . Internal hemorrhoid 01/22/2018  . Protein C deficiency ([place]) 12/10/2016  . Monoclonal gammopathy of unknown significance (MGUS) 11/30/2016  . History of DVT (deep vein thrombosis) 11/02/2016  . Elevated blood protein 09/26/2016  . Chronic deep vein thrombosis (DVT) of distal vein of left lower extremity ([place]) 09/17/2016  . Vitamin D deficiency 07/26/2015  . Thyroid cyst 07/26/2015  . History of Helicobacter pylori infection 04/22/2015  . Migraine with aura and without status migrainosus 04/22/2015  . Shift work sleep disorder 04/22/2015    Past Medical History:  Diagnosis Date  . Anemia    h/o  . Cervical high risk human papillomavirus (HPV) DNA test positive   . Chronic gastritis   . Depression   . DVT (deep venous thrombosis) ([place]) 09/2016   left leg  . GERD (gastroesophageal reflux disease)   . H. pylori infection   . History of attempted suicide   . History of gastric polyp   . History of kidney stones    h/o  . Migraines   . Protein C deficiency ([place])   . Rash   . Shift work sleep disorder     Past Surgical History:  Procedure Laterality Date  . ABDOMINAL HYSTERECTOMY    . ANTERIOR CRUCIATE LIGAMENT REPAIR Left   . CHOLECYSTECTOMY    . CYSTOSCOPY  12/19/2016   Procedure: CYSTOSCOPY;  Surgeon: Gae Dry, MD;  Location: [facility] ORS;  Service: Gynecology;;  . ESOPHAGOGASTRODUODENOSCOPY  (EGD) WITH PROPOFOL N/A 09/25/2017   Procedure: ESOPHAGOGASTRODUODENOSCOPY (EGD) WITH PROPOFOL;  Surgeon: Jonathon Bellows, MD;  Location[facility] ENDOSCOPY;  Service: Gastroenterology;  Laterality: N/A;  . LAPAROSCOPIC HYSTERECTOMY Bilateral 12/19/2016   Procedure: HYSTERECTOMY TOTAL LAPAROSCOPIC BILATERAL SALPINGECTOMY;  Surgeon: Gae Dry, MD;  Location: [facility] ORS;  Service: Gynecology;  Laterality: Bilateral;  . TUBAL LIGATION      Social History   Tobacco Use  . Smoking status: Former Smoker    Packs/day: 0.25    Years: 5.00    Pack years: 1.25    Types: Cigarettes    Start date: 05/15/2002    Last attempt to quit: 05/16/2007    Years since quitting: 11.0  . Smokeless tobacco: Never Used  Substance Use Topics  . Alcohol use: Yes    Alcohol/week: 1.0 standard drinks    Types: 1 Glasses of wine per week    Comment: occ wine none last 24 hrs     Current Outpatient Medications:  .  clindamycin (CLEOCIN T) 1 % lotion, Apply 1 g topically daily. On face, Disp: , Rfl:  .  DRYSOL 20 % external solution, Apply 1 g topically daily., Disp: , Rfl:  .  ELIQUIS 5 MG TABS tablet, TAKE 1 TABLET BY MOUTH TWICE A DAY, Disp: 60 tablet, Rfl: 0 .  gabapentin (NEURONTIN) 600 MG tablet, Take 600 mg by mouth daily., Disp: ,  Rfl: 5 .  glycopyrrolate (ROBINUL) 1 MG tablet, Take 3 mg by mouth daily., Disp: , Rfl: 2 .  tretinoin (RETIN-A) 0.025 % cream, Apply 1 g topically at bedtime., Disp: , Rfl:   Allergies  Allergen Reactions  . Topamax [Topiramate]     Confusion , slurred speech     ROS   No other specific complaints in a complete review of systems (except as listed in HPI above).  Objective  Vitals:   06/06/18 1318  BP: 116/78  Pulse: 87  Resp: 16  Temp: 98 F (36.7 C)  TempSrc: Oral  SpO2: 99%  Weight: 180 lb 9.6 oz (81.9 kg)  Height: 5\' 6"  (1.676 m)    Body mass index is 29.15 kg/m.  Nursing Note and Vital Signs reviewed.  Physical Exam Exam conducted with a chaperone  present.  Constitutional:      Appearance: Normal appearance.  Cardiovascular:     Rate and Rhythm: Normal rate.  Pulmonary:     Effort: Pulmonary effort is normal.  Abdominal:     General: Abdomen is flat. Bowel sounds are normal.     Tenderness: There is no abdominal tenderness.  Genitourinary:    Exam position: Supine.     Pubic Area: No rash or pubic lice.      Labia:        Right: Tenderness present.        Left: Tenderness present.      Vagina: No foreign body. Vaginal discharge and tenderness present. No bleeding.     Cervix: Discharge present.  Skin:    General: Skin is warm and dry.  Neurological:     General: No focal deficit present.     Mental Status: She is alert and oriented to person, place, and time.  Psychiatric:        Mood and Affect: Mood normal.        Thought Content: Thought content normal.       No results found for this or any previous visit (from the past 48 hour(s)).  Assessment & Plan  1. Acute vaginitis - use gentle, unscented soaps for cleaning. Do not wear underwear while you sleep, avoid tight pants. Take diflucan today.  - fluconazole (DIFLUCAN) 150 MG tablet; Take 1 tablet (150 mg total) by mouth once for 1 dose.  Dispense: 1 tablet; Refill: 0 - Cervicovaginal ancillary only  2. White vaginal discharge - fluconazole (DIFLUCAN) 150 MG tablet; Take 1 tablet (150 mg total) by mouth once for 1 dose.  Dispense: 1 tablet; Refill: 0 - Cervicovaginal ancillary only

## 2018-06-07 ENCOUNTER — Encounter: Payer: Self-pay | Admitting: Obstetrics & Gynecology

## 2018-06-07 LAB — CERVICOVAGINAL ANCILLARY ONLY
Bacterial vaginitis: POSITIVE — AB
Candida vaginitis: POSITIVE — AB
Chlamydia: NEGATIVE
Neisseria Gonorrhea: NEGATIVE
Trichomonas: NEGATIVE

## 2018-06-11 ENCOUNTER — Ambulatory Visit: Payer: 59 | Admitting: Obstetrics & Gynecology

## 2018-06-11 ENCOUNTER — Other Ambulatory Visit: Payer: Self-pay | Admitting: Nurse Practitioner

## 2018-06-11 DIAGNOSIS — N760 Acute vaginitis: Secondary | ICD-10-CM

## 2018-06-11 DIAGNOSIS — B9689 Other specified bacterial agents as the cause of diseases classified elsewhere: Secondary | ICD-10-CM

## 2018-06-11 DIAGNOSIS — B379 Candidiasis, unspecified: Secondary | ICD-10-CM

## 2018-06-11 MED ORDER — FLUCONAZOLE 150 MG PO TABS: 150.0000 mg | ORAL_TABLET | Freq: Once | ORAL | 0 refills | Status: AC

## 2018-06-11 MED ORDER — METRONIDAZOLE 500 MG PO TABS: 500.0000 mg | ORAL_TABLET | Freq: Two times a day (BID) | ORAL | 0 refills | Status: DC

## 2018-06-30 ENCOUNTER — Other Ambulatory Visit: Payer: Self-pay | Admitting: Urgent Care

## 2018-07-10 ENCOUNTER — Inpatient Hospital Stay: Payer: 59

## 2018-07-15 ENCOUNTER — Inpatient Hospital Stay: Payer: 59 | Attending: Hematology and Oncology

## 2018-07-15 DIAGNOSIS — Z87891 Personal history of nicotine dependence: Secondary | ICD-10-CM | POA: Insufficient documentation

## 2018-07-15 DIAGNOSIS — D472 Monoclonal gammopathy: Secondary | ICD-10-CM | POA: Diagnosis not present

## 2018-07-15 DIAGNOSIS — Z86718 Personal history of other venous thrombosis and embolism: Secondary | ICD-10-CM | POA: Insufficient documentation

## 2018-07-15 DIAGNOSIS — Z79899 Other long term (current) drug therapy: Secondary | ICD-10-CM | POA: Insufficient documentation

## 2018-07-15 DIAGNOSIS — D6859 Other primary thrombophilia: Secondary | ICD-10-CM | POA: Insufficient documentation

## 2018-07-15 DIAGNOSIS — I825Z2 Chronic embolism and thrombosis of unspecified deep veins of left distal lower extremity: Secondary | ICD-10-CM

## 2018-07-15 DIAGNOSIS — Z7901 Long term (current) use of anticoagulants: Secondary | ICD-10-CM | POA: Insufficient documentation

## 2018-07-15 LAB — CBC WITH DIFFERENTIAL/PLATELET
Abs Immature Granulocytes: 0.02 10*3/uL (ref 0.00–0.07)
Basophils Absolute: 0.1 10*3/uL (ref 0.0–0.1)
Basophils Relative: 1 %
Eosinophils Absolute: 0.2 10*3/uL (ref 0.0–0.5)
Eosinophils Relative: 3 %
HCT: 35.7 % — ABNORMAL LOW (ref 36.0–46.0)
Hemoglobin: 11.7 g/dL — ABNORMAL LOW (ref 12.0–15.0)
Immature Granulocytes: 0 %
Lymphocytes Relative: 44 %
Lymphs Abs: 3.9 10*3/uL (ref 0.7–4.0)
MCH: 30.9 pg (ref 26.0–34.0)
MCHC: 32.8 g/dL (ref 30.0–36.0)
MCV: 94.2 fL (ref 80.0–100.0)
Monocytes Absolute: 0.8 10*3/uL (ref 0.1–1.0)
Monocytes Relative: 10 %
Neutro Abs: 3.6 10*3/uL (ref 1.7–7.7)
Neutrophils Relative %: 42 %
Platelets: 235 10*3/uL (ref 150–400)
RBC: 3.79 MIL/uL — ABNORMAL LOW (ref 3.87–5.11)
RDW: 12.3 % (ref 11.5–15.5)
WBC: 8.6 10*3/uL (ref 4.0–10.5)
nRBC: 0 % (ref 0.0–0.2)

## 2018-07-15 LAB — COMPREHENSIVE METABOLIC PANEL
ALT: 13 U/L (ref 0–44)
AST: 13 U/L — ABNORMAL LOW (ref 15–41)
Albumin: 4.1 g/dL (ref 3.5–5.0)
Alkaline Phosphatase: 44 U/L (ref 38–126)
Anion gap: 5 (ref 5–15)
BUN: 14 mg/dL (ref 6–20)
CO2: 26 mmol/L (ref 22–32)
Calcium: 8.5 mg/dL — ABNORMAL LOW (ref 8.9–10.3)
Chloride: 106 mmol/L (ref 98–111)
Creatinine, Ser: 0.85 mg/dL (ref 0.44–1.00)
GFR calc Af Amer: >60 mL/min (ref >60)
GFR calc non Af Amer: >60 mL/min (ref >60)
Glucose, Bld: 101 mg/dL — ABNORMAL HIGH (ref 70–99)
Potassium: 4.1 mmol/L (ref 3.5–5.1)
Sodium: 137 mmol/L (ref 135–145)
Total Bilirubin: 0.7 mg/dL (ref 0.3–1.2)
Total Protein: 7.4 g/dL (ref 6.5–8.1)

## 2018-07-17 ENCOUNTER — Telehealth: Payer: Self-pay | Admitting: Gastroenterology

## 2018-07-17 NOTE — Telephone Encounter (Signed)
Patient called & is scheduled for hemorrhoid on 07-30-2018 & is on a blood thinner Eliquis.She would also like to know the recovery time.

## 2018-07-22 NOTE — Telephone Encounter (Signed)
Spoke with pt regarding her questions about her upcoming hemorrhoid banding. Pt is now aware of what to expect during and after banding.

## 2018-07-30 ENCOUNTER — Ambulatory Visit: Payer: 59 | Admitting: Gastroenterology

## 2018-07-30 ENCOUNTER — Other Ambulatory Visit: Payer: Self-pay

## 2018-07-30 ENCOUNTER — Encounter: Payer: Self-pay | Admitting: Gastroenterology

## 2018-07-30 VITALS — BP 124/81 | HR 70 | Ht 66.0 in | Wt 181.8 lb

## 2018-07-30 DIAGNOSIS — K625 Hemorrhage of anus and rectum: Secondary | ICD-10-CM

## 2018-07-30 DIAGNOSIS — D500 Iron deficiency anemia secondary to blood loss (chronic): Secondary | ICD-10-CM | POA: Diagnosis not present

## 2018-07-30 DIAGNOSIS — K648 Other hemorrhoids: Secondary | ICD-10-CM | POA: Diagnosis not present

## 2018-07-30 NOTE — Progress Notes (Signed)
Cephas Darby, MD [address]  [place]  [place], [place] [address]  Main: [phone]  Fax: [phone]    Gastroenterology Consultation  Referring Provider:     [name], MD Primary Care Physician:  [name], MD Primary Gastroenterologist:  Dr. Jonathon Bellows Reason for Consultation:     Rectal bleeding        HPI:   Tammy Garza is a 38 y.o. female referred by Dr. [name], MD  for consultation & management of rectal bleeding.  Patient reports that she has been experiencing more than 6 months history of painless rectal bleeding.  She was previously seen by Dr. Vicente Males last year for dysphagia and underwent EGD which was unremarkable and no evidence of recently esophagitis.  She was empirically treated with PPI.  Originally, she was scheduled to see Dr. Vicente Males of her hemorrhoid banding for rectal bleeding.  Later, patient is moved to my schedule today for hemorrhoid banding.  Patient has history of protein C deficiency resulting in DVT for which she is on Eliquis.  She has been experiencing bright red blood per rectum, predominantly on wiping sometimes in the toilet bowel associated with rectal pain or discomfort and sometimes with prolapse of the hemorrhoids.  She denies irregular bowel habits.  She has been more than 15 minutes on toilet because she has sensation of incomplete emptying.  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  She is interested to undergo hemorrhoid ligation is been an ongoing issue.  Her most recent hemoglobin is 11.7 on 07/15/2018, last normal 13.3 in 04/2018.  She denies any other GI symptoms.  She denies weight loss  NSAIDs: None  Antiplts/Anticoagulants/Anti thrombotics: Eliquis for protein C deficiency, last dose today Patient denies family history of GI malignancy  GI Procedures:  EGD with Dr. Vicente Males 09/25/2017 - Normal examined duodenum. - Normal stomach. - Normal esophagus. Biopsied.  DIAGNOSIS:  A.  ESOPHAGUS; COLD BIOPSY:  - STRATIFIED SQUAMOUS EPITHELIUM WITHOUT EOSINOPHILS, NEUTROPHILS, OR  REACTIVE CHANGES.  - NEGATIVE FOR DYSPLASIA AND MALIGNANCY.  Past Medical History:  Diagnosis Date  . Anemia    h/o  . Cervical high risk human papillomavirus (HPV) DNA test positive   . Chronic gastritis   . Depression   . DVT (deep venous thrombosis) ([place]) 09/2016   left leg  . GERD (gastroesophageal reflux disease)   . H. pylori infection   . History of attempted suicide   . History of gastric polyp   . History of kidney stones    h/o  . Migraines   . Protein C deficiency ([facility])   . Rash   . Shift work sleep disorder     Past Surgical History:  Procedure Laterality Date  . ABDOMINAL HYSTERECTOMY    . ANTERIOR CRUCIATE LIGAMENT REPAIR Left   . CHOLECYSTECTOMY    . CYSTOSCOPY  12/19/2016   Procedure: CYSTOSCOPY;  Surgeon: Gae Dry, MD;  Location: [facility] OR[facility];  Service: Gynecology;;  . ESOPHAGOGASTRODUODENOSCOPY (EGD) WITH PROPOFOL N/A 09/25/2017   Procedure: ESOPHAGOGASTRODUODENOSCOPY (EGD) WITH PROPOFOL;  Surgeon: Jonathon Bellows, MD;  Location: [facility];  Service: Gastroenterology;  Laterality: N/A;  . LAPAROSCOPIC HYSTERECTOMY Bilateral 12/19/2016   Procedure: HYSTERECTOMY TOTAL LAPAROSCOPIC BILATERAL SALPINGECTOMY;  Surgeon: Gae Dry, MD;  Location: [facility] OR[facility];  Service: Gynecology;  Laterality: Bilateral;  . TUBAL LIGATION       Current Outpatient Medications:  .  clindamycin (CLEOCIN T) 1 % lotion, Apply 1 g  topically daily. On face, Disp: , Rfl:  .  DRYSOL 20 % external solution, Apply 1 g topically daily., Disp: , Rfl:  .  ELIQUIS 5 MG TABS tablet, TAKE 1 TABLET BY MOUTH TWICE A DAY, Disp: 60 tablet, Rfl: 1 .  gabapentin (NEURONTIN) 600 MG tablet, Take 600 mg by mouth daily., Disp: , Rfl: 5 .  glycopyrrolate (ROBINUL) 1 MG tablet, Take 3 mg by mouth daily., Disp: , Rfl: 2 .  tretinoin (RETIN-A) 0.025 % cream, Apply 1 g topically at bedtime., Disp: , Rfl:     Family History  Problem Relation Age of Onset  . HIV Sister   . Cancer Sister 42       cervical cancer  . Asthma Daughter   . Protein C deficiency Daughter   . Hypertension Maternal Grandmother   . Hypertension Mother   . Obesity Mother   . Cancer Father 3       lung cancer  . Deep vein thrombosis Father   . Protein C deficiency Father   . COPD Father   . Diabetes Brother   . COPD Paternal Grandmother   . Deep vein thrombosis Paternal Grandmother   . Protein C deficiency Daughter      Social History   Tobacco Use  . Smoking status: Former Smoker    Packs/day: 0.25    Years: 5.00    Pack years: 1.25    Types: Cigarettes    Start date: 05/15/2002    Last attempt to quit: 05/16/2007    Years since quitting: 11.2  . Smokeless tobacco: Never Used  Substance Use Topics  . Alcohol use: Yes    Alcohol/week: 1.0 standard drinks    Types: 1 Glasses of wine per week    Comment: occ wine none last 24 hrs  . Drug use: No    Allergies as of 07/30/2018 - Review Complete 07/30/2018  Allergen Reaction Noted  . Topamax [topiramate]  02/20/2018    Review of Systems:    All systems reviewed and negative except where noted in HPI.   Physical Exam:  BP 124/81   Pulse 70   Ht 5\' 6"  (1.676 m)   Wt 181 lb 12.8 oz (82.5 kg)   LMP 01/04/2016 Comment: partial hysterectomy  BMI 29.34 kg/m  Patient's last menstrual period was 01/04/2016.  General:   Alert,  Well-developed, well-nourished, pleasant and cooperative in NAD Head:  Normocephalic and atraumatic. Eyes:  Sclera clear, no icterus.   Conjunctiva pink. Ears:  Normal auditory acuity. Nose:  No deformity, discharge, or lesions. Mouth:  No deformity or lesions,oropharynx pink & moist. Neck:  Supple; no masses or thyromegaly. Lungs:  Respirations even and unlabored.  Clear throughout to auscultation.   No wheezes, crackles, or rhonchi. No acute distress. Heart:  Regular rate and rhythm; no murmurs, clicks, rubs, or gallops.  Abdomen:  Normal bowel sounds. Soft, non-tender and non-distended without masses, hepatosplenomegaly or hernias noted.  No guarding or rebound tenderness.   Rectal: Normal perianal skin, digital rectal exam revealed large external and internal hemorrhoids, nontender Msk:  Symmetrical without gross deformities. Good, equal movement & strength bilaterally. Pulses:  Normal pulses noted. Extremities:  No clubbing or edema.  No cyanosis. Neurologic:  Alert and oriented x3;  grossly normal neurologically. Skin:  Intact without significant lesions or rashes. No jaundice. Psych:  Alert and cooperative. Normal mood and affect.  Imaging Studies: Reviewed  Assessment and Plan:   Victor Granados is a 38 y.o. African-American female  with protein C deficiency resulting in DVT on long-term Eliquis is seen in consultation for more than 6 months history of painless rectal bleeding resulting in mild anemia.  Her history is highly consistent with bleeding from internal hemorrhoids.  I discussed with her about outpatient hemorrhoid ligation as well as diagnostic colonoscopy to rule out colorectal malignancy.  On digital rectal exam today, I did not appreciate any anal or low rectal lesions.  She is interested to undergo hemorrhoid ligation today and proceed with colonoscopy later  Explained her about the risks and benefits of hemorrhoid ligation, advised her to hold Eliquis for 2 to 3 days after banding to reduce the risk of post banding ulcer bleeding Patient is agreeable, consent obtained Perform hemorrhoid ligation today  Check iron studies, B12 and folate levels given mild normocytic anemia   Follow up in 1 to 2 months   Cephas Darby, MD

## 2018-07-31 LAB — IRON AND TIBC
Iron Saturation: 29 % (ref 15–55)
Iron: 80 ug/dL (ref 27–159)
Total Iron Binding Capacity: 279 ug/dL (ref 250–450)
UIBC: 199 ug/dL (ref 131–425)

## 2018-07-31 LAB — B12 AND FOLATE PANEL
Folate: 9.6 ng/mL (ref >3.0)
Vitamin B-12: 665 pg/mL (ref 232–1245)

## 2018-07-31 LAB — FERRITIN: Ferritin: 207 ng/mL — ABNORMAL HIGH (ref 15–150)

## 2018-07-31 NOTE — Progress Notes (Signed)

## 2018-09-03 ENCOUNTER — Ambulatory Visit: Payer: 59 | Admitting: Gastroenterology

## 2018-09-06 ENCOUNTER — Ambulatory Visit (INDEPENDENT_AMBULATORY_CARE_PROVIDER_SITE_OTHER): Payer: 59 | Admitting: Family Medicine

## 2018-09-06 ENCOUNTER — Other Ambulatory Visit: Payer: Self-pay

## 2018-09-06 ENCOUNTER — Encounter: Payer: Self-pay | Admitting: Family Medicine

## 2018-09-06 VITALS — BP 112/64 | HR 84 | Temp 98.5°F | Resp 16 | Ht 66.0 in | Wt 185.6 lb

## 2018-09-06 DIAGNOSIS — A64 Unspecified sexually transmitted disease: Secondary | ICD-10-CM | POA: Diagnosis not present

## 2018-09-06 DIAGNOSIS — E559 Vitamin D deficiency, unspecified: Secondary | ICD-10-CM

## 2018-09-06 DIAGNOSIS — M25521 Pain in right elbow: Secondary | ICD-10-CM

## 2018-09-06 DIAGNOSIS — Z01419 Encounter for gynecological examination (general) (routine) without abnormal findings: Secondary | ICD-10-CM | POA: Diagnosis not present

## 2018-09-06 DIAGNOSIS — L02415 Cutaneous abscess of right lower limb: Secondary | ICD-10-CM

## 2018-09-06 DIAGNOSIS — R739 Hyperglycemia, unspecified: Secondary | ICD-10-CM

## 2018-09-06 DIAGNOSIS — Z13220 Encounter for screening for lipoid disorders: Secondary | ICD-10-CM

## 2018-09-06 MED ORDER — DICLOFENAC SODIUM 1 % TD GEL: 2.0000 g | Freq: Four times a day (QID) | TRANSDERMAL | 1 refills | Status: DC

## 2018-09-06 MED ORDER — SULFAMETHOXAZOLE-TRIMETHOPRIM 800-160 MG PO TABS: 1.0000 | ORAL_TABLET | Freq: Two times a day (BID) | ORAL | 0 refills | Status: DC

## 2018-09-06 NOTE — Patient Instructions (Signed)
Preventive Care 18-39 Years, Female Preventive care refers to lifestyle choices and visits with your health care provider that can promote health and wellness. What does preventive care include?   A yearly physical exam. This is also called an annual well check.  Dental exams once or twice a year.  Routine eye exams. Ask your health care provider how often you should have your eyes checked.  Personal lifestyle choices, including: ? Daily care of your teeth and gums. ? Regular physical activity. ? Eating a healthy diet. ? Avoiding tobacco and drug use. ? Limiting alcohol use. ? Practicing safe sex. ? Taking vitamin and mineral supplements as recommended by your health care provider. What happens during an annual well check? The services and screenings done by your health care provider during your annual well check will depend on your age, overall health, lifestyle risk factors, and family history of disease. Counseling Your health care provider may ask you questions about your:  Alcohol use.  Tobacco use.  Drug use.  Emotional well-being.  Home and relationship well-being.  Sexual activity.  Eating habits.  Work and work environment.  Method of birth control.  Menstrual cycle.  Pregnancy history. Screening You may have the following tests or measurements:  Height, weight, and BMI.  Diabetes screening. This is done by checking your blood sugar (glucose) after you have not eaten for a while (fasting).  Blood pressure.  Lipid and cholesterol levels. These may be checked every 5 years starting at age 20.  Skin check.  Hepatitis C blood test.  Hepatitis B blood test.  Sexually transmitted disease (STD) testing.  BRCA-related cancer screening. This may be done if you have a family history of breast, ovarian, tubal, or peritoneal cancers.  Pelvic exam and Pap test. This may be done every 3 years starting at age 21. Starting at age 30, this may be done every 5  years if you have a Pap test in combination with an HPV test. Discuss your test results, treatment options, and if necessary, the need for more tests with your health care provider. Vaccines Your health care provider may recommend certain vaccines, such as:  Influenza vaccine. This is recommended every year.  Tetanus, diphtheria, and acellular pertussis (Tdap, Td) vaccine. You may need a Td booster every 10 years.  Varicella vaccine. You may need this if you have not been vaccinated.  HPV vaccine. If you are 26 or younger, you may need three doses over 6 months.  Measles, mumps, and rubella (MMR) vaccine. You may need at least one dose of MMR. You may also need a second dose.  Pneumococcal 13-valent conjugate (PCV13) vaccine. You may need this if you have certain conditions and were not previously vaccinated.  Pneumococcal polysaccharide (PPSV23) vaccine. You may need one or two doses if you smoke cigarettes or if you have certain conditions.  Meningococcal vaccine. One dose is recommended if you are age 19-21 years and a first-year college student living in a residence hall, or if you have one of several medical conditions. You may also need additional booster doses.  Hepatitis A vaccine. You may need this if you have certain conditions or if you travel or work in places where you may be exposed to hepatitis A.  Hepatitis B vaccine. You may need this if you have certain conditions or if you travel or work in places where you may be exposed to hepatitis B.  Haemophilus influenzae type b (Hib) vaccine. You may need this if you   have certain risk factors. Talk to your health care provider about which screenings and vaccines you need and how often you need them. This information is not intended to replace advice given to you by your health care provider. Make sure you discuss any questions you have with your health care provider. Document Released: 06/27/2001 Document Revised: 12/12/2016  Document Reviewed: 03/02/2015 Elsevier Interactive Patient Education  2019 [employer].

## 2018-09-06 NOTE — Progress Notes (Signed)
Name: Tammy Garza   MRN: [id number]    DOB: 12-09-1980   Date:09/06/2018       Progress Note  Subjective  Chief Complaint  Chief Complaint  Patient presents with  . Annual Exam    HPI   Patient presents for annual CPE and follow up  Right elbow pain: she fell in a parking lot Feb 2020 while taking her children to the doctor. The skin was scrapped , she developed pain right away, described as aching. Her job is physical and since than continues to have dull ache with activity, and also at night - it feels stiff during the night. No redness or swelling, pain level at this time is 4/10   Hyperglycemia: recent labs, but normal A1C she would like to be rechecked, no polyphagia, polydipsia or polyuria.   Knot on right inner thigh: going on for two weeks, more painful when sitting, no drainage, no trauma, no fever or chills. Using warm compresses without help, seems to be getting larger.   Diet: trying to eat healthy , cooking at home  Exercise: trying to be active   USPSTF grade A and B recommendations    Office Visit from 09/06/2018 in [facility]  AUDIT-C Score  1     Depression: Phq 9 is  negative Depression screen [facility] 2/9 09/06/2018 02/20/2018 08/20/2017 05/17/2017 11/20/2016  Decreased Interest 0 0 0 0 0  Down, Depressed, Hopeless 0 1 0 0 0  PHQ - 2 Score 0 1 0 0 0  Altered sleeping 2 0 - - -  Tired, decreased energy 1 0 - - -  Change in appetite 0 0 - - -  Feeling bad or failure about yourself  0 0 - - -  Trouble concentrating 0 0 - - -  Moving slowly or fidgety/restless 0 0 - - -  Suicidal thoughts 0 0 - - -  PHQ-9 Score 3 1 - - -  Difficult doing work/chores Not difficult at all Not difficult at all - - -   Hypertension: BP Readings from Last 3 Encounters:  09/06/18 112/64  07/30/18 124/81  06/06/18 116/78   Obesity: Wt Readings from Last 3 Encounters:  09/06/18 185 lb 9.6 oz (84.2 kg)  07/30/18 181 lb 12.8 oz (82.5 kg)  06/06/18 180 lb 9.6 oz  (81.9 kg)   BMI Readings from Last 3 Encounters:  09/06/18 29.96 kg/m  07/30/18 29.34 kg/m  06/06/18 29.15 kg/m    Hep C Screening: today  STD testing and prevention (HIV/chl/gon/syphilis): today  Intimate partner violence: negative screen  Sexual History/Pain during Intercourse: same partner for the past almost 5 years, no pain during intercourse  Menstrual History/LMP/Abnormal Bleeding: s/p hysterectomy  Incontinence Symptoms: mild when she sneezes, discussed kegel   Advanced Care Planning: A voluntary discussion about advance care planning including the explanation and discussion of advance directives.  Discussed health care proxy and Living will, and the patient was able to identify a health care proxy as father   Patient does not have a living will at present time.  Breast cancer:  Start next year BRCA gene screening: N/A Cervical cancer screening: s/p hysterectomy no cervix    Lipids:  Lab Results  Component Value Date   CHOL 181 02/20/2018   CHOL 152 07/26/2015   CHOL 216 (A) 05/05/2013   Lab Results  Component Value Date   HDL 65 02/20/2018   HDL 70 07/26/2015   HDL 66 05/05/2013   Lab Results  Component Value Date   LDLCALC 99 02/20/2018   LDLCALC 68 07/26/2015   LDLCALC 81 05/05/2013   Lab Results  Component Value Date   TRIG 84 02/20/2018   TRIG 70 07/26/2015   TRIG 105 05/05/2013   Lab Results  Component Value Date   CHOLHDL 2.8 02/20/2018   CHOLHDL 2.2 07/26/2015   No results found for: LDLDIRECT  Glucose:  Glucose  Date Value Ref Range Status  04/17/2014 81 65 - 99 mg/dL Final  04/16/2014 103 (H) 65 - 99 mg/dL Final  04/14/2014 77 65 - 99 mg/dL Final   Glucose, Bld  Date Value Ref Range Status  07/15/2018 101 (H) 70 - 99 mg/dL Final  04/18/2018 101 (H) 70 - 99 mg/dL Final  04/08/2018 76 70 - 99 mg/dL Final    Skin cancer: discussed atypical lesions    Patient Active Problem List   Diagnosis Date Noted  . Internal hemorrhoid  01/22/2018  . Protein C deficiency ([place]) 12/10/2016  . Monoclonal gammopathy of unknown significance (MGUS) 11/30/2016  . History of DVT (deep vein thrombosis) 11/02/2016  . Elevated blood protein 09/26/2016  . Chronic deep vein thrombosis (DVT) of distal vein of left lower extremity ([place]) 09/17/2016  . Vitamin D deficiency 07/26/2015  . Thyroid cyst 07/26/2015  . History of Helicobacter pylori infection 04/22/2015  . Migraine with aura and without status migrainosus 04/22/2015  . Shift work sleep disorder 04/22/2015    Past Surgical History:  Procedure Laterality Date  . ABDOMINAL HYSTERECTOMY    . ANTERIOR CRUCIATE LIGAMENT REPAIR Left   . CHOLECYSTECTOMY    . CYSTOSCOPY  12/19/2016   Procedure: CYSTOSCOPY;  Surgeon: Gae Dry, MD;  Location: [facility] ORS;  Service: Gynecology;;  . ESOPHAGOGASTRODUODENOSCOPY (EGD) WITH PROPOFOL N/A 09/25/2017   Procedure: ESOPHAGOGASTRODUODENOSCOPY (EGD) WITH PROPOFOL;  Surgeon: Jonathon Bellows, MD;  Location: [facility] ENDOSCOPY;  Service: Gastroenterology;  Laterality: N/A;  . LAPAROSCOPIC HYSTERECTOMY Bilateral 12/19/2016   Procedure: HYSTERECTOMY TOTAL LAPAROSCOPIC BILATERAL SALPINGECTOMY;  Surgeon: Gae Dry, MD;  Location: [facility] ORS;  Service: Gynecology;  Laterality: Bilateral;  . TUBAL LIGATION      Family History  Problem Relation Age of Onset  . HIV Sister   . Cancer Sister 27       cervical cancer  . Asthma Daughter   . Protein C deficiency Daughter   . Hypertension Maternal Grandmother   . Hypertension Mother   . Obesity Mother   . Cancer Father 86       lung cancer  . Deep vein thrombosis Father   . Protein C deficiency Father   . COPD Father   . Congestive Heart Failure Father   . Diabetes Brother   . COPD Paternal Grandmother   . Deep vein thrombosis Paternal Grandmother   . Protein C deficiency Daughter     Social History   Socioeconomic History  . Marital status: Divorced    Spouse name: Not on file  . Number of  children: 2  . Years of education: 93  . Highest education level: Associate degree: occupational, Hotel manager, or vocational program  Occupational History  . Not on file  Social Needs  . Financial resource strain: Somewhat hard  . Food insecurity:    Worry: Sometimes true    Inability: Sometimes true  . Transportation needs:    Medical: No    Non-medical: No  Tobacco Use  . Smoking status: Former Smoker    Packs/day: 0.25    Years: 5.00  Pack years: 1.25    Types: Cigarettes    Start date: 05/15/2002    Last attempt to quit: 05/16/2007    Years since quitting: 11.3  . Smokeless tobacco: Never Used  Substance and Sexual Activity  . Alcohol use: Yes    Alcohol/week: 1.0 standard drinks    Types: 1 Glasses of wine per week    Comment: occ wine none last 24 hrs  . Drug use: No  . Sexual activity: Yes    Partners: Male    Birth control/protection: Surgical    Comment: hysterectomy   Lifestyle  . Physical activity:    Days per week: 3 days    Minutes per session: 30 min  . Stress: To some extent  Relationships  . Social connections:    Talks on phone: More than three times a week    Gets together: Once a week    Attends religious service: More than 4 times per year    Active member of club or organization: Yes    Attends meetings of clubs or organizations: 1 to 4 times per year    Relationship status: Divorced  . Intimate partner violence:    Fear of current or ex partner: No    Emotionally abused: No    Physically abused: No    Forced sexual activity: No  Other Topics Concern  . Not on file  Social History Narrative   Separated since June 2016 - she has two children at home . The youngest is his daughter. Working at [employer] in [place].    Dating Gwyndolyn Saxon since 2015     Current Outpatient Medications:  .  clindamycin (CLEOCIN T) 1 % lotion, Apply 1 g topically daily. On face , Disp: , Rfl:  .  DRYSOL 20 % external solution, Apply 1 g topically daily., Disp: ,  Rfl:  .  ELIQUIS 5 MG TABS tablet, TAKE 1 TABLET BY MOUTH TWICE A DAY, Disp: 60 tablet, Rfl: 1 .  gabapentin (NEURONTIN) 600 MG tablet, Take 600 mg by mouth daily., Disp: , Rfl: 5 .  glycopyrrolate (ROBINUL) 1 MG tablet, Take 3 mg by mouth daily., Disp: , Rfl: 2 .  tretinoin (RETIN-A) 0.025 % cream, Apply 1 g topically at bedtime., Disp: , Rfl:  .  diclofenac sodium (VOLTAREN) 1 % GEL, Apply 2 g topically 4 (four) times daily., Disp: 100 g, Rfl: 1  Allergies  Allergen Reactions  . Topamax [Topiramate]     Confusion , slurred speech      ROS  Constitutional: Negative for fever or weight change.  Respiratory: Negative for cough and shortness of breath.   Cardiovascular: Negative for chest pain or palpitations.  Gastrointestinal: Negative for abdominal pain, no bowel changes.  Musculoskeletal: Negative for gait problem or joint swelling.  Skin: Negative for rash.  Neurological: Negative for dizziness or headache.  No other specific complaints in a complete review of systems (except as listed in HPI above).  Objective  Vitals:   09/06/18 1023  BP: 112/64  Pulse: 84  Resp: 16  Temp: 98.5 F (36.9 C)  TempSrc: Oral  SpO2: 97%  Weight: 185 lb 9.6 oz (84.2 kg)  Height: _0  (1.676 m)    Body mass index is 29.96 kg/m.  Physical Exam  Constitutional: Patient appears well-developed and well-nourished. No distress.  HENT: Head: Normocephalic and atraumatic. Ears: B TMs ok, no erythema or effusion; Nose: Nose normal. Mouth/Throat: Oropharynx is clear and moist. No oropharyngeal exudate.  Eyes:  Conjunctivae and EOM are normal. Pupils are equal, round, and reactive to light. No scleral icterus.  Neck: Normal range of motion. Neck supple. No JVD present. No thyromegaly present.  Cardiovascular: Normal rate, regular rhythm and normal heart sounds.  No murmur heard. No BLE edema. Pulmonary/Chest: Effort normal and breath sounds normal. No respiratory distress. Abdominal: Soft. Bowel  sounds are normal, no distension. There is no tenderness. no masses Breast: no lumps or masses, no nipple discharge or rashes FEMALE GENITALIA:  External genitalia normal External urethra normal Vaginal vault normal without discharge or lesions Pelvic not done  RECTAL: not done Musculoskeletal: Normal range of motion, no joint effusions. No gross deformities. Pain during palpation of olecranon  Neurological: he is alert and oriented to person, place, and time. No cranial nerve deficit. Coordination, balance, strength, speech and gait are normal.  Skin: Skin is warm and dry. No rash noted. No erythema. abscess on right inner thigh not fluctuant , tender to touch, no drainage Psychiatric: Patient has a normal mood and affect. behavior is normal. Judgment and thought content normal.   Recent Results (from the past 2160 hour(s))  CBC with Differential/Platelet     Status: Abnormal   Collection Time: 07/15/18 10:30 AM  Result Value Ref Range   WBC 8.6 4.0 - 10.5 K/uL   RBC 3.79 (L) 3.87 - 5.11 MIL/uL   Hemoglobin 11.7 (L) 12.0 - 15.0 g/dL   HCT 35.7 (L) 36.0 - 46.0 %   MCV 94.2 80.0 - 100.0 fL   MCH 30.9 26.0 - 34.0 pg   MCHC 32.8 30.0 - 36.0 g/dL   RDW 12.3 11.5 - 15.5 %   Platelets 235 150 - 400 K/uL   nRBC 0.0 0.0 - 0.2 %   Neutrophils Relative % 42 %   Neutro Abs 3.6 1.7 - 7.7 K/uL   Lymphocytes Relative 44 %   Lymphs Abs 3.9 0.7 - 4.0 K/uL   Monocytes Relative 10 %   Monocytes Absolute 0.8 0.1 - 1.0 K/uL   Eosinophils Relative 3 %   Eosinophils Absolute 0.2 0.0 - 0.5 K/uL   Basophils Relative 1 %   Basophils Absolute 0.1 0.0 - 0.1 K/uL   Immature Granulocytes 0 %   Abs Immature Granulocytes 0.02 0.00 - 0.07 K/uL    Comment: Performed at [facility], [address] [place], [place] [address]  Comprehensive metabolic panel     Status: Abnormal   Collection Time: 07/15/18 10:30 AM  Result Value Ref Range   Sodium 137 135 - 145 mmol/L   Potassium 4.1 3.5 - 5.1 mmol/L    Chloride 106 98 - 111 mmol/L   CO2 26 22 - 32 mmol/L   Glucose, Bld 101 (H) 70 - 99 mg/dL   BUN 14 6 - 20 mg/dL   Creatinine, Ser 0.85 0.44 - 1.00 mg/dL   Calcium 8.5 (L) 8.9 - 10.3 mg/dL   Total Protein 7.4 6.5 - 8.1 g/dL   Albumin 4.1 3.5 - 5.0 g/dL   AST 13 (L) 15 - 41 U/L   ALT 13 0 - 44 U/L   Alkaline Phosphatase 44 38 - 126 U/L   Total Bilirubin 0.7 0.3 - 1.2 mg/dL   GFR calc non Af Amer >60 >60 mL/min   GFR calc Af Amer >60 >60 mL/min   Anion gap 5 5 - 15    Comment: Performed at [facility], [address] [place], [place] [address]  Iron and TIBC  Status: None   Collection Time: 07/30/18  2:50 PM  Result Value Ref Range   Total Iron Binding Capacity 279 250 - 450 ug/dL   UIBC 199 131 - 425 ug/dL   Iron 80 27 - 159 ug/dL   Iron Saturation 29 15 - 55 %  Ferritin     Status: Abnormal   Collection Time: 07/30/18  2:52 PM  Result Value Ref Range   Ferritin 207 (H) 15 - 150 ng/mL  B12 and Folate Panel     Status: None   Collection Time: 07/30/18  2:52 PM  Result Value Ref Range   Vitamin B-12 665 232 - 1,245 pg/mL   Folate 9.6 >3.0 ng/mL    Comment: A serum folate concentration of less than 3.1 ng/mL is considered to represent clinical deficiency.       PHQ2/9: Depression screen [facility] 2/9 09/06/2018 02/20/2018 08/20/2017 05/17/2017 11/20/2016  Decreased Interest 0 0 0 0 0  Down, Depressed, Hopeless 0 1 0 0 0  PHQ - 2 Score 0 1 0 0 0  Altered sleeping 2 0 - - -  Tired, decreased energy 1 0 - - -  Change in appetite 0 0 - - -  Feeling bad or failure about yourself  0 0 - - -  Trouble concentrating 0 0 - - -  Moving slowly or fidgety/restless 0 0 - - -  Suicidal thoughts 0 0 - - -  PHQ-9 Score 3 1 - - -  Difficult doing work/chores Not difficult at all Not difficult at all - - -   Negative Phq9   Fall Risk: Fall Risk  09/06/2018 06/06/2018 05/29/2018 02/20/2018 08/20/2017  Falls in the past year? 1 0 0 No No  Number falls in past yr: 0 0 0 - -  Injury with  Fall? 1 0 0 - -  Comment Right Elbow - - - -     Assessment & Plan  1. Well woman exam   2. STI (sexually transmitted infection)  - RPR - Hepatitis panel, acute - HIV Antibody (routine testing w rflx)  3. Vitamin D deficiency  - VITAMIN D 25 Hydroxy (Vit-D Deficiency, Fractures)  4. Hyperglycemia  - Hemoglobin A1c  5. Lipid screening  - Lipid panel  6. Right elbow pain  - diclofenac sodium (VOLTAREN) 1 % GEL; Apply 2 g topically 4 (four) times daily.  Dispense: 100 g; Refill: 1  Going on for a couple of months, try topical medication, unable to take nsaid's since she takes Eliquis, call back for referral to Ortho if no improvement   -USPSTF grade A and B recommendations reviewed with patient; age-appropriate recommendations, preventive care, screening tests, etc discussed and encouraged; healthy living encouraged; see AVS for patient education given to patient -Discussed importance of 150 minutes of physical activity weekly, eat two servings of fish weekly, eat one serving of tree nuts ( cashews, pistachios, pecans, almonds.Marland Kitchen) every other day, eat 6 servings of fruit/vegetables daily and drink plenty of water and avoid sweet beverages.

## 2018-09-09 LAB — HEMOGLOBIN A1C
Hgb A1c MFr Bld: 4.9 % of total Hgb (ref <5.7)
Mean Plasma Glucose: 94 (calc)
eAG (mmol/L): 5.2 (calc)

## 2018-09-09 LAB — HEPATITIS PANEL, ACUTE
Hep A IgM: NONREACTIVE
Hep B C IgM: NONREACTIVE
Hepatitis B Surface Ag: NONREACTIVE
Hepatitis C Ab: NONREACTIVE
SIGNAL TO CUT-OFF: 0.04 (ref <1.00)

## 2018-09-09 LAB — VITAMIN D 25 HYDROXY (VIT D DEFICIENCY, FRACTURES): Vit D, 25-Hydroxy: 28 ng/mL — ABNORMAL LOW (ref 30–100)

## 2018-09-09 LAB — LIPID PANEL
Cholesterol: 153 mg/dL (ref <200)
HDL: 61 mg/dL (ref >=50)
LDL Cholesterol (Calc): 74 mg/dL (calc)
Non-HDL Cholesterol (Calc): 92 mg/dL (calc) (ref <130)
Total CHOL/HDL Ratio: 2.5 (calc) (ref <5.0)
Triglycerides: 94 mg/dL (ref <150)

## 2018-09-09 LAB — RPR: RPR Ser Ql: NONREACTIVE

## 2018-09-09 LAB — HIV ANTIBODY (ROUTINE TESTING W REFLEX): HIV 1&2 Ab, 4th Generation: NONREACTIVE

## 2018-09-11 ENCOUNTER — Other Ambulatory Visit: Payer: Self-pay | Admitting: *Deleted

## 2018-09-11 MED ORDER — APIXABAN 5 MG PO TABS: 5.0000 mg | ORAL_TABLET | Freq: Two times a day (BID) | ORAL | 1 refills | Status: DC

## 2018-10-01 ENCOUNTER — Other Ambulatory Visit: Payer: Self-pay | Admitting: Hematology and Oncology

## 2018-10-01 DIAGNOSIS — D472 Monoclonal gammopathy: Secondary | ICD-10-CM

## 2018-10-01 DIAGNOSIS — I825Z2 Chronic embolism and thrombosis of unspecified deep veins of left distal lower extremity: Secondary | ICD-10-CM

## 2018-10-08 ENCOUNTER — Inpatient Hospital Stay: Payer: 59 | Attending: Hematology and Oncology

## 2018-10-08 ENCOUNTER — Other Ambulatory Visit: Payer: Self-pay

## 2018-10-08 DIAGNOSIS — I82402 Acute embolism and thrombosis of unspecified deep veins of left lower extremity: Secondary | ICD-10-CM | POA: Insufficient documentation

## 2018-10-08 DIAGNOSIS — Z9049 Acquired absence of other specified parts of digestive tract: Secondary | ICD-10-CM | POA: Diagnosis not present

## 2018-10-08 DIAGNOSIS — G43909 Migraine, unspecified, not intractable, without status migrainosus: Secondary | ICD-10-CM | POA: Insufficient documentation

## 2018-10-08 DIAGNOSIS — Z8059 Family history of malignant neoplasm of other urinary tract organ: Secondary | ICD-10-CM | POA: Insufficient documentation

## 2018-10-08 DIAGNOSIS — Z87891 Personal history of nicotine dependence: Secondary | ICD-10-CM | POA: Diagnosis not present

## 2018-10-08 DIAGNOSIS — D472 Monoclonal gammopathy: Secondary | ICD-10-CM | POA: Insufficient documentation

## 2018-10-08 DIAGNOSIS — Z791 Long term (current) use of non-steroidal anti-inflammatories (NSAID): Secondary | ICD-10-CM | POA: Diagnosis not present

## 2018-10-08 DIAGNOSIS — D6859 Other primary thrombophilia: Secondary | ICD-10-CM | POA: Diagnosis not present

## 2018-10-08 DIAGNOSIS — Z888 Allergy status to other drugs, medicaments and biological substances status: Secondary | ICD-10-CM | POA: Diagnosis not present

## 2018-10-08 DIAGNOSIS — Z833 Family history of diabetes mellitus: Secondary | ICD-10-CM | POA: Diagnosis not present

## 2018-10-08 DIAGNOSIS — K219 Gastro-esophageal reflux disease without esophagitis: Secondary | ICD-10-CM | POA: Insufficient documentation

## 2018-10-08 DIAGNOSIS — Z90710 Acquired absence of both cervix and uterus: Secondary | ICD-10-CM | POA: Insufficient documentation

## 2018-10-08 DIAGNOSIS — Z915 Personal history of self-harm: Secondary | ICD-10-CM | POA: Insufficient documentation

## 2018-10-08 DIAGNOSIS — Z79899 Other long term (current) drug therapy: Secondary | ICD-10-CM | POA: Diagnosis not present

## 2018-10-08 DIAGNOSIS — Z801 Family history of malignant neoplasm of trachea, bronchus and lung: Secondary | ICD-10-CM | POA: Diagnosis not present

## 2018-10-08 DIAGNOSIS — Z7901 Long term (current) use of anticoagulants: Secondary | ICD-10-CM | POA: Insufficient documentation

## 2018-10-08 DIAGNOSIS — I825Z2 Chronic embolism and thrombosis of unspecified deep veins of left distal lower extremity: Secondary | ICD-10-CM

## 2018-10-08 LAB — COMPREHENSIVE METABOLIC PANEL
ALT: 13 U/L (ref 0–44)
AST: 15 U/L (ref 15–41)
Albumin: 4.3 g/dL (ref 3.5–5.0)
Alkaline Phosphatase: 43 U/L (ref 38–126)
Anion gap: 7 (ref 5–15)
BUN: 11 mg/dL (ref 6–20)
CO2: 24 mmol/L (ref 22–32)
Calcium: 8.3 mg/dL — ABNORMAL LOW (ref 8.9–10.3)
Chloride: 107 mmol/L (ref 98–111)
Creatinine, Ser: 0.75 mg/dL (ref 0.44–1.00)
GFR calc Af Amer: >60 mL/min (ref >60)
GFR calc non Af Amer: >60 mL/min (ref >60)
Glucose, Bld: 95 mg/dL (ref 70–99)
Potassium: 3.7 mmol/L (ref 3.5–5.1)
Sodium: 138 mmol/L (ref 135–145)
Total Bilirubin: 1.3 mg/dL — ABNORMAL HIGH (ref 0.3–1.2)
Total Protein: 7.6 g/dL (ref 6.5–8.1)

## 2018-10-08 LAB — CBC WITH DIFFERENTIAL/PLATELET
Abs Immature Granulocytes: 0.01 10*3/uL (ref 0.00–0.07)
Basophils Absolute: 0 10*3/uL (ref 0.0–0.1)
Basophils Relative: 1 %
Eosinophils Absolute: 0.3 10*3/uL (ref 0.0–0.5)
Eosinophils Relative: 4 %
HCT: 38 % (ref 36.0–46.0)
Hemoglobin: 12.2 g/dL (ref 12.0–15.0)
Immature Granulocytes: 0 %
Lymphocytes Relative: 50 %
Lymphs Abs: 3.2 10*3/uL (ref 0.7–4.0)
MCH: 30 pg (ref 26.0–34.0)
MCHC: 32.1 g/dL (ref 30.0–36.0)
MCV: 93.4 fL (ref 80.0–100.0)
Monocytes Absolute: 0.6 10*3/uL (ref 0.1–1.0)
Monocytes Relative: 10 %
Neutro Abs: 2.3 10*3/uL (ref 1.7–7.7)
Neutrophils Relative %: 35 %
Platelets: 247 10*3/uL (ref 150–400)
RBC: 4.07 MIL/uL (ref 3.87–5.11)
RDW: 11.9 % (ref 11.5–15.5)
WBC: 6.4 10*3/uL (ref 4.0–10.5)
nRBC: 0 % (ref 0.0–0.2)

## 2018-10-08 NOTE — Progress Notes (Signed)
[facility]  [address], [address] [place], [place] [address] Phone: [phone]  Fax: [phone]   Telemedicine Office Visit:  10/09/2018  Referring physician: [name], MD  I connected with Tammy Garza on 10/09/2018 at 12:08 PM by videoconferencing and verified that I was speaking with the correct person using 2 identifiers.  The patient was at home.  I discussed the limitations, risk, security and privacy concerns of performing an evaluation and management service by videoconferencing and the availability of in person appointments.  I also discussed with the patient that there may be a patient responsible charge related to this service.  The patient expressed understanding and agreed to proceed.   Chief Complaint: Tammy Garza is a 38 y.o. female with a DVT of the left lower extremity and protein C deficiency on Eliquis who is seen for 6 month assessment.  HPI: The patient was last seen in the hematology clinic on 04/08/2018. At that time, she was doing well.  She denied any bruising or bleeding.  Exam was normal.  She was seen in the [facility] ED on 04/18/2018 for severe headache, numbness and tingling in her fingers and aphasia. Head CT showed no evidence of a bleed. Brain MRI showed no acute abnormality. She was given IV fluids and migraine medicine.  She was felt to possibly have a complex migraine.  She sees Dr. Domingo Cocking for migraines.   She was seen in gastroenterology by Dr. Sherri Sear on 07/30/2018 for rectal bleeding due to hemorrhoids. She underwent hemorrhoid ligation.  Labs followed: 07/15/2018: WBC 8,600, hemoglobin 11.7, hematocrit 35.7.  07/30/2018: Ferritin 207, iron saturation 29%. B12 665. Folate 9.6.  09/06/2018: Acute hepatitis panel was negative. HIV negative.  10/08/2018: WBC 6,400, hemoglobin 12.2, hematocrit 38.0, platelets 247,000.  During the interim, she is "doing well." She reports she had some swelling in her left leg in  addition to spider veins behind her knee. She has been working 12hr days and standing a long time. Swelling has improved since she has taken a 30-day leave from work.   She denies any issues with bruising or bleeding. She denies any chest pain or shortness of breath.   Dr. Domingo Cocking increased her gabapentin to 600mg  twice daily. She reports a migraine intermittently for the past 3 days.   She is taking a 500mg  calcium supplement daily.    Past Medical History:  Diagnosis Date  . Anemia    h/o  . Cervical high risk human papillomavirus (HPV) DNA test positive   . Chronic gastritis   . Depression   . DVT (deep venous thrombosis) ([place]) 09/2016   left leg  . GERD (gastroesophageal reflux disease)   . H. pylori infection   . History of attempted suicide   . History of gastric polyp   . History of kidney stones    h/o  . Migraines   . Protein C deficiency ([place])   . Rash   . Shift work sleep disorder     Past Surgical History:  Procedure Laterality Date  . ABDOMINAL HYSTERECTOMY    . ANTERIOR CRUCIATE LIGAMENT REPAIR Left   . CHOLECYSTECTOMY    . CYSTOSCOPY  12/19/2016   Procedure: CYSTOSCOPY;  Surgeon: Gae Dry, MD;  Location: [facility] ORS;  Service: Gynecology;;  . ESOPHAGOGASTRODUODENOSCOPY (EGD) WITH PROPOFOL N/A 09/25/2017   Procedure: ESOPHAGOGASTRODUODENOSCOPY (EGD) WITH PROPOFOL;  Surgeon: Jonathon Bellows, MD;  Location: [facility] ENDOSCOPY;  Service: Gastroenterology;  Laterality: N/A;  . LAPAROSCOPIC HYSTERECTOMY Bilateral 12/19/2016  Procedure: HYSTERECTOMY TOTAL LAPAROSCOPIC BILATERAL SALPINGECTOMY;  Surgeon: Gae Dry, MD;  Location: [facility] ORS;  Service: Gynecology;  Laterality: Bilateral;  . TUBAL LIGATION      Family History  Problem Relation Age of Onset  . HIV Sister   . Cancer Sister 26       cervical cancer  . Asthma Daughter   . Protein C deficiency Daughter   . Hypertension Maternal Grandmother   . Hypertension Mother   . Obesity Mother   . Cancer Father  72       lung cancer  . Deep vein thrombosis Father   . Protein C deficiency Father   . COPD Father   . Congestive Heart Failure Father   . Diabetes Brother   . COPD Paternal Grandmother   . Deep vein thrombosis Paternal Grandmother   . Protein C deficiency Daughter     Social History:  reports that she quit smoking about 11 years ago. Her smoking use included cigarettes. She started smoking about 16 years ago. She has a 1.25 pack-year smoking history. She has never used smokeless tobacco. She reports current alcohol use of about 1.0 standard drinks of alcohol per week. 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  She wears a mask at work.  She has 2 daughters (ages 25 and 37).  The patient is accompanied by her 28yo daughter today.  Participants in the patient's visit and their role in the encounter included the patient, her daughter Tammy Garza), and Vito Berger, CMA, today.  The intake visit was provided by Vito Berger, CMA.  Allergies:  Allergies  Allergen Reactions  . Topamax [Topiramate]     Confusion , slurred speech     Current Medications: Current Outpatient Medications  Medication Sig Dispense Refill  . apixaban (ELIQUIS) 5 MG TABS tablet Take 1 tablet (5 mg total) by mouth 2 (two) times daily. 60 tablet 1  . clindamycin (CLEOCIN T) 1 % lotion Apply 1 g topically daily. On face     . diclofenac sodium (VOLTAREN) 1 % GEL Apply 2 g topically 4 (four) times daily. 100 g 1  . DRYSOL 20 % external solution Apply 1 g topically daily.    Marland Kitchen gabapentin (NEURONTIN) 600 MG tablet Take 600 mg by mouth 2 (two) times daily.   5  . glycopyrrolate (ROBINUL) 1 MG tablet Take 3 mg by mouth daily.  2  . tretinoin (RETIN-A) 0.025 % cream Apply 1 g topically at bedtime.     No current facility-administered medications for this visit.     Review of Systems  Constitutional: Negative.  Negative for chills,  diaphoresis, fever, malaise/fatigue and weight loss.       "Doing well."  HENT: Negative.  Negative for congestion, ear pain, hearing loss, nosebleeds, sinus pain and sore throat.   Eyes: Negative.  Negative for blurred vision, double vision and pain.  Respiratory: Negative.  Negative for cough, hemoptysis, sputum production and shortness of breath.   Cardiovascular: Positive for leg swelling (LLE). Negative for chest pain, palpitations, orthopnea and PND.  Gastrointestinal: Negative.  Negative for abdominal pain, blood in stool, constipation, diarrhea, melena, nausea and vomiting.  Genitourinary: Negative.  Negative for dysuria, frequency, hematuria and urgency.  Musculoskeletal: Negative.  Negative for back pain, falls, joint pain, myalgias and neck pain.       S/p knee surgery. No issues with ambulation.   Skin: Negative.  Negative for itching and rash.  Neurological: Positive for headaches (migraine, past 3 days). Negative for dizziness, tremors, sensory change, speech change, focal weakness and weakness.  Endo/Heme/Allergies: Negative.  Does not bruise/bleed easily.  Psychiatric/Behavioral: Negative.  Negative for depression and memory loss. The patient is not nervous/anxious and does not have insomnia.   All other systems reviewed and are negative.   Performance status (ECOG): 1  Physical Exam  Constitutional: She is oriented to person, place, and time. She appears well-developed and well-nourished. No distress.  HENT:  Head: Normocephalic and atraumatic.  Short dark hair.  Eyes: Pupils are equal, round, and reactive to light. Conjunctivae are normal. No scleral icterus.  Brown eyes.  Neurological: She is alert and oriented to person, place, and time.  Skin: She is not diaphoretic.  Tattoo.  Psychiatric: She has a normal mood and affect. Her behavior is normal. Judgment and thought content normal.  Nursing note and vitals reviewed.   Appointment on 10/08/2018  Component Date  Value Ref Range Status  . Sodium 10/08/2018 138  135 - 145 mmol/L Final  . Potassium 10/08/2018 3.7  3.5 - 5.1 mmol/L Final  . Chloride 10/08/2018 107  98 - 111 mmol/L Final  . CO2 10/08/2018 24  22 - 32 mmol/L Final  . Glucose, Bld 10/08/2018 95  70 - 99 mg/dL Final  . BUN 10/08/2018 11  6 - 20 mg/dL Final  . Creatinine, Ser 10/08/2018 0.75  0.44 - 1.00 mg/dL Final  . Calcium 10/08/2018 8.3* 8.9 - 10.3 mg/dL Final  . Total Protein 10/08/2018 7.6  6.5 - 8.1 g/dL Final  . Albumin 10/08/2018 4.3  3.5 - 5.0 g/dL Final  . AST 10/08/2018 15  15 - 41 U/L Final  . ALT 10/08/2018 13  0 - 44 U/L Final  . Alkaline Phosphatase 10/08/2018 43  38 - 126 U/L Final  . Total Bilirubin 10/08/2018 1.3* 0.3 - 1.2 mg/dL Final  . GFR calc non Af Amer 10/08/2018 >60  >60 mL/min Final  . GFR calc Af Amer 10/08/2018 >60  >60 mL/min Final  . Anion gap 10/08/2018 7  5 - 15 Final   Performed at [facility], [address], [place], [place] [address]  . WBC 10/08/2018 6.4  4.0 - 10.5 K/uL Final  . RBC 10/08/2018 4.07  3.87 - 5.11 MIL/uL Final  . Hemoglobin 10/08/2018 12.2  12.0 - 15.0 g/dL Final  . HCT 10/08/2018 38.0  36.0 - 46.0 % Final  . MCV 10/08/2018 93.4  80.0 - 100.0 fL Final  . MCH 10/08/2018 30.0  26.0 - 34.0 pg Final  . MCHC 10/08/2018 32.1  30.0 - 36.0 g/dL Final  . RDW 10/08/2018 11.9  11.5 - 15.5 % Final  . Platelets 10/08/2018 247  150 - 400 K/uL Final  . nRBC 10/08/2018 0.0  0.0 - 0.2 % Final  . Neutrophils Relative % 10/08/2018 35  % Final  . Neutro Abs 10/08/2018 2.3  1.7 - 7.7 K/uL Final  . Lymphocytes Relative 10/08/2018 50  % Final  . Lymphs Abs 10/08/2018 3.2  0.7 - 4.0 K/uL Final  . Monocytes Relative 10/08/2018 10  % Final  . Monocytes Absolute 10/08/2018 0.6  0.1 - 1.0 K/uL Final  . Eosinophils Relative 10/08/2018 4  % Final  . Eosinophils Absolute 10/08/2018 0.3  0.0 - 0.5 K/uL Final  . Basophils Relative 10/08/2018 1  % Final  . Basophils Absolute 10/08/2018 0.0  0.0 - 0.1  K/uL Final  .  Immature Granulocytes 10/08/2018 0  % Final  . Abs Immature Granulocytes 10/08/2018 0.01  0.00 - 0.07 K/uL Final   Performed at [facility], [address] [place], [place] [address]    Assessment:  Tammy Garza is a 38 y.o. female with protein C deficiency and a left lower extremity DVT following a 2 1/2 hour flight.  She had been on DepoProvera x 1 year.  She has a family history of thrombosis.  Left lower extremity duplex on 09/17/2016 revealed an acute appearing thrombus in the calf veins of the left lower extremity (posterior tibial and peroneal veins).  Bilateral lower extremity duplex on 11/20/2016 revealed no evidence of acute DVT within either lower extremity. There was some residual nonocclusive thrombus in the left peroneal vein, sequela of previous DVT on the prior scan.  Left lower extremity duplex on 07/05/2017 revealed an isolated thrombus of the left peroneal vein in the calf similar to 11/20/2016.  There was no acute thrombus.  She is on Eliquis.    Hypercoagulable work-up on 09/26/2016 and 11/30/2016 revealed the following negative studies:  Factor V Leiden, prothrombin gene mutation, lupus anticoagulant panel, anticardiolipin antibodies, beta2-glycoprotein, protein S antigen/activity, and ATIII antigen/activity.  She has protein C deficiency.  Labs on 11/30/2016 revealed a protein C total of 40% (60 - 150%) and protein C activity of 49% (73-180%).  Repeat testing on 03/29/2017 revealed a protein C total of 51% and a protein C activity of 51%.  She has a monoclonal gammopathy of unknown significance.  SPEP on 09/26/2016 was negative.  Immunofixation revealed an IgA monoclonal protein with lambda light chain specificity.  Free light chain assay was normal.  IgG was 698 ([phone]).  IgA was 182 (87-352).  24 hour UPEP on 10/22/2016 revealed no monoclonal protein.  SPEP on 03/29/2017 and 10/05/2017 was negative.  Free light chain ratio was normal on 09/26/2016  and 10/05/2017.  Chest, abdomen, and pelvic CT on 12/08/2016 revealed no lymphadenopathy or hepatosplenomegaly.  She underwent total laparoscopic hysterectomy with bilateral salpingectomy on 12/19/2016. She was off Eliquis x 3 days (2 days pre-op and day of surgery).  She developed a small superficial vein thrombus.  She underwent surgery (ACL reconstruction with autograft)  on 05/25/2017.  Left lower extremity duplex on 07/05/2017 revealed an isolated thrombus of the left peroneal vein in the calf.  As compared to 11/20/2016, the thrombus within the left peroneal vein appeared similar.  There was no acute thrombus.  Symptomatically, she is doing well.  She has had some lower extremity edema when standing for long periods of time.  Plan: 1.   Review labs from 10/08/2018. 2.   Protein C deficiency and left lower extremity DVT             Clinically doing well.  Continue Eliquis. 3.   Monoclonal gammopathy of unknown significance             Patient denies any B symptoms, issues with infections or bone pain.  Discuss ongoing surveillance every 6 months. 4.   Hypocalcemia  Increase calcium to BID. 5.   RTC in 3 months for labs (CBC with diff, CMP, myelnma panel). 6.   RTC in 6 months for MD assessment and labs (CBC with diff, CMP).  I discussed the assessment and treatment plan with the patient.  The patient was provided an opportunity to ask questions and all were answered.  The patient agreed with the plan and demonstrated an understanding of the instructions.  The patient was advised to call back or seek an in person evaluation if the symptoms worsen or if the condition fails to improve as anticipated.  I provided 12 minutes (12:08 PM - 12:20 PM) of face-to-face video visit time during this this encounter and > 50% was spent counseling as documented under my assessment and plan.  I provided these services from the [facility] office.   Nolon Stalls, MD, PhD  10/09/2018, 12:08 PM  I,  Molly Dorshimer, am acting as Education administrator for [employer]. Mike Gip, MD, PhD.  I, Melissa C. Mike Gip, MD, have reviewed the above documentation for accuracy and completeness, and I agree with the above.

## 2018-10-09 ENCOUNTER — Ambulatory Visit: Payer: 59

## 2018-10-09 ENCOUNTER — Other Ambulatory Visit: Payer: 59

## 2018-10-09 ENCOUNTER — Inpatient Hospital Stay (HOSPITAL_BASED_OUTPATIENT_CLINIC_OR_DEPARTMENT_OTHER): Payer: 59 | Admitting: Hematology and Oncology

## 2018-10-09 ENCOUNTER — Encounter: Payer: Self-pay | Admitting: Hematology and Oncology

## 2018-10-09 ENCOUNTER — Telehealth: Payer: Self-pay

## 2018-10-09 DIAGNOSIS — D472 Monoclonal gammopathy: Secondary | ICD-10-CM | POA: Diagnosis not present

## 2018-10-09 DIAGNOSIS — D6859 Other primary thrombophilia: Secondary | ICD-10-CM

## 2018-10-09 DIAGNOSIS — I825Z2 Chronic embolism and thrombosis of unspecified deep veins of left distal lower extremity: Secondary | ICD-10-CM | POA: Diagnosis not present

## 2018-10-09 NOTE — Telephone Encounter (Signed)
Spoke with Tammy Garza to inform her that her calcium is low at 8.5 and Dr Christean Leaf would like for her to start taking Calcium 1200 mg daily.The patient was understanding and agreeable.

## 2018-10-09 NOTE — Telephone Encounter (Signed)
-----   Message from Lequita Asal, MD sent at 10/08/2018  4:21 PM EDT ----- Regarding: Please call patient and PCP.   Calcium is low.  Needs to be on oral calcium.  M ----- Message ----- From: Buel Ream, Lab In [place] Sent: 10/08/2018   3:07 PM EDT To: Lequita Asal, MD

## 2018-10-09 NOTE — Progress Notes (Signed)
No new changes noted today. The patient Name, DOB and address has been verified by phone today. 

## 2018-10-11 LAB — MULTIPLE MYELOMA PANEL, SERUM
Albumin SerPl Elph-Mcnc: 3.8 g/dL (ref 2.9–4.4)
Albumin/Glob SerPl: 1.3 (ref 0.7–1.7)
Alpha 1: 0.2 g/dL (ref 0.0–0.4)
Alpha2 Glob SerPl Elph-Mcnc: 0.7 g/dL (ref 0.4–1.0)
B-Globulin SerPl Elph-Mcnc: 0.9 g/dL (ref 0.7–1.3)
Gamma Glob SerPl Elph-Mcnc: 1.3 g/dL (ref 0.4–1.8)
Globulin, Total: 3.1 g/dL (ref 2.2–3.9)
IgA: 243 mg/dL (ref 87–352)
IgG (Immunoglobin G), Serum: 1334 mg/dL (ref 586–1602)
IgM (Immunoglobulin M), Srm: 128 mg/dL (ref 26–217)
Total Protein ELP: 6.9 g/dL (ref 6.0–8.5)

## 2018-10-25 ENCOUNTER — Other Ambulatory Visit: Payer: Self-pay

## 2018-10-25 ENCOUNTER — Encounter: Payer: Self-pay | Admitting: Gastroenterology

## 2018-10-25 ENCOUNTER — Ambulatory Visit: Payer: 59 | Admitting: Gastroenterology

## 2018-10-25 VITALS — BP 124/79 | HR 85 | Temp 98.6°F | Resp 17 | Ht 66.0 in | Wt 186.6 lb

## 2018-10-25 DIAGNOSIS — K641 Second degree hemorrhoids: Secondary | ICD-10-CM

## 2018-10-25 NOTE — Progress Notes (Signed)
PROCEDURE NOTE: The patient presents with symptomatic grade 2 hemorrhoids, unresponsive to maximal medical therapy, requesting rubber band ligation of his/her hemorrhoidal disease.  All risks, benefits and alternative forms of therapy were described and informed consent was obtained.  Patient was off Eliquis for 2 days for banding  The decision was made to band the RA internal hemorrhoid, and the [place] was used to perform band ligation without complication.  Digital anorectal examination was then performed to assure proper positioning of the band, and to adjust the banded tissue as required.  The patient was discharged home without pain or other issues.  Dietary and behavioral recommendations were given and (if necessary - prescriptions were given), along with follow-up instructions.  The patient will return 2 weeks for follow-up and possible additional banding as required.  No complications were encountered and the patient tolerated the procedure well.  Recommend to restart Eliquis on Sunday  Erick Oxendine Overlea, MD [address]  [place]  [place], [place] [address]  Main: [phone]  Fax: [phone] Pager: [phone]

## 2018-11-05 ENCOUNTER — Other Ambulatory Visit: Payer: Self-pay

## 2018-11-05 ENCOUNTER — Ambulatory Visit: Payer: 59 | Admitting: Gastroenterology

## 2018-11-05 VITALS — BP 129/90 | HR 83 | Temp 98.6°F

## 2018-11-05 DIAGNOSIS — K641 Second degree hemorrhoids: Secondary | ICD-10-CM

## 2018-11-05 NOTE — Progress Notes (Signed)
PROCEDURE NOTE: The patient presents with symptomatic grade 2 hemorrhoids, unresponsive to maximal medical therapy, requesting rubber band ligation of his/her hemorrhoidal disease.  All risks, benefits and alternative forms of therapy were described and informed consent was obtained.  Patient is off Eliquis for 2 days  The decision was made to band the LL internal hemorrhoid, and the [place] was used to perform band ligation without complication.  Digital anorectal examination was then performed to assure proper positioning of the band, and to adjust the banded tissue as required.  The patient was discharged home without pain or other issues.  Dietary and behavioral recommendations were given and (if necessary - prescriptions were given), along with follow-up instructions.  The patient will return  as needed for follow-up and possible additional banding as required.  No complications were encountered and the patient tolerated the procedure well.  Resume Eliquis in 2 days  Cephas Darby, MD [address]  [place]  [place], [place] [address]  Main: [phone]  Fax: [phone] Pager: [phone]

## 2018-11-07 ENCOUNTER — Other Ambulatory Visit: Payer: Self-pay | Admitting: Hematology and Oncology

## 2018-11-20 ENCOUNTER — Other Ambulatory Visit: Payer: Self-pay

## 2018-11-20 DIAGNOSIS — K644 Residual hemorrhoidal skin tags: Secondary | ICD-10-CM

## 2018-12-02 ENCOUNTER — Ambulatory Visit (INDEPENDENT_AMBULATORY_CARE_PROVIDER_SITE_OTHER): Payer: 59 | Admitting: Family Medicine

## 2018-12-02 ENCOUNTER — Encounter: Payer: Self-pay | Admitting: Family Medicine

## 2018-12-02 ENCOUNTER — Other Ambulatory Visit: Payer: Self-pay

## 2018-12-02 DIAGNOSIS — G43109 Migraine with aura, not intractable, without status migrainosus: Secondary | ICD-10-CM

## 2018-12-02 DIAGNOSIS — D6859 Other primary thrombophilia: Secondary | ICD-10-CM | POA: Diagnosis not present

## 2018-12-02 DIAGNOSIS — M25562 Pain in left knee: Secondary | ICD-10-CM

## 2018-12-02 DIAGNOSIS — I825Z2 Chronic embolism and thrombosis of unspecified deep veins of left distal lower extremity: Secondary | ICD-10-CM

## 2018-12-02 DIAGNOSIS — E041 Nontoxic single thyroid nodule: Secondary | ICD-10-CM | POA: Diagnosis not present

## 2018-12-02 NOTE — Progress Notes (Signed)
Name: Tammy Garza   MRN: [id number]    DOB: 02-May-1981   Date:12/02/2018       Progress Note  Subjective  Chief Complaint  Chief Complaint  Patient presents with  . Migraine    she has severe episodes twice a year. She is having a moderate migraine now. She has treated it with prescribed medications. Migraine x 4 hours.   . DVT    I connected with  [name]  on 12/02/18 at  9:20 AM EDT by a video enabled telemedicine application and verified that I am speaking with the correct person using two identifiers.  I discussed the limitations of evaluation and management by telemedicine and the availability of in person appointments. The patient expressed understanding and agreed to proceed. Staff also discussed with the patient that there may be a patient responsible charge related to this service. Patient Location: at home Provider Location: [facility]   HPI  Protein C deficiency: sees Dr. Mike Gip and is on long term Eliquis since diagnosed withfirstDVT back in May 2018.No recent episodes of DVT or calf pain, but has chronic left DVT with intermittent lower left leg edema, she states that when she elevates her leg at work or wears compression stocking hoses and it helps. She states since she went down from 12 hours shifts to 8 hours 7 days a week, and takes FMLA when she has lack of sleep and triggers a migraine   Migraine headaches: she is taking gabapentin for the past year, sees Dr. Domingo Cocking , episodes down to 3-4 times per month, she is currently having an episode and is taking medications prescribed by Dr. Domingo Cocking. She states it started at 5:30 am today while at work , she took a baclofen and pain is now down to a 3 /10 and it is dull now  She has associated nausea, photophobia and phonophobia. Pain is described as temporal throbbing like.   Thyroid cyst: [place] was reassuring no need for biopsy or repeat study. Unchanged   Acne: she is now seeing Dr. Evorn Gong,  using topical medication and it is helping with symptoms   Patient Active Problem List   Diagnosis Date Noted  . Hypocalcemia 10/13/2018  . Internal hemorrhoid 01/22/2018  . Protein C deficiency ([place]) 12/10/2016  . Monoclonal gammopathy of unknown significance (MGUS) 11/30/2016  . History of DVT (deep vein thrombosis) 11/02/2016  . Elevated blood protein 09/26/2016  . Chronic deep vein thrombosis (DVT) of distal vein of left lower extremity ([place]) 09/17/2016  . Vitamin D deficiency 07/26/2015  . Thyroid cyst 07/26/2015  . History of Helicobacter pylori infection 04/22/2015  . Migraine with aura and without status migrainosus 04/22/2015  . Shift work sleep disorder 04/22/2015    Past Surgical History:  Procedure Laterality Date  . ABDOMINAL HYSTERECTOMY    . ANTERIOR CRUCIATE LIGAMENT REPAIR Left   . CHOLECYSTECTOMY    . CYSTOSCOPY  12/19/2016   Procedure: CYSTOSCOPY;  Surgeon: Gae Dry, MD;  Location: [facility] ORS;  Service: Gynecology;;  . ESOPHAGOGASTRODUODENOSCOPY (EGD) WITH PROPOFOL N/A 09/25/2017   Procedure: ESOPHAGOGASTRODUODENOSCOPY (EGD) WITH PROPOFOL;  Surgeon: Jonathon Bellows, MD;  Location: [facility] ENDOSCOPY;  Service: Gastroenterology;  Laterality: N/A;  . LAPAROSCOPIC HYSTERECTOMY Bilateral 12/19/2016   Procedure: HYSTERECTOMY TOTAL LAPAROSCOPIC BILATERAL SALPINGECTOMY;  Surgeon: Gae Dry, MD;  Location: [facility] ORS;  Service: Gynecology;  Laterality: Bilateral;  . TUBAL LIGATION      Family History  Problem Relation Age of Onset  . HIV Sister   .  Cancer Sister 47       cervical cancer  . Asthma Daughter   . Protein C deficiency Daughter   . Hypertension Maternal Grandmother   . Hypertension Mother   . Obesity Mother   . Cancer Father 25       lung cancer  . Deep vein thrombosis Father   . Protein C deficiency Father   . COPD Father   . Congestive Heart Failure Father   . Diabetes Brother   . COPD Paternal Grandmother   . Deep vein thrombosis Paternal  Grandmother   . Protein C deficiency Daughter     Social History   Socioeconomic History  . Marital status: Divorced    Spouse name: Not on file  . Number of children: 2  . Years of education: 31  . Highest education level: Associate degree: occupational, Hotel manager, or vocational program  Occupational History  . Not on file  Social Needs  . Financial resource strain: Somewhat hard  . Food insecurity    Worry: Sometimes true    Inability: Sometimes true  . Transportation needs    Medical: No    Non-medical: No  Tobacco Use  . Smoking status: Former Smoker    Packs/day: 0.25    Years: 5.00    Pack years: 1.25    Types: Cigarettes    Start date: 05/15/2002    Quit date: 05/16/2007    Years since quitting: 11.5  . Smokeless tobacco: Never Used  Substance and Sexual Activity  . Alcohol use: Yes    Alcohol/week: 1.0 standard drinks    Types: 1 Glasses of wine per week    Comment: occ wine none last 24 hrs  . Drug use: No  . Sexual activity: Yes    Partners: Male    Birth control/protection: Surgical    Comment: hysterectomy   Lifestyle  . Physical activity    Days per week: 3 days    Minutes per session: 30 min  . Stress: To some extent  Relationships  . Social connections    Talks on phone: More than three times a week    Gets together: Once a week    Attends religious service: More than 4 times per year    Active member of club or organization: Yes    Attends meetings of clubs or organizations: 1 to 4 times per year    Relationship status: Divorced  . Intimate partner violence    Fear of current or ex partner: No    Emotionally abused: No    Physically abused: No    Forced sexual activity: No  Other Topics Concern  . Not on file  Social History Narrative   Separated since June 2016 - she has two children at home . The youngest is his daughter. Working at [employer] in [place].    Dating Gwyndolyn Saxon since 2015     Current Outpatient Medications:  .  baclofen  (LIORESAL) 10 MG tablet, TAKE 1 TABLET BY MOUTH TWICE A DAY AS NEEDED (LIMIT 1 2 DAYS PER WEEK), Disp: , Rfl:  .  chlorproMAZINE (THORAZINE) 25 MG tablet, TAKE 1 TO 2 TABLETS AS NEEDED FOR HEADACHE RESCUE. MAY REPEAT EVERY 2 3 HOURS (MAX4/DAY), Disp: , Rfl:  .  clindamycin (CLEOCIN T) 1 % lotion, Apply 1 g topically daily. On face , Disp: , Rfl:  .  diclofenac sodium (VOLTAREN) 1 % GEL, Apply 2 g topically 4 (four) times daily., Disp: 100 g, Rfl: 1 .  DRYSOL 20 % external solution, Apply 1 g topically daily., Disp: , Rfl:  .  ELIQUIS 5 MG TABS tablet, TAKE 1 TABLET BY MOUTH TWICE A DAY, Disp: 60 tablet, Rfl: 1 .  gabapentin (NEURONTIN) 600 MG tablet, Take 600 mg by mouth 2 (two) times daily. , Disp: , Rfl: 5 .  glycopyrrolate (ROBINUL) 1 MG tablet, Take 3 mg by mouth daily., Disp: , Rfl: 2 .  tretinoin (RETIN-A) 0.025 % cream, Apply 1 g topically at bedtime., Disp: , Rfl:   Allergies  Allergen Reactions  . Topamax [Topiramate]     Confusion , slurred speech     I personally reviewed active problem list, medication list, allergies, family history, social history with the patient/caregiver today.   ROS  Ten systems reviewed and is negative except as mentioned in HPI   Objective  Virtual encounter, vitals not obtained.  There is no height or weight on file to calculate BMI.  Physical Exam  Awake, alert and oriented   PHQ2/9: Depression screen [facility] 2/9 12/02/2018 09/06/2018 02/20/2018 08/20/2017 05/17/2017  Decreased Interest 0 0 0 0 0  Down, Depressed, Hopeless 0 0 1 0 0  PHQ - 2 Score 0 0 1 0 0  Altered sleeping 0 2 0 - -  Tired, decreased energy 0 1 0 - -  Change in appetite 0 0 0 - -  Feeling bad or failure about yourself  0 0 0 - -  Trouble concentrating 0 0 0 - -  Moving slowly or fidgety/restless 0 0 0 - -  Suicidal thoughts 0 0 0 - -  PHQ-9 Score 0 3 1 - -  Difficult doing work/chores - Not difficult at all Not difficult at all - -   PHQ-2/9 Result is negative.    Fall  Risk: Fall Risk  12/02/2018 09/06/2018 06/06/2018 05/29/2018 02/20/2018  Falls in the past year? 0 1 0 0 No  Number falls in past yr: 0 0 0 0 -  Injury with Fall? 0 1 0 0 -  Comment - Right Elbow - - -     Assessment & Plan  1. Protein C deficiency ([place])  Under the care of Dr. Mike Gip   2. Migraine with aura and without status migrainosus, not intractable  Stable, keep follow up with Dr. Domingo Cocking , she has FMLA she is taking about 4 days off a month  3. Chronic deep vein thrombosis (DVT) of distal vein of left lower extremity ([facility])  Under the care of Dr. Mike Gip   4. Thyroid cyst  Unchanged   5. Left anterior knee pain  She states doing well, stopped using voltaren , doing better since working shorter hours   I discussed the assessment and treatment plan with the patient. The patient was provided an opportunity to ask questions and all were answered. The patient agreed with the plan and demonstrated an understanding of the instructions.  The patient was advised to call back or seek an in-person evaluation if the symptoms worsen or if the condition fails to improve as anticipated.  I provided 25 minutes of non-face-to-face time during this encounter.

## 2018-12-04 ENCOUNTER — Other Ambulatory Visit: Payer: Self-pay

## 2018-12-04 ENCOUNTER — Encounter: Payer: Self-pay | Admitting: Oncology

## 2018-12-04 ENCOUNTER — Ambulatory Visit: Payer: 59 | Admitting: General Surgery

## 2018-12-04 ENCOUNTER — Encounter: Payer: Self-pay | Admitting: Hematology and Oncology

## 2018-12-04 ENCOUNTER — Inpatient Hospital Stay: Payer: 59 | Attending: Hematology and Oncology | Admitting: Oncology

## 2018-12-04 ENCOUNTER — Ambulatory Visit
Admission: RE | Admit: 2018-12-04 | Discharge: 2018-12-04 | Disposition: A | Discharge status: Home / Self Care | Payer: 59 | Source: Ambulatory Visit | Attending: Oncology | Admitting: Oncology

## 2018-12-04 ENCOUNTER — Ambulatory Visit
Admission: RE | Admit: 2018-12-04 | Discharge: 2018-12-04 | Disposition: A | Discharge status: Home / Self Care | Payer: 59 | Source: Home / Self Care | Attending: Oncology | Admitting: Oncology

## 2018-12-04 DIAGNOSIS — M25572 Pain in left ankle and joints of left foot: Secondary | ICD-10-CM

## 2018-12-04 DIAGNOSIS — I825Z2 Chronic embolism and thrombosis of unspecified deep veins of left distal lower extremity: Secondary | ICD-10-CM | POA: Diagnosis not present

## 2018-12-04 NOTE — Progress Notes (Addendum)
Symptom Management Consult note [facility]  [phone] [phone]  Patient Care Team: [name], MD as PCP - General (Family Medicine) Lequita Asal, MD as Referring Physician (Hematology and Oncology) Jonathon Bellows, MD as Consulting Physician (Gastroenterology) Dasher, Rayvon Char, MD (Dermatology) Orie Rout, MD as Referring Physician (Specialist) Jonathon Bellows, MD as Consulting Physician (Gastroenterology)   Name of the patient: Tammy Garza  [id number]  [birth year]   Date of visit: 12/04/2018   Virtual Visit via Telephone Note  I connected with Tammy Garza on 12/04/18 at 10:00 AM EDT by telephone and verified that I am speaking with the correct person using two identifiers.  Location: Patient: Home Provider: Office   I discussed the limitations, risks, security and privacy concerns of performing an evaluation and management service by telephone and the availability of in person appointments. I also discussed with the patient that there may be a patient responsible charge related to this service. The patient expressed understanding and agreed to proceed.  Diagnosis-DVT of left lower extremity and protein C deficiency currently on Eliquis  Chief complaint/ Reason for visit-swollen and tender left ankle; numbness  Heme/Onc history: Patient is followed by Dr. Mike Gip and hematology for a DVT of the left lower extremity and protein C deficiency.  She is currently on Eliquis.  Tolerating well without episodes of bleeding or easy bruising.  PMH includes an ED admission in December 2019 for severe headaches, numbness and tingling in her fingers with aphasia.  CT and MRI scan were negative.  She also has history of rectal bleeding due to hemorrhoids and is evaluated by Dr. Marius Ditch.  Recently had hemorrhoid ligation.  Interval history-patient presents to [facility] today via virtual visit for complaints of left anterior ankle pain  that began last night around 6:30 PM.  States she feels "a knot".  She denies injury or striking her ankle.  She denies any strenuous activities.  She does work 12-hour night shifts and was working when pain occurred. She has taken 2- 350 mg Tylenol with little to no relief.  Has intermittent numbness of left toes.  Denies missing any doses of Eliquis.  He denies any recent fevers or illness, easy bleeding or bruising, good appetite and denies weight loss.  She denies shortness of breath or chest pain, nausea, vomiting, constipation or diarrhea.  ECOG FS:1 - Symptomatic but completely ambulatory  Review of systems- Review of Systems  Constitutional: Negative.  Negative for chills, fever, malaise/fatigue and weight loss.  HENT: Negative for congestion, ear pain and tinnitus.   Eyes: Negative.  Negative for blurred vision and double vision.  Respiratory: Negative.  Negative for cough, sputum production and shortness of breath.   Cardiovascular: Negative.  Negative for chest pain, palpitations and leg swelling.  Gastrointestinal: Negative.  Negative for abdominal pain, constipation, diarrhea, nausea and vomiting.  Genitourinary: Negative for dysuria, frequency and urgency.  Musculoskeletal: Negative for back pain and falls.       Left ankle pain  Skin: Negative.  Negative for rash.  Neurological: Positive for tingling (left toes). Negative for weakness and headaches.  Endo/Heme/Allergies: Negative.  Does not bruise/bleed easily.  Psychiatric/Behavioral: Negative.  Negative for depression. The patient is not nervous/anxious and does not have insomnia.     Current treatment-  Eliquis   Allergies  Allergen Reactions  . Topamax [Topiramate]     Confusion , slurred speech      Past Medical History:  Diagnosis Date  . Anemia  h/o  . Cervical high risk human papillomavirus (HPV) DNA test positive   . Chronic gastritis   . Depression   . DVT (deep venous thrombosis) ([place]) 09/2016   left  leg  . GERD (gastroesophageal reflux disease)   . H. pylori infection   . History of attempted suicide   . History of gastric polyp   . History of kidney stones    h/o  . Migraines   . Protein C deficiency ([place])   . Rash   . Shift work sleep disorder      Past Surgical History:  Procedure Laterality Date  . ABDOMINAL HYSTERECTOMY    . ANTERIOR CRUCIATE LIGAMENT REPAIR Left   . CHOLECYSTECTOMY    . CYSTOSCOPY  12/19/2016   Procedure: CYSTOSCOPY;  Surgeon: Gae Dry, MD;  Location: [facility] ORS;  Service: Gynecology;;  . ESOPHAGOGASTRODUODENOSCOPY (EGD) WITH PROPOFOL N/A 09/25/2017   Procedure: ESOPHAGOGASTRODUODENOSCOPY (EGD) WITH PROPOFOL;  Surgeon: Jonathon Bellows, MD;  Location: [facility] ENDOSCOPY;  Service: Gastroenterology;  Laterality: N/A;  . LAPAROSCOPIC HYSTERECTOMY Bilateral 12/19/2016   Procedure: HYSTERECTOMY TOTAL LAPAROSCOPIC BILATERAL SALPINGECTOMY;  Surgeon: Gae Dry, MD;  Location: [facility] ORS;  Service: Gynecology;  Laterality: Bilateral;  . TUBAL LIGATION      Social History   Socioeconomic History  . Marital status: Divorced    Spouse name: Not on file  . Number of children: 2  . Years of education: 24  . Highest education level: Associate degree: occupational, Hotel manager, or vocational program  Occupational History  . Not on file  Social Needs  . Financial resource strain: Somewhat hard  . Food insecurity    Worry: Sometimes true    Inability: Sometimes true  . Transportation needs    Medical: No    Non-medical: No  Tobacco Use  . Smoking status: Former Smoker    Packs/day: 0.25    Years: 5.00    Pack years: 1.25    Types: Cigarettes    Start date: 05/15/2002    Quit date: 05/16/2007    Years since quitting: 11.5  . Smokeless tobacco: Never Used  Substance and Sexual Activity  . Alcohol use: Yes    Alcohol/week: 1.0 standard drinks    Types: 1 Glasses of wine per week    Comment: occ wine none last 24 hrs  . Drug use: No  . Sexual activity: Yes     Partners: Male    Birth control/protection: Surgical    Comment: hysterectomy   Lifestyle  . Physical activity    Days per week: 3 days    Minutes per session: 30 min  . Stress: To some extent  Relationships  . Social connections    Talks on phone: More than three times a week    Gets together: Once a week    Attends religious service: More than 4 times per year    Active member of club or organization: Yes    Attends meetings of clubs or organizations: 1 to 4 times per year    Relationship status: Divorced  . Intimate partner violence    Fear of current or ex partner: No    Emotionally abused: No    Physically abused: No    Forced sexual activity: No  Other Topics Concern  . Not on file  Social History Narrative   Separated since June 2016 - she has two children at home . The youngest is his daughter. Working at [employer] in [place].    Dating Gwyndolyn Saxon  since 2015    Family History  Problem Relation Age of Onset  . HIV Sister   . Cancer Sister 55       cervical cancer  . Asthma Daughter   . Protein C deficiency Daughter   . Hypertension Maternal Grandmother   . Hypertension Mother   . Obesity Mother   . Cancer Father 85       lung cancer  . Deep vein thrombosis Father   . Protein C deficiency Father   . COPD Father   . Congestive Heart Failure Father   . Diabetes Brother   . COPD Paternal Grandmother   . Deep vein thrombosis Paternal Grandmother   . Protein C deficiency Daughter      Current Outpatient Medications:  .  baclofen (LIORESAL) 10 MG tablet, TAKE 1 TABLET BY MOUTH TWICE A DAY AS NEEDED (LIMIT 1 2 DAYS PER WEEK), Disp: , Rfl:  .  chlorproMAZINE (THORAZINE) 25 MG tablet, TAKE 1 TO 2 TABLETS AS NEEDED FOR HEADACHE RESCUE. MAY REPEAT EVERY 2 3 HOURS (MAX4/DAY), Disp: , Rfl:  .  clindamycin (CLEOCIN T) 1 % lotion, Apply 1 g topically daily. On face , Disp: , Rfl:  .  diclofenac sodium (VOLTAREN) 1 % GEL, Apply 2 g topically 4 (four) times daily.,  Disp: 100 g, Rfl: 1 .  DRYSOL 20 % external solution, Apply 1 g topically daily., Disp: , Rfl:  .  ELIQUIS 5 MG TABS tablet, TAKE 1 TABLET BY MOUTH TWICE A DAY, Disp: 60 tablet, Rfl: 1 .  gabapentin (NEURONTIN) 600 MG tablet, Take 600 mg by mouth 2 (two) times daily. , Disp: , Rfl: 5 .  glycopyrrolate (ROBINUL) 1 MG tablet, Take 3 mg by mouth daily., Disp: , Rfl: 2 .  tretinoin (RETIN-A) 0.025 % cream, Apply 1 g topically at bedtime., Disp: , Rfl:   Physical exam: There were no vitals filed for this visit. Physical Exam Constitutional:      Appearance: Normal appearance.  HENT:     Head: Normocephalic and atraumatic.  Eyes:     Pupils: Pupils are equal, round, and reactive to light.  Neck:     Musculoskeletal: Normal range of motion.  Cardiovascular:     Rate and Rhythm: Normal rate and regular rhythm.     Heart sounds: Normal heart sounds. No murmur.  Pulmonary:     Effort: Pulmonary effort is normal.     Breath sounds: Normal breath sounds. No wheezing.  Abdominal:     General: Bowel sounds are normal. There is no distension.     Palpations: Abdomen is soft.     Tenderness: There is no abdominal tenderness.  Musculoskeletal: Normal range of motion.     Left ankle: She exhibits swelling. Tenderness.       Feet:  Skin:    General: Skin is warm and dry.     Findings: No rash.  Neurological:     Mental Status: She is alert and oriented to person, place, and time.  Psychiatric:        Judgment: Judgment normal.    Media Information    Document Information   CMP Latest Ref Rng & Units 10/08/2018  Glucose 70 - 99 mg/dL 95  BUN 6 - 20 mg/dL 11  Creatinine 0.44 - 1.00 mg/dL 0.75  Sodium 135 - 145 mmol/L 138  Potassium 3.5 - 5.1 mmol/L 3.7  Chloride 98 - 111 mmol/L 107  CO2 22 - 32 mmol/L 24  Calcium 8.9 -  10.3 mg/dL 8.3(L)  Total Protein 6.5 - 8.1 g/dL 7.6  Total Bilirubin 0.3 - 1.2 mg/dL 1.3(H)  Alkaline Phos 38 - 126 U/L 43  AST 15 - 41 U/L 15  ALT 0 - 44 U/L 13    CBC Latest Ref Rng & Units 10/08/2018  WBC 4.0 - 10.5 K/uL 6.4  Hemoglobin 12.0 - 15.0 g/dL 12.2  Hematocrit 36.0 - 46.0 % 38.0  Platelets 150 - 400 K/uL 247    No images are attached to the encounter.  No results found.   Assessment and plan- Patient is a 38 y.o. female who presents to [facility] for left ankle pain that began last night.  Patient notes some swelling to left ankle extending down to left foot.  Intermittent tingling of toes and intermittent calf pain.  History of DVT and protein C deficiency: History of DVT after 2-1/2-hour plane flight on 09/17/2016.  Also has family history of thrombus. Currently on Eliquis.  Tolerating well.  Has had additional DVTs since initiating Eliquis due to surgeries requiring her to stop her Eliquis.  None recently.  Left ankle pain/numbness in toes and intermittent calf pain: Given her history, I would recommend a left extremity ultrasound to rule out DVT and a left ankle x-ray.  Given she has not missed any doses of Eliquis, this is likely not a DVT but would like to rule out.    Plan: Stat left lower extremity Doppler to rule out DVT. Stat left ankle x-ray to rule out injury.  Disposition: We will call patient with results. She is scheduled to return to clinic in August for labs and to see Dr. Mike Gip.  Addendum: Imaging is negative. Recommend Rest, Elevation, Compression and Ice. Will touch base with her in a few days to see if improvement is noted.   Visit Diagnosis 1. Acute left ankle pain   2. Chronic deep vein thrombosis (DVT) of distal vein of left lower extremity ([place])      I discussed the assessment and treatment plan with the patient. The patient was provided an opportunity to ask questions and all were answered. The patient agreed with the plan and demonstrated an understanding of the instructions.   The patient was advised to call back or seek an in-person evaluation if the symptoms worsen or if the condition fails to improve as  anticipated.  I provided 25 minutes of non-face-to-face time during this encounter.  Jacquelin Hawking, NP

## 2018-12-11 ENCOUNTER — Ambulatory Visit: Payer: 59 | Admitting: General Surgery

## 2018-12-11 ENCOUNTER — Other Ambulatory Visit: Payer: Self-pay

## 2018-12-11 ENCOUNTER — Encounter: Payer: Self-pay | Admitting: General Surgery

## 2018-12-11 ENCOUNTER — Encounter: Payer: Self-pay | Admitting: *Deleted

## 2018-12-11 VITALS — BP 103/68 | HR 71 | Temp 97.7°F | Ht 66.0 in | Wt 183.0 lb

## 2018-12-11 DIAGNOSIS — K644 Residual hemorrhoidal skin tags: Secondary | ICD-10-CM | POA: Diagnosis not present

## 2018-12-11 NOTE — Progress Notes (Signed)
Patient ID: Tammy Garza, female   DOB: 06-Apr-1981, 38 y.o.   MRN: [id number]  Chief Complaint  Patient presents with  . New Patient (Initial Visit)    anal skin tag    HPI Tammy Garza is a 38 y.o. female.   She has been referred for surgical evaluation of anal skin tags.  She states that they have been present for at least a year.  She does have a history of hemorrhoids.  She recently underwent hemorrhoidal banding of multiple internal hemorrhoidal pillars by Dr. Marius Ditch.  She is interested in surgical removal of perianal skin tags.  She says that she finds the area difficult to keep clean and they sometimes become irritated and itchy.  There is occasional bleeding at the base of the tags when they become irritated.  She denies constipation.  She denies any pain or bleeding with bowel movements.  She says that she is unable to wear thong-style lingerie due to pain and irritation of the skin tags.  Of clinical relevance, she has a history of left lower extremity DVT secondary to protein C deficiency and is on lifelong anticoagulation.  She is taking Eliquis and this is being managed by Dr. Mike Gip in hematology oncology.   Past Medical History:  Diagnosis Date  . Anemia    h/o  . Cervical high risk human papillomavirus (HPV) DNA test positive   . Chronic gastritis   . Depression   . DVT (deep venous thrombosis) ([place]) 09/2016   left leg  . GERD (gastroesophageal reflux disease)   . H. pylori infection   . History of attempted suicide   . History of gastric polyp   . History of kidney stones    h/o  . Hyperhidrosis   . Migraines   . Protein C deficiency ([place])   . Rash   . Shift work sleep disorder     Past Surgical History:  Procedure Laterality Date  . ABDOMINAL HYSTERECTOMY    . ANTERIOR CRUCIATE LIGAMENT REPAIR Left   . CHOLECYSTECTOMY    . CYSTOSCOPY  12/19/2016   Procedure: CYSTOSCOPY;  Surgeon: Gae Dry, MD;  Location: [facility] ORS;  Service: Gynecology;;  .  ESOPHAGOGASTRODUODENOSCOPY (EGD) WITH PROPOFOL N/A 09/25/2017   Procedure: ESOPHAGOGASTRODUODENOSCOPY (EGD) WITH PROPOFOL;  Surgeon: Jonathon Bellows, MD;  Location: [facility] ENDOSCOPY;  Service: Gastroenterology;  Laterality: N/A;  . LAPAROSCOPIC HYSTERECTOMY Bilateral 12/19/2016   Procedure: HYSTERECTOMY TOTAL LAPAROSCOPIC BILATERAL SALPINGECTOMY;  Surgeon: Gae Dry, MD;  Location: [facility] ORS;  Service: Gynecology;  Laterality: Bilateral;  . TUBAL LIGATION      Family History  Problem Relation Age of Onset  . HIV Sister   . Cancer Sister 6       cervical cancer  . Asthma Daughter   . Protein C deficiency Daughter   . Hypertension Maternal Grandmother   . Hypertension Mother   . Obesity Mother   . Cancer Father 88       lung cancer  . Deep vein thrombosis Father   . Protein C deficiency Father   . COPD Father   . Congestive Heart Failure Father   . Diabetes Brother   . COPD Paternal Grandmother   . Deep vein thrombosis Paternal Grandmother   . Protein C deficiency Daughter     Social History Social History   Tobacco Use  . Smoking status: Former Smoker    Packs/day: 0.25    Years: 5.00    Pack years: 1.25    Types: Cigarettes  Start date: 05/15/2002    Quit date: 05/16/2007    Years since quitting: 11.5  . Smokeless tobacco: Never Used  Substance Use Topics  . Alcohol use: Yes    Alcohol/week: 1.0 standard drinks    Types: 1 Glasses of wine per week    Comment: occ wine none last 24 hrs  . Drug use: No    Allergies  Allergen Reactions  . Topamax [Topiramate]     Confusion , slurred speech     Current Outpatient Medications  Medication Sig Dispense Refill  . baclofen (LIORESAL) 10 MG tablet TAKE 1 TABLET BY MOUTH TWICE A DAY AS NEEDED (LIMIT 1 2 DAYS PER WEEK)    . chlorproMAZINE (THORAZINE) 25 MG tablet TAKE 1 TO 2 TABLETS AS NEEDED FOR HEADACHE RESCUE. MAY REPEAT EVERY 2 3 HOURS (MAX4/DAY)    . clindamycin (CLEOCIN T) 1 % lotion Apply 1 g topically daily. On  face     . diclofenac sodium (VOLTAREN) 1 % GEL Apply 2 g topically 4 (four) times daily. 100 g 1  . DRYSOL 20 % external solution Apply 1 g topically daily.    Marland Kitchen ELIQUIS 5 MG TABS tablet TAKE 1 TABLET BY MOUTH TWICE A DAY 60 tablet 1  . gabapentin (NEURONTIN) 600 MG tablet Take 600 mg by mouth 2 (two) times daily.   5  . glycopyrrolate (ROBINUL) 1 MG tablet Take 3 mg by mouth daily.  2  . tretinoin (RETIN-A) 0.025 % cream Apply 1 g topically at bedtime.     No current facility-administered medications for this visit.     Review of Systems Review of Systems  Cardiovascular: Positive for leg swelling.       Secondary to left lower extremity DVT  Gastrointestinal:       GERD  Hematological:       Clotting disorder secondary to protein C deficiency, taking Eliquis  All other systems reviewed and are negative.   Blood pressure 103/68, pulse 71, temperature 97.7 F (36.5 C), height 5\' 6"  (1.676 m), weight 183 lb (83 kg), last menstrual period 01/04/2016, SpO2 94 %.  Physical Exam Physical Exam Vitals signs reviewed. Exam conducted with a chaperone present.  Constitutional:      General: She is not in acute distress.    Appearance: Normal appearance.  HENT:     Head: Normocephalic and atraumatic.     Nose:     Comments: Covered with a mask secondary to COVID-19 precautions    Mouth/Throat:     Comments: Covered with a mask secondary to COVID-19 precautions Eyes:     General: No scleral icterus.       Right eye: No discharge.        Left eye: No discharge.     Extraocular Movements: Extraocular movements intact.  Neck:     Musculoskeletal: Normal range of motion.     Comments: No palpable thyroid masses or thyromegaly identified. Cardiovascular:     Rate and Rhythm: Regular rhythm.     Pulses: Normal pulses.     Heart sounds: Normal heart sounds.  Pulmonary:     Effort: Pulmonary effort is normal.     Breath sounds: Normal breath sounds.  Abdominal:     General: Bowel  sounds are normal.     Palpations: Abdomen is soft.  Genitourinary:    Exam position: Knee-chest position.       Comments: Skin tags in the perianal skin, consistent with prior episodes of hemorrhoids.  There are no stigmata of bleeding. Musculoskeletal: Normal range of motion.     Left lower leg: Edema present.     Comments: Trace, nonpitting  Lymphadenopathy:     Cervical: No cervical adenopathy.  Skin:    General: Skin is warm and dry.  Neurological:     General: No focal deficit present.     Mental Status: She is alert and oriented to person, place, and time.  Psychiatric:        Mood and Affect: Mood normal.        Behavior: Behavior normal.        Thought Content: Thought content normal.     Data Reviewed I reviewed the reports from Dr. Marius Ditch, describing the banding of multiple internal hemorrhoidal pillars performed on March 17, June 2, and November 05, 2018.  These describe symptomatic grade 2 hemorrhoids successfully banded in her clinic.  Assessment This is a 38 year old woman who has a history of hemorrhoid disease.  She has had banding of her symptomatic internal hemorrhoids.  She has external skin tags as sequelae of prior episodes of hemorrhoids.  She is interested in surgical removal due to irritation and difficulty with hygiene.  Plan I discussed the procedure with her today.  This would be performed under anesthesia in the operating room.  She will need to hold her Eliquis for several days prior to the operation as well as several days afterwards.  We will request medical clearance and recommendations for this.  We discussed the risks of bleeding, infection, and pain.  I also discussed that we typically just leave these alone, however due to her discomfort with them, I have agreed to resect them.  She accepts the risks and wishes to proceed.    Fredirick Maudlin 12/11/2018, 3:01 PM

## 2018-12-11 NOTE — Progress Notes (Addendum)
Patient needs to have an EUA and excision anal skin tags with Dr. Celine Ahr.   The patient does need to have the okay to stop Eliquis prior to surgery. The patient reports that Dr. Mike Gip prescribes this for her. I have faxed over a formal request to Dr. Kem Parkinson office today. Once we receive the okay, patient will be contacted to arrange surgery accordingly.   Patient aware of the above and that our office will call once we have clearance.   Surgery instructions and paperwork were provided to the patient today.  Patient is aware that she will need to have COVID testing done prior. We will try and coordinate COVID testing with Pre-admit appointment per patient request.

## 2018-12-11 NOTE — Patient Instructions (Signed)
You will be scheduled for surgery at [facility] to remove your anal skin tag.  You can expect to be out of work for 2 weeks.

## 2018-12-17 ENCOUNTER — Telehealth: Payer: Self-pay | Admitting: *Deleted

## 2018-12-17 ENCOUNTER — Encounter: Payer: Self-pay | Admitting: *Deleted

## 2018-12-17 NOTE — Telephone Encounter (Signed)
Patient contacted today and aware surgery has been scheduled for 01-01-19 at [facility].   The patient is aware to go for Pre-admit appointment and COVID testing on 12-27-18 at 1:45 pm. She is aware to check in at the [facility] entrance.

## 2018-12-17 NOTE — Telephone Encounter (Signed)
Patient was contacted today and notified that clearance was received from Dr. Kem Parkinson office. The patient is aware to hold Eliquis 2 days prior to surgery.   The patient would like to get surgery scheduled with Dr. Celine Ahr for 01-01-19 at [facility].   The patient will need to have COVID testing on 12-27-18. We will try and coordinate COVID testing and pre-admit appointment.   Patient will be contacted for further instructions once [facility] posts. She verbalizes understanding.

## 2018-12-17 NOTE — Progress Notes (Signed)
Clearance received from Dr. Nolon Stalls, who has placed patient at medium to high risk for surgery. She recommends patient to hold Eliquis for 2 days prior to surgery. Restart Eliquis when cleared by surgery. Dr. Mike Gip spoke with Dr. Celine Ahr.

## 2018-12-18 ENCOUNTER — Other Ambulatory Visit: Payer: Self-pay | Admitting: General Surgery

## 2018-12-18 DIAGNOSIS — K644 Residual hemorrhoidal skin tags: Secondary | ICD-10-CM

## 2018-12-23 ENCOUNTER — Telehealth: Payer: Self-pay | Admitting: General Surgery

## 2018-12-23 NOTE — Telephone Encounter (Signed)
Patient called and said she would like to move her surgery to October. Please call patient and advise.

## 2018-12-24 NOTE — Telephone Encounter (Signed)
Patient would like to move her surgery out to October due to work schedule and the Covid virus.  Patient's surgery to be rescheduled for 02/26/19 at [facility] with Dr. Celine Ahr.  The patient is aware to have COVID-19 testing done on 02/21/19 at the [facility] building drive thru ([address] [place]) between 8:00 am and 10:30 am. She is aware to isolate after, have no visitors, wash hands frequently, and avoid touching face.   She is aware that she will need to stop her Eliquis 2 days prior and her last dose will be on 12/24/18.   The patient is aware she will need to Pre-Admit. Patient will check in at the [place] entrance due to COVID-19 restrictions and will then be escorted to the [place], [address] (first floor). Patient will be contacted once Pre-admission appointment has been arranged with date and time.   Patient aware to be NPO after midnight and have a driver.   She is aware to check in at the [place] entrance where she will be screened for the coronavirus and then sent to [facility].   Patient aware that she may have one visitor due to COVID-19 restrictions.   The patient verbalizes understanding of the above.   The patient is aware to call the office should she have further questions.

## 2018-12-27 ENCOUNTER — Other Ambulatory Visit: Payer: 59

## 2018-12-31 ENCOUNTER — Telehealth: Payer: Self-pay

## 2018-12-31 NOTE — Telephone Encounter (Signed)
Message left for the patient to call back for surgery information.  The patient is scheduled for surgery on 02/26/19.  She will pre admit on 02/20/19 at 10:00 am and will enter in through the [place] entrance.  She will have her Covid-19 testing done on 02/21/19 at the [place] drive up testing.

## 2019-01-02 NOTE — Telephone Encounter (Signed)
Patient notified of pre admit test date and time and covid test date.

## 2019-01-09 ENCOUNTER — Other Ambulatory Visit: Payer: 59

## 2019-01-11 ENCOUNTER — Other Ambulatory Visit: Payer: Self-pay | Admitting: Hematology and Oncology

## 2019-01-15 ENCOUNTER — Other Ambulatory Visit: Payer: Self-pay

## 2019-01-15 ENCOUNTER — Inpatient Hospital Stay: Payer: 59 | Attending: Hematology and Oncology

## 2019-01-15 DIAGNOSIS — M25572 Pain in left ankle and joints of left foot: Secondary | ICD-10-CM | POA: Insufficient documentation

## 2019-01-15 DIAGNOSIS — D6859 Other primary thrombophilia: Secondary | ICD-10-CM

## 2019-01-15 DIAGNOSIS — I825Z2 Chronic embolism and thrombosis of unspecified deep veins of left distal lower extremity: Secondary | ICD-10-CM | POA: Diagnosis present

## 2019-01-15 DIAGNOSIS — D472 Monoclonal gammopathy: Secondary | ICD-10-CM

## 2019-01-15 LAB — COMPREHENSIVE METABOLIC PANEL
ALT: 13 U/L (ref 0–44)
AST: 13 U/L — ABNORMAL LOW (ref 15–41)
Albumin: 4.1 g/dL (ref 3.5–5.0)
Alkaline Phosphatase: 42 U/L (ref 38–126)
Anion gap: 8 (ref 5–15)
BUN: 18 mg/dL (ref 6–20)
CO2: 23 mmol/L (ref 22–32)
Calcium: 8.8 mg/dL — ABNORMAL LOW (ref 8.9–10.3)
Chloride: 105 mmol/L (ref 98–111)
Creatinine, Ser: 0.66 mg/dL (ref 0.44–1.00)
GFR calc Af Amer: >60 mL/min (ref >60)
GFR calc non Af Amer: >60 mL/min (ref >60)
Glucose, Bld: 95 mg/dL (ref 70–99)
Potassium: 3.7 mmol/L (ref 3.5–5.1)
Sodium: 136 mmol/L (ref 135–145)
Total Bilirubin: 0.4 mg/dL (ref 0.3–1.2)
Total Protein: 7.5 g/dL (ref 6.5–8.1)

## 2019-01-15 LAB — CBC WITH DIFFERENTIAL/PLATELET
Abs Immature Granulocytes: 0.02 10*3/uL (ref 0.00–0.07)
Basophils Absolute: 0 10*3/uL (ref 0.0–0.1)
Basophils Relative: 1 %
Eosinophils Absolute: 0.2 10*3/uL (ref 0.0–0.5)
Eosinophils Relative: 3 %
HCT: 36.9 % (ref 36.0–46.0)
Hemoglobin: 12.4 g/dL (ref 12.0–15.0)
Immature Granulocytes: 0 %
Lymphocytes Relative: 53 %
Lymphs Abs: 3.9 10*3/uL (ref 0.7–4.0)
MCH: 31 pg (ref 26.0–34.0)
MCHC: 33.6 g/dL (ref 30.0–36.0)
MCV: 92.3 fL (ref 80.0–100.0)
Monocytes Absolute: 0.6 10*3/uL (ref 0.1–1.0)
Monocytes Relative: 8 %
Neutro Abs: 2.5 10*3/uL (ref 1.7–7.7)
Neutrophils Relative %: 35 %
Platelets: 224 10*3/uL (ref 150–400)
RBC: 4 MIL/uL (ref 3.87–5.11)
RDW: 11.9 % (ref 11.5–15.5)
WBC: 7.3 10*3/uL (ref 4.0–10.5)
nRBC: 0 % (ref 0.0–0.2)

## 2019-01-16 LAB — MULTIPLE MYELOMA PANEL, SERUM
Albumin SerPl Elph-Mcnc: 4.1 g/dL (ref 2.9–4.4)
Albumin/Glob SerPl: 1.5 (ref 0.7–1.7)
Alpha 1: 0.2 g/dL (ref 0.0–0.4)
Alpha2 Glob SerPl Elph-Mcnc: 0.6 g/dL (ref 0.4–1.0)
B-Globulin SerPl Elph-Mcnc: 0.8 g/dL (ref 0.7–1.3)
Gamma Glob SerPl Elph-Mcnc: 1.3 g/dL (ref 0.4–1.8)
Globulin, Total: 2.9 g/dL (ref 2.2–3.9)
IgA: 232 mg/dL (ref 87–352)
IgG (Immunoglobin G), Serum: 1418 mg/dL (ref 586–1602)
IgM (Immunoglobulin M), Srm: 120 mg/dL (ref 26–217)
Total Protein ELP: 7 g/dL (ref 6.0–8.5)

## 2019-01-23 ENCOUNTER — Encounter: Payer: Self-pay | Admitting: Obstetrics & Gynecology

## 2019-01-23 ENCOUNTER — Ambulatory Visit (INDEPENDENT_AMBULATORY_CARE_PROVIDER_SITE_OTHER): Payer: 59 | Admitting: Obstetrics & Gynecology

## 2019-01-23 ENCOUNTER — Other Ambulatory Visit: Payer: Self-pay

## 2019-01-23 VITALS — BP 120/80 | Ht 66.0 in | Wt 183.0 lb

## 2019-01-23 DIAGNOSIS — Z01419 Encounter for gynecological examination (general) (routine) without abnormal findings: Secondary | ICD-10-CM | POA: Diagnosis not present

## 2019-01-23 NOTE — Progress Notes (Signed)
HPI:      Tammy Garza is a 38 y.o. [id number] who LMP was Patient's last menstrual period was 01/04/2016., she presents today for her annual examination. The patient has no complaints today. The patient is sexually active. Her last pap: was normal and she has had hysterectomy for fibroids. The patient does perform self breast exams.  There is no notable family history of breast or ovarian cancer in her family.  The patient has regular exercise: yes.  The patient denies current symptoms of depression.    GYN History: Contraception: status post hysterectomy  PMHx: Past Medical History:  Diagnosis Date  . Anemia    h/o  . Cervical high risk human papillomavirus (HPV) DNA test positive   . Chronic gastritis   . Depression   . DVT (deep venous thrombosis) ([place]) 09/2016   left leg  . GERD (gastroesophageal reflux disease)   . H. pylori infection   . History of attempted suicide   . History of gastric polyp   . History of kidney stones    h/o  . Hyperhidrosis   . Migraines   . Protein C deficiency ([place])   . Rash   . Shift work sleep disorder    Past Surgical History:  Procedure Laterality Date  . ABDOMINAL HYSTERECTOMY    . ANTERIOR CRUCIATE LIGAMENT REPAIR Left   . CHOLECYSTECTOMY    . CYSTOSCOPY  12/19/2016   Procedure: CYSTOSCOPY;  Surgeon: Gae Dry, MD;  Location: [facility] ORS;  Service: Gynecology;;  . ESOPHAGOGASTRODUODENOSCOPY (EGD) WITH PROPOFOL N/A 09/25/2017   Procedure: ESOPHAGOGASTRODUODENOSCOPY (EGD) WITH PROPOFOL;  Surgeon: Jonathon Bellows, MD;  Location: [facility] ENDOSCOPY;  Service: Gastroenterology;  Laterality: N/A;  . LAPAROSCOPIC HYSTERECTOMY Bilateral 12/19/2016   Procedure: HYSTERECTOMY TOTAL LAPAROSCOPIC BILATERAL SALPINGECTOMY;  Surgeon: Gae Dry, MD;  Location: [facility] ORS;  Service: Gynecology;  Laterality: Bilateral;  . TUBAL LIGATION     Family History  Problem Relation Age of Onset  . HIV Sister   . Cancer Sister 34       cervical cancer  .  Asthma Daughter   . Protein C deficiency Daughter   . Hypertension Maternal Grandmother   . Hypertension Mother   . Obesity Mother   . Cancer Father 38       lung cancer  . Deep vein thrombosis Father   . Protein C deficiency Father   . COPD Father   . Congestive Heart Failure Father   . Diabetes Brother   . COPD Paternal Grandmother   . Deep vein thrombosis Paternal Grandmother   . Protein C deficiency Daughter    Social History   Tobacco Use  . Smoking status: Former Smoker    Packs/day: 0.25    Years: 5.00    Pack years: 1.25    Types: Cigarettes    Start date: 05/15/2002    Quit date: 05/16/2007    Years since quitting: 11.6  . Smokeless tobacco: Never Used  Substance Use Topics  . Alcohol use: Yes    Alcohol/week: 1.0 standard drinks    Types: 1 Glasses of wine per week    Comment: occ wine none last 24 hrs  . Drug use: No    Current Outpatient Medications:  .  baclofen (LIORESAL) 10 MG tablet, TAKE 1 TABLET BY MOUTH TWICE A DAY AS NEEDED (LIMIT 1 2 DAYS PER WEEK), Disp: , Rfl:  .  chlorproMAZINE (THORAZINE) 25 MG tablet, TAKE 1 TO 2 TABLETS AS NEEDED FOR HEADACHE  RESCUE. MAY REPEAT EVERY 2 3 HOURS (MAX4/DAY), Disp: , Rfl:  .  clindamycin (CLEOCIN T) 1 % lotion, Apply 1 g topically daily. On face , Disp: , Rfl:  .  diclofenac sodium (VOLTAREN) 1 % GEL, Apply 2 g topically 4 (four) times daily., Disp: 100 g, Rfl: 1 .  DRYSOL 20 % external solution, Apply 1 g topically daily., Disp: , Rfl:  .  ELIQUIS 5 MG TABS tablet, TAKE 1 TABLET BY MOUTH TWICE A DAY, Disp: 60 tablet, Rfl: 1 .  gabapentin (NEURONTIN) 600 MG tablet, Take 600 mg by mouth 2 (two) times daily. , Disp: , Rfl: 5 .  glycopyrrolate (ROBINUL) 1 MG tablet, Take 3 mg by mouth daily., Disp: , Rfl: 2 .  tretinoin (RETIN-A) 0.025 % cream, Apply 1 g topically at bedtime., Disp: , Rfl:  Allergies: Topamax [topiramate]  Review of Systems  Constitutional: Negative for chills, fever and malaise/fatigue.  HENT:  Negative for congestion, sinus pain and sore throat.   Eyes: Negative for blurred vision and pain.  Respiratory: Negative for cough and wheezing.   Cardiovascular: Negative for chest pain and leg swelling.  Gastrointestinal: Negative for abdominal pain, constipation, diarrhea, heartburn, nausea and vomiting.  Genitourinary: Negative for dysuria, frequency, hematuria and urgency.  Musculoskeletal: Negative for back pain, joint pain, myalgias and neck pain.  Skin: Negative for itching and rash.  Neurological: Negative for dizziness, tremors and weakness.  Endo/Heme/Allergies: Does not bruise/bleed easily.  Psychiatric/Behavioral: Negative for depression. The patient is not nervous/anxious and does not have insomnia.     Objective: BP 120/80   Ht 5\' 6"  (1.676 m)   Wt 183 lb (83 kg)   LMP 01/04/2016 Comment: partial hysterectomy, tubal   BMI 29.54 kg/m   Filed Weights   01/23/19 1030  Weight: 183 lb (83 kg)   Body mass index is 29.54 kg/m. Physical Exam Constitutional:      General: She is not in acute distress.    Appearance: She is well-developed.  Genitourinary:     Pelvic exam was performed with patient supine.     Vagina and rectum normal.     No lesions in the vagina.     No vaginal bleeding.     No right or left adnexal mass present.     Right adnexa not tender.     Left adnexa not tender.     Genitourinary Comments: Absent Uterus Absent cervix Vaginal cuff well healed  HENT:     Head: Normocephalic and atraumatic. No laceration.     Right Ear: Hearing normal.     Left Ear: Hearing normal.     Mouth/Throat:     Pharynx: Uvula midline.  Eyes:     Pupils: Pupils are equal, round, and reactive to light.  Neck:     Musculoskeletal: Normal range of motion and neck supple.     Thyroid: No thyromegaly.  Cardiovascular:     Rate and Rhythm: Normal rate and regular rhythm.     Heart sounds: No murmur. No friction rub. No gallop.   Pulmonary:     Effort: Pulmonary  effort is normal. No respiratory distress.     Breath sounds: Normal breath sounds. No wheezing.  Chest:     Breasts:        Right: No mass, skin change or tenderness.        Left: No mass, skin change or tenderness.  Abdominal:     General: Bowel sounds are normal. There is no  distension.     Palpations: Abdomen is soft.     Tenderness: There is no abdominal tenderness. There is no rebound.  Musculoskeletal: Normal range of motion.  Neurological:     Mental Status: She is alert and oriented to person, place, and time.     Cranial Nerves: No cranial nerve deficit.  Skin:    General: Skin is warm and dry.  Psychiatric:        Judgment: Judgment normal.  Vitals signs reviewed.     Assessment:  ANNUAL EXAM 1. Women's annual routine gynecological examination     Screening Plan:            1.  Cervical Screening-  Pap smear schedule reviewed with patient, Pap smear not performed  2. Breast screening- Exam annually and mammogram>40 planned (Next Year)  3. Colonoscopy every 10 years, Hemoccult testing - after age 83  4. Labs managed by PCP     F/U  Return in about 1 year (around 01/23/2020) for Annual.  Barnett Applebaum, MD, [facility], [facility] 01/23/2019  10:52 AM

## 2019-02-12 ENCOUNTER — Telehealth: Payer: Self-pay | Admitting: *Deleted

## 2019-02-12 NOTE — Telephone Encounter (Signed)
Spoke with patient. She needs letter stating she is scheduled for surgery 02/26/2019 and will be out for approximately 2 weeks. Letter faxed to [phone].

## 2019-02-12 NOTE — Telephone Encounter (Signed)
Patient called and stated that she is having surgery with Dr.Cannon on 02/26/19 (excision of rectal skin tag) and her Job is wanting a letter stating when and how long she needs to be out of work and what restrictions. Please call and advise

## 2019-02-20 ENCOUNTER — Encounter
Admission: RE | Admit: 2019-02-20 | Discharge: 2019-02-20 | Disposition: A | Discharge status: Home / Self Care | Payer: 59 | Source: Ambulatory Visit | Attending: General Surgery | Admitting: General Surgery

## 2019-02-20 ENCOUNTER — Other Ambulatory Visit: Payer: Self-pay

## 2019-02-20 DIAGNOSIS — Z20828 Contact with and (suspected) exposure to other viral communicable diseases: Secondary | ICD-10-CM | POA: Diagnosis not present

## 2019-02-20 DIAGNOSIS — Z01812 Encounter for preprocedural laboratory examination: Secondary | ICD-10-CM | POA: Insufficient documentation

## 2019-02-20 NOTE — Patient Instructions (Addendum)
Your procedure is scheduled on: Wed. 10/14 Report to Day Surgery. To find out your arrival time please call [phone] between 1PM - 3PM on Tues 10/13.  Remember: Instructions that are not followed completely may result in serious medical risk,  up to and including death, or upon the discretion of your surgeon and anesthesiologist your  surgery may need to be rescheduled.     _X__ 1. Do not eat food after midnight the night before your procedure.                 No gum chewing or hard candies. You may drink clear liquids up to 2 hours                 before you are scheduled to arrive for your surgery- DO not drink clear                 liquids within 2 hours of the start of your surgery.                 Clear Liquids include:  water, apple juice without pulp, clear carbohydrate                 drink such as Clearfast of Gatorade, Black Coffee or Tea (Do not add                 anything to coffee or tea).  __X__2.  On the morning of surgery brush your teeth with toothpaste and water, you                may rinse your mouth with mouthwash if you wish.  Do not swallow any toothpaste of mouthwash.     _X__ 3.  No Alcohol for 24 hours before or after surgery.   ___ 4.  Do Not Smoke or use e-cigarettes For 24 Hours Prior to Your Surgery.                 Do not use any chewable tobacco products for at least 6 hours prior to                 surgery.  ____  5.  Bring all medications with you on the day of surgery if instructed.   __x__  6.  Notify your doctor if there is any change in your medical condition      (cold, fever, infections).     Do not wear jewelry, make-up, hairpins, clips or nail polish. Do not wear lotions, powders, or perfumes. You may wear deodorant. Do not shave 48 hours prior to surgery. Men may shave face and neck. Do not bring valuables to the hospital.    [facility] is not responsible for any belongings or valuables.  Contacts, dentures  or bridgework may not be worn into surgery. Leave your suitcase in the car. After surgery it may be brought to your room. For patients admitted to the hospital, discharge time is determined by your treatment team.   Patients discharged the day of surgery will not be allowed to drive home.   Please read over the following fact sheets that you were given:    __x__ Take these medicines the morning of surgery with A SIP OF WATER:    1. gabapentin (NEURONTIN) 600 MG tablet  2.   3.   4.  5.  6.  __x__ Fleet Enema (as directed) Night before and morning of surgery  __x__ Use CHG Soap as  directed  ____ Use inhalers on the day of surgery  ____ Stop metformin 2 days prior to surgery    ____ Take 1/2 of usual insulin dose the night before surgery. No insulin the morning          of surgery.   __x__ Stop ELIQUIS 5 MG TABS tablet Last dose on 10/11  ____ Stop Anti-inflammatories on   __x__ Stop supplements until after surgery.  ELDERBERRY PO  ____ Bring C-Pap to the hospital.     1 part rubbing alcohol and 2 parts water in zip lock (double bag) freeze .

## 2019-02-21 ENCOUNTER — Other Ambulatory Visit: Payer: Self-pay

## 2019-02-21 ENCOUNTER — Other Ambulatory Visit
Admission: RE | Admit: 2019-02-21 | Discharge: 2019-02-21 | Disposition: A | Discharge status: Home / Self Care | Payer: 59 | Source: Ambulatory Visit | Attending: General Surgery | Admitting: General Surgery

## 2019-02-21 DIAGNOSIS — Z01812 Encounter for preprocedural laboratory examination: Secondary | ICD-10-CM | POA: Insufficient documentation

## 2019-02-21 DIAGNOSIS — Z20828 Contact with and (suspected) exposure to other viral communicable diseases: Secondary | ICD-10-CM | POA: Diagnosis not present

## 2019-02-21 LAB — SARS CORONAVIRUS 2 (TAT 6-24 HRS): SARS Coronavirus 2: NEGATIVE

## 2019-02-26 ENCOUNTER — Encounter: Payer: Self-pay | Admitting: Anesthesiology

## 2019-02-26 ENCOUNTER — Ambulatory Visit
Admission: RE | Admit: 2019-02-26 | Discharge: 2019-02-26 | Disposition: A | Discharge status: Home / Self Care | Payer: 59 | Attending: General Surgery | Admitting: General Surgery

## 2019-02-26 ENCOUNTER — Ambulatory Visit: Payer: 59 | Admitting: Anesthesiology

## 2019-02-26 ENCOUNTER — Encounter
Admission: RE | Disposition: A | Discharge status: Home / Self Care | Payer: Self-pay | Source: Home / Self Care | Attending: General Surgery

## 2019-02-26 ENCOUNTER — Other Ambulatory Visit: Payer: Self-pay

## 2019-02-26 DIAGNOSIS — K641 Second degree hemorrhoids: Secondary | ICD-10-CM

## 2019-02-26 DIAGNOSIS — F329 Major depressive disorder, single episode, unspecified: Secondary | ICD-10-CM | POA: Insufficient documentation

## 2019-02-26 DIAGNOSIS — Z888 Allergy status to other drugs, medicaments and biological substances status: Secondary | ICD-10-CM | POA: Insufficient documentation

## 2019-02-26 DIAGNOSIS — R61 Generalized hyperhidrosis: Secondary | ICD-10-CM | POA: Insufficient documentation

## 2019-02-26 DIAGNOSIS — K644 Residual hemorrhoidal skin tags: Secondary | ICD-10-CM | POA: Diagnosis not present

## 2019-02-26 DIAGNOSIS — Z9889 Other specified postprocedural states: Secondary | ICD-10-CM

## 2019-02-26 DIAGNOSIS — Z801 Family history of malignant neoplasm of trachea, bronchus and lung: Secondary | ICD-10-CM | POA: Insufficient documentation

## 2019-02-26 DIAGNOSIS — K2950 Unspecified chronic gastritis without bleeding: Secondary | ICD-10-CM | POA: Insufficient documentation

## 2019-02-26 DIAGNOSIS — Z833 Family history of diabetes mellitus: Secondary | ICD-10-CM | POA: Insufficient documentation

## 2019-02-26 DIAGNOSIS — G479 Sleep disorder, unspecified: Secondary | ICD-10-CM | POA: Insufficient documentation

## 2019-02-26 DIAGNOSIS — Z8049 Family history of malignant neoplasm of other genital organs: Secondary | ICD-10-CM | POA: Diagnosis not present

## 2019-02-26 DIAGNOSIS — G43909 Migraine, unspecified, not intractable, without status migrainosus: Secondary | ICD-10-CM | POA: Diagnosis not present

## 2019-02-26 DIAGNOSIS — Z79899 Other long term (current) drug therapy: Secondary | ICD-10-CM | POA: Insufficient documentation

## 2019-02-26 DIAGNOSIS — K219 Gastro-esophageal reflux disease without esophagitis: Secondary | ICD-10-CM | POA: Insufficient documentation

## 2019-02-26 DIAGNOSIS — Z8249 Family history of ischemic heart disease and other diseases of the circulatory system: Secondary | ICD-10-CM | POA: Diagnosis not present

## 2019-02-26 DIAGNOSIS — Z90710 Acquired absence of both cervix and uterus: Secondary | ICD-10-CM | POA: Diagnosis not present

## 2019-02-26 DIAGNOSIS — Z825 Family history of asthma and other chronic lower respiratory diseases: Secondary | ICD-10-CM | POA: Insufficient documentation

## 2019-02-26 DIAGNOSIS — Z86718 Personal history of other venous thrombosis and embolism: Secondary | ICD-10-CM | POA: Diagnosis not present

## 2019-02-26 DIAGNOSIS — Z87891 Personal history of nicotine dependence: Secondary | ICD-10-CM | POA: Insufficient documentation

## 2019-02-26 DIAGNOSIS — Z7901 Long term (current) use of anticoagulants: Secondary | ICD-10-CM | POA: Insufficient documentation

## 2019-02-26 DIAGNOSIS — Z8719 Personal history of other diseases of the digestive system: Secondary | ICD-10-CM | POA: Insufficient documentation

## 2019-02-26 DIAGNOSIS — D6859 Other primary thrombophilia: Secondary | ICD-10-CM | POA: Insufficient documentation

## 2019-02-26 DIAGNOSIS — Z8489 Family history of other specified conditions: Secondary | ICD-10-CM | POA: Diagnosis not present

## 2019-02-26 DIAGNOSIS — Z9049 Acquired absence of other specified parts of digestive tract: Secondary | ICD-10-CM | POA: Insufficient documentation

## 2019-02-26 DIAGNOSIS — Z87442 Personal history of urinary calculi: Secondary | ICD-10-CM | POA: Diagnosis not present

## 2019-02-26 DIAGNOSIS — D649 Anemia, unspecified: Secondary | ICD-10-CM | POA: Diagnosis not present

## 2019-02-26 DIAGNOSIS — Z791 Long term (current) use of non-steroidal anti-inflammatories (NSAID): Secondary | ICD-10-CM | POA: Insufficient documentation

## 2019-02-26 HISTORY — PX: RECTAL EXAM UNDER ANESTHESIA: SHX6399

## 2019-02-26 LAB — URINE DRUG SCREEN, QUALITATIVE (ARMC ONLY)
Amphetamines, Ur Screen: NOT DETECTED
Barbiturates, Ur Screen: NOT DETECTED
Benzodiazepine, Ur Scrn: NOT DETECTED
Cannabinoid 50 Ng, Ur ~~LOC~~: POSITIVE — AB
Cocaine Metabolite,Ur ~~LOC~~: NOT DETECTED
MDMA (Ecstasy)Ur Screen: NOT DETECTED
Methadone Scn, Ur: NOT DETECTED
Opiate, Ur Screen: NOT DETECTED
Phencyclidine (PCP) Ur S: NOT DETECTED
Tricyclic, Ur Screen: NOT DETECTED

## 2019-02-26 SURGERY — EXAM UNDER ANESTHESIA, RECTUM
Anesthesia: General | Site: Rectum

## 2019-02-26 MED ORDER — LIDOCAINE HCL (CARDIAC) PF 100 MG/5ML IV SOSY
PREFILLED_SYRINGE | INTRAVENOUS | Status: DC | PRN
  Administered 2019-02-26: 100 mg via INTRAVENOUS

## 2019-02-26 MED ORDER — MIDAZOLAM HCL 2 MG/2ML IJ SOLN
INTRAMUSCULAR | Status: AC
  Filled 2019-02-26: qty 2

## 2019-02-26 MED ORDER — BUPIVACAINE HCL (PF) 0.25 % IJ SOLN
INTRAMUSCULAR | Status: AC
  Filled 2019-02-26: qty 30

## 2019-02-26 MED ORDER — BUPIVACAINE LIPOSOME 1.3 % IJ SUSP
INTRAMUSCULAR | Status: AC
  Filled 2019-02-26: qty 20

## 2019-02-26 MED ORDER — OXYCODONE HCL 5 MG PO TABS: 5.0000 mg | ORAL_TABLET | Freq: Four times a day (QID) | ORAL | 0 refills | Status: DC | PRN

## 2019-02-26 MED ORDER — DEXMEDETOMIDINE HCL 200 MCG/2ML IV SOLN
INTRAVENOUS | Status: DC | PRN
  Administered 2019-02-26: 12 ug via INTRAVENOUS

## 2019-02-26 MED ORDER — CHLORHEXIDINE GLUCONATE CLOTH 2 % EX PADS: 6.0000 | MEDICATED_PAD | Freq: Once | CUTANEOUS | Status: DC

## 2019-02-26 MED ORDER — BUPIVACAINE LIPOSOME 1.3 % IJ SUSP
INTRAMUSCULAR | Status: DC | PRN
  Administered 2019-02-26: 10 mL

## 2019-02-26 MED ORDER — MEPERIDINE HCL 50 MG/ML IJ SOLN
INTRAMUSCULAR | Status: AC
  Filled 2019-02-26: qty 1

## 2019-02-26 MED ORDER — PROMETHAZINE HCL 25 MG/ML IJ SOLN: 6.2500 mg | INTRAMUSCULAR | Status: DC | PRN

## 2019-02-26 MED ORDER — IBUPROFEN 800 MG PO TABS: 800.0000 mg | ORAL_TABLET | Freq: Three times a day (TID) | ORAL | 0 refills | Status: DC | PRN

## 2019-02-26 MED ORDER — PROPOFOL 10 MG/ML IV BOLUS
INTRAVENOUS | Status: AC
  Filled 2019-02-26: qty 20

## 2019-02-26 MED ORDER — FENTANYL CITRATE (PF) 100 MCG/2ML IJ SOLN
INTRAMUSCULAR | Status: AC
  Filled 2019-02-26: qty 2

## 2019-02-26 MED ORDER — SUGAMMADEX SODIUM 200 MG/2ML IV SOLN
INTRAVENOUS | Status: DC | PRN
  Administered 2019-02-26: 300 mg via INTRAVENOUS

## 2019-02-26 MED ORDER — BUPIVACAINE LIPOSOME 1.3 % IJ SUSP: 20.0000 mL | Freq: Once | INTRAMUSCULAR | Status: DC

## 2019-02-26 MED ORDER — MEPERIDINE HCL 50 MG/ML IJ SOLN
6.2500 mg | INTRAMUSCULAR | Status: DC | PRN
  Administered 2019-02-26: 6.25 mg via INTRAVENOUS

## 2019-02-26 MED ORDER — DOCUSATE SODIUM 100 MG PO CAPS: 100.0000 mg | ORAL_CAPSULE | Freq: Two times a day (BID) | ORAL | 0 refills | Status: AC

## 2019-02-26 MED ORDER — MIDAZOLAM HCL 2 MG/2ML IJ SOLN
INTRAMUSCULAR | Status: DC | PRN
  Administered 2019-02-26: 2 mg via INTRAVENOUS

## 2019-02-26 MED ORDER — OXYCODONE HCL 5 MG PO TABS: 5.0000 mg | ORAL_TABLET | Freq: Once | ORAL | Status: DC | PRN

## 2019-02-26 MED ORDER — DEXAMETHASONE SODIUM PHOSPHATE 10 MG/ML IJ SOLN
INTRAMUSCULAR | Status: DC | PRN
  Administered 2019-02-26: 10 mg via INTRAVENOUS

## 2019-02-26 MED ORDER — BUPIVACAINE HCL (PF) 0.25 % IJ SOLN
INTRAMUSCULAR | Status: DC | PRN
  Administered 2019-02-26: 10 mL

## 2019-02-26 MED ORDER — GELATIN ABSORBABLE 12-7 MM EX MISC
CUTANEOUS | Status: AC
  Filled 2019-02-26: qty 1

## 2019-02-26 MED ORDER — THROMBIN 5000 UNITS EX SOLR
CUTANEOUS | Status: AC
  Filled 2019-02-26: qty 5000

## 2019-02-26 MED ORDER — LACTATED RINGERS IV SOLN
INTRAVENOUS | Status: DC | PRN
  Administered 2019-02-26: 09:00:00 via INTRAVENOUS

## 2019-02-26 MED ORDER — FAMOTIDINE 20 MG PO TABS
20.0000 mg | ORAL_TABLET | Freq: Once | ORAL | Status: AC
  Administered 2019-02-26: 20 mg via ORAL

## 2019-02-26 MED ORDER — PROPOFOL 10 MG/ML IV BOLUS
INTRAVENOUS | Status: DC | PRN
  Administered 2019-02-26: 160 mg via INTRAVENOUS

## 2019-02-26 MED ORDER — ROCURONIUM BROMIDE 100 MG/10ML IV SOLN
INTRAVENOUS | Status: DC | PRN
  Administered 2019-02-26: 40 mg via INTRAVENOUS

## 2019-02-26 MED ORDER — ACETAMINOPHEN 500 MG PO TABS
ORAL_TABLET | ORAL | Status: AC
  Filled 2019-02-26: qty 2

## 2019-02-26 MED ORDER — FENTANYL CITRATE (PF) 100 MCG/2ML IJ SOLN
INTRAMUSCULAR | Status: DC | PRN
  Administered 2019-02-26 (×2): 50 ug via INTRAVENOUS

## 2019-02-26 MED ORDER — FENTANYL CITRATE (PF) 100 MCG/2ML IJ SOLN: 25.0000 ug | INTRAMUSCULAR | Status: DC | PRN

## 2019-02-26 MED ORDER — LACTATED RINGERS IV SOLN
INTRAVENOUS | Status: DC
  Administered 2019-02-26: 08:00:00 via INTRAVENOUS

## 2019-02-26 MED ORDER — KETOROLAC TROMETHAMINE 30 MG/ML IJ SOLN
INTRAMUSCULAR | Status: DC | PRN
  Administered 2019-02-26: 30 mg via INTRAVENOUS

## 2019-02-26 MED ORDER — ACETAMINOPHEN 500 MG PO TABS
1000.0000 mg | ORAL_TABLET | ORAL | Status: AC
  Administered 2019-02-26: 1000 mg via ORAL

## 2019-02-26 MED ORDER — CHLORHEXIDINE GLUCONATE CLOTH 2 % EX PADS
6.0000 | MEDICATED_PAD | Freq: Once | CUTANEOUS | Status: AC
  Administered 2019-02-26: 6 via TOPICAL

## 2019-02-26 MED ORDER — OXYCODONE HCL 5 MG/5ML PO SOLN: 5.0000 mg | Freq: Once | ORAL | Status: DC | PRN

## 2019-02-26 MED ORDER — ONDANSETRON HCL 4 MG/2ML IJ SOLN
INTRAMUSCULAR | Status: DC | PRN
  Administered 2019-02-26: 4 mg via INTRAVENOUS

## 2019-02-26 MED ORDER — FAMOTIDINE 20 MG PO TABS
ORAL_TABLET | ORAL | Status: AC
  Filled 2019-02-26: qty 1

## 2019-02-26 SURGICAL SUPPLY — 27 items
CANISTER SUCT 1200ML W/VALVE (MISCELLANEOUS) ×2 IMPLANT
COVER WAND RF STERILE (DRAPES) IMPLANT
DRAPE LAPAROTOMY 77X122 PED (DRAPES) ×2 IMPLANT
DRSG GAUZE FLUFF 36X18 (GAUZE/BANDAGES/DRESSINGS) ×2 IMPLANT
ELECT CAUTERY BLADE TIP 2.5 (TIP) ×2
ELECT REM PT RETURN 9FT ADLT (ELECTROSURGICAL) ×2
ELECTRODE CAUTERY BLDE TIP 2.5 (TIP) ×1 IMPLANT
ELECTRODE REM PT RTRN 9FT ADLT (ELECTROSURGICAL) ×1 IMPLANT
GLOVE BIO SURGEON STRL SZ 6.5 (GLOVE) ×2 IMPLANT
GLOVE INDICATOR 7.0 STRL GRN (GLOVE) ×2 IMPLANT
GOWN STRL REUS W/ TWL LRG LVL3 (GOWN DISPOSABLE) ×2 IMPLANT
GOWN STRL REUS W/TWL LRG LVL3 (GOWN DISPOSABLE) ×4
KIT TURNOVER KIT A (KITS) ×2 IMPLANT
LABEL OR SOLS (LABEL) ×2 IMPLANT
NEEDLE HYPO 22GX1.5 SAFETY (NEEDLE) ×2 IMPLANT
PACK BASIN MINOR ARMC (MISCELLANEOUS) ×2 IMPLANT
PAD PREP 24X41 OB/GYN DISP (PERSONAL CARE ITEMS) ×2 IMPLANT
SHEARS HARMONIC 9CM CVD (BLADE) ×2 IMPLANT
SOL PREP PVP 2OZ (MISCELLANEOUS) ×2
SOLUTION PREP PVP 2OZ (MISCELLANEOUS) ×1 IMPLANT
SPONGE LAP 18X18 RF (DISPOSABLE) ×2 IMPLANT
SURGILUBE 2OZ TUBE FLIPTOP (MISCELLANEOUS) ×2 IMPLANT
SUT SILK 0 CT 1 30 (SUTURE) ×2 IMPLANT
SUT VIC AB 3-0 SH 27 (SUTURE)
SUT VIC AB 3-0 SH 27X BRD (SUTURE) IMPLANT
SYR 20ML LL LF (SYRINGE) ×2 IMPLANT
SYR BULB IRRIG 60ML STRL (SYRINGE) ×2 IMPLANT

## 2019-02-26 NOTE — Discharge Instructions (Signed)

## 2019-02-26 NOTE — Anesthesia Preprocedure Evaluation (Signed)
Anesthesia Evaluation  Patient identified by MRN, date of birth, ID band Patient awake    Reviewed: Allergy & Precautions, NPO status , Patient's Chart, lab work & pertinent test results  History of Anesthesia Complications Negative for: history of anesthetic complications  Airway Mallampati: I  TM Distance: >3 FB Neck ROM: Full    Dental no notable dental hx.    Pulmonary neg sleep apnea, neg COPD, former smoker,    breath sounds clear to auscultation- rhonchi (-) wheezing      Cardiovascular Exercise Tolerance: Good (-) hypertension(-) CAD, (-) Past MI, (-) Cardiac Stents and (-) CABG  Rhythm:Regular Rate:Normal - Systolic murmurs and - Diastolic murmurs    Neuro/Psych  Headaches, neg Seizures PSYCHIATRIC DISORDERS Depression    GI/Hepatic Neg liver ROS, GERD  ,  Endo/Other  negative endocrine ROSneg diabetes  Renal/GU negative Renal ROS     Musculoskeletal negative musculoskeletal ROS (+)   Abdominal (+) - obese,   Peds  Hematology  (+) anemia ,   Anesthesia Other Findings Past Medical History: No date: Anemia     Comment:  h/o No date: Cervical high risk human papillomavirus (HPV) DNA test  positive No date: Chronic gastritis No date: Depression 09/2016: DVT (deep venous thrombosis) (HCC)     Comment:  left leg No date: GERD (gastroesophageal reflux disease) No date: H. pylori infection No date: History of attempted suicide No date: History of gastric polyp No date: History of kidney stones     Comment:  h/o No date: Hyperhidrosis No date: Migraines No date: Protein C deficiency (HCC) No date: Rash No date: Shift work sleep disorder   Reproductive/Obstetrics                             Anesthesia Physical Anesthesia Plan  ASA: II  Anesthesia Plan: General   Post-op Pain Management:    Induction: Intravenous  PONV Risk Score and Plan: 2 and Ondansetron,  Dexamethasone and Midazolam  Airway Management Planned: Oral ETT  Additional Equipment:   Intra-op Plan:   Post-operative Plan: Extubation in OR  Informed Consent: I have reviewed the patients History and Physical, chart, labs and discussed the procedure including the risks, benefits and alternatives for the proposed anesthesia with the patient or authorized representative who has indicated his/her understanding and acceptance.     Dental advisory given  Plan Discussed with: CRNA and Anesthesiologist  Anesthesia Plan Comments:         Anesthesia Quick Evaluation

## 2019-02-26 NOTE — Transfer of Care (Signed)
Immediate Anesthesia Transfer of Care Note  Patient: Tammy Garza  Procedure(s) Performed: RECTAL EXAM UNDER ANESTHESIA, EXCISION OF ANAL SKIN TAG (N/A Rectum)  Patient Location: PACU  Anesthesia Type:General  Level of Consciousness: sedated  Airway & Oxygen Therapy: Patient Spontanous Breathing and Patient connected to face mask oxygen  Post-op Assessment: Report given to RN and Post -op Vital signs reviewed and stable  Post vital signs: Reviewed and stable  Last Vitals:  Vitals Value Taken Time  BP 128/79 02/26/19 0953  Temp    Pulse    Resp 13 02/26/19 0955  SpO2    Vitals shown include unvalidated device data.  Last Pain:  Vitals:   02/26/19 0726  TempSrc:   PainSc: 0-No pain         Complications: No apparent anesthesia complications

## 2019-02-26 NOTE — Anesthesia Postprocedure Evaluation (Signed)
Anesthesia Post Note  Patient: Tammy Garza  Procedure(s) Performed: RECTAL EXAM UNDER ANESTHESIA, EXCISION OF ANAL SKIN TAG (N/A Rectum)  Patient location during evaluation: PACU Anesthesia Type: General Level of consciousness: awake and alert and oriented Pain management: pain level controlled Vital Signs Assessment: post-procedure vital signs reviewed and stable Respiratory status: spontaneous breathing, nonlabored ventilation and respiratory function stable Cardiovascular status: blood pressure returned to baseline and stable Postop Assessment: no signs of nausea or vomiting Anesthetic complications: no     Last Vitals:  Vitals:   02/26/19 1037 02/26/19 1052  BP:  126/73  Pulse: (!) 57 (!) 55  Resp:  17  Temp: 36.6 C 36.6 C  SpO2:  100%    Last Pain:  Vitals:   02/26/19 1052  TempSrc: Tympanic  PainSc: 0-No pain                 Christobal Morado

## 2019-02-26 NOTE — Anesthesia Procedure Notes (Signed)
Procedure Name: Intubation Date/Time: 02/26/2019 8:53 AM Performed by: Justus Memory, CRNA Pre-anesthesia Checklist: Patient identified, Patient being monitored, Timeout performed, Emergency Drugs available and Suction available Patient Re-evaluated:Patient Re-evaluated prior to induction Oxygen Delivery Method: Circle system utilized Preoxygenation: Pre-oxygenation with 100% oxygen Induction Type: IV induction Ventilation: Mask ventilation without difficulty Laryngoscope Size: Mac, 3 and McGraph Grade View: Grade I Tube type: Oral Tube size: 7.0 mm Number of attempts: 1 Airway Equipment and Method: Stylet and Video-laryngoscopy (elective use of video scope) Placement Confirmation: ETT inserted through vocal cords under direct vision,  positive ETCO2 and breath sounds checked- equal and bilateral Secured at: 21 cm Tube secured with: Tape Dental Injury: Teeth and Oropharynx as per pre-operative assessment

## 2019-02-26 NOTE — H&P (Signed)
HPI Tammy Garza is a 38 y.o. female.   She has been referred for surgical evaluation of anal skin tags.  She states that they have been present for at least a year.  She does have a history of hemorrhoids.  She recently underwent hemorrhoidal banding of multiple internal hemorrhoidal pillars by Dr. Marius Ditch.  She is interested in surgical removal of perianal skin tags.  She says that she finds the area difficult to keep clean and they sometimes become irritated and itchy.  There is occasional bleeding at the base of the tags when they become irritated.  She denies constipation.  She denies any pain or bleeding with bowel movements.  She says that she is unable to wear thong-style lingerie due to pain and irritation of the skin tags.  Of clinical relevance, she has a history of left lower extremity DVT secondary to protein C deficiency and is on lifelong anticoagulation.  She is taking Eliquis and this is being managed by Dr. Mike Gip in hematology oncology.       Past Medical History:  Diagnosis Date  . Anemia    h/o  . Cervical high risk human papillomavirus (HPV) DNA test positive   . Chronic gastritis   . Depression   . DVT (deep venous thrombosis) ([place]) 09/2016   left leg  . GERD (gastroesophageal reflux disease)   . H. pylori infection   . History of attempted suicide   . History of gastric polyp   . History of kidney stones    h/o  . Hyperhidrosis   . Migraines   . Protein C deficiency ([place])   . Rash   . Shift work sleep disorder          Past Surgical History:  Procedure Laterality Date  . ABDOMINAL HYSTERECTOMY    . ANTERIOR CRUCIATE LIGAMENT REPAIR Left   . CHOLECYSTECTOMY    . CYSTOSCOPY  12/19/2016   Procedure: CYSTOSCOPY;  Surgeon: Gae Dry, MD;  Location: [facility] ORS;  Service: Gynecology;;  . ESOPHAGOGASTRODUODENOSCOPY (EGD) WITH PROPOFOL N/A 09/25/2017   Procedure: ESOPHAGOGASTRODUODENOSCOPY (EGD) WITH PROPOFOL;  Surgeon: Jonathon Bellows, MD;  Location: [facility] ENDOSCOPY;  Service: Gastroenterology;  Laterality: N/A;  . LAPAROSCOPIC HYSTERECTOMY Bilateral 12/19/2016   Procedure: HYSTERECTOMY TOTAL LAPAROSCOPIC BILATERAL SALPINGECTOMY;  Surgeon: Gae Dry, MD;  Location: [facility] ORS;  Service: Gynecology;  Laterality: Bilateral;  . TUBAL LIGATION           Family History  Problem Relation Age of Onset  . HIV Sister   . Cancer Sister 15       cervical cancer  . Asthma Daughter   . Protein C deficiency Daughter   . Hypertension Maternal Grandmother   . Hypertension Mother   . Obesity Mother   . Cancer Father 10       lung cancer  . Deep vein thrombosis Father   . Protein C deficiency Father   . COPD Father   . Congestive Heart Failure Father   . Diabetes Brother   . COPD Paternal Grandmother   . Deep vein thrombosis Paternal Grandmother   . Protein C deficiency Daughter     Social History Social History        Tobacco Use  . Smoking status: Former Smoker    Packs/day: 0.25    Years: 5.00    Pack years: 1.25    Types: Cigarettes    Start date: 05/15/2002    Quit date: 05/16/2007    Years since quitting:  11.5  . Smokeless tobacco: Never Used  Substance Use Topics  . Alcohol use: Yes    Alcohol/week: 1.0 standard drinks    Types: 1 Glasses of wine per week    Comment: occ wine none last 24 hrs  . Drug use: No         Allergies  Allergen Reactions  . Topamax [Topiramate]     Confusion , slurred speech           Current Outpatient Medications  Medication Sig Dispense Refill  . baclofen (LIORESAL) 10 MG tablet TAKE 1 TABLET BY MOUTH TWICE A DAY AS NEEDED (LIMIT 1 2 DAYS PER WEEK)    . chlorproMAZINE (THORAZINE) 25 MG tablet TAKE 1 TO 2 TABLETS AS NEEDED FOR HEADACHE RESCUE. MAY REPEAT EVERY 2 3 HOURS (MAX4/DAY)    . clindamycin (CLEOCIN T) 1 % lotion Apply 1 g topically daily. On face     . diclofenac sodium (VOLTAREN) 1 % GEL Apply 2 g  topically 4 (four) times daily. 100 g 1  . DRYSOL 20 % external solution Apply 1 g topically daily.    Marland Kitchen ELIQUIS 5 MG TABS tablet TAKE 1 TABLET BY MOUTH TWICE A DAY 60 tablet 1  . gabapentin (NEURONTIN) 600 MG tablet Take 600 mg by mouth 2 (two) times daily.   5  . glycopyrrolate (ROBINUL) 1 MG tablet Take 3 mg by mouth daily.  2  . tretinoin (RETIN-A) 0.025 % cream Apply 1 g topically at bedtime.     No current facility-administered medications for this visit.     Review of Systems Review of Systems  Cardiovascular: Positive for leg swelling.       Secondary to left lower extremity DVT  Gastrointestinal:       GERD  Hematological:       Clotting disorder secondary to protein C deficiency, taking Eliquis  All other systems reviewed and are negative.   Vitals:   02/26/19 0706  BP: 114/82  Pulse: 62  Resp: 16  Temp: 98 F (36.7 C)  SpO2: 100%     Physical Exam Physical Exam Constitutional:      General: She is not in acute distress.    Appearance: Normal appearance.  HENT:     Head: Normocephalic and atraumatic.     Nose:     Comments: Covered with a mask secondary to COVID-19 precautions    Mouth/Throat:     Comments: Covered with a mask secondary to COVID-19 precautions Eyes:     General: No scleral icterus.       Right eye: No discharge.        Left eye: No discharge.     Extraocular Movements: Extraocular movements intact.  Cardiovascular:     Rate and Rhythm: Regular rhythm.     Pulses: Normal pulses.     Heart sounds: Normal heart sounds.  Pulmonary:     Effort: Pulmonary effort is normal.     Breath sounds: Normal breath sounds.  Abdominal:     General: Bowel sounds are normal.     Palpations: Abdomen is soft.  Genitourinary:    Exam position: Knee-chest position.       Comments: Skin tags in the perianal skin, consistent with prior episodes of hemorrhoids.  There are no stigmata of bleeding. Musculoskeletal: Normal range of motion.      Left lower leg: Edema present.     Comments: Trace, nonpitting  Lymphadenopathy:     Cervical: No cervical adenopathy.  Skin:    General: Skin is warm and dry.  Neurological:     General: No focal deficit present.     Mental Status: She is alert and oriented to person, place, and time.  Psychiatric:        Mood and Affect: Mood normal.        Behavior: Behavior normal.        Thought Content: Thought content normal.     Data Reviewed I reviewed the reports from Dr. Marius Ditch, describing the banding of multiple internal hemorrhoidal pillars performed on March 17, June 2, and November 05, 2018.  These describe symptomatic grade 2 hemorrhoids successfully banded in her clinic.  Assessment This is a 38 year old woman who has a history of hemorrhoid disease.  She has had banding of her symptomatic internal hemorrhoids.  She has external skin tags as sequelae of prior episodes of hemorrhoids.  She is interested in surgical removal due to irritation and difficulty with hygiene.  Plan I discussed the procedure with her today.  This would be performed under anesthesia in the operating room.  She will need to hold her Eliquis for several days prior to the operation as well as several days afterwards.  We will request medical clearance and recommendations for this.  We discussed the risks of bleeding, infection, and pain.  I also discussed that we typically just leave these alone, however due to her discomfort with them, I have agreed to resect them.  She accepts the risks and wishes to proceed.  She has been off of Eliquis for 2 days; will plan to resume 2 days after surgery.  No significant changes to medical history. Will proceed as above.

## 2019-02-26 NOTE — Anesthesia Post-op Follow-up Note (Signed)
Anesthesia QCDR form completed.        

## 2019-02-26 NOTE — Op Note (Signed)
Operative Note  Preoperative Diagnosis: Anal skin tag x 2  Postoperative Diagnosis: Same, plus internal and external grade 2 hemorrhoids  Operation: Examination under anesthesia (diagnostic anoscopy), excision of anal skin tag x2, excision of 2 columns internal and external grade 2 hemorrhoids.  Surgeon: Fredirick Maudlin, MD  Assistant: None  Anesthesia: General endotracheal  Findings: There were 2 grossly visible external skin tags present.  Upon diagnostic anoscopy, these tags were associated with grade 2 internal and external hemorrhoids.  There was no stigmata of recent bleeding, however both columns were irritated-appearing.  Indications: Tammy Garza is a 38 year old woman who has a history of hemorrhoids.  She now has external anal papillae that are irritating and interfere with both hygiene and clothing.  She desired surgical resection.  She does take Eliquis for clotting disorder, and this was discontinued for 2 days prior to her procedure.  The risks of surgery were discussed with her and she agreed to proceed.  Procedure In Detail: The patient was identified in the preoperative holding area and then brought to the operating room.  She was intubated on the stretcher and then turned into the prone position on the OR table.  Her body was supported on a purpose made gel pad.  Bony prominences were appropriately padded.  Bilateral sequential compression devices were in place.  Her arms were supported on the arm boards.  The table was flexed in the prone jackknife position and the buttocks taped laterally to provide exposure.  She was then sterilely prepped and draped in standard fashion.  A timeout was performed confirming the patient's identity, the procedure being performed, her allergies, all necessary equipment was available, and that maintenance anesthesia was adequate.  A lubricated Hill-Ferguson retractor was inserted into the anal canal.  Associated with each of the skin tags was a  column of grade 2 internal and external hemorrhoids.  I determined that it would be appropriate to resect these along with their associated skin tags.  The skin surrounding the 2 external skin tags was infiltrated generously with 0.25% bupivacaine.  I grasped the skin tag on the right with an Allis clamp.  The harmonic scalpel was used to divide the skin surrounding the skin tag.  The incision was carried down in the submucosal layer, taking care to avoid violation of the anal sphincter muscles.  I continued with the harmonic scalpel until I reached the base of the column of dilated veins.  The column of hemorrhoids was transected and handed off with the accompanying skin tag.  I repeated this procedure with the right lateral column.  Hemostasis was obtained with electrocautery.  I irrigated thoroughly and then injected 20 cc of undiluted liposomal bupivacaine around the surgical site.  Gauze fluffs and mesh underpants were used as a dressing.  The patient was then turned supine onto the stretcher.  She was safely awakened, extubated, and taken to the postanesthesia care unit in good condition.  EBL: LESS than 5 cc  IVF: 600 cc of crystalloid  Specimen(s): Anal skin tag x2 and associated hemorrhoidal columns with each.  Complications: none immediately apparent.   Counts: all needles, instruments, and sponges were counted and reported to be correct in number at the end of the case.   I was present for and participated in the entire operation.  Fredirick Maudlin 9:59 AM

## 2019-02-27 LAB — SURGICAL PATHOLOGY

## 2019-03-03 ENCOUNTER — Telehealth: Payer: Self-pay | Admitting: *Deleted

## 2019-03-03 NOTE — Telephone Encounter (Signed)
Patient called she had  Anal skin tag removal on 02/26/19 by [facility], she has had 2 bowel movements today and they hurt so bad she sweats and about passes out, she is taking the stool softer. She stated that she is also having some drainage/pus like and blood coming from the rectum. Please call and advise.

## 2019-03-03 NOTE — Telephone Encounter (Signed)
Patient states when she has a bowel movement the pain intensifies.  She stated she is taking two stool softners daily, and her stools are very soft and watery at times. Patient was instructed to try one stool softner daily to see if this will help with bulking the stool, however avoid constipation. She was instructed do sitz baths multiple times throughout the day especially after each bowel movement. Suggested she could try Tylenol and non aspirin product to help with the pain or she could continue the narcotic. She will call office tomorrow if no better.

## 2019-03-05 ENCOUNTER — Encounter: Payer: Self-pay | Admitting: Emergency Medicine

## 2019-03-05 ENCOUNTER — Other Ambulatory Visit: Payer: Self-pay

## 2019-03-05 DIAGNOSIS — D6859 Other primary thrombophilia: Secondary | ICD-10-CM | POA: Diagnosis not present

## 2019-03-05 DIAGNOSIS — G479 Sleep disorder, unspecified: Secondary | ICD-10-CM | POA: Diagnosis not present

## 2019-03-05 DIAGNOSIS — Z20828 Contact with and (suspected) exposure to other viral communicable diseases: Secondary | ICD-10-CM | POA: Diagnosis not present

## 2019-03-05 DIAGNOSIS — Z87891 Personal history of nicotine dependence: Secondary | ICD-10-CM | POA: Insufficient documentation

## 2019-03-05 DIAGNOSIS — E559 Vitamin D deficiency, unspecified: Secondary | ICD-10-CM | POA: Insufficient documentation

## 2019-03-05 DIAGNOSIS — N99821 Postprocedural hemorrhage and hematoma of a genitourinary system organ or structure following other procedure: Principal | ICD-10-CM | POA: Insufficient documentation

## 2019-03-05 DIAGNOSIS — Z8249 Family history of ischemic heart disease and other diseases of the circulatory system: Secondary | ICD-10-CM | POA: Insufficient documentation

## 2019-03-05 DIAGNOSIS — T8130XA Disruption of wound, unspecified, initial encounter: Secondary | ICD-10-CM | POA: Diagnosis present

## 2019-03-05 DIAGNOSIS — K2950 Unspecified chronic gastritis without bleeding: Secondary | ICD-10-CM | POA: Insufficient documentation

## 2019-03-05 DIAGNOSIS — Z86718 Personal history of other venous thrombosis and embolism: Secondary | ICD-10-CM | POA: Insufficient documentation

## 2019-03-05 DIAGNOSIS — Z79899 Other long term (current) drug therapy: Secondary | ICD-10-CM | POA: Diagnosis not present

## 2019-03-05 DIAGNOSIS — K219 Gastro-esophageal reflux disease without esophagitis: Secondary | ICD-10-CM | POA: Diagnosis not present

## 2019-03-05 DIAGNOSIS — Z7901 Long term (current) use of anticoagulants: Secondary | ICD-10-CM | POA: Insufficient documentation

## 2019-03-05 DIAGNOSIS — Z87442 Personal history of urinary calculi: Secondary | ICD-10-CM | POA: Insufficient documentation

## 2019-03-05 NOTE — ED Triage Notes (Signed)
Pt had skin tag and hemroids removed from outside of anus on 10/14 and tonight pt started to have bleeding from outside of anus. Pt reported bleeding started at approx 2230.

## 2019-03-06 ENCOUNTER — Observation Stay
Admission: EM | Admit: 2019-03-06 | Discharge: 2019-03-07 | Disposition: A | Discharge status: Home / Self Care | Payer: 59 | Attending: General Surgery | Admitting: General Surgery

## 2019-03-06 DIAGNOSIS — N99821 Postprocedural hemorrhage and hematoma of a genitourinary system organ or structure following other procedure: Secondary | ICD-10-CM

## 2019-03-06 DIAGNOSIS — T8130XA Disruption of wound, unspecified, initial encounter: Secondary | ICD-10-CM

## 2019-03-06 DIAGNOSIS — T148XXA Other injury of unspecified body region, initial encounter: Secondary | ICD-10-CM | POA: Diagnosis present

## 2019-03-06 LAB — SARS CORONAVIRUS 2 (TAT 6-24 HRS): SARS Coronavirus 2: NEGATIVE

## 2019-03-06 MED ORDER — DICLOFENAC SODIUM 1 % TD GEL: 2.0000 g | Freq: Four times a day (QID) | TRANSDERMAL | Status: DC

## 2019-03-06 MED ORDER — CLINDAMYCIN PHOSPHATE 1 % EX LOTN: 1.0000 g | TOPICAL_LOTION | Freq: Every day | CUTANEOUS | Status: DC

## 2019-03-06 MED ORDER — LACTATED RINGERS IV SOLN
INTRAVENOUS | Status: DC
  Administered 2019-03-06 (×2): via INTRAVENOUS

## 2019-03-06 MED ORDER — CHLORPROMAZINE HCL 25 MG PO TABS
25.0000 mg | ORAL_TABLET | ORAL | Status: DC | PRN
  Administered 2019-03-06: 25 mg via ORAL
  Filled 2019-03-06 (×2): qty 1

## 2019-03-06 MED ORDER — SILVER NITRATE-POT NITRATE 75-25 % EX MISC
3.0000 | CUTANEOUS | Status: DC | PRN
  Filled 2019-03-06 (×4): qty 3

## 2019-03-06 MED ORDER — ELDERBERRY 575 MG/5ML PO SYRP: ORAL_SOLUTION | Freq: Every day | ORAL | Status: DC

## 2019-03-06 MED ORDER — OXYCODONE HCL 5 MG PO TABS
5.0000 mg | ORAL_TABLET | Freq: Four times a day (QID) | ORAL | Status: DC | PRN
  Administered 2019-03-06 – 2019-03-07 (×3): 5 mg via ORAL
  Filled 2019-03-06 (×3): qty 1

## 2019-03-06 MED ORDER — GLYCOPYRROLATE 1 MG PO TABS
3.0000 mg | ORAL_TABLET | Freq: Every day | ORAL | Status: DC
  Administered 2019-03-06 – 2019-03-07 (×2): 3 mg via ORAL
  Filled 2019-03-06 (×2): qty 3

## 2019-03-06 MED ORDER — DOCUSATE SODIUM 100 MG PO CAPS: 100.0000 mg | ORAL_CAPSULE | Freq: Every day | ORAL | Status: DC | PRN

## 2019-03-06 MED ORDER — LIDOCAINE-PRILOCAINE 2.5-2.5 % EX CREA
TOPICAL_CREAM | Freq: Four times a day (QID) | CUTANEOUS | Status: DC | PRN
  Administered 2019-03-06: 1 via TOPICAL
  Filled 2019-03-06: qty 5

## 2019-03-06 MED ORDER — ONDANSETRON 4 MG PO TBDP: 4.0000 mg | ORAL_TABLET | Freq: Four times a day (QID) | ORAL | Status: DC | PRN

## 2019-03-06 MED ORDER — CALCIUM CARBONATE-VITAMIN D 500-200 MG-UNIT PO TABS
2.0000 | ORAL_TABLET | Freq: Every day | ORAL | Status: DC
  Administered 2019-03-06 – 2019-03-07 (×2): 2 via ORAL
  Filled 2019-03-06 (×2): qty 2

## 2019-03-06 MED ORDER — BACLOFEN 10 MG PO TABS
10.0000 mg | ORAL_TABLET | Freq: Two times a day (BID) | ORAL | Status: DC | PRN
  Administered 2019-03-07: 10 mg via ORAL
  Filled 2019-03-06 (×2): qty 1

## 2019-03-06 MED ORDER — ACETAMINOPHEN 500 MG PO TABS
1000.0000 mg | ORAL_TABLET | Freq: Four times a day (QID) | ORAL | Status: DC | PRN
  Administered 2019-03-06 (×2): 1000 mg via ORAL
  Filled 2019-03-06 (×2): qty 2

## 2019-03-06 MED ORDER — GABAPENTIN 600 MG PO TABS
600.0000 mg | ORAL_TABLET | Freq: Two times a day (BID) | ORAL | Status: DC
  Administered 2019-03-06 – 2019-03-07 (×3): 600 mg via ORAL
  Filled 2019-03-06 (×3): qty 1

## 2019-03-06 MED ORDER — LIDOCAINE VISCOUS HCL 2 % MT SOLN
15.0000 mL | Freq: Once | OROMUCOSAL | Status: AC
  Administered 2019-03-06: 15 mL via OROMUCOSAL
  Filled 2019-03-06: qty 15

## 2019-03-06 MED ORDER — TRETINOIN 0.025 % EX CREA: 1.0000 g | TOPICAL_CREAM | Freq: Every day | CUTANEOUS | Status: DC

## 2019-03-06 MED ORDER — ONDANSETRON HCL 4 MG/2ML IJ SOLN: 4.0000 mg | Freq: Four times a day (QID) | INTRAMUSCULAR | Status: DC | PRN

## 2019-03-06 NOTE — ED Notes (Signed)
Bleeding appears controlled.

## 2019-03-06 NOTE — ED Notes (Addendum)
Spoke to admitting MD no order for IV or labs and perform send out covid swab

## 2019-03-06 NOTE — Consult Note (Addendum)
error 

## 2019-03-06 NOTE — ED Provider Notes (Signed)
[facility] Emergency Department Provider Note _____________   First MD Initiated Contact with Patient 03/06/19 0011     (approximate)  I have reviewed the triage vital signs and the nursing notes.   HISTORY  Chief Complaint Post-op Problem   HPI Tammy Garza is a 38 y.o. female with below list of previous medical conditions including DVT for which the patient is taking Eliquis twice daily and recent hemorrhoidectomy perianal skin tag removal performed on 02/26/2019 by Dr. Celine Ahr presents to the emergency department secondary to acute onset of anal bleeding which began tonight.  Patient states that she was laying down and stood up when she noticed blood running down her leg.  Patient states that she had not defecated recently before the onset of bleeding.  Patient states that when she went to the bathroom and urinated when she wiped she noted considerable blood on the tissue.  Patient states that the current bleeding is consistent with that of a period.        Past Medical History:  Diagnosis Date  . Anemia    h/o  . Cervical high risk human papillomavirus (HPV) DNA test positive   . Chronic gastritis   . Depression   . DVT (deep venous thrombosis) ([place]) 09/2016   left leg  . GERD (gastroesophageal reflux disease)   . H. pylori infection   . History of attempted suicide   . History of gastric polyp   . History of kidney stones    h/o  . Hyperhidrosis   . Migraines   . Protein C deficiency ([place])   . Rash   . Shift work sleep disorder     Patient Active Problem List   Diagnosis Date Noted  . Anal skin tag   . Grade II hemorrhoids   . Hypocalcemia 10/13/2018  . Internal hemorrhoid 01/22/2018  . Protein C deficiency ([place]) 12/10/2016  . Monoclonal gammopathy of unknown significance (MGUS) 11/30/2016  . History of DVT (deep vein thrombosis) 11/02/2016  . Elevated blood protein 09/26/2016  . Chronic deep vein thrombosis (DVT) of distal vein of  left lower extremity ([place]) 09/17/2016  . Vitamin D deficiency 07/26/2015  . Thyroid cyst 07/26/2015  . History of Helicobacter pylori infection 04/22/2015  . Migraine with aura and without status migrainosus 04/22/2015  . Shift work sleep disorder 04/22/2015    Past Surgical History:  Procedure Laterality Date  . ABDOMINAL HYSTERECTOMY    . ANTERIOR CRUCIATE LIGAMENT REPAIR Left   . CHOLECYSTECTOMY    . CYSTOSCOPY  12/19/2016   Procedure: CYSTOSCOPY;  Surgeon: Gae Dry, MD;  Location: [facility] ORS;  Service: Gynecology;;  . ESOPHAGOGASTRODUODENOSCOPY (EGD) WITH PROPOFOL N/A 09/25/2017   Procedure: ESOPHAGOGASTRODUODENOSCOPY (EGD) WITH PROPOFOL;  Surgeon: Jonathon Bellows, MD;  Location: [facility] ENDOSCOPY;  Service: Gastroenterology;  Laterality: N/A;  . LAPAROSCOPIC HYSTERECTOMY Bilateral 12/19/2016   Procedure: HYSTERECTOMY TOTAL LAPAROSCOPIC BILATERAL SALPINGECTOMY;  Surgeon: Gae Dry, MD;  Location: [facility] ORS;  Service: Gynecology;  Laterality: Bilateral;  . RECTAL EXAM UNDER ANESTHESIA N/A 02/26/2019   Procedure: RECTAL EXAM UNDER ANESTHESIA, EXCISION OF ANAL SKIN TAG;  Surgeon: Fredirick Maudlin, MD;  Location: [facility] ORS;  Service: General;  Laterality: N/A;  . TUBAL LIGATION      Prior to Admission medications   Medication Sig Start Date End Date Taking? Authorizing Provider  acetaminophen (TYLENOL) 500 MG tablet Take 1,000 mg by mouth every 6 (six) hours as needed for moderate pain or headache.    [provider]  baclofen (LIORESAL) 10 MG tablet Take 10 mg by mouth 2 (two) times daily as needed (migraines).  10/17/18   [provider]  calcium carbonate (OS-CAL - DOSED IN MG OF ELEMENTAL CALCIUM) 1250 (500 Ca) MG tablet Take 2 tablets by mouth daily.    [provider]  chlorproMAZINE (THORAZINE) 25 MG tablet Take 25 mg by mouth every 2 (two) hours as needed (migraines).  10/17/18   [provider]  clindamycin (CLEOCIN T) 1 % lotion Apply 1 g  topically daily. On face  05/28/18   Dasher, Rayvon Char, MD  diclofenac sodium (VOLTAREN) 1 % GEL Apply 2 g topically 4 (four) times daily. 09/06/18   [name], MD  docusate sodium (COLACE) 100 MG capsule Take 1 capsule (100 mg total) by mouth 2 (two) times daily. Do not take if bowel movements are too loose. 02/26/19 03/28/19  Fredirick Maudlin, MD  ELDERBERRY PO Take 10 mLs by mouth daily.    [provider]  ELIQUIS 5 MG TABS tablet TAKE 1 TABLET BY MOUTH TWICE A DAY Patient taking differently: Take 5 mg by mouth 2 (two) times daily.  01/12/19   Lequita Asal, MD  gabapentin (NEURONTIN) 600 MG tablet Take 600 mg by mouth 2 (two) times daily.  11/09/17   Orie Rout, MD  glycopyrrolate (ROBINUL) 1 MG tablet Take 3 mg by mouth daily. 01/29/17   Dasher, Rayvon Char, MD  ibuprofen (ADVIL) 800 MG tablet Take 1 tablet (800 mg total) by mouth every 8 (eight) hours as needed. 02/26/19   Fredirick Maudlin, MD  oxyCODONE (OXY IR/ROXICODONE) 5 MG immediate release tablet Take 1 tablet (5 mg total) by mouth every 6 (six) hours as needed for severe pain. 02/26/19   Fredirick Maudlin, MD  tretinoin (RETIN-A) 0.025 % cream Apply 1 g topically at bedtime. 05/27/18   Dasher, Rayvon Char, MD    Allergies Topamax [topiramate]  Family History  Problem Relation Age of Onset  . HIV Sister   . Cancer Sister 75       cervical cancer  . Asthma Daughter   . Protein C deficiency Daughter   . Hypertension Maternal Grandmother   . Hypertension Mother   . Obesity Mother   . Cancer Father 42       lung cancer  . Deep vein thrombosis Father   . Protein C deficiency Father   . COPD Father   . Congestive Heart Failure Father   . Diabetes Brother   . COPD Paternal Grandmother   . Deep vein thrombosis Paternal Grandmother   . Protein C deficiency Daughter     Social History Social History   Tobacco Use  . Smoking status: Former Smoker    Packs/day: 0.25    Years: 5.00    Pack years: 1.25     Types: Cigarettes    Start date: 05/15/2002    Quit date: 05/16/2007    Years since quitting: 11.8  . Smokeless tobacco: Never Used  Substance Use Topics  . Alcohol use: Yes    Alcohol/week: 1.0 standard drinks    Types: 1 Glasses of wine per week    Comment: occ wine none last 24 hrs  . Drug use: Yes    Types: Marijuana    Review of Systems Constitutional: No fever/chills Eyes: No visual changes. ENT: No sore throat. Cardiovascular: Denies chest pain. Respiratory: Denies shortness of breath. Gastrointestinal: No abdominal pain.  No nausea, no vomiting.  No diarrhea.  No  constipation. Genitourinary: Negative for dysuria.  Positive for bleeding from anus. Musculoskeletal: Negative for neck pain.  Negative for back pain. Integumentary: Negative for rash. Neurological: Negative for headaches, focal weakness or numbness.  ____________________________________________   PHYSICAL EXAM:  VITAL SIGNS: ED Triage Vitals [03/05/19 2308]  Enc Vitals Group     BP (!) 104/55     Pulse Rate 75     Resp 17     Temp 98.9 F (37.2 C)     Temp Source Oral     SpO2 99 %     Weight      Height      Head Circumference      Peak Flow      Pain Score      Pain Loc      Pain Edu?      Excl. in [place]?     Constitutional: Alert and oriented.  Apparent discomfort Eyes: Conjunctivae are normal.  Head: Atraumatic. Mouth/Throat: Patient is wearing a mask. Neck: No stridor.  No meningeal signs.   Cardiovascular: Normal rate, regular rhythm. Good peripheral circulation. Grossly normal heart sounds. Respiratory: Normal respiratory effort.  No retractions. Gastrointestinal: Soft and nontender. No distention.  Genitourinary: Active moderate bleeding noted from the anus approximately.  Quarter size area of skin denudement noted extending from the 5 to 11 o'clock position of the anus. Musculoskeletal: No lower extremity tenderness nor edema. No gross deformities of extremities. Neurologic:  Normal  speech and language. No gross focal neurologic deficits are appreciated.  Skin:  Skin is warm, dry and intact. Psychiatric: Mood and affect are normal. Speech and behavior are normal.     Procedures   ____________________________________________   INITIAL IMPRESSION / MDM / ASSESSMENT AND PLAN / ED COURSE  As part of my medical decision making, I reviewed the following data within the electronic MEDICAL RECORD NUMBER   38 year old female presented with above-stated history and physical exam secondary to postoperative bleeding status post hemorrhoidectomy and skin tag removal.  Patient was evaluated by Dr. Lysle Pearl general surgeon in the emergency department who will admit the patient for observation.  Patient had continued bleeding while in the emergency department      ____________________________________________  FINAL CLINICAL IMPRESSION(S) / ED DIAGNOSES  Final diagnoses:  Postoperative vaginal bleeding following non-genitourinary procedure  Wound dehiscence     MEDICATIONS GIVEN DURING THIS VISIT:  Medications  lidocaine (XYLOCAINE) 2 % viscous mouth solution 15 mL (15 mLs Mouth/Throat Given 03/06/19 0025)     ED Discharge Orders    None      *Please note:  Zena Weerts was evaluated in Emergency Department on 03/06/2019 for the symptoms described in the history of present illness. She was evaluated in the context of the global COVID-19 pandemic, which necessitated consideration that the patient might be at risk for infection with the SARS-CoV-2 virus that causes COVID-19. Institutional protocols and algorithms that pertain to the evaluation of patients at risk for COVID-19 are in a state of rapid change based on information released by regulatory bodies including the CDC and federal and state organizations. These policies and algorithms were followed during the patient's care in the ED.  Some ED evaluations and interventions may be delayed as a result of limited staffing  during the pandemic.*  Note:  This document was prepared using Dragon voice recognition software and may include unintentional dictation errors.   Gregor Hams, MD 03/06/19 [phone]

## 2019-03-06 NOTE — Progress Notes (Signed)
Ch visited with pt to provided AD education and social support. Pt is here for DVT and a surgical procedure. Pt also shared that she suffers from migraines, anemia, and protein C deficiently. 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 Pt appreciated visit.    03/06/19 0930  Clinical Encounter Type  Visited With Patient  Visit Type Other (Comment);Post-op;Social support;Spiritual support (AD education )  Referral From Nurse  Consult/Referral To Chaplain  Spiritual Encounters  Spiritual Needs Emotional;Grief support  Stress Factors  Patient Stress Factors Health changes;Loss of control;Major life changes  Family Stress Factors None identified  Advance Directives (For Healthcare)  Does Patient Have a Medical Advance Directive? No  Would patient like information on creating a medical advance directive? Yes (Inpatient - patient defers creating a medical advance directive at this time - Information given)

## 2019-03-06 NOTE — ED Notes (Signed)
Dr Garald Balding at bedside.

## 2019-03-06 NOTE — ED Notes (Signed)
ED TO INPATIENT HANDOFF REPORT  ED Nurse Name and Phone #: Anson Crofts Name/Age/Gender Tammy Garza 38 y.o. female Room/Bed: ED17A/ED17A  Code Status   Code Status: Not on file  Home/SNF/Other Home Patient oriented to: self, place, time and situation Is this baseline? Yes   Triage Complete: Triage complete  Chief Complaint post-op issue from rectum  Triage Note Pt had skin tag and hemroids removed from outside of anus on 10/14 and tonight pt started to have bleeding from outside of anus. Pt reported bleeding started at approx 2230.    Allergies Allergies  Allergen Reactions  . Topamax [Topiramate] Other (See Comments)    Confusion , slurred speech     Level of Care/Admitting Diagnosis ED Disposition    ED Disposition Condition Comment   Admit  Hospital Area: [facility] [[id number]]  Level of Care: Med-Surg [16]  Covid Evaluation: Asymptomatic Screening Protocol (No Symptoms)  Diagnosis: Open wound [id number]  Admitting Physician: Benjamine Sprague [id number]  Attending Physician: Benjamine Sprague [phone]  PT Class (Do Not Modify): Observation [104]  PT Acc Code (Do Not Modify): Observation [10022]       B Medical/Surgery History Past Medical History:  Diagnosis Date  . Anemia    h/o  . Cervical high risk human papillomavirus (HPV) DNA test positive   . Chronic gastritis   . Depression   . DVT (deep venous thrombosis) ([place]) 09/2016   left leg  . GERD (gastroesophageal reflux disease)   . H. pylori infection   . History of attempted suicide   . History of gastric polyp   . History of kidney stones    h/o  . Hyperhidrosis   . Migraines   . Protein C deficiency ([place])   . Rash   . Shift work sleep disorder    Past Surgical History:  Procedure Laterality Date  . ABDOMINAL HYSTERECTOMY    . ANTERIOR CRUCIATE LIGAMENT REPAIR Left   . CHOLECYSTECTOMY    . CYSTOSCOPY  12/19/2016   Procedure: CYSTOSCOPY;  Surgeon: Gae Dry, MD;  Location:  [facility] ORS;  Service: Gynecology;;  . ESOPHAGOGASTRODUODENOSCOPY (EGD) WITH PROPOFOL N/A 09/25/2017   Procedure: ESOPHAGOGASTRODUODENOSCOPY (EGD) WITH PROPOFOL;  Surgeon: Jonathon Bellows, MD;  Location: [facility] ENDOSCOPY;  Service: Gastroenterology;  Laterality: N/A;  . LAPAROSCOPIC HYSTERECTOMY Bilateral 12/19/2016   Procedure: HYSTERECTOMY TOTAL LAPAROSCOPIC BILATERAL SALPINGECTOMY;  Surgeon: Gae Dry, MD;  Location: [facility] ORS;  Service: Gynecology;  Laterality: Bilateral;  . RECTAL EXAM UNDER ANESTHESIA N/A 02/26/2019   Procedure: RECTAL EXAM UNDER ANESTHESIA, EXCISION OF ANAL SKIN TAG;  Surgeon: Fredirick Maudlin, MD;  Location: [facility] ORS;  Service: General;  Laterality: N/A;  . TUBAL LIGATION       A IV Location/Drains/Wounds Patient Lines/Drains/Airways Status   Active Line/Drains/Airways    Name:   Placement date:   Placement time:   Site:   Days:   Peripheral IV 04/18/18 Left Antecubital   04/18/18    1443    Antecubital   322   Incision (Closed) 12/19/16 Abdomen   12/19/16    1236     807   Incision (Closed) 12/19/16 Vagina   12/19/16    1236     807   Incision (Closed) 02/26/19 Rectum   02/26/19    0926     8   Incision - 3 Ports Abdomen 1: Right 2: Left 3: Umbilicus   0000000    [id number]     807  Intake/Output Last 24 hours No intake or output data in the 24 hours ending 03/06/19 0141  Labs/Imaging No results found for this or any previous visit (from the past 71 hour(s)). No results found.  Pending Labs Unresulted Labs (From admission, onward)    Start     Ordered   03/06/19 0135  SARS CORONAVIRUS 2 (TAT 6-24 HRS) Nasopharyngeal Nasopharyngeal Swab  (Asymptomatic/Tier 2 Patients Labs)  Once,   STAT    Question Answer Comment  Is this test for diagnosis or screening Screening   Symptomatic for COVID-19 as defined by CDC No   Hospitalized for COVID-19 No   Admitted to ICU for COVID-19 No   Previously tested for COVID-19 Yes   Resident in a congregate (group) care  setting No   Employed in healthcare setting No   Pregnant No      03/06/19 0135   Signed and Held  CBC  Daily,   R     Signed and Held          Vitals/Pain Today's Vitals   03/05/19 2308 03/06/19 0016 03/06/19 0030 03/06/19 0045  BP: (!) 104/55 119/79 108/69   Pulse: 75  68 71  Resp: 17     Temp: 98.9 F (37.2 C)     TempSrc: Oral     SpO2: 99%  98% 99%    Isolation Precautions No active isolations  Medications Medications  lidocaine (XYLOCAINE) 2 % viscous mouth solution 15 mL (15 mLs Mouth/Throat Given 03/06/19 0025)    Mobility walks Low fall risk   Focused Assessments surgical   R Recommendations: See Admitting Provider Note  Report given to:   Additional Notes:

## 2019-03-06 NOTE — Progress Notes (Addendum)
[place] Hospital Day(s): 0.   Post-op days: 10/14 - excision of anal skin tag x2  Interval History: Patient seen and examined, no acute events or new complaints overnight. Patient reports she continues to notice bright red blood per return. She does form some clots but continues to bleed after dressing changes. Does endorse some light headedness. No other complaints. She is on Eliquis secondary to DVT and history of protein C deficiency   Review of Systems:  Constitutional: denies fever, chills, + light headedness Respiratory: denies any shortness of breath  Cardiovascular: denies chest pain or palpitations  Gastrointestinal: denies abdominal pain, N/V, or diarrhea/and bowel function as per interval history Musculoskeletal: denies pain, decreased motor or sensation  Vital signs in last 24 hours: [min-max] current  Temp:  [97.9 F (36.6 C)-98.9 F (37.2 C)] 97.9 F (36.6 C) (10/22 0217) Pulse Rate:  [62-75] 62 (10/22 0217) Resp:  [16-17] 16 (10/22 0217) BP: (103-119)/(55-79) 103/64 (10/22 0217) SpO2:  [98 %-100 %] 100 % (10/22 0217) Weight:  [84 kg] 84 kg (10/22 0217)     Height: 5\' 6"  (167.6 cm) Weight: 84 kg BMI (Calculated): 29.9   Intake/Output last 2 shifts:  10/21 0701 - 10/22 0700 In: 95.8 [I.V.:95.8] Out: 0    Physical Exam:  Constitutional: alert, cooperative and no distress  Respiratory: breathing non-labored at rest  Cardiovascular: regular rate and sinus rhythm  Genitourinary / Integumentary: There are two open wounds to the lateral aspect on the anal verge at the 3 & 9 o'clock position, there is clotted and bright red blood present, wound bases appear healthy.   Labs:  CBC Latest Ref Rng & Units 01/15/2019 10/08/2018 07/15/2018  WBC 4.0 - 10.5 K/uL 7.3 6.4 8.6  Hemoglobin 12.0 - 15.0 g/dL 12.4 12.2 11.7(L)  Hematocrit 36.0 - 46.0 % 36.9 38.0 35.7(L)  Platelets 150 - 400 K/uL 224 247 235   CMP Latest Ref Rng & Units  01/15/2019 10/08/2018 07/15/2018  Glucose 70 - 99 mg/dL 95 95 101(H)  BUN 6 - 20 mg/dL 18 11 14   Creatinine 0.44 - 1.00 mg/dL 0.66 0.75 0.85  Sodium 135 - 145 mmol/L 136 138 137  Potassium 3.5 - 5.1 mmol/L 3.7 3.7 4.1  Chloride 98 - 111 mmol/L 105 107 106  CO2 22 - 32 mmol/L 23 24 26   Calcium 8.9 - 10.3 mg/dL 8.8(L) 8.3(L) 8.5(L)  Total Protein 6.5 - 8.1 g/dL 7.5 7.6 7.4  Total Bilirubin 0.3 - 1.2 mg/dL 0.4 1.3(H) 0.7  Alkaline Phos 38 - 126 U/L 42 43 44  AST 15 - 41 U/L 13(L) 15 13(L)  ALT 0 - 44 U/L 13 13 13      Imaging studies: No new pertinent imaging studies   Assessment/Plan: 38 y.o. female with bleeding from previous operative sites at anal opening which is exacerbated by Eliquis use   - Okay for diet  - pain control prn   - Applied Silver Nitrate + Fibrillar at bedside this morning.   - DO NOT CHANGE DRESSING FOR 12 HOURS  - Hold Eliquis   - medical management of comorbidities   All of the above findings and recommendations were discussed with the patient, patient's family, and the medical team, and all of patient's and family's questions were answered to their expressed satisfaction.  -- Edison Simon, PA-C [facility] 03/06/2019, 10:15 AM [phone] M-F: 7am - 4pm   I saw and evaluated the patient.  I agree with the above  documentation, exam, and plan, which I have edited where appropriate. Fredirick Maudlin  11:58 AM

## 2019-03-06 NOTE — Progress Notes (Signed)
Surgicel placed on rectal wound. Minimal bleeding when bandaging was removed.

## 2019-03-06 NOTE — H&P (Signed)
Subjective:  CC: Hemorrhoids  HPI:  Tammy Garza is a 38 y.o. female who was consulted for evaluation of hemorrhoids.  Patient underwent exam under anesthesia, open hemorrhoidectomy and skin tag removal using harmonic scalpel.  She was recovering as expected with pain and occasional bleeding, until a recent bowel movement where she felt an increase in pain as well as large amounts of bleeding.  The bleeding continued therefore she presented to the emergency department for further evaluation.  ED provider noted a large open wound so surgery was consulted.  Patient states the pain is terrible every time she has a bowel movement, and the bleeding accompanies that as well.  She has noticed intermittent bleeding in between as well.     Past Medical History:  has a past medical history of Anemia, Cervical high risk human papillomavirus (HPV) DNA test positive, Chronic gastritis, Depression, DVT (deep venous thrombosis) (HCC) (09/2016), GERD (gastroesophageal reflux disease), H. pylori infection, History of attempted suicide, History of gastric polyp, History of kidney stones, Hyperhidrosis, Migraines, Protein C deficiency ([place]), Rash, and Shift work sleep disorder.  Past Surgical History:  has a past surgical history that includes Tubal ligation; Cholecystectomy; Laparoscopic hysterectomy (Bilateral, 12/19/2016); Cystoscopy (12/19/2016); Abdominal hysterectomy; Anterior cruciate ligament repair (Left); Esophagogastroduodenoscopy (egd) with propofol (N/A, 09/25/2017); and Rectal exam under anesthesia (N/A, 02/26/2019).  Family History: family history includes Asthma in her daughter; COPD in her father and paternal grandmother; Cancer (age of onset: 87) in her sister; Cancer (age of onset: 71) in her father; Congestive Heart Failure in her father; Deep vein thrombosis in her father and paternal grandmother; Diabetes in her brother; HIV in her sister; Hypertension in her maternal grandmother and mother;  Obesity in her mother; Protein C deficiency in her daughter, daughter, and father.  Social History:  reports that she quit smoking about 11 years ago. Her smoking use included cigarettes. She started smoking about 16 years ago. She has a 1.25 pack-year smoking history. She has never used smokeless tobacco. She reports current alcohol use of about 1.0 standard drinks of alcohol per week. She reports current drug use. Drug: Marijuana.  Current Medications: (Not in a hospital admission)   Allergies:       Allergies as of 03/05/2019 - Review Complete 03/05/2019  Allergen Reaction Noted  . Topamax [topiramate] Other (See Comments) 02/20/2018    ROS:  General: Denies weight loss, weight gain, fatigue, fevers, chills, and night sweats. Eyes: Denies blurry vision, double vision, eye pain, itchy eyes, and tearing. Ears: Denies hearing loss, earache, and ringing in ears. Nose: Denies sinus pain, congestion, infections, runny nose, and nosebleeds. Mouth/throat: Denies hoarseness, sore throat, bleeding gums, and difficulty swallowing. Heart: Denies chest pain, palpitations, racing heart, irregular heartbeat, leg pain or swelling, and decreased activity tolerance. Respiratory: Denies breathing difficulty, shortness of breath, wheezing, cough, and sputum. GI: Denies change in appetite, heartburn, nausea, vomiting, diarrhea. GU: Denies difficulty urinating, pain with urinating, urgency, frequency, blood in urine. Musculoskeletal: Denies joint stiffness, pain, swelling, muscle weakness. Skin: Denies rash, itching, mass, tumors, sores, and boils Neurologic: Denies headache, fainting, dizziness, seizures, numbness, and tingling. Psychiatric: Denies depression, anxiety, difficulty sleeping, and memory loss. Endocrine: Denies heat or cold intolerance, and increased thirst or urination. Blood/lymph: Denies easy bruising, easy bruising, and swollen glands    Objective:   BP 108/69   Pulse 71    Temp 98.9 F (37.2 C) (Oral)   Resp 17   LMP 01/04/2016 Comment: partial hysterectomy, tubal   SpO2 99%  Constitutional :  alert, cooperative, appears stated age and no distress  Lymphatics/Throat::  no asymmetry, masses, or scars  Respiratory:  clear to auscultation bilaterally  Cardiovascular:  regular rate and rhythm  Gastrointestinal: soft, non-tender; bowel sounds normal; no masses,  no organomegaly.   Musculoskeletal: Steady gait and movement  Skin: Cool and moist.  Psychiatric: Normal affect, non-agitated, not confused  Genital/Rectal:  Chaperone present for exam.  External exam notable for 2 open wounds along the anal verge, consistent with recent skin tag removal.  Wound base is pink with healthy granulation tissue.  Area had decent amount of old blood clots, no sign of active bleeding or fresh blood.    LABS:  n/a   RADS: n/a Assessment:   Bleeding from former hemorrhoidectomy sites. Plan:  After the physical exam, patient attempted to ambulate and started noticing fresh blood dripping from the same area again.  Unfortunately with repeated external exam there was again no evidence of active bleeding, including fresh blood within the rectal canal with gentle retraction.  I explained to her that we can take her to the operating room for another exam under anesthesia, or we admit her for observation overnight and continue to assess the degree of bleeding noted.  Patient requested that we keep her in-house overnight to see if the bleeding slows down to where we can avoid a repeat exam under anesthesia, especially due to the likelihood that finding additional probing in the area can further cause more damage at this point.  Patient verbalized understanding

## 2019-03-07 LAB — CBC
HCT: 31.2 % — ABNORMAL LOW (ref 36.0–46.0)
Hemoglobin: 9.8 g/dL — ABNORMAL LOW (ref 12.0–15.0)
MCH: 29.9 pg (ref 26.0–34.0)
MCHC: 31.4 g/dL (ref 30.0–36.0)
MCV: 95.1 fL (ref 80.0–100.0)
Platelets: 242 10*3/uL (ref 150–400)
RBC: 3.28 MIL/uL — ABNORMAL LOW (ref 3.87–5.11)
RDW: 11.9 % (ref 11.5–15.5)
WBC: 9 10*3/uL (ref 4.0–10.5)
nRBC: 0 % (ref 0.0–0.2)

## 2019-03-07 MED ORDER — DOCUSATE SODIUM 100 MG PO CAPS
100.0000 mg | ORAL_CAPSULE | Freq: Two times a day (BID) | ORAL | Status: DC
  Administered 2019-03-07: 100 mg via ORAL
  Filled 2019-03-07: qty 1

## 2019-03-07 MED ORDER — OXYCODONE HCL 5 MG PO TABS: 5.0000 mg | ORAL_TABLET | Freq: Four times a day (QID) | ORAL | 0 refills | Status: DC | PRN

## 2019-03-07 NOTE — Progress Notes (Signed)
[facility] Hospital Day(s): 1.   Post-op days: 10/14 - excision of anal skin tag x2  Interval History: Patient seen and examined, no acute events or new complaints overnight. She did not tolerate silver nitrate cauterization yesterday, but Fibrilar was applied with resolution of bleeding.  She reports that her dressing has not required changing since yesterday afternoon. Eliquis was held.  Review of Systems:  Constitutional: denies fever, chills, + light headedness Respiratory: denies any shortness of breath  Cardiovascular: denies chest pain or palpitations  Gastrointestinal: denies abdominal pain, N/V, or diarrhea/and bowel function as per interval history Musculoskeletal: denies pain, decreased motor or sensation  Vital signs in last 24 hours: [min-max] current  Temp:  [98.4 F (36.9 C)-98.7 F (37.1 C)] 98.7 F (37.1 C) (10/23 0832) Pulse Rate:  [64-69] 68 (10/23 0832) Resp:  [17-20] 18 (10/23 0832) BP: (109-116)/(59-79) 116/73 (10/23 0832) SpO2:  [100 %] 100 % (10/23 0832)     Height: 5\' 6"  (167.6 cm) Weight: 84 kg BMI (Calculated): 29.9   Intake/Output last 2 shifts:  10/22 0701 - 10/23 0700 In: -  Out: 901 [Urine:901]   Physical Exam:  Constitutional: alert, cooperative and no distress  Respiratory: breathing non-labored at rest  Cardiovascular: regular rate and sinus rhythm  Genitourinary / Integumentary: There are two open wounds to the lateral aspect on the anal verge at the 3 & 9 o'clock position, no fresh bleeding noted. Dressings are dry.  Labs:  CBC Latest Ref Rng & Units 03/07/2019 01/15/2019 10/08/2018  WBC 4.0 - 10.5 K/uL 9.0 7.3 6.4  Hemoglobin 12.0 - 15.0 g/dL 9.8(L) 12.4 12.2  Hematocrit 36.0 - 46.0 % 31.2(L) 36.9 38.0  Platelets 150 - 400 K/uL 242 224 247   CMP Latest Ref Rng & Units 01/15/2019 10/08/2018 07/15/2018  Glucose 70 - 99 mg/dL 95 95 101(H)  BUN 6 - 20 mg/dL 18 11 14   Creatinine 0.44 - 1.00 mg/dL 0.66  0.75 0.85  Sodium 135 - 145 mmol/L 136 138 137  Potassium 3.5 - 5.1 mmol/L 3.7 3.7 4.1  Chloride 98 - 111 mmol/L 105 107 106  CO2 22 - 32 mmol/L 23 24 26   Calcium 8.9 - 10.3 mg/dL 8.8(L) 8.3(L) 8.5(L)  Total Protein 6.5 - 8.1 g/dL 7.5 7.6 7.4  Total Bilirubin 0.3 - 1.2 mg/dL 0.4 1.3(H) 0.7  Alkaline Phos 38 - 126 U/L 42 43 44  AST 15 - 41 U/L 13(L) 15 13(L)  ALT 0 - 44 U/L 13 13 13      Imaging studies: No new pertinent imaging studies   Assessment/Plan: 38 y.o. female with bleeding from previous operative sites at anal opening which is exacerbated by Eliquis use   - Okay for diet  - pain control prn   - patient would like to wait for d/c until she has had a BM and does not start bleeding again  - Continue to hold Eliquis   - medical management of comorbidities   All of the above findings and recommendations were discussed with the patient, patient's family, and the medical team, and all of patient's and family's questions were answered to their expressed satisfaction.

## 2019-03-07 NOTE — Discharge Summary (Signed)
Physician Discharge Summary  Patient ID: Tammy Garza MRN: [id number] DOB/AGE: [birth year] 39 y.o.  Admit date: 03/06/2019 Discharge date: 03/07/2019  Admission Diagnoses: bleeding from surgical site  Discharge Diagnoses:  Active Problems:   Open wound   Discharged Condition: fair  Hospital Course: This is a 38 y/o F who is chronically anticoagulated with Eliquis for a history of VTE and protein C deficiency. She underwent excision of anal skin tags on 02/26/2019.  She resumed taking Eliquis 2 days later.  On 03/06/2019, she presented to the ED with bleeding from her operative sites. This occurred after having a large bowel movement.  No active bleeding was seen on exam, but she did have clot present and her dressings had more blood on them than expected.  Eliquis was stopped and the wounds treated with topical hemostatic agents. On hospital day 2, she had a BM that did not result in significant bleeding and she was felt safe to be d/c'd to home.    Consults: None  Significant Diagnostic Studies: none  Treatments: Cessation of anticoagulation and topical hemostatic agents.  Discharge Exam: Blood pressure 119/75, pulse 67, temperature 98.2 F (36.8 C), temperature source Oral, resp. rate 17, height 5\' 6"  (1.676 m), weight 84 kg, last menstrual period 01/04/2016, SpO2 100 %. General appearance: alert, cooperative and no distress Resp: clear to auscultation bilaterally Cardio: regular rate and rhythm Incision/Wound:no active bleeding present.  Disposition: Discharge disposition: 01-Home or Self Care       Discharge Instructions    Call MD for:  difficulty breathing, headache or visual disturbances   Complete by: As directed    Call MD for:  extreme fatigue   Complete by: As directed    Call MD for:  hives   Complete by: As directed    Call MD for:  persistant dizziness or light-headedness   Complete by: As directed    Call MD for:  persistant nausea and vomiting    Complete by: As directed    Call MD for:  redness, tenderness, or signs of infection (pain, swelling, redness, odor or green/yellow discharge around incision site)   Complete by: As directed    Call MD for:  severe uncontrolled pain   Complete by: As directed    Call MD for:  temperature >100.4   Complete by: As directed    Change dressing (specify)   Complete by: As directed    Dressing change: as needed.   Diet - low sodium heart healthy   Complete by: As directed    Discharge wound care:   Complete by: As directed    Do not allow yourself to become constipated.  Use over-the-counter stool softeners, fiber supplements, MiraLAX, etc., to prevent this.  Perform sitz baths 2-3 times daily and after every bowel movement.   Increase activity slowly   Complete by: As directed    Lifting restrictions   Complete by: As directed    No lifting anything heavier than 39 lbs for 6 weeks.     Allergies as of 03/07/2019      Reactions   Topamax [topiramate] Other (See Comments)   Confusion , slurred speech       Medication List    TAKE these medications   acetaminophen 500 MG tablet Commonly known as: TYLENOL Take 1,000 mg by mouth every 6 (six) hours as needed for moderate pain or headache.   baclofen 10 MG tablet Commonly known as: LIORESAL Take 10 mg by mouth 2 (two) times daily  as needed (migraines).   calcium carbonate 1250 (500 Ca) MG tablet Commonly known as: OS-CAL - dosed in mg of elemental calcium Take 2 tablets by mouth daily.   chlorproMAZINE 25 MG tablet Commonly known as: THORAZINE Take 25 mg by mouth every 2 (two) hours as needed (migraines).   clindamycin 1 % lotion Commonly known as: CLEOCIN T Apply 1 g topically daily. On face   docusate sodium 100 MG capsule Commonly known as: Colace Take 1 capsule (100 mg total) by mouth 2 (two) times daily. Do not take if bowel movements are too loose.   ELDERBERRY PO Take 10 mLs by mouth daily.   Eliquis 5 MG Tabs  tablet Generic drug: apixaban TAKE 1 TABLET BY MOUTH TWICE A DAY What changed: how much to take   gabapentin 600 MG tablet Commonly known as: NEURONTIN Take 600 mg by mouth 2 (two) times daily.   glycopyrrolate 1 MG tablet Commonly known as: ROBINUL Take 3 mg by mouth daily.   ibuprofen 800 MG tablet Commonly known as: ADVIL Take 1 tablet (800 mg total) by mouth every 8 (eight) hours as needed.   oxyCODONE 5 MG immediate release tablet Commonly known as: Oxy IR/ROXICODONE Take 1 tablet (5 mg total) by mouth every 6 (six) hours as needed for severe pain. What changed: Another medication with the same name was added. Make sure you understand how and when to take each.   oxyCODONE 5 MG immediate release tablet Commonly known as: Oxy IR/ROXICODONE Take 1 tablet (5 mg total) by mouth every 6 (six) hours as needed for severe pain. What changed: You were already taking a medication with the same name, and this prescription was added. Make sure you understand how and when to take each.   tretinoin 0.025 % cream Commonly known as: RETIN-A Apply 1 g topically at bedtime.            Discharge Care Instructions  (From admission, onward)         Start     Ordered   03/07/19 0000  Change dressing (specify)    Comments: Dressing change: as needed.   03/07/19 1613   03/07/19 0000  Discharge wound care:    Comments: Do not allow yourself to become constipated.  Use over-the-counter stool softeners, fiber supplements, MiraLAX, etc., to prevent this.  Perform sitz baths 2-3 times daily and after every bowel movement.   03/07/19 1613         Follow-up Information    Fredirick Maudlin, MD. Schedule an appointment as soon as possible for a visit in 1 week(s).   Specialty: General Surgery Contact information: [address] [place] [address] [phone]           Signed: Fredirick Maudlin 03/07/2019, 4:14 PM

## 2019-03-07 NOTE — Progress Notes (Signed)
[name]  A and O x 4 VSS. Pt tolerating diet well. No complaints of pain or nausea. IV removed intact, prescriptions given. Pt voices understanding of discharge instructions with no further questions. Pt discharged via wheelchair with axillary.   Allergies as of 03/07/2019      Reactions   Topamax [topiramate] Other (See Comments)   Confusion , slurred speech       Medication List    TAKE these medications   acetaminophen 500 MG tablet Commonly known as: TYLENOL Take 1,000 mg by mouth every 6 (six) hours as needed for moderate pain or headache.   baclofen 10 MG tablet Commonly known as: LIORESAL Take 10 mg by mouth 2 (two) times daily as needed (migraines).   calcium carbonate 1250 (500 Ca) MG tablet Commonly known as: OS-CAL - dosed in mg of elemental calcium Take 2 tablets by mouth daily.   chlorproMAZINE 25 MG tablet Commonly known as: THORAZINE Take 25 mg by mouth every 2 (two) hours as needed (migraines).   clindamycin 1 % lotion Commonly known as: CLEOCIN T Apply 1 g topically daily. On face   docusate sodium 100 MG capsule Commonly known as: Colace Take 1 capsule (100 mg total) by mouth 2 (two) times daily. Do not take if bowel movements are too loose.   ELDERBERRY PO Take 10 mLs by mouth daily.   Eliquis 5 MG Tabs tablet Generic drug: apixaban TAKE 1 TABLET BY MOUTH TWICE A DAY What changed: how much to take   gabapentin 600 MG tablet Commonly known as: NEURONTIN Take 600 mg by mouth 2 (two) times daily.   glycopyrrolate 1 MG tablet Commonly known as: ROBINUL Take 3 mg by mouth daily.   ibuprofen 800 MG tablet Commonly known as: ADVIL Take 1 tablet (800 mg total) by mouth every 8 (eight) hours as needed.   oxyCODONE 5 MG immediate release tablet Commonly known as: Oxy IR/ROXICODONE Take 1 tablet (5 mg total) by mouth every 6 (six) hours as needed for severe pain. What changed: Another medication with the same name was added. Make sure you  understand how and when to take each.   oxyCODONE 5 MG immediate release tablet Commonly known as: Oxy IR/ROXICODONE Take 1 tablet (5 mg total) by mouth every 6 (six) hours as needed for severe pain. What changed: You were already taking a medication with the same name, and this prescription was added. Make sure you understand how and when to take each.   tretinoin 0.025 % cream Commonly known as: RETIN-A Apply 1 g topically at bedtime.            Discharge Care Instructions  (From admission, onward)         Start     Ordered   03/07/19 0000  Change dressing (specify)    Comments: Dressing change: as needed.   03/07/19 1613   03/07/19 0000  Discharge wound care:    Comments: Do not allow yourself to become constipated.  Use over-the-counter stool softeners, fiber supplements, MiraLAX, etc., to prevent this.  Perform sitz baths 2-3 times daily and after every bowel movement.   03/07/19 1613          Vitals:   03/07/19 0832 03/07/19 1347  BP: 116/73 119/75  Pulse: 68 67  Resp: 18 17  Temp: 98.7 F (37.1 C) 98.2 F (36.8 C)  SpO2: 100% 100%    Valera Vallas Payton Mccallum

## 2019-03-10 ENCOUNTER — Telehealth: Payer: Self-pay | Admitting: General Surgery

## 2019-03-10 NOTE — Telephone Encounter (Signed)
If she is not having any bleeding issues, she may resume it.

## 2019-03-10 NOTE — Telephone Encounter (Signed)
Per Dr.Cannon may resume Eliquis. Patient states no bleeding issues at this time.

## 2019-03-10 NOTE — Telephone Encounter (Signed)
Patient is calling and has some questions about a medication please call patient and advise.

## 2019-03-13 ENCOUNTER — Ambulatory Visit (INDEPENDENT_AMBULATORY_CARE_PROVIDER_SITE_OTHER): Payer: Self-pay | Admitting: General Surgery

## 2019-03-13 ENCOUNTER — Encounter: Payer: Self-pay | Admitting: General Surgery

## 2019-03-13 ENCOUNTER — Other Ambulatory Visit: Payer: Self-pay

## 2019-03-13 VITALS — BP 108/60 | HR 70 | Temp 97.9°F | Resp 15 | Wt 182.0 lb

## 2019-03-13 DIAGNOSIS — K644 Residual hemorrhoidal skin tags: Secondary | ICD-10-CM

## 2019-03-13 NOTE — Progress Notes (Signed)
Tammy Garza is here today for a postoperative visit.  She is a 38 year old woman who underwent excision of anal skin tags and a hemorrhoidectomy on October 14.  She is chronically anticoagulated for protein C deficiency and resumed her Eliquis 2 days postop.  She presented to the emergency department on October 22 with bleeding from the site.  Her Eliquis was stopped again and the area was treated with local topical hemostatic agents.  She was able to defecate without causing increased bleeding and was discharged home on the second day after her admission.  Today, she states that she has had no further episodes of bleeding.  She resumed her Eliquis 3 days ago.  She says that the sharp pains are now resolved, but she still has some burning and itching in the area.  Today's Vitals   03/13/19 0910  BP: 108/60  Pulse: 70  Resp: 15  Temp: 97.9 F (36.6 C)  Weight: 182 lb (82.6 kg)   Body mass index is 29.38 kg/m. Focused examination: The resection areas are healing nicely.  There is no purulent drainage present.  There is slight mucousy discharge.  No bleeding identified.  Impression and plan: This is a 38 year old woman who underwent a hemorrhoidectomy and excision of anal skin tags about 2 weeks ago.  Her postoperative course was complicated by bleeding, likely secondary to chronic anticoagulation.  This is resolved and she is healing appropriately.  She was counseled to continue to perform sitz bath several times daily as well as after every bowel movement.  She may try applying a topical ointment, such as Desitin or A&D to help minimize itching, although she was cautioned that this may cause local irritation to the still raw skin surfaces.  We will see her on an as-needed basis.

## 2019-03-13 NOTE — Patient Instructions (Addendum)
Continue sitz baths. Avoid constipation. Follow-up with our office as needed.  Please call and ask to speak with a nurse if you develop questions or concerns.

## 2019-03-18 ENCOUNTER — Encounter: Payer: 59 | Admitting: General Surgery

## 2019-03-18 ENCOUNTER — Telehealth: Payer: Self-pay | Admitting: *Deleted

## 2019-03-18 NOTE — Telephone Encounter (Signed)
Faxed Disability today at [phone]

## 2019-03-25 ENCOUNTER — Telehealth: Payer: Self-pay

## 2019-03-25 ENCOUNTER — Other Ambulatory Visit: Payer: Self-pay | Admitting: Hematology and Oncology

## 2019-03-25 NOTE — Telephone Encounter (Signed)
Refill request for Eliquis 5 mg 1 tab po BID # 60 with 1 refill.Eliquis has been refilled today .

## 2019-04-03 ENCOUNTER — Ambulatory Visit (INDEPENDENT_AMBULATORY_CARE_PROVIDER_SITE_OTHER): Payer: 59 | Admitting: Podiatry

## 2019-04-03 DIAGNOSIS — M722 Plantar fascial fibromatosis: Secondary | ICD-10-CM

## 2019-04-08 ENCOUNTER — Other Ambulatory Visit: Payer: Self-pay

## 2019-04-08 ENCOUNTER — Telehealth: Payer: Self-pay

## 2019-04-08 ENCOUNTER — Ambulatory Visit (INDEPENDENT_AMBULATORY_CARE_PROVIDER_SITE_OTHER): Payer: 59

## 2019-04-08 ENCOUNTER — Ambulatory Visit: Payer: 59 | Admitting: Podiatry

## 2019-04-08 DIAGNOSIS — M722 Plantar fascial fibromatosis: Secondary | ICD-10-CM

## 2019-04-08 DIAGNOSIS — M79671 Pain in right foot: Secondary | ICD-10-CM | POA: Diagnosis not present

## 2019-04-08 DIAGNOSIS — M7662 Achilles tendinitis, left leg: Secondary | ICD-10-CM

## 2019-04-08 DIAGNOSIS — M79672 Pain in left foot: Secondary | ICD-10-CM

## 2019-04-08 NOTE — Telephone Encounter (Signed)
Unable to reach patient for pre charting for appt tomorrow. Left message to call back

## 2019-04-09 ENCOUNTER — Other Ambulatory Visit: Payer: Self-pay

## 2019-04-09 ENCOUNTER — Inpatient Hospital Stay: Payer: 59

## 2019-04-09 ENCOUNTER — Inpatient Hospital Stay: Payer: 59 | Attending: Hematology and Oncology | Admitting: Oncology

## 2019-04-09 DIAGNOSIS — I825Z2 Chronic embolism and thrombosis of unspecified deep veins of left distal lower extremity: Secondary | ICD-10-CM

## 2019-04-12 ENCOUNTER — Encounter: Payer: Self-pay | Admitting: Podiatry

## 2019-04-12 NOTE — Progress Notes (Signed)
Subjective:  Patient ID: Tammy Garza, female    DOB: December 22, 1980,  MRN: [id number]  Chief Complaint  Patient presents with  . Foot Pain    Patient presents today for bilat heel pain x 2-3 months    38 y.o. female presents with the above complaint.  Patient presents with bilateral heel pain that has started about a month ago it is very tender to touch and very painful when walking left is worse than right.  Patient states is worse in the morning and when she has been getting up from a sitting position to standing position.  She has tried frozen ice bottle stretching Tylenol none of that has helped.  She denies any other treatment modalities see she denies seeing any other podiatrist for this.  She also has a secondary complaint of left Achilles insertional pain that started about at the same time.  She attempted the same treatment modalities as for plantar fasciitis without any relief.   Review of Systems: Negative except as noted in the HPI. Denies N/V/F/Ch.  Past Medical History:  Diagnosis Date  . Anemia    h/o  . Cervical high risk human papillomavirus (HPV) DNA test positive   . Chronic gastritis   . Depression   . DVT (deep venous thrombosis) ([place]) 09/2016   left leg  . GERD (gastroesophageal reflux disease)   . H. pylori infection   . History of attempted suicide   . History of gastric polyp   . History of kidney stones    h/o  . Hyperhidrosis   . Migraines   . Protein C deficiency ([place])   . Rash   . Shift work sleep disorder     Current Outpatient Medications:  .  acetaminophen (TYLENOL) 500 MG tablet, Take 1,000 mg by mouth every 6 (six) hours as needed for moderate pain or headache., Disp: , Rfl:  .  baclofen (LIORESAL) 10 MG tablet, Take 10 mg by mouth 2 (two) times daily as needed (migraines). , Disp: , Rfl:  .  calcium carbonate (OS-CAL - DOSED IN MG OF ELEMENTAL CALCIUM) 1250 (500 Ca) MG tablet, Take 2 tablets by mouth daily., Disp: , Rfl:  .  chlorproMAZINE  (THORAZINE) 25 MG tablet, Take 25 mg by mouth every 2 (two) hours as needed (migraines). , Disp: , Rfl:  .  clindamycin (CLEOCIN T) 1 % lotion, Apply 1 g topically daily. On face , Disp: , Rfl:  .  ELDERBERRY PO, Take 10 mLs by mouth daily., Disp: , Rfl:  .  ELIQUIS 5 MG TABS tablet, TAKE 1 TABLET BY MOUTH TWICE A DAY, Disp: 60 tablet, Rfl: 1 .  gabapentin (NEURONTIN) 600 MG tablet, Take 600 mg by mouth 2 (two) times daily. , Disp: , Rfl: 5 .  glycopyrrolate (ROBINUL) 1 MG tablet, Take 3 mg by mouth daily., Disp: , Rfl: 2 .  ibuprofen (ADVIL) 800 MG tablet, Take 1 tablet (800 mg total) by mouth every 8 (eight) hours as needed., Disp: 30 tablet, Rfl: 0 .  predniSONE (STERAPRED UNI-PAK 21 TAB) 10 MG (21) TBPK tablet, Take by mouth daily., Disp: , Rfl:  .  tretinoin (RETIN-A) 0.025 % cream, Apply 1 g topically at bedtime., Disp: , Rfl:   Social History   Tobacco Use  Smoking Status Former Smoker  . Packs/day: 0.25  . Years: 5.00  . Pack years: 1.25  . Types: Cigarettes  . Start date: 05/15/2002  . Quit date: 05/16/2007  . Years since quitting: 81.9  Smokeless Tobacco Never Used    Allergies  Allergen Reactions  . Topamax [Topiramate] Other (See Comments)    Confusion , slurred speech    Objective:  There were no vitals filed for this visit. There is no height or weight on file to calculate BMI. Constitutional Well developed. Well nourished.  Vascular Dorsalis pedis pulses palpable bilaterally. Posterior tibial pulses palpable bilaterally. Capillary refill normal to all digits.  No cyanosis or clubbing noted. Pedal hair growth normal.  Neurologic Normal speech. Oriented to person, place, and time. Epicritic sensation to light touch grossly present bilaterally.  Dermatologic Nails well groomed and normal in appearance. No open wounds. No skin lesions.  Orthopedic: Normal joint ROM without pain or crepitus bilaterally. No visible deformities. Tender to palpation at the calcaneal  tuber bilaterally. No pain with calcaneal squeeze bilaterally. Ankle ROM diminished range of motion bilaterally. Silfverskiold Test: negative bilaterally.  Left Achilles tendinitis at the insertion.  No pain along the course of the tendon.  Pain on palpation at the Achilles insertion site.  Pain with dorsiflexion active and passive.   Radiographs: Taken and reviewed. No acute fractures or dislocations. No evidence of stress fracture.  Plantar heel spur absent. Posterior heel spur absent.  No ossicles noted within the tendons.  No other bony deformities noted.  Assessment:   1. Plantar fasciitis of right foot   2. Plantar fasciitis of left foot   3. Pain in right foot   4. Left foot pain   5. Achilles tendinitis, left leg    Plan:  Patient was evaluated and treated and all questions answered.  Plantar Fasciitis, bilaterally - XR reviewed as above.  - Educated on icing and stretching. Instructions given.  - Injection delivered to the plantar fascia as below. - DME: Plantar Fascial Brace - Pharmacologic management: Meloxicam/Medrol Dose Pak. Educated on risks/benefits and proper taking of medication.  Left Achilles tendinitis -I explained to the patient the etiology and various treatment options available to treat Achilles insertional pain.  I believe that patient will benefit from a injection into the Kager's triangle which will help decrease inflammation surrounding the Achilles tendon.  I explained to her the risk of injecting within the tendon with a high correlation of rupture rates. -A steroid injection was performed at [facility] using 1% plain Lidocaine and 10 mg of Kenalog. This was well tolerated.  Procedure: Injection Tendon/Ligament Location: Bilateral plantar fascia at the glabrous junction; medial approach. Skin Prep: alcohol Injectate: 0.5 cc 0.5% marcaine plain, 0.5 cc of 1% Lidocaine, 0.5 cc kenalog 10. Disposition: Patient tolerated procedure well. Injection site  dressed with a band-aid.  No follow-ups on file.

## 2019-04-14 NOTE — Progress Notes (Signed)
[facility]  [address] [place], [place] [address] Phone: [phone]  Fax: [phone]   Telemedicine Office Visit:  04/16/2019  Referring physician: [name], MD  I connected with Tammy Garza on 04/16/2019 at 8:51 AM by videoconferencing and verified that I was speaking with the correct person using 2 identifiers.  The patient was at home.  I discussed the limitations, risk, security and privacy concerns of performing an evaluation and management service by videoconferencing and the availability of in person appointments.  I also discussed with the patient that there may be a patient responsible charge related to this service.  The patient expressed understanding and agreed to proceed.   Chief Complaint: Tammy Garza is a 38 y.o. female with a DVT of the left lower extremity and protein C deficiency on Eliquis who is seen for 7 month assessment.   HPI: The patient was last seen in the hematology clinic on 10/09/2018 via telemedicine. At that time, she was doing well.  She had some lower extremity edema when standing for long periods of time. Hematocrit 38.0, hemoglobin 12.2, platelets 247,000, WBC 6,400. Calcium 8.3. Total bilirubin 1.3. M-spike was 0. She was advised to take calcium BID. She continued Eliquis.   She was seen by Dr. Marius Ditch on 10/25/2018 and 11/05/2018 for symptomatic grade 2 hemorrhoids. She was unresponsive to maximal medical therapy. Patient requested band ligation of her hemorrhoidal disease. Patient stopped Eliquis x 2 days before banding on each occasion.  Eliquis was restarted.   She had a virtual visit in the symptom management clinic with [name], NP on 12/04/2018. She noted a swollen and tender left ankle. She had some intermittent numbness in her toes. She denies any B symptoms. She had no shortness of breath or chest pains. Burns, NP ordered a lower extremity doppler to rule out possible DVT and a left ankle x-ray  to rule out injury.   LLE venous doppler ultrasound on 12/04/2018 revealed no evidence of acute or chronic DVT within the LLE. Left ankle x-ray on 12/04/2018 was negative.   She had an initial visit with Dr. Fredirick Maudlin on 12/11/2018. The patient was interested in surgical removal of perianal skin tag. The patient noted difficulty keeping the area clean. She noted occasional itchy and irritation. There was occasional bleeding at the base of the tags when they become irritated.  She denied constipation.  She denied any pain or bleeding with bowel movements. Dr. Celine Ahr discussed the surgical procedure and the patient agreed.   She underwent an excision of a rectal skin tag on 02/26/2019 with Dr. Celine Ahr.   The patient was admitted on 03/06/2019 - 03/07/2019 at [facility] for a open wound at the surgical site. She underwent excision of anal skin tags on 02/26/2019.  She resumed taking Eliquis 2 days later.  On 03/06/2019, she presented to the ER with bleeding from her operative sites. This occurred after having a large bowel movement.  No active bleeding was seen on exam, but she did have clot present and her dressings had more blood on them than expected.    During her admission, Eliquis was stopped and the wounds treated with topical hemostatic agents.  Silver nitrate and special surgical gauze was used to control bleeding. At discharge, she was to continue sitz bath several times daily as well as after every bowel movement.   She had a follow up with Dr. Celine Ahr on 03/13/2019. The patient noted no further episodes of bleeding. Eliquis was  restarted on 03/10/2019. Sharp pain during bowel movements had resolved. She continued top have itching and burning in the area.   Labs on 01/15/2019: Hematocrit 36.9, hemoglobin 12.4, platelets 224,000, WBC 7,300.  Calcium was 9.1.  M spike was 0.   During the interim, she has felt good. She is taking calcium BID. She reports being diagnosed with plantar fasciitis.  Patient saw Dr. Posey Pronto 1 week and she was given a shot and a plantar fasciitis brace.  She notes some LLE swelling and tenderness and numbness one day ago related to working on her feet for her 12 hour shifts.     Past Medical History:  Diagnosis Date   Anemia    h/o   Cervical high risk human papillomavirus (HPV) DNA test positive    Chronic gastritis    Depression    DVT (deep venous thrombosis) ([place]) 09/2016   left leg   GERD (gastroesophageal reflux disease)    H. pylori infection    History of attempted suicide    History of gastric polyp    History of kidney stones    h/o   Hyperhidrosis    Migraines    Protein C deficiency ([place])    Rash    Shift work sleep disorder     Past Surgical History:  Procedure Laterality Date   ABDOMINAL HYSTERECTOMY     ANTERIOR CRUCIATE LIGAMENT REPAIR Left    CHOLECYSTECTOMY     CYSTOSCOPY  12/19/2016   Procedure: CYSTOSCOPY;  Surgeon: Gae Dry, MD;  Location: [facility] ORS;  Service: Gynecology;;   ESOPHAGOGASTRODUODENOSCOPY (EGD) WITH PROPOFOL N/A 09/25/2017   Procedure: ESOPHAGOGASTRODUODENOSCOPY (EGD) WITH PROPOFOL;  Surgeon: Jonathon Bellows, MD;  Location: [facility] ENDOSCOPY;  Service: Gastroenterology;  Laterality: N/A;   LAPAROSCOPIC HYSTERECTOMY Bilateral 12/19/2016   Procedure: HYSTERECTOMY TOTAL LAPAROSCOPIC BILATERAL SALPINGECTOMY;  Surgeon: Gae Dry, MD;  Location: [facility] ORS;  Service: Gynecology;  Laterality: Bilateral;   RECTAL EXAM UNDER ANESTHESIA N/A 02/26/2019   Procedure: RECTAL EXAM UNDER ANESTHESIA, EXCISION OF ANAL SKIN TAG;  Surgeon: Fredirick Maudlin, MD;  Location: [facility] ORS;  Service: General;  Laterality: N/A;   TUBAL LIGATION      Family History  Problem Relation Age of Onset   HIV Sister    Cancer Sister 88       cervical cancer   Asthma Daughter    Protein C deficiency Daughter    Hypertension Maternal Grandmother    Hypertension Mother    Obesity Mother    Cancer Father 8        lung cancer   Deep vein thrombosis Father    Protein C deficiency Father    COPD Father    Congestive Heart Failure Father    Diabetes Brother    COPD Paternal Grandmother    Deep vein thrombosis Paternal Grandmother    Protein C deficiency Daughter     Social History:  reports that she quit smoking about 11 years ago. Her smoking use included cigarettes. She started smoking about 16 years ago. She has a 1.25 pack-year smoking history. She has never used smokeless tobacco. She reports current alcohol use of about 1.0 standard drinks of alcohol per week. She reports current drug use. Drug: Marijuana. 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 She wears a mask at work. She  has 2 daughters (ages 48 and 51). The patient is alone today.  Participants in the patient's visit and their role in the encounter included the patient and Waymon Budge, RN, today.  The intake visit was provided by Waymon Budge, RN.  Allergies:  Allergies  Allergen Reactions   Topamax [Topiramate] Other (See Comments)    Confusion , slurred speech     Current Medications: Current Outpatient Medications  Medication Sig Dispense Refill   acetaminophen (TYLENOL) 500 MG tablet Take 1,000 mg by mouth every 6 (six) hours as needed for moderate pain or headache.     baclofen (LIORESAL) 10 MG tablet Take 10 mg by mouth 2 (two) times daily as needed (migraines).      calcium carbonate (OS-CAL - DOSED IN MG OF ELEMENTAL CALCIUM) 1250 (500 Ca) MG tablet Take 2 tablets by mouth daily.     chlorproMAZINE (THORAZINE) 25 MG tablet Take 25 mg by mouth every 2 (two) hours as needed (migraines).      clindamycin (CLEOCIN T) 1 % lotion Apply 1 g topically daily. On face      ELDERBERRY PO Take 10 mLs by mouth daily.     ELIQUIS 5  MG TABS tablet TAKE 1 TABLET BY MOUTH TWICE A DAY 60 tablet 1   gabapentin (NEURONTIN) 600 MG tablet Take 600 mg by mouth 2 (two) times daily.   5   glycopyrrolate (ROBINUL) 1 MG tablet Take 3 mg by mouth daily.  2   tretinoin (RETIN-A) 0.025 % cream Apply 1 g topically at bedtime.     No current facility-administered medications for this visit.     Review of Systems  Constitutional: Negative.  Negative for chills, diaphoresis, fever, malaise/fatigue and weight loss.       Feels "good".  HENT: Negative.  Negative for congestion, ear pain, hearing loss, nosebleeds, sinus pain and sore throat.   Eyes: Negative.  Negative for blurred vision and double vision.  Respiratory: Negative.  Negative for cough, hemoptysis, sputum production and shortness of breath.   Cardiovascular: Positive for leg swelling (LLE; plantar fasciitis). Negative for chest pain and palpitations.  Gastrointestinal: Negative.  Negative for abdominal pain, blood in stool, constipation, diarrhea, melena, nausea and vomiting.  Genitourinary: Negative.  Negative for dysuria, frequency, hematuria and urgency.  Musculoskeletal: Positive for myalgias (LLE tenderness). Negative for back pain, joint pain and neck pain.       S/p knee surgery; no issues with ambulation.  Skin: Negative.  Negative for itching and rash.  Neurological: Positive for sensory change (LLE). Negative for dizziness, tingling, speech change, focal weakness, weakness and headaches (migraine).  Endo/Heme/Allergies: Negative.  Does not bruise/bleed easily.  Psychiatric/Behavioral: Negative.  Negative for depression and memory loss. The patient is not nervous/anxious and does not have insomnia.   All other systems reviewed and are negative.   Performance status (ECOG): 1  Physical Exam  Constitutional: She is oriented to person, place, and time. She appears well-developed and well-nourished. No distress.  HENT:  Head: Normocephalic and atraumatic.  Short  dark hair.  Eyes: Conjunctivae and EOM are normal. No scleral icterus.  Brown eyes.  Neurological: She is alert and oriented to person, place, and time.  Skin: She is not diaphoretic.  Psychiatric: She has a normal mood and affect. Her behavior is normal. Judgment and thought content normal.  Nursing note and vitals reviewed.   Orders Only on 04/15/2019  Component Date Value Ref Range Status   Sodium 04/15/2019 139  135 -  145 mmol/L Final   Potassium 04/15/2019 4.3  3.5 - 5.1 mmol/L Final   Chloride 04/15/2019 104  98 - 111 mmol/L Final   CO2 04/15/2019 25  22 - 32 mmol/L Final   Glucose, Bld 04/15/2019 100* 70 - 99 mg/dL Final   BUN 04/15/2019 17  6 - 20 mg/dL Final   Creatinine, Ser 04/15/2019 0.86  0.44 - 1.00 mg/dL Final   Calcium 04/15/2019 9.1  8.9 - 10.3 mg/dL Final   Total Protein 04/15/2019 8.2* 6.5 - 8.1 g/dL Final   Albumin 04/15/2019 4.8  3.5 - 5.0 g/dL Final   AST 04/15/2019 15  15 - 41 U/L Final   ALT 04/15/2019 14  0 - 44 U/L Final   Alkaline Phosphatase 04/15/2019 48  38 - 126 U/L Final   Total Bilirubin 04/15/2019 0.6  0.3 - 1.2 mg/dL Final   GFR calc non Af Amer 04/15/2019 >60  >60 mL/min Final   GFR calc Af Amer 04/15/2019 >60  >60 mL/min Final   Anion gap 04/15/2019 10  5 - 15 Final   Performed at [facility], [address] [place], [place] [address]   WBC 04/15/2019 9.7  4.0 - 10.5 K/uL Final   RBC 04/15/2019 3.91  3.87 - 5.11 MIL/uL Final   Hemoglobin 04/15/2019 12.0  12.0 - 15.0 g/dL Final   HCT 04/15/2019 38.0  36.0 - 46.0 % Final   MCV 04/15/2019 97.2  80.0 - 100.0 fL Final   MCH 04/15/2019 30.7  26.0 - 34.0 pg Final   MCHC 04/15/2019 31.6  30.0 - 36.0 g/dL Final   RDW 04/15/2019 12.4  11.5 - 15.5 % Final   Platelets 04/15/2019 292  150 - 400 K/uL Final   nRBC 04/15/2019 0.0  0.0 - 0.2 % Final   Neutrophils Relative % 04/15/2019 52  % Final   Neutro Abs 04/15/2019 5.1  1.7 - 7.7 K/uL Final   Lymphocytes Relative  04/15/2019 34  % Final   Lymphs Abs 04/15/2019 3.3  0.7 - 4.0 K/uL Final   Monocytes Relative 04/15/2019 10  % Final   Monocytes Absolute 04/15/2019 1.0  0.1 - 1.0 K/uL Final   Eosinophils Relative 04/15/2019 2  % Final   Eosinophils Absolute 04/15/2019 0.2  0.0 - 0.5 K/uL Final   Basophils Relative 04/15/2019 1  % Final   Basophils Absolute 04/15/2019 0.1  0.0 - 0.1 K/uL Final   Immature Granulocytes 04/15/2019 1  % Final   Abs Immature Granulocytes 04/15/2019 0.05  0.00 - 0.07 K/uL Final   Performed at [facility], [address], [place], [place] [address]    Assessment:  Tammy Garza is a 38 y.o. female with protein C deficiencyand a left lower extremity DVTfollowing a 2 1/2 hour flight. She had been on DepoProvera x 1 year. She has a family history of thrombosis.  Left lower extremity duplexon 09/17/2016 revealed an acute appearing thrombus in the calf veins of the left lower extremity (posterior tibial and peroneal veins). Bilateral lower extremity duplexon 11/20/2016 revealed no evidence of acute DVT within either lower extremity. There was some residual nonocclusive thrombus in the left peroneal vein, sequela of previous DVT on the prior scan. Left lower extremity duplexon 07/05/2017 revealed an isolated thrombus of the left peroneal vein in the calf similar to 11/20/2016. There was no acute thrombus. She is on Eliquis.   Hypercoagulable work-up on 05/15/2018and 07/19/2018revealed the following negative studies: Factor V Leiden, prothrombin gene mutation, lupus anticoagulant  panel, anticardiolipin antibodies, beta2-glycoprotein, protein S antigen/activity, and ATIII antigen/activity. She has protein C deficiency. Labs on 11/30/2016 revealed a protein C total of 40% (60 - 150%) and protein C activity of 49% (73-180%). Repeat testing on 03/29/2017 revealed a protein C total of 51% and a protein C activity of 51%.  She has a monoclonal gammopathy of  unknown significance. SPEP on 09/26/2016 was negative. Immunofixation revealed an IgA monoclonal protein with lambda light chain specificity. Free light chain assay was normal. IgG was 698 ([phone]). IgA was 182 (87-352). 24 hour UPEP on 10/22/2016 revealed no monoclonal protein. SPEPon 03/29/2017, 10/05/2017, 10/08/2018, and 01/15/2019 were negative.Free light chain ratiowas normal on 09/26/2016 and 10/05/2017.  Chest, abdomen, and pelvic CTon 12/08/2016 revealed no lymphadenopathy or hepatosplenomegaly.  She underwent total laparoscopic hysterectomywith bilateral salpingectomy on 12/19/2016. She was off Eliquis x 3 days (2 days pre-op and day of surgery). She developed a small superficial vein thrombus.  She underwent surgery (ACL reconstruction with autograft) on 05/25/2017. Left lower extremity duplexon 07/05/2017 revealed an isolated thrombus of the left peroneal vein in the calf. As compared to 11/20/2016, the thrombus within the left peroneal vein appeared similar. There was no acute thrombus.  LLE venous doppler ultrasound on 12/04/2018 revealed no evidence of acute or chronic DVT within the LLE.  Symptomatically, she is doing well.  She has intermittent lower extremity swelling after working 12 hour shifts.  Calcium is 9.1.  Plan: 1.   Labs today: CBC with diff, CMP. 2.   Protein C deficiencyand left lower extremity DVT Clinically, she is doing well.             Continue Eliquis. 3.   Monoclonal gammopathy of unknown significance M-spike was 0 on 01/15/2019.  Immunofixation revealed an IgA monoclonal protein with lambda light chain specificity on 10/06/2016.   Significance unclear.  Patient asymptomatic.  No B symptoms, issues with infections or bone pain.  Check myeloma panel in 1 year.   If immunofixation negative, then further follow-up based on symptoms or concerning lab abnormalities. 4.   Hypocalcemia             Calcium 9.1 on  04/15/2019.  Continue supplementation. 5.   RTC in 6 months for MD assessment and labs (CBC with diff, CMP).  I discussed the assessment and treatment plan with the patient.  The patient was provided an opportunity to ask questions and all were answered.  The patient agreed with the plan and demonstrated an understanding of the instructions.  The patient was advised to call back or seek an in person evaluation if the symptoms worsen or if the condition fails to improve as anticipated.  I provided 15 minutes (8:51 AM - 9:06 AM) of face-to-face video visit time during this this encounter and > 50% was spent counseling as documented under my assessment and plan.  I provided these services from the [facility] office.   Nolon Stalls, MD, PhD  04/16/2019, 8:51 AM  I, Selena Batten, am acting as scribe for [employer]. Mike Gip, MD, PhD.  I, Lenis Nettleton C. Mike Gip, MD, have reviewed the above documentation for accuracy and completeness, and I agree with the above.

## 2019-04-15 ENCOUNTER — Inpatient Hospital Stay: Payer: 59 | Attending: Hematology and Oncology

## 2019-04-15 ENCOUNTER — Other Ambulatory Visit: Payer: Self-pay

## 2019-04-15 ENCOUNTER — Other Ambulatory Visit: Payer: 59

## 2019-04-15 DIAGNOSIS — Z86718 Personal history of other venous thrombosis and embolism: Secondary | ICD-10-CM | POA: Diagnosis present

## 2019-04-15 DIAGNOSIS — D6859 Other primary thrombophilia: Secondary | ICD-10-CM | POA: Diagnosis not present

## 2019-04-15 DIAGNOSIS — I825Z2 Chronic embolism and thrombosis of unspecified deep veins of left distal lower extremity: Secondary | ICD-10-CM

## 2019-04-15 DIAGNOSIS — Z7901 Long term (current) use of anticoagulants: Secondary | ICD-10-CM | POA: Insufficient documentation

## 2019-04-15 DIAGNOSIS — Z79899 Other long term (current) drug therapy: Secondary | ICD-10-CM | POA: Insufficient documentation

## 2019-04-15 DIAGNOSIS — D472 Monoclonal gammopathy: Secondary | ICD-10-CM | POA: Insufficient documentation

## 2019-04-15 LAB — COMPREHENSIVE METABOLIC PANEL
ALT: 14 U/L (ref 0–44)
AST: 15 U/L (ref 15–41)
Albumin: 4.8 g/dL (ref 3.5–5.0)
Alkaline Phosphatase: 48 U/L (ref 38–126)
Anion gap: 10 (ref 5–15)
BUN: 17 mg/dL (ref 6–20)
CO2: 25 mmol/L (ref 22–32)
Calcium: 9.1 mg/dL (ref 8.9–10.3)
Chloride: 104 mmol/L (ref 98–111)
Creatinine, Ser: 0.86 mg/dL (ref 0.44–1.00)
GFR calc Af Amer: >60 mL/min (ref >60)
GFR calc non Af Amer: >60 mL/min (ref >60)
Glucose, Bld: 100 mg/dL — ABNORMAL HIGH (ref 70–99)
Potassium: 4.3 mmol/L (ref 3.5–5.1)
Sodium: 139 mmol/L (ref 135–145)
Total Bilirubin: 0.6 mg/dL (ref 0.3–1.2)
Total Protein: 8.2 g/dL — ABNORMAL HIGH (ref 6.5–8.1)

## 2019-04-15 LAB — CBC WITH DIFFERENTIAL/PLATELET
Abs Immature Granulocytes: 0.05 10*3/uL (ref 0.00–0.07)
Basophils Absolute: 0.1 10*3/uL (ref 0.0–0.1)
Basophils Relative: 1 %
Eosinophils Absolute: 0.2 10*3/uL (ref 0.0–0.5)
Eosinophils Relative: 2 %
HCT: 38 % (ref 36.0–46.0)
Hemoglobin: 12 g/dL (ref 12.0–15.0)
Immature Granulocytes: 1 %
Lymphocytes Relative: 34 %
Lymphs Abs: 3.3 10*3/uL (ref 0.7–4.0)
MCH: 30.7 pg (ref 26.0–34.0)
MCHC: 31.6 g/dL (ref 30.0–36.0)
MCV: 97.2 fL (ref 80.0–100.0)
Monocytes Absolute: 1 10*3/uL (ref 0.1–1.0)
Monocytes Relative: 10 %
Neutro Abs: 5.1 10*3/uL (ref 1.7–7.7)
Neutrophils Relative %: 52 %
Platelets: 292 10*3/uL (ref 150–400)
RBC: 3.91 MIL/uL (ref 3.87–5.11)
RDW: 12.4 % (ref 11.5–15.5)
WBC: 9.7 10*3/uL (ref 4.0–10.5)
nRBC: 0 % (ref 0.0–0.2)

## 2019-04-16 ENCOUNTER — Encounter: Payer: Self-pay | Admitting: Hematology and Oncology

## 2019-04-16 ENCOUNTER — Inpatient Hospital Stay (HOSPITAL_BASED_OUTPATIENT_CLINIC_OR_DEPARTMENT_OTHER): Payer: 59 | Admitting: Hematology and Oncology

## 2019-04-16 DIAGNOSIS — D472 Monoclonal gammopathy: Secondary | ICD-10-CM | POA: Diagnosis not present

## 2019-04-16 DIAGNOSIS — I825Z2 Chronic embolism and thrombosis of unspecified deep veins of left distal lower extremity: Secondary | ICD-10-CM

## 2019-04-16 DIAGNOSIS — D6859 Other primary thrombophilia: Secondary | ICD-10-CM

## 2019-04-16 NOTE — Progress Notes (Signed)
Confirmed Name, DOB, and Address. Denies any concerns.  

## 2019-04-29 ENCOUNTER — Ambulatory Visit: Payer: 59 | Admitting: Podiatry

## 2019-04-29 ENCOUNTER — Other Ambulatory Visit: Payer: Self-pay

## 2019-04-29 DIAGNOSIS — M722 Plantar fascial fibromatosis: Secondary | ICD-10-CM

## 2019-04-29 DIAGNOSIS — M79672 Pain in left foot: Secondary | ICD-10-CM | POA: Diagnosis not present

## 2019-04-29 DIAGNOSIS — M7732 Calcaneal spur, left foot: Secondary | ICD-10-CM | POA: Diagnosis not present

## 2019-04-29 DIAGNOSIS — M7662 Achilles tendinitis, left leg: Secondary | ICD-10-CM | POA: Diagnosis not present

## 2019-04-29 MED ORDER — METHYLPREDNISOLONE 4 MG PO TBPK: ORAL_TABLET | ORAL | 0 refills | Status: DC

## 2019-04-29 NOTE — Progress Notes (Signed)
Subjective:  Patient ID: Tammy Garza, female    DOB: 24-Jul-1980,  MRN: [id number]  Chief Complaint  Patient presents with  . Foot Pain    pt is here for bil foot pain, as well as swelling, pt also states that the injection she had last time has worn off    38 y.o. female presents with the above complaint.  Patient presents with follow-up for bilateral heel pain as well as left leg Achilles tendinitis.  Patient states the right side pain is considerably decreased and not much pain.  However the left side is slightly better but the pain came right back after the injection wore off.  She got relief for a couple of days with injection however it did not last long.  She has been doing her stretching exercises.  She did not like the plantar fascial brace.  Given the amount of pain that she is having, I believe patient may benefit from aggressive immobilization.  Patient did not get her Medrol Dosepak I will resend that.  She denies any other acute complaints.   Review of Systems: Negative except as noted in the HPI. Denies N/V/F/Ch.  Past Medical History:  Diagnosis Date  . Anemia    h/o  . Cervical high risk human papillomavirus (HPV) DNA test positive   . Chronic gastritis   . Depression   . DVT (deep venous thrombosis) ([place]) 09/2016   left leg  . GERD (gastroesophageal reflux disease)   . H. pylori infection   . History of attempted suicide   . History of gastric polyp   . History of kidney stones    h/o  . Hyperhidrosis   . Migraines   . Protein C deficiency ([place])   . Rash   . Shift work sleep disorder     Current Outpatient Medications:  .  acetaminophen (TYLENOL) 500 MG tablet, Take 1,000 mg by mouth every 6 (six) hours as needed for moderate pain or headache., Disp: , Rfl:  .  baclofen (LIORESAL) 10 MG tablet, Take 10 mg by mouth 2 (two) times daily as needed (migraines). , Disp: , Rfl:  .  calcium carbonate (OS-CAL - DOSED IN MG OF ELEMENTAL CALCIUM) 1250 (500 Ca) MG  tablet, Take 2 tablets by mouth daily., Disp: , Rfl:  .  chlorproMAZINE (THORAZINE) 25 MG tablet, Take 25 mg by mouth every 2 (two) hours as needed (migraines). , Disp: , Rfl:  .  clindamycin (CLEOCIN T) 1 % lotion, Apply 1 g topically daily. On face , Disp: , Rfl:  .  ELDERBERRY PO, Take 10 mLs by mouth daily., Disp: , Rfl:  .  ELIQUIS 5 MG TABS tablet, TAKE 1 TABLET BY MOUTH TWICE A DAY, Disp: 60 tablet, Rfl: 1 .  gabapentin (NEURONTIN) 600 MG tablet, Take 600 mg by mouth 2 (two) times daily. , Disp: , Rfl: 5 .  glycopyrrolate (ROBINUL) 1 MG tablet, Take 3 mg by mouth daily., Disp: , Rfl: 2 .  tretinoin (RETIN-A) 0.025 % cream, Apply 1 g topically at bedtime., Disp: , Rfl:  .  methylPREDNISolone (MEDROL DOSEPAK) 4 MG TBPK tablet, Use as directed, Disp: 1 each, Rfl: 0  Social History   Tobacco Use  Smoking Status Former Smoker  . Packs/day: 0.25  . Years: 5.00  . Pack years: 1.25  . Types: Cigarettes  . Start date: 05/15/2002  . Quit date: 05/16/2007  . Years since quitting: 11.9  Smokeless Tobacco Never Used    Allergies  Allergen  Reactions  . Topamax [Topiramate] Other (See Comments)    Confusion , slurred speech    Objective:  There were no vitals filed for this visit. There is no height or weight on file to calculate BMI. Constitutional Well developed. Well nourished.  Vascular Dorsalis pedis pulses palpable bilaterally. Posterior tibial pulses palpable bilaterally. Capillary refill normal to all digits.  No cyanosis or clubbing noted. Pedal hair growth normal.  Neurologic Normal speech. Oriented to person, place, and time. Epicritic sensation to light touch grossly present bilaterally.  Dermatologic Nails well groomed and normal in appearance. No open wounds. No skin lesions.  Orthopedic: Normal joint ROM without pain or crepitus bilaterally. No visible deformities. Tender to palpation at the calcaneal tuber left No pain with calcaneal squeeze bilaterally. Ankle ROM  diminished range of motion bilaterally. Silfverskiold Test: negative bilaterally.  Left Achilles tendinitis at the insertion.  No pain along the course of the tendon.  Pain on palpation at the Achilles insertion site.  Pain with dorsiflexion active and passive.   Radiographs: None  Assessment:   No diagnosis found. Plan:  Patient was evaluated and treated and all questions answered.  Plantar Fasciitis, left - XR reviewed as above.  -Re-educated on icing and stretching. Instructions given.  - Injection delivered to the plantar fascia as below. - DME: Cam boot with steel toe covering - Pharmacologic management: Medrol Dose Pak. Educated on risks/benefits and proper taking of medication. -I believe patient will benefit from another steroid injection as described below.  If patient's pain is not resolved with a cam boot was total covering I will need to discuss surgical intervention at the next visit.  Given that the left side is not improving compared to the right side, I will discuss surgical intervention with endoscopic plantar fasciotomy if no improvement.  Left Achilles tendinitis -The injection did give her some temporary relief, however it did not completely alleviate her pain.  However I think believe this is due to compensatory mechanism from protecting the heel pain and is aggravating the Achilles tendon.  I believe once heel pain is healed the pain from the Achilles tendinitis will resolve.  If not resolved I will treat that accordingly after treating the heel pain. -I will hold off on any injection to the left Achilles at this time.  Procedure: Injection Tendon/Ligament Location: Bilateral plantar fascia at the glabrous junction; medial approach. Skin Prep: alcohol Injectate: 0.5 cc 0.5% marcaine plain, 0.5 cc of 1% Lidocaine, 0.5 cc kenalog 10. Disposition: Patient tolerated procedure well. Injection site dressed with a band-aid.  No follow-ups on file.

## 2019-04-30 ENCOUNTER — Encounter: Payer: Self-pay | Admitting: Podiatry

## 2019-05-01 ENCOUNTER — Telehealth: Payer: Self-pay | Admitting: Podiatry

## 2019-05-01 NOTE — Telephone Encounter (Signed)
Pt called to say she needs a note for her job giving her 5 days off work bc the boot he has her wearing her job will not allow her to wear it at work she will need a note to be out of work since she cant wear the boot at work her job fax number [phone] if a note can be faxed.

## 2019-05-02 NOTE — Telephone Encounter (Signed)
Faxed letter to pt's employer.

## 2019-05-06 ENCOUNTER — Ambulatory Visit (INDEPENDENT_AMBULATORY_CARE_PROVIDER_SITE_OTHER): Payer: 59 | Admitting: Podiatry

## 2019-05-06 ENCOUNTER — Encounter: Payer: Self-pay | Admitting: Podiatry

## 2019-05-06 ENCOUNTER — Other Ambulatory Visit: Payer: Self-pay

## 2019-05-06 DIAGNOSIS — M7662 Achilles tendinitis, left leg: Secondary | ICD-10-CM | POA: Diagnosis not present

## 2019-05-06 DIAGNOSIS — M2141 Flat foot [pes planus] (acquired), right foot: Secondary | ICD-10-CM | POA: Diagnosis not present

## 2019-05-06 DIAGNOSIS — M2142 Flat foot [pes planus] (acquired), left foot: Secondary | ICD-10-CM

## 2019-05-06 DIAGNOSIS — M79672 Pain in left foot: Secondary | ICD-10-CM

## 2019-05-06 DIAGNOSIS — M722 Plantar fascial fibromatosis: Secondary | ICD-10-CM | POA: Diagnosis not present

## 2019-05-07 ENCOUNTER — Encounter: Payer: Self-pay | Admitting: Podiatry

## 2019-05-07 NOTE — Progress Notes (Signed)
Subjective:  Patient ID: Tammy Garza, female    DOB: 02-20-1981,  MRN: [id number]  Chief Complaint  Patient presents with  . Plantar Fasciitis    pt is here for a f/u on plantar fasciitis, pt states that the left foot is still hurting, pt also states that the injection you gave her has not helped at all, wearing the boot has will hurt after an extended perioed of time    38 y.o. female presents with the above complaint.  Patient presents with a follow-up of left plantar fasciitis with an associated Achilles tendinitis of the left foot.  Patient states that the left foot is still hurting and is tender.  She states she has been wearing the the cam boot which has not been helping at all.  However it has been only like 1 week since I was last seen her.  She is actually here today to pick up her work note and to extend it.  Patient states that she has been doing stretching said but eventually the pain comes back.  Injection has not helped.  The medications that was prescribed has also not helped at all either.  She denies any other acute complaints   Review of Systems: Negative except as noted in the HPI. Denies N/V/F/Ch.  Past Medical History:  Diagnosis Date  . Anemia    h/o  . Cervical high risk human papillomavirus (HPV) DNA test positive   . Chronic gastritis   . Depression   . DVT (deep venous thrombosis) ([place]) 09/2016   left leg  . GERD (gastroesophageal reflux disease)   . H. pylori infection   . History of attempted suicide   . History of gastric polyp   . History of kidney stones    h/o  . Hyperhidrosis   . Migraines   . Protein C deficiency ([place])   . Rash   . Shift work sleep disorder     Current Outpatient Medications:  .  acetaminophen (TYLENOL) 500 MG tablet, Take 1,000 mg by mouth every 6 (six) hours as needed for moderate pain or headache., Disp: , Rfl:  .  baclofen (LIORESAL) 10 MG tablet, Take 10 mg by mouth 2 (two) times daily as needed (migraines). , Disp: ,  Rfl:  .  calcium carbonate (OS-CAL - DOSED IN MG OF ELEMENTAL CALCIUM) 1250 (500 Ca) MG tablet, Take 2 tablets by mouth daily., Disp: , Rfl:  .  chlorproMAZINE (THORAZINE) 25 MG tablet, Take 25 mg by mouth every 2 (two) hours as needed (migraines). , Disp: , Rfl:  .  clindamycin (CLEOCIN T) 1 % lotion, Apply 1 g topically daily. On face , Disp: , Rfl:  .  ELDERBERRY PO, Take 10 mLs by mouth daily., Disp: , Rfl:  .  ELIQUIS 5 MG TABS tablet, TAKE 1 TABLET BY MOUTH TWICE A DAY, Disp: 60 tablet, Rfl: 1 .  gabapentin (NEURONTIN) 600 MG tablet, Take 600 mg by mouth 2 (two) times daily. , Disp: , Rfl: 5 .  glycopyrrolate (ROBINUL) 1 MG tablet, Take 3 mg by mouth daily., Disp: , Rfl: 2 .  methylPREDNISolone (MEDROL DOSEPAK) 4 MG TBPK tablet, Use as directed, Disp: 1 each, Rfl: 0 .  tretinoin (RETIN-A) 0.025 % cream, Apply 1 g topically at bedtime., Disp: , Rfl:   Social History   Tobacco Use  Smoking Status Former Smoker  . Packs/day: 0.25  . Years: 5.00  . Pack years: 1.25  . Types: Cigarettes  . Start date: 05/15/2002  .  Quit date: 05/16/2007  . Years since quitting: 11.9  Smokeless Tobacco Never Used    Allergies  Allergen Reactions  . Topamax [Topiramate] Other (See Comments)    Confusion , slurred speech    Objective:  There were no vitals filed for this visit. There is no height or weight on file to calculate BMI. Constitutional Well developed. Well nourished.  Vascular Dorsalis pedis pulses palpable bilaterally. Posterior tibial pulses palpable bilaterally. Capillary refill normal to all digits.  No cyanosis or clubbing noted. Pedal hair growth normal.  Neurologic Normal speech. Oriented to person, place, and time. Epicritic sensation to light touch grossly present bilaterally.  Dermatologic Nails well groomed and normal in appearance. No open wounds. No skin lesions.  Orthopedic: Normal joint ROM without pain or crepitus bilaterally. No visible deformities. Tender to  palpation at the calcaneal tuber left No pain with calcaneal squeeze bilaterally. Ankle ROM diminished range of motion bilaterally. Silfverskiold Test: negative bilaterally.  Left Achilles tendinitis at the insertion.  No pain along the course of the tendon.  Pain on palpation at the Achilles insertion site.  Pain with dorsiflexion active and passive.   Radiographs: None  Assessment:   No diagnosis found. Plan:  Patient was evaluated and treated and all questions answered.  Plantar Fasciitis, left - XR reviewed as above.  -Re-educated on icing and stretching. Instructions given.  -I will hold off on injection at this time as they are no longer helping. - DME: Cam boot with steel toe covering.  I have also dispensed night splint to be worn at night when she is not wearing the cam boot. - Pharmacologic management: None   Flexible pes planus deformity -I explained to the patient the etiology of pes planus deformity with all the various treatment options associated with that including orthotics.  I discussed with patient the importance of orthotics as a long-term management to addressing the plantar fasciitis along with stretching. -She will be scheduled to see Liliane Channel for custom-made orthotics to help address the arches of her foot as well as control the hindfoot motion.  I would also like her to have a small heel lift on the left side to take the tension off the Achilles insertion.  Left Achilles tendinitis -The injection did give her some temporary relief, however it did not completely alleviate her pain.  However I think believe this is due to compensatory mechanism from protecting the heel pain and is aggravating the Achilles tendon.  I believe once heel pain is healed the pain from the Achilles tendinitis will resolve.  If not resolved I will treat that accordingly after treating the heel pain. -I will hold off on any injection to the left Achilles at this time.   Return for [employer] with  Liliane Channel for [employer].

## 2019-05-20 ENCOUNTER — Other Ambulatory Visit: Payer: Self-pay | Admitting: Hematology and Oncology

## 2019-05-27 ENCOUNTER — Encounter: Payer: Self-pay | Admitting: Podiatry

## 2019-05-27 ENCOUNTER — Ambulatory Visit (INDEPENDENT_AMBULATORY_CARE_PROVIDER_SITE_OTHER): Payer: 59 | Admitting: Podiatry

## 2019-05-27 ENCOUNTER — Other Ambulatory Visit: Payer: Self-pay

## 2019-05-27 DIAGNOSIS — M722 Plantar fascial fibromatosis: Secondary | ICD-10-CM

## 2019-05-27 DIAGNOSIS — M7662 Achilles tendinitis, left leg: Secondary | ICD-10-CM

## 2019-05-27 DIAGNOSIS — M7660 Achilles tendinitis, unspecified leg: Secondary | ICD-10-CM

## 2019-05-27 DIAGNOSIS — M79672 Pain in left foot: Secondary | ICD-10-CM

## 2019-05-28 ENCOUNTER — Telehealth: Payer: Self-pay

## 2019-05-28 ENCOUNTER — Encounter: Payer: Self-pay | Admitting: Podiatry

## 2019-05-28 NOTE — Telephone Encounter (Signed)
Copied from [place] [id number]. Topic: General - Inquiry >> May 28, 2019 11:47 AM Scherrie Gerlach wrote: Reason for CRM: pt states she has a thick white substance / discharge and would like to know if Dr Ancil Boozer will call in something in. Pt has appt 06/06/19, but declined to want sooner appt, and does not want to wait until then.  Please advise

## 2019-05-28 NOTE — Telephone Encounter (Signed)
Patient notified via Mychart.

## 2019-05-28 NOTE — Progress Notes (Signed)
Subjective:  Patient ID: Tammy Garza, female    DOB: [birth year],  MRN: [id number]  Chief Complaint  Patient presents with  . Nail Problem    pt is here for a f/u of the ingrown toenail of the left foot, pt states that the left foot is still hurting since the last time she was here, pt is concerned to go back to work and it still hurting.    39 y.o. female presents with the above complaint.  Patient presents with a follow-up of left plantar fasciitis as well as left Achilles tendinitis.  Patient states that her left plantar fasciitis has resolved occasionally she has mild pain with that however it appears the last injection I gave her has completely resolve the pain to the plantar fasciitis.  However her left Achilles tendinitis she still has pretty good amount of pain right at the insertion.  She states she has tried night splint icing stretching but none of that is helping.  The patient's pain scale is 7 out of 10.  Patient has been using the boot religiously however it helps when she is in the boot however she is not able to transition herself out of the boot.  She denies any other acute complaints.  Review of Systems: Negative except as noted in the HPI. Denies N/V/F/Ch.  Past Medical History:  Diagnosis Date  . Anemia    h/o  . Cervical high risk human papillomavirus (HPV) DNA test positive   . Chronic gastritis   . Depression   . DVT (deep venous thrombosis) ([place]) 09/2016   left leg  . GERD (gastroesophageal reflux disease)   . H. pylori infection   . History of attempted suicide   . History of gastric polyp   . History of kidney stones    h/o  . Hyperhidrosis   . Migraines   . Protein C deficiency ([place])   . Rash   . Shift work sleep disorder     Current Outpatient Medications:  .  acetaminophen (TYLENOL) 500 MG tablet, Take 1,000 mg by mouth every 6 (six) hours as needed for moderate pain or headache., Disp: , Rfl:  .  baclofen (LIORESAL) 10 MG tablet, Take 10 mg by  mouth 2 (two) times daily as needed (migraines). , Disp: , Rfl:  .  calcium carbonate (OS-CAL - DOSED IN MG OF ELEMENTAL CALCIUM) 1250 (500 Ca) MG tablet, Take 2 tablets by mouth daily., Disp: , Rfl:  .  chlorproMAZINE (THORAZINE) 25 MG tablet, Take 25 mg by mouth every 2 (two) hours as needed (migraines). , Disp: , Rfl:  .  clindamycin (CLEOCIN T) 1 % lotion, Apply 1 g topically daily. On face , Disp: , Rfl:  .  ELDERBERRY PO, Take 10 mLs by mouth daily., Disp: , Rfl:  .  ELIQUIS 5 MG TABS tablet, TAKE 1 TABLET BY MOUTH TWICE A DAY, Disp: 60 tablet, Rfl: 1 .  gabapentin (NEURONTIN) 600 MG tablet, Take 600 mg by mouth 2 (two) times daily. , Disp: , Rfl: 5 .  glycopyrrolate (ROBINUL) 1 MG tablet, Take 3 mg by mouth daily., Disp: , Rfl: 2 .  methylPREDNISolone (MEDROL DOSEPAK) 4 MG TBPK tablet, Use as directed, Disp: 1 each, Rfl: 0 .  tretinoin (RETIN-A) 0.025 % cream, Apply 1 g topically at bedtime., Disp: , Rfl:   Social History   Tobacco Use  Smoking Status Former Smoker  . Packs/day: 0.25  . Years: 5.00  . Pack years: 1.25  . Types:  Cigarettes  . Start date: 05/15/2002  . Quit date: 05/16/2007  . Years since quitting: 12.0  Smokeless Tobacco Never Used    Allergies  Allergen Reactions  . Topamax [Topiramate] Other (See Comments)    Confusion , slurred speech    Objective:  There were no vitals filed for this visit. There is no height or weight on file to calculate BMI. Constitutional Well developed. Well nourished.  Vascular Dorsalis pedis pulses palpable bilaterally. Posterior tibial pulses palpable bilaterally. Capillary refill normal to all digits.  No cyanosis or clubbing noted. Pedal hair growth normal.  Neurologic Normal speech. Oriented to person, place, and time. Epicritic sensation to light touch grossly present bilaterally.  Dermatologic Nails well groomed and normal in appearance. No open wounds. No skin lesions.  Orthopedic: Normal joint ROM without pain or  crepitus bilaterally. No visible deformities. Mild tender to palpation at the calcaneal tuber left however resolved No pain with calcaneal squeeze bilaterally. Ankle ROM diminished range of motion bilaterally. Silfverskiold Test: negative bilaterally.  Left Achilles tendinitis at the insertion.  No pain along the course of the tendon.  Pain on palpation at the Achilles insertion site.  Pain with dorsiflexion active and passive.   Radiographs: None  Assessment:   No diagnosis found. Plan:  Patient was evaluated and treated and all questions answered.  Plantar Fasciitis, left -Resolved  Flexible pes planus deformity -I explained to the patient the etiology of pes planus deformity with all the various treatment options associated with that including orthotics.  I discussed with patient the importance of orthotics as a long-term management to addressing the plantar fasciitis along with stretching. -She has an appointment to see Liliane Channel on June 04, 2019  Left Achilles tendinitis -Given that after the improvement of plantar fasciitis her Achilles tendinitis is still continuously hurting I believe she will benefit from a steroid injection in the kager fat pad.  I believe she will also benefit from an MRI to evaluate the Achilles tendon insertion as well as the heel itself to see if there is any longitudinal tearing or insertional tearing.  Patient agrees with the plan and would like to proceed with the injection -A steroid injection was performed at [place] fat pad left using 1% plain Lidocaine and 10 mg of Kenalog. This was well tolerated. -Weightbearing as tolerated with a cam boot -MRI of the left heel will be ordered.   Return in about 4 weeks (around 06/24/2019).

## 2019-06-04 ENCOUNTER — Other Ambulatory Visit: Payer: Self-pay

## 2019-06-04 ENCOUNTER — Ambulatory Visit (INDEPENDENT_AMBULATORY_CARE_PROVIDER_SITE_OTHER): Payer: 59 | Admitting: Orthotics

## 2019-06-04 DIAGNOSIS — M2141 Flat foot [pes planus] (acquired), right foot: Secondary | ICD-10-CM

## 2019-06-04 DIAGNOSIS — M722 Plantar fascial fibromatosis: Secondary | ICD-10-CM

## 2019-06-04 DIAGNOSIS — M7662 Achilles tendinitis, left leg: Secondary | ICD-10-CM

## 2019-06-04 DIAGNOSIS — M2142 Flat foot [pes planus] (acquired), left foot: Secondary | ICD-10-CM

## 2019-06-04 NOTE — Progress Notes (Signed)

## 2019-06-05 ENCOUNTER — Telehealth: Payer: Self-pay

## 2019-06-05 DIAGNOSIS — M7662 Achilles tendinitis, left leg: Secondary | ICD-10-CM

## 2019-06-05 NOTE — Telephone Encounter (Signed)
Per [facility], no precert is required for MRI--reference # [id number] Patient has been notified and she will call to schedule appt to her convenience

## 2019-06-05 NOTE — Telephone Encounter (Signed)
-----   Message from Felipa Furnace, DPM sent at 05/28/2019  4:12 PM EST ----- Regarding: MRI left heel/Achilles tendinitis Hi [name],  Sorry the previous message that I sent I forgot to attach the patient to it.  This is a patient I meant to send it for.  She will need MRI of the left heel versus ankle depending on which shows me the insertion of the Achilles tendon and tearing along the Achilles.  Thanks Lennette Bihari

## 2019-06-06 ENCOUNTER — Ambulatory Visit (INDEPENDENT_AMBULATORY_CARE_PROVIDER_SITE_OTHER): Payer: 59 | Admitting: Family Medicine

## 2019-06-06 ENCOUNTER — Other Ambulatory Visit: Payer: Self-pay

## 2019-06-06 ENCOUNTER — Encounter: Payer: Self-pay | Admitting: Family Medicine

## 2019-06-06 VITALS — BP 122/78 | HR 71 | Ht 66.0 in | Wt 178.4 lb

## 2019-06-06 DIAGNOSIS — F410 Panic disorder [episodic paroxysmal anxiety] without agoraphobia: Secondary | ICD-10-CM

## 2019-06-06 DIAGNOSIS — R232 Flushing: Secondary | ICD-10-CM

## 2019-06-06 DIAGNOSIS — M722 Plantar fascial fibromatosis: Secondary | ICD-10-CM

## 2019-06-06 DIAGNOSIS — D6859 Other primary thrombophilia: Secondary | ICD-10-CM | POA: Diagnosis not present

## 2019-06-06 DIAGNOSIS — G43109 Migraine with aura, not intractable, without status migrainosus: Secondary | ICD-10-CM | POA: Diagnosis not present

## 2019-06-06 DIAGNOSIS — I825Z2 Chronic embolism and thrombosis of unspecified deep veins of left distal lower extremity: Secondary | ICD-10-CM | POA: Diagnosis not present

## 2019-06-06 DIAGNOSIS — D472 Monoclonal gammopathy: Secondary | ICD-10-CM

## 2019-06-06 DIAGNOSIS — E559 Vitamin D deficiency, unspecified: Secondary | ICD-10-CM

## 2019-06-06 MED ORDER — PAROXETINE HCL 10 MG PO TABS: 10.0000 mg | ORAL_TABLET | Freq: Every day | ORAL | 0 refills | Status: DC

## 2019-06-06 MED ORDER — HYDROXYZINE HCL 10 MG PO TABS: 10.0000 mg | ORAL_TABLET | Freq: Every day | ORAL | 0 refills | Status: DC

## 2019-06-06 NOTE — Progress Notes (Signed)
Name: Tammy Garza   MRN: [id number]    DOB: 12-26-1980   Date:06/06/2019       Progress Note  Subjective  Chief Complaint  Chief Complaint  Patient presents with  . Medication Refill    6 month F/U  . Protein C deficiency  . Migraine headaches    Unchanged- still seeing Tammy Garza for treatment  . Plantar Fasciitis    Wearing a boot and seeing specialist since then having anxiety attacks-waking up at 3 a.m. and can't go back to sleep and excessive sweating    I connected with  Tammy Garza  on 06/06/19 at  9:20 AM EST by a video enabled telemedicine application and verified that I am speaking with the correct person using two identifiers.  I discussed the limitations of evaluation and management by telemedicine and the availability of in person appointments. The patient expressed understanding and agreed to proceed. Staff also discussed with the patient that there may be a patient responsible charge related to this service. Patient Location: in the car as a passenger.  Provider Location: [facility]   HPI  Anemia : she had a low hemoglobin back in October, but back to normal Dec 2020, likely from blood loss during procedure anal skin tag removal.  MGUS: she is seeing Tammy Garza, last protein level and CBC were normal  Plantar Fascitis: on left side, seeing podiatrist, Tammy Garza. She stands and walks a lot at work, she has to wear steel toe shoes. She states symptoms started after she switch positions at work from an area that had wood floors to concrete floors. She had steroid injection, splinting at night, boot during the day, and has insoles made and will go for an MRI soon. Pain is towards her heel, it is constant and radiates to her achilles tendon, swelling has resolved from the arch of her foot. Pain is described as aching, but worse with palpation or standing/walking.   Protein C deficiency and history of DVT: still under the care of Tammy Garza, taking  anticoagulants, and no side effects  Migraine headaches:under the care of Tammy Garza,  she is taking gabapentin since 2018, episodes down to about 3  times per month.She has associated nausea, photophobia and phonophobia. Pain is described as temporal throbbing like.    Thyroid cyst:Us was reassuring no need for biopsy or repeat study. She denies dysphagia, cannot feel any lumps on her neck   Acne: she is now seeing Tammy Garza, using topical medication and it is helping with symptoms , face is clear at this time  Anxiety: she states lately she has noticed anxiety, she wakes up at night sweating ( s/p hysterectomy) has difficulty falling back asleep and has to go around the house cleaning. She states also feels very anxious when she looks into her daughter's room and is all messy, it causes her to hyperventilate and her heart races. She usually works third shift but has not been at work for the past 2 months due to plantar fascitis . Discussed options. She has taken lexapro many years ago for depression and it made her feel worse, she states not feeling depressed now, just anxious. We will try paroxetine, and add hydroxizine at night to help with sleep. Also advised light clothing at night, fan if needed, eat healthy   Patient Active Problem List   Diagnosis Date Noted  . Open wound 03/06/2019  . Anal skin tag   . Grade II hemorrhoids   .  Hypocalcemia 10/13/2018  . Internal hemorrhoid 01/22/2018  . Protein C deficiency ([place]) 12/10/2016  . Monoclonal gammopathy of unknown significance ([facility]US) 11/30/2016  . History of DVT (deep vein thrombosis) 11/02/2016  . Elevated blood protein 09/26/2016  . Chronic deep vein thrombosis (DVT) of distal vein of left lower extremity ([place]) 09/17/2016  . Vitamin D deficiency 07/26/2015  . Thyroid cyst 07/26/2015  . History of Helicobacter pylori infection 04/22/2015  . Migraine with aura and without status migrainosus 04/22/2015  . Shift work sleep disorder  04/22/2015    Past Surgical History:  Procedure Laterality Date  . ABDOMINAL HYSTERECTOMY    . ANTERIOR CRUCIATE LIGAMENT REPAIR Left   . CHOLECYSTECTOMY    . CYSTOSCOPY  12/19/2016   Procedure: CYSTOSCOPY;  Surgeon: Gae Dry, MD;  Location: [facility] ORS;  Service: Gynecology;;  . ESOPHAGOGASTRODUODENOSCOPY (EGD) WITH PROPOFOL N/A 09/25/2017   Procedure: ESOPHAGOGASTRODUODENOSCOPY (EGD) WITH PROPOFOL;  Surgeon: Jonathon Bellows, MD;  Location: [facility]COPY;  Service: Gastroenterology;  Laterality: N/A;  . LAPAROSCOPIC HYSTERECTOMY Bilateral 12/19/2016   Procedure: HYSTERECTOMY TOTAL LAPAROSCOPIC BILATERAL SALPINGECTOMY;  Surgeon: Gae Dry, MD;  Location: [facility] ORS;  Service: Gynecology;  Laterality: Bilateral;  . RECTAL EXAM UNDER ANESTHESIA N/A 02/26/2019   Procedure: RECTAL EXAM UNDER ANESTHESIA, EXCISION OF ANAL SKIN TAG;  Surgeon: Fredirick Maudlin, MD;  Location: [facility] ORS;  Service: General;  Laterality: N/A;  . TUBAL LIGATION      Family History  Problem Relation Age of Onset  . HIV Sister   . Cancer Sister 47       cervical cancer  . Asthma Daughter   . Protein C deficiency Daughter   . Hypertension Maternal Grandmother   . Hypertension Mother   . Obesity Mother   . Cancer Father 30       lung cancer  . Deep vein thrombosis Father   . Protein C deficiency Father   . COPD Father   . Congestive Heart Failure Father   . Diabetes Brother   . COPD Paternal Grandmother   . Deep vein thrombosis Paternal Grandmother   . Protein C deficiency Daughter       Current Outpatient Medications:  .  acetaminophen (TYLENOL) 500 MG tablet, Take 1,000 mg by mouth every 6 (six) hours as needed for moderate pain or headache., Disp: , Rfl:  .  baclofen (LIORESAL) 10 MG tablet, Take 10 mg by mouth 2 (two) times daily as needed (migraines). , Disp: , Rfl:  .  calcium carbonate (OS-CAL - DOSED IN MG OF ELEMENTAL CALCIUM) 1250 (500 Ca) MG tablet, Take 2 tablets by mouth daily., Disp: ,  Rfl:  .  chlorproMAZINE (THORAZINE) 25 MG tablet, Take 25 mg by mouth every 2 (two) hours as needed (migraines). , Disp: , Rfl:  .  clindamycin (CLEOCIN T) 1 % lotion, Apply 1 g topically daily. On face , Disp: , Rfl:  .  ELDERBERRY PO, Take 10 mLs by mouth daily., Disp: , Rfl:  .  ELIQUIS 5 MG TABS tablet, TAKE 1 TABLET BY MOUTH TWICE A DAY, Disp: 60 tablet, Rfl: 1 .  gabapentin (NEURONTIN) 600 MG tablet, Take 600 mg by mouth 2 (two) times daily. , Disp: , Rfl: 5 .  glycopyrrolate (ROBINUL) 1 MG tablet, Take 3 mg by mouth daily., Disp: , Rfl: 2 .  tretinoin (RETIN-A) 0.025 % cream, Apply 1 g topically at bedtime., Disp: , Rfl:   Allergies  Allergen Reactions  . Topamax [Topiramate] Other (See Comments)  Confusion , slurred speech     I personally reviewed active problem list, medication list, allergies, family history, social history, health maintenance with the patient/caregiver today.   ROS  Ten systems reviewed and is negative except as mentioned in HPI   Objective  Virtual encounter, vitals not obtained.  Body mass index is 28.79 kg/m.  Physical Exam  Awake, alert and oriented   PHQ2/9: Depression screen [facility] 2/9 06/06/2019 12/02/2018 09/06/2018 02/20/2018 08/20/2017  Decreased Interest 0 0 0 0 0  Down, Depressed, Hopeless 0 0 0 1 0  PHQ - 2 Score 0 0 0 1 0  Altered sleeping 3 0 2 0 -  Tired, decreased energy 0 0 1 0 -  Change in appetite 0 0 0 0 -  Feeling bad or failure about yourself  0 0 0 0 -  Trouble concentrating 0 0 0 0 -  Moving slowly or fidgety/restless 0 0 0 0 -  Suicidal thoughts 0 0 0 0 -  PHQ-9 Score 3 0 3 1 -  Difficult doing work/chores Not difficult at all - Not difficult at all Not difficult at all -   PHQ-2/9 Result is negative.  She works third shift   Fall Risk: Fall Risk  06/06/2019 12/11/2018 12/02/2018 09/06/2018 06/06/2018  Falls in the past year? 0 0 0 1 0  Number falls in past yr: 0 0 0 0 0  Injury with Fall? 0 0 0 1 0  Comment - - -  Right Elbow -     Assessment & Plan  1. Protein C deficiency ([place])  Continue follow up with Tammy Garza   2. Migraine with aura and without status migrainosus, not intractable  stable  3. Chronic deep vein thrombosis (DVT) of distal vein of left lower extremity (HCC)  On anticoagulation   4. Vitamin D deficiency   5. Monoclonal gammopathy of unknown significance ([facility])  Last labs were normal   6. Plantar fasciitis, left  Under the care of podiatrist  7. Hot flashes  - PARoxetine (PAXIL) 10 MG tablet; Take 1 tablet (10 mg total) by mouth daily.  Dispense: 30 tablet; Refill: 0  8. Panic attacks  - PARoxetine (PAXIL) 10 MG tablet; Take 1 tablet (10 mg total) by mouth daily.  Dispense: 30 tablet; Refill: 0 - hydrOXYzine (ATARAX/VISTARIL) 10 MG tablet; Take 1-2 tablets (10-20 mg total) by mouth at bedtime.  Dispense: 60 tablet; Refill: 0  I discussed the assessment and treatment plan with the patient. The patient was provided an opportunity to ask questions and all were answered. The patient agreed with the plan and demonstrated an understanding of the instructions.  The patient was advised to call back or seek an in-person evaluation if the symptoms worsen or if the condition fails to improve as anticipated.  I provided 25  minutes of non-face-to-face time during this encounter.

## 2019-06-12 ENCOUNTER — Other Ambulatory Visit: Payer: Self-pay

## 2019-06-12 ENCOUNTER — Ambulatory Visit
Admission: RE | Admit: 2019-06-12 | Discharge: 2019-06-12 | Disposition: A | Discharge status: Home / Self Care | Payer: 59 | Source: Ambulatory Visit | Attending: Podiatry | Admitting: Podiatry

## 2019-06-12 DIAGNOSIS — M7662 Achilles tendinitis, left leg: Secondary | ICD-10-CM | POA: Diagnosis present

## 2019-06-13 ENCOUNTER — Encounter: Payer: Self-pay | Admitting: *Deleted

## 2019-06-16 HISTORY — PX: PLANTAR FASCIA SURGERY: SHX746

## 2019-06-20 ENCOUNTER — Ambulatory Visit: Payer: 59 | Admitting: Orthotics

## 2019-06-20 ENCOUNTER — Other Ambulatory Visit: Payer: Self-pay

## 2019-06-20 DIAGNOSIS — M2141 Flat foot [pes planus] (acquired), right foot: Secondary | ICD-10-CM

## 2019-06-20 DIAGNOSIS — M2142 Flat foot [pes planus] (acquired), left foot: Secondary | ICD-10-CM

## 2019-06-20 DIAGNOSIS — M722 Plantar fascial fibromatosis: Secondary | ICD-10-CM

## 2019-06-20 DIAGNOSIS — M7662 Achilles tendinitis, left leg: Secondary | ICD-10-CM

## 2019-06-21 ENCOUNTER — Encounter: Payer: Self-pay | Admitting: Podiatry

## 2019-06-23 NOTE — Telephone Encounter (Signed)
I called patient to discuss symptoms and left message for patient to call back.  She has follow up appt with Dr. Posey Pronto tomorrow.

## 2019-06-23 NOTE — Telephone Encounter (Signed)
I spoke with patient, she stated that she has been resting foot over the weekend and bought a compression sock which has helped the most with the pain.  She stated that her heel still has the "pins and needles" pain, but it is a lot better than before and the swelling has gone down.  She did take a Hydrocodone to help her rest.  I instructed her to continue resting, elevate, use heat when needed and Tylenol or Pain medication at night and we will see her tomorrow at scheduled appointment

## 2019-06-24 ENCOUNTER — Other Ambulatory Visit: Payer: Self-pay

## 2019-06-24 ENCOUNTER — Encounter: Payer: Self-pay | Admitting: *Deleted

## 2019-06-24 ENCOUNTER — Ambulatory Visit: Payer: 59 | Admitting: Podiatry

## 2019-06-24 DIAGNOSIS — M21862 Other specified acquired deformities of left lower leg: Secondary | ICD-10-CM

## 2019-06-24 DIAGNOSIS — M2141 Flat foot [pes planus] (acquired), right foot: Secondary | ICD-10-CM | POA: Diagnosis not present

## 2019-06-24 DIAGNOSIS — M7662 Achilles tendinitis, left leg: Secondary | ICD-10-CM | POA: Diagnosis not present

## 2019-06-24 DIAGNOSIS — M216X2 Other acquired deformities of left foot: Secondary | ICD-10-CM | POA: Diagnosis not present

## 2019-06-24 DIAGNOSIS — M722 Plantar fascial fibromatosis: Secondary | ICD-10-CM | POA: Diagnosis not present

## 2019-06-24 DIAGNOSIS — M2142 Flat foot [pes planus] (acquired), left foot: Secondary | ICD-10-CM

## 2019-06-24 DIAGNOSIS — Z01818 Encounter for other preprocedural examination: Secondary | ICD-10-CM

## 2019-06-24 NOTE — Patient Instructions (Signed)
Pre-Operative Instructions  Congratulations, you have decided to take an important step towards improving your quality of life.  You can be assured that the doctors and staff at [facility] will be with you every step of the way.  Here are some important things you should know:  1. Plan to be at the surgery center/hospital at least 1 (one) hour prior to your scheduled time, unless otherwise directed by the surgical center/hospital staff.  You must have a responsible adult accompany you, remain during the surgery and drive you home.  Make sure you have directions to the surgical center/hospital to ensure you arrive on time. 2. If you are having surgery at [facility] or [facility] hospitals, you will need a copy of your medical history and physical form from your family physician within one month prior to the date of surgery. We will give you a form for your primary physician to complete.  3. We make every effort to accommodate the date you request for surgery.  However, there are times where surgery dates or times have to be moved.  We will contact you as soon as possible if a change in schedule is required.   4. No aspirin/ibuprofen for one week before surgery.  If you are on aspirin, any non-steroidal anti-inflammatory medications (Mobic, Aleve, Ibuprofen) should not be taken seven (7) days prior to your surgery.  You make take Tylenol for pain prior to surgery.  5. Medications - If you are taking daily heart and blood pressure medications, seizure, reflux, allergy, asthma, anxiety, pain or diabetes medications, make sure you notify the surgery center/hospital before the day of surgery so they can tell you which medications you should take or avoid the day of surgery. 6. No food or drink after midnight the night before surgery unless directed otherwise by surgical center/hospital staff. 7. No alcoholic beverages 24-hours prior to surgery.  No smoking 24-hours prior or 24-hours after  surgery. 8. Wear loose pants or shorts. They should be loose enough to fit over bandages, boots, and casts. 9. Don't wear slip-on shoes. Sneakers are preferred. 10. Bring your boot with you to the surgery center/hospital.  Also bring crutches or a walker if your physician has prescribed it for you.  If you do not have this equipment, it will be provided for you after surgery. 11. If you have not been contacted by the surgery center/hospital by the day before your surgery, call to confirm the date and time of your surgery. 12. Leave-time from work may vary depending on the type of surgery you have.  Appropriate arrangements should be made prior to surgery with your employer. 13. Prescriptions will be provided immediately following surgery by your doctor.  Fill these as soon as possible after surgery and take the medication as directed. Pain medications will not be refilled on weekends and must be approved by the doctor. 14. Remove nail polish on the operative foot and avoid getting pedicures prior to surgery. 15. Wash the night before surgery.  The night before surgery wash the foot and leg well with water and the antibacterial soap provided. Be sure to pay special attention to beneath the toenails and in between the toes.  Wash for at least three (3) minutes. Rinse thoroughly with water and dry well with a towel.  Perform this wash unless told not to do so by your physician.  Enclosed: 1 Ice pack (please put in freezer the night before surgery)   1 Hibiclens skin cleaner     Pre-op instructions  If you have any questions regarding the instructions, please do not hesitate to call our office.  [place]: [address], [place], [place] [address] -- [phone]  [place]: [address] [place], [place] [address] -- [phone]  [place]: [address], [place], [place] [address] -- [phone]   Website: [email or web address] 

## 2019-06-25 ENCOUNTER — Encounter: Payer: Self-pay | Admitting: Podiatry

## 2019-06-25 NOTE — Progress Notes (Signed)
Subjective:  Patient ID: Tammy Garza, female    DOB: 11/13/1980,  MRN: [id number]  Chief Complaint  Patient presents with  . Plantar Fasciitis    pt is here for 4 week plantar fasciitis f/u, pt states that she os still feeling pain since the last time she was here, pt states that the injections has not been working. But is looking to get results back on the MRI    39 y.o. female presents with the above complaint.  Patient presents with continuous pain to the left Achilles tendon insertion as well as her pain at the left plantar fascia.  Her pain at the left plantar fascia has recurred.  It was improving however it has now gotten worse again.  She tried stretching it but she still has a feeling of pins-and-needles in the bottom of the foot.  She has received orthotics and has been wearing them which has been helping but it still has not resolved her pain.  Patient states the injection has not been helping at all.  Patient also had an MRI done which was reviewed by me at today's visit.  Pain scale is 7 out of 10.  She has failed all conservative therapy at this time.  She would like to know if there is any surgical intervention that could be done given that she has tried all conservative therapy and has failed them.  Review of Systems: Negative except as noted in the HPI. Denies N/V/F/Ch.  Past Medical History:  Diagnosis Date  . Anemia    h/o  . Cervical high risk human papillomavirus (HPV) DNA test positive   . Chronic gastritis   . Depression   . DVT (deep venous thrombosis) ([place]) 09/2016   left leg  . GERD (gastroesophageal reflux disease)   . H. pylori infection   . History of attempted suicide   . History of gastric polyp   . History of kidney stones    h/o  . Hyperhidrosis   . Migraines   . Protein C deficiency ([place])   . Rash   . Shift work sleep disorder     Current Outpatient Medications:  .  baclofen (LIORESAL) 10 MG tablet, Take 10 mg by mouth 2 (two) times daily as  needed (migraines). , Disp: , Rfl:  .  chlorproMAZINE (THORAZINE) 25 MG tablet, Take 25 mg by mouth every 2 (two) hours as needed (migraines). , Disp: , Rfl:  .  clindamycin (CLEOCIN T) 1 % lotion, Apply 1 g topically daily. On face , Disp: , Rfl:  .  ELDERBERRY PO, Take 10 mLs by mouth daily., Disp: , Rfl:  .  ELIQUIS 5 MG TABS tablet, TAKE 1 TABLET BY MOUTH TWICE A DAY, Disp: 60 tablet, Rfl: 1 .  gabapentin (NEURONTIN) 600 MG tablet, Take 600 mg by mouth 2 (two) times daily. , Disp: , Rfl: 5 .  glycopyrrolate (ROBINUL) 1 MG tablet, Take 3 mg by mouth daily., Disp: , Rfl: 2 .  hydrOXYzine (ATARAX/VISTARIL) 10 MG tablet, Take 1-2 tablets (10-20 mg total) by mouth at bedtime., Disp: 60 tablet, Rfl: 0 .  PARoxetine (PAXIL) 10 MG tablet, Take 1 tablet (10 mg total) by mouth daily., Disp: 30 tablet, Rfl: 0 .  tretinoin (RETIN-A) 0.025 % cream, Apply 1 g topically at bedtime., Disp: , Rfl:  .  acetaminophen (TYLENOL) 500 MG tablet, Take 1,000 mg by mouth every 6 (six) hours as needed for moderate pain or headache., Disp: , Rfl:  .  calcium  carbonate (OS-CAL - DOSED IN MG OF ELEMENTAL CALCIUM) 1250 (500 Ca) MG tablet, Take 2 tablets by mouth daily., Disp: , Rfl:   Social History   Tobacco Use  Smoking Status Former Smoker  . Packs/day: 0.25  . Years: 5.00  . Pack years: 1.25  . Types: Cigarettes  . Start date: 05/15/2002  . Quit date: 05/16/2007  . Years since quitting: 12.1  Smokeless Tobacco Never Used    Allergies  Allergen Reactions  . Topamax [Topiramate] Other (See Comments)    Confusion , slurred speech    Objective:  There were no vitals filed for this visit. There is no height or weight on file to calculate BMI. Constitutional Well developed. Well nourished.  Vascular Dorsalis pedis pulses palpable bilaterally. Posterior tibial pulses palpable bilaterally. Capillary refill normal to all digits.  No cyanosis or clubbing noted. Pedal hair growth normal.  Neurologic Normal  speech. Oriented to person, place, and time. Epicritic sensation to light touch grossly present bilaterally.  Dermatologic Nails well groomed and normal in appearance. No open wounds. No skin lesions.  Orthopedic: Normal joint ROM without pain or crepitus bilaterally. No visible deformities. There is tenderness to the palpation of the calcaneal tuber goal and has recurred. No pain with calcaneal squeeze bilaterally. Ankle ROM diminished range of motion bilaterally. Silfverskiold Test: Positive Silfverskiold test noted with gastroc equinus.  Left Achilles tendinitis at the insertion.  No pain along the course of the tendon.  Pain on palpation at the Achilles insertion site.  Pain with dorsiflexion active and passive.   Radiographs: None  Normal appearing Achilles tendon and plantar fascia. Trace amount of fluid the retrocalcaneal bursa noted.  Small os trigonum with mild edema within the os and some fluid in the posterior subtalar recess suggestive of os trigonum syndrome.   Assessment:   1. Achilles tendinitis, left leg   2. Plantar fasciitis of left foot   3. Pes planus of both feet   4. Gastrocnemius equinus of left lower extremity   5. Preop examination    Plan:  Patient was evaluated and treated and all questions answered.  Plantar Fasciitis, left -Recurred.  Patient failed stretching exercises, orthotics, injections and at this time all conservative therapy patient would like to schedule for surgery for endoscopic plantar fasciotomy release.  I explained the surgical procedure in great detail and have made no promises as to the outcome of the procedure.  However I believe that this will be beneficial as all conservative therapies have failed.  Even though MRI was negative for plantar fasciitis, I will treat this clinically as her pain and symptoms are very correlated with plantar fasciitis.  Patient states understanding would like to proceed with the surgery  Flexible pes  planus deformity -Patient is obtain orthotics to help control the flexible nature of the pes planus deformity.  Patient states the orthotics have been functioning well.  I have instructed her to continue using them for the rest of her life.  If the orthotics do wear out in the future come back and see [place] as they can be refurbished or replaced.  Left Achilles tendinitis/gastroc equinus -MRI has been reviewed with the patient in great details.  MRI was negative for any kind of For any longitudinal tearing or inflamed nation at the Achilles or plantar fascia.  Of the leaves given that the patient has a gastroc equinus I believe that by performing a gastroc recession this can help decrease the stress at the insertion of  the Achilles and give her some relief.  At this time I will hold off on any Achilles tendon work-up if she is able to get relief from gastroc recession. -Patient will be weightbearing as tolerated after the procedure in a cam boot. -I plan on performing left endoscopic plantar fasciotomy with gastrocnemius recession  -Informed surgical risk consent was reviewed and read aloud to the patient.  I reviewed the films.  I have discussed my findings with the patient in great detail.  I have discussed all risks including but not limited to infection, stiffness, scarring, limp, disability, deformity, damage to blood vessels and nerves, numbness, poor healing, need for braces, arthritis, chronic pain, amputation, death.  All benefits and realistic expectations discussed in great detail.  I have made no promises as to the outcome.  I have provided realistic expectations.  I have offered the patient a 2nd opinion, which they have declined and assured me they preferred to proceed despite the risks    No follow-ups on file.

## 2019-06-28 ENCOUNTER — Other Ambulatory Visit: Payer: Self-pay | Admitting: Family Medicine

## 2019-06-28 DIAGNOSIS — F410 Panic disorder [episodic paroxysmal anxiety] without agoraphobia: Secondary | ICD-10-CM

## 2019-06-28 DIAGNOSIS — R232 Flushing: Secondary | ICD-10-CM

## 2019-06-28 NOTE — Telephone Encounter (Signed)
Pt wanting 90 day refill on medications. Pt has upcoming visit this week. Please review.

## 2019-06-30 ENCOUNTER — Telehealth: Payer: Self-pay

## 2019-06-30 NOTE — Telephone Encounter (Addendum)
DOS 07/07/2019 EPF LEFT - [id number] GASTROCNEMIUS LEFT - [id number]  [employer] EFFECTIVE DATE - 05/16/19  PLAN DEDUCTIBLE - $0.00  OUT OF POCKET - $500.00 - $449.00 REMAINING  COPAY - $125.00  CO-INSURANCE - 0%  NOTIFICATION/PRIOR AUTHORIZATION NUMBER CASE STATUS CASE STATUS REASON PRIMARY CARE PHYSICIAN [id number] Closed Case Was Managed And Is Now Complete - ADVANCE NOTIFY DATE/TIME ADMISSION NOTIFY DATE/TIME 06/30/2019 02:32 PM CST - COVERAGE STATUS OVERALL COVERAGE STATUS Covered/Approved 1-2 CODE DESCRIPTION COVERAGE STATUS DECISION DATE FAC [facility] Coverage determination is reflected for the facility admission and is not a guarantee of payment for ongoing services. Covered/Approved 06/30/2019 1 [id number] Gastrocnemius recession (eg, [place] pro more Covered/Approved 06/30/2019 2 [id number] Endoscopic plantar fasciotomy Covered/Approved

## 2019-07-02 ENCOUNTER — Ambulatory Visit (INDEPENDENT_AMBULATORY_CARE_PROVIDER_SITE_OTHER): Payer: 59 | Admitting: Family Medicine

## 2019-07-02 ENCOUNTER — Other Ambulatory Visit: Payer: Self-pay | Admitting: Family Medicine

## 2019-07-02 ENCOUNTER — Encounter: Payer: Self-pay | Admitting: Family Medicine

## 2019-07-02 ENCOUNTER — Encounter: Payer: Self-pay | Admitting: Hematology and Oncology

## 2019-07-02 VITALS — BP 125/80 | HR 75 | Ht 66.0 in | Wt 192.0 lb

## 2019-07-02 DIAGNOSIS — F410 Panic disorder [episodic paroxysmal anxiety] without agoraphobia: Secondary | ICD-10-CM

## 2019-07-02 DIAGNOSIS — R232 Flushing: Secondary | ICD-10-CM

## 2019-07-02 MED ORDER — VENLAFAXINE HCL ER 37.5 MG PO CP24: 37.5000 mg | ORAL_CAPSULE | Freq: Every day | ORAL | 0 refills | Status: DC

## 2019-07-02 NOTE — Patient Instructions (Signed)
Overlap paxil ( paroxitine 10 mg)  with effexor ( venlafaxine )  37.5 for about one week, after that stop paxil and go up on effexor to 75 mg daily  ( 2 capsules )

## 2019-07-02 NOTE — Progress Notes (Signed)
Name: Tammy Garza   MRN: [id number]    DOB: 02/07/1981   Date:07/02/2019       Progress Note  Subjective  Chief Complaint  Chief Complaint  Patient presents with  . Anxiety    Increase in weight, increase in anxiety  . Hot Flashes    Around night time-at least once daily    I connected with  [name] on 07/02/19 at 10:40 AM EST by telephone and verified that I am speaking with the correct person using two identifiers.  I discussed the limitations, risks, security and privacy concerns of performing an evaluation and management service by telephone and the availability of in person appointments. Staff also discussed with the patient that there may be a patient responsible charge related to this service. Patient Location: at home  Provider Location: [facility]   HPI   Anxiety: she states lately she has noticed anxiety, she wakes up at night sweating ( s/p hysterectomy) has difficulty falling back asleep and has to go around the house cleaning. She states also feels very anxious when she looks into her daughter's room and is all messy, it causes her to hyperventilate and her heart races. She usually works third shift but has not been at work for the past 2 months due to plantar fascitis . Discussed options. She has taken lexapro many years ago for depression and it made her feel worse, she states not feeling depressed now, just anxious. We gave her paroxetine 10 mg daily and has helped her anxiety , she notices that she can feel more anxious towards the time the next dose is due , hot flashes are still happening in the middle of the night and sometimes during the day. She has been sleeping better and able to fall back asleep is she wakes up during the night. She has only been taking Hydroxyzine prn at night.  Discussed switching to Effexor   Patient Active Problem List   Diagnosis Date Noted  . Open wound 03/06/2019  . Anal skin tag   . Grade II hemorrhoids   .  Hypocalcemia 10/13/2018  . Internal hemorrhoid 01/22/2018  . Protein C deficiency ([place]) 12/10/2016  . Monoclonal gammopathy of unknown significance (MGUS) 11/30/2016  . History of DVT (deep vein thrombosis) 11/02/2016  . Elevated blood protein 09/26/2016  . Chronic deep vein thrombosis (DVT) of distal vein of left lower extremity ([place]) 09/17/2016  . Vitamin D deficiency 07/26/2015  . Thyroid cyst 07/26/2015  . History of Helicobacter pylori infection 04/22/2015  . Migraine with aura and without status migrainosus 04/22/2015  . Shift work sleep disorder 04/22/2015    Past Surgical History:  Procedure Laterality Date  . ABDOMINAL HYSTERECTOMY    . ANTERIOR CRUCIATE LIGAMENT REPAIR Left   . CHOLECYSTECTOMY    . CYSTOSCOPY  12/19/2016   Procedure: CYSTOSCOPY;  Surgeon: Gae Dry, MD;  Location: [facility] ORS;  Service: Gynecology;;  . ESOPHAGOGASTRODUODENOSCOPY (EGD) WITH PROPOFOL N/A 09/25/2017   Procedure: ESOPHAGOGASTRODUODENOSCOPY (EGD) WITH PROPOFOL;  Surgeon: Jonathon Bellows, MD;  Location: [facility] END[facility]COPY;  Service: Gastroenterology;  Laterality: N/A;  . LAPAROSCOPIC HYSTERECTOMY Bilateral 12/19/2016   Procedure: HYSTERECTOMY TOTAL LAPAROSCOPIC BILATERAL SALPINGECTOMY;  Surgeon: Gae Dry, MD;  Location: [facility] ORS;  Service: Gynecology;  Laterality: Bilateral;  . RECTAL EXAM UNDER ANESTHESIA N/A 02/26/2019   Procedure: RECTAL EXAM UNDER ANESTHESIA, EXCISION OF ANAL SKIN TAG;  Surgeon: Fredirick Maudlin, MD;  Location: [facility] ORS;  Service: General;  Laterality: N/A;  . TUBAL  LIGATION      Family History  Problem Relation Age of Onset  . HIV Sister   . Cancer Sister 40       cervical cancer  . Asthma Daughter   . Protein C deficiency Daughter   . Hypertension Maternal Grandmother   . Hypertension Mother   . Obesity Mother   . Cancer Father 49       lung cancer  . Deep vein thrombosis Father   . Protein C deficiency Father   . COPD Father   . Congestive Heart Failure Father     . Diabetes Brother   . COPD Paternal Grandmother   . Deep vein thrombosis Paternal Grandmother   . Protein C deficiency Daughter       Tobacco Use: Medium Risk  . Smoking Tobacco Use: Former Smoker  . Smokeless Tobacco Use: Never Used   Social History   Substance and Sexual Activity  Alcohol Use Yes  . Alcohol/week: 1.0 standard drinks  . Types: 1 Glasses of wine per week   Comment: occ wine none last 24 hrs    Current Outpatient Medications:  .  baclofen (LIORESAL) 10 MG tablet, Take 10 mg by mouth 2 (two) times daily as needed (migraines). , Disp: , Rfl:  .  chlorproMAZINE (THORAZINE) 25 MG tablet, Take 25 mg by mouth every 2 (two) hours as needed (migraines). , Disp: , Rfl:  .  clindamycin (CLEOCIN T) 1 % lotion, Apply 1 g topically daily. On face , Disp: , Rfl:  .  ELDERBERRY PO, Take 10 mLs by mouth daily., Disp: , Rfl:  .  ELIQUIS 5 MG TABS tablet, TAKE 1 TABLET BY MOUTH TWICE A DAY, Disp: 60 tablet, Rfl: 1 .  gabapentin (NEURONTIN) 600 MG tablet, Take 600 mg by mouth 2 (two) times daily. , Disp: , Rfl: 5 .  glycopyrrolate (ROBINUL) 1 MG tablet, Take 3 mg by mouth daily., Disp: , Rfl: 2 .  hydrOXYzine (ATARAX/VISTARIL) 10 MG tablet, Take 1-2 tablets (10-20 mg total) by mouth at bedtime., Disp: 60 tablet, Rfl: 0 .  PARoxetine (PAXIL) 10 MG tablet, Take 1 tablet (10 mg total) by mouth daily., Disp: 30 tablet, Rfl: 0 .  tretinoin (RETIN-A) 0.025 % cream, Apply 1 g topically at bedtime., Disp: , Rfl:   Allergies  Allergen Reactions  . Topamax [Topiramate] Other (See Comments)    Confusion , slurred speech     I personally reviewed active problem list, medication list, allergies, family history, social history with the patient/caregiver today.   ROS  Ten systems reviewed and is negative except as mentioned in HPI  Positive for mild weight gain   Objective  Virtual encounter, vitals obtained at home  Today's Vitals   07/02/19 1038  BP: 125/80  Pulse: 75   Weight: 192 lb (87.1 kg)  Height: 5\' 6"  (1.676 m)   Body mass index is 30.99 kg/m.   Physical Exam  Awake, alert and oriented   PHQ2/9: Depression screen [facility] 2/9 07/02/2019 06/06/2019 12/02/2018 09/06/2018 02/20/2018  Decreased Interest 0 0 0 0 0  Down, Depressed, Hopeless 0 0 0 0 1  PHQ - 2 Score 0 0 0 0 1  Altered sleeping 0 3 0 2 0  Tired, decreased energy 0 0 0 1 0  Change in appetite 0 0 0 0 0  Feeling bad or failure about yourself  0 0 0 0 0  Trouble concentrating 0 0 0 0 0  Moving slowly or fidgety/restless  0 0 0 0 0  Suicidal thoughts 0 0 0 0 0  PHQ-9 Score 0 3 0 3 1  Difficult doing work/chores Not difficult at all Not difficult at all - Not difficult at all Not difficult at all  Some recent data might be hidden   PHQ-2/9 Result is negative.    GAD 7 : Generalized Anxiety Score 07/02/2019 06/06/2019 02/20/2018 08/20/2017  Nervous, Anxious, on Edge 3 1 0 0  Control/stop worrying 0 0 0 0  Worry too much - different things 0 0 0 0  Trouble relaxing 1 1 0 0  Restless 0 1 0 0  Easily annoyed or irritable 0 1 0 0  Afraid - awful might happen 0 0 0 0  Total GAD 7 Score 4 4 0 0  Anxiety Difficulty Somewhat difficult Somewhat difficult - Not difficult at all     Fall Risk: Fall Risk  07/02/2019 06/06/2019 12/11/2018 12/02/2018 09/06/2018  Falls in the past year? 0 0 0 0 1  Number falls in past yr: 0 0 0 0 0  Injury with Fall? 0 0 0 0 1  Comment - - - - Right Elbow     Assessment & Plan  1. Hot flashes  We will try Effexor   2. Panic attacks  - venlafaxine XR (EFFEXOR XR) 37.5 MG 24 hr capsule; Take 1 capsule (37.5 mg total) by mouth daily with breakfast.  Dispense: 60 capsule; Refill: 0  Discussed mindfulness and therapy also  Discussed how to titrate paxil down and add Effexor, advised to take it in am's to not disturb her sleep     I discussed the assessment and treatment plan with the patient. The patient was provided an opportunity to ask questions and all  were answered. The patient agreed with the plan and demonstrated an understanding of the instructions.   The patient was advised to call back or seek an in-person evaluation if the symptoms worsen or if the condition fails to improve as anticipated.  I provided 15 minutes of non-face-to-face time during this encounter.  Loistine Chance, MD

## 2019-07-03 ENCOUNTER — Encounter: Payer: Self-pay | Admitting: Podiatry

## 2019-07-07 DIAGNOSIS — M722 Plantar fascial fibromatosis: Secondary | ICD-10-CM | POA: Diagnosis not present

## 2019-07-07 DIAGNOSIS — M216X2 Other acquired deformities of left foot: Secondary | ICD-10-CM | POA: Diagnosis not present

## 2019-07-07 MED ORDER — OXYCODONE-ACETAMINOPHEN 10-325 MG PO TABS: 1.0000 | ORAL_TABLET | Freq: Four times a day (QID) | ORAL | 0 refills | Status: AC | PRN

## 2019-07-07 NOTE — Addendum Note (Signed)
Addended by: Boneta Lucks on: 07/07/2019 11:43 AM   Modules accepted: Orders

## 2019-07-08 ENCOUNTER — Telehealth: Payer: Self-pay

## 2019-07-08 DIAGNOSIS — M7662 Achilles tendinitis, left leg: Secondary | ICD-10-CM

## 2019-07-09 NOTE — Telephone Encounter (Signed)
I did gastrocnemius recession with EPF on her. Her calf pain is likely associated with that.

## 2019-07-10 ENCOUNTER — Encounter: Payer: Self-pay | Admitting: Family Medicine

## 2019-07-12 ENCOUNTER — Other Ambulatory Visit: Payer: Self-pay | Admitting: Hematology and Oncology

## 2019-07-14 ENCOUNTER — Other Ambulatory Visit: Payer: Self-pay | Admitting: Family Medicine

## 2019-07-14 MED ORDER — PAROXETINE HCL 10 MG PO TABS: 10.0000 mg | ORAL_TABLET | Freq: Every day | ORAL | 0 refills | Status: DC

## 2019-07-15 ENCOUNTER — Ambulatory Visit (INDEPENDENT_AMBULATORY_CARE_PROVIDER_SITE_OTHER): Payer: 59 | Admitting: Podiatry

## 2019-07-15 ENCOUNTER — Other Ambulatory Visit: Payer: Self-pay | Admitting: Family Medicine

## 2019-07-15 ENCOUNTER — Other Ambulatory Visit: Payer: Self-pay

## 2019-07-15 DIAGNOSIS — Z9889 Other specified postprocedural states: Secondary | ICD-10-CM

## 2019-07-15 DIAGNOSIS — M722 Plantar fascial fibromatosis: Secondary | ICD-10-CM

## 2019-07-15 DIAGNOSIS — M7662 Achilles tendinitis, left leg: Secondary | ICD-10-CM

## 2019-07-15 MED ORDER — PAROXETINE HCL ER 12.5 MG PO TB24: 12.5000 mg | ORAL_TABLET | Freq: Every day | ORAL | 0 refills | Status: DC

## 2019-07-15 MED ORDER — OXYCODONE-ACETAMINOPHEN 10-325 MG PO TABS: 1.0000 | ORAL_TABLET | Freq: Four times a day (QID) | ORAL | 0 refills | Status: AC | PRN

## 2019-07-16 ENCOUNTER — Encounter: Payer: Self-pay | Admitting: Podiatry

## 2019-07-16 NOTE — Progress Notes (Signed)
Subjective:  Patient ID: Tammy Garza, female    DOB: 1980/09/10,  MRN: [id number]  Chief Complaint  Patient presents with  . Routine Post Op    POV #1 DOS 07/07/19-LEFT EPF, LEFT GASTROCNEMIUS RECESS, pt states that she is feeling alot better, since she last had surgery, pt shows no signs of infection, pt's sutures are intact, pt also has no other comments or concerns    39 y.o. female returns for post-op check.  Patient states that she is doing well.  Her pain is 3 out of 10 on the pain scale.  She has been keeping it well wrapped.  She has been ambulating with a cam boot.  She states that she has tingling sensation once in a while.  She denies any other clinical signs of infection.  Review of Systems: Negative except as noted in the HPI. Denies N/V/F/Ch.  Past Medical History:  Diagnosis Date  . Anemia    h/o  . Cervical high risk human papillomavirus (HPV) DNA test positive   . Chronic gastritis   . Depression   . DVT (deep venous thrombosis) ([place]) 09/2016   left leg  . GERD (gastroesophageal reflux disease)   . H. pylori infection   . History of attempted suicide   . History of gastric polyp   . History of kidney stones    h/o  . Hyperhidrosis   . Migraines   . Protein C deficiency ([place])   . Rash   . Shift work sleep disorder     Current Outpatient Medications:  .  baclofen (LIORESAL) 10 MG tablet, Take 10 mg by mouth 2 (two) times daily as needed (migraines). , Disp: , Rfl:  .  chlorproMAZINE (THORAZINE) 25 MG tablet, Take 25 mg by mouth every 2 (two) hours as needed (migraines). , Disp: , Rfl:  .  clindamycin (CLEOCIN T) 1 % lotion, Apply 1 g topically daily. On face , Disp: , Rfl:  .  ELDERBERRY PO, Take 10 mLs by mouth daily., Disp: , Rfl:  .  ELIQUIS 5 MG TABS tablet, TAKE 1 TABLET BY MOUTH TWICE A DAY, Disp: 60 tablet, Rfl: 1 .  gabapentin (NEURONTIN) 600 MG tablet, Take 600 mg by mouth 2 (two) times daily. , Disp: , Rfl: 5 .  glycopyrrolate (ROBINUL) 1 MG  tablet, Take 3 mg by mouth daily., Disp: , Rfl: 2 .  hydrOXYzine (ATARAX/VISTARIL) 10 MG tablet, Take 1-2 tablets (10-20 mg total) by mouth at bedtime., Disp: 60 tablet, Rfl: 0 .  PARoxetine (PAXIL CR) 12.5 MG 24 hr tablet, Take 1 tablet (12.5 mg total) by mouth daily., Disp: 30 tablet, Rfl: 0 .  tretinoin (RETIN-A) 0.025 % cream, Apply 1 g topically at bedtime., Disp: , Rfl:  .  oxyCODONE-acetaminophen (PERCOCET) 10-325 MG tablet, Take 1 tablet by mouth every 6 (six) hours as needed for up to 8 days for pain., Disp: 30 tablet, Rfl: 0  Social History   Tobacco Use  Smoking Status Former Smoker  . Packs/day: 0.25  . Years: 5.00  . Pack years: 1.25  . Types: Cigarettes  . Start date: 05/15/2002  . Quit date: 05/16/2007  . Years since quitting: 12.1  Smokeless Tobacco Never Used    Allergies  Allergen Reactions  . Topamax [Topiramate] Other (See Comments)    Confusion , slurred speech    Objective:  There were no vitals filed for this visit. There is no height or weight on file to calculate BMI. Constitutional Well developed. Well  nourished.  Vascular Foot warm and well perfused. Capillary refill normal to all digits.   Neurologic Normal speech. Oriented to person, place, and time. Epicritic sensation to light touch grossly present bilaterally.  Dermatologic Skin healing well without signs of infection. Skin edges well coapted without signs of infection.  Orthopedic: Tenderness to palpation noted about the surgical site.   Radiographs: None Assessment:   1. Achilles tendinitis, left leg   2. Plantar fasciitis of left foot   3. S/P foot surgery    Plan:  Patient was evaluated and treated and all questions answered.  S/p foot surgery left -Progressing as expected post-operatively. -XR: None -WB Status: Weightbearing as tolerated in cam boot -Sutures: Intact without any clinical signs of dehiscence.  No clinical signs of infection noted -Medications: Percocet -Foot  redressed.  No follow-ups on file.

## 2019-07-18 ENCOUNTER — Encounter: Payer: Self-pay | Admitting: Family Medicine

## 2019-07-22 ENCOUNTER — Ambulatory Visit (INDEPENDENT_AMBULATORY_CARE_PROVIDER_SITE_OTHER): Payer: 59 | Admitting: Podiatry

## 2019-07-22 ENCOUNTER — Other Ambulatory Visit: Payer: Self-pay

## 2019-07-22 DIAGNOSIS — Z9889 Other specified postprocedural states: Secondary | ICD-10-CM

## 2019-07-22 DIAGNOSIS — M7662 Achilles tendinitis, left leg: Secondary | ICD-10-CM

## 2019-07-22 DIAGNOSIS — M722 Plantar fascial fibromatosis: Secondary | ICD-10-CM

## 2019-07-23 ENCOUNTER — Encounter: Payer: Self-pay | Admitting: Podiatry

## 2019-07-23 NOTE — Progress Notes (Signed)
Subjective:  Patient ID: Tammy Garza, female    DOB: [birth year],  MRN: [id number]  Chief Complaint  Patient presents with  . Routine Post Op    POV #2 DOS 07/07/19-LEFT EPF, LEFT GASTROCNEMIUS RECESS,     39 y.o. female returns for post-op check.  Patient states that she is doing well.  Her pain is 3 out of 10 on the pain scale.  She has been keeping it well wrapped.  She has been ambulating with a cam boot.  She states that she has tingling sensation once in a while.  She denies any other clinical signs of infection.  Review of Systems: Negative except as noted in the HPI. Denies N/V/F/Ch.  Past Medical History:  Diagnosis Date  . Anemia    h/o  . Cervical high risk human papillomavirus (HPV) DNA test positive   . Chronic gastritis   . Depression   . DVT (deep venous thrombosis) ([place]) 09/2016   left leg  . GERD (gastroesophageal reflux disease)   . H. pylori infection   . History of attempted suicide   . History of gastric polyp   . History of kidney stones    h/o  . Hyperhidrosis   . Migraines   . Protein C deficiency ([place])   . Rash   . Shift work sleep disorder     Current Outpatient Medications:  .  baclofen (LIORESAL) 10 MG tablet, Take 10 mg by mouth 2 (two) times daily as needed (migraines). , Disp: , Rfl:  .  chlorproMAZINE (THORAZINE) 25 MG tablet, Take 25 mg by mouth every 2 (two) hours as needed (migraines). , Disp: , Rfl:  .  clindamycin (CLEOCIN T) 1 % lotion, Apply 1 g topically daily. On face , Disp: , Rfl:  .  ELDERBERRY PO, Take 10 mLs by mouth daily., Disp: , Rfl:  .  ELIQUIS 5 MG TABS tablet, TAKE 1 TABLET BY MOUTH TWICE A DAY, Disp: 60 tablet, Rfl: 1 .  gabapentin (NEURONTIN) 600 MG tablet, Take 600 mg by mouth 2 (two) times daily. , Disp: , Rfl: 5 .  glycopyrrolate (ROBINUL) 1 MG tablet, Take 3 mg by mouth daily., Disp: , Rfl: 2 .  hydrOXYzine (ATARAX/VISTARIL) 10 MG tablet, Take 1-2 tablets (10-20 mg total) by mouth at bedtime., Disp: 60 tablet, Rfl:  0 .  oxyCODONE-acetaminophen (PERCOCET) 10-325 MG tablet, Take 1 tablet by mouth every 6 (six) hours as needed for up to 8 days for pain., Disp: 30 tablet, Rfl: 0 .  PARoxetine (PAXIL CR) 12.5 MG 24 hr tablet, Take 1 tablet (12.5 mg total) by mouth daily., Disp: 30 tablet, Rfl: 0 .  tretinoin (RETIN-A) 0.025 % cream, Apply 1 g topically at bedtime., Disp: , Rfl:   Social History   Tobacco Use  Smoking Status Former Smoker  . Packs/day: 0.25  . Years: 5.00  . Pack years: 1.25  . Types: Cigarettes  . Start date: 05/15/2002  . Quit date: 05/16/2007  . Years since quitting: 12.1  Smokeless Tobacco Never Used    Allergies  Allergen Reactions  . Topamax [Topiramate] Other (See Comments)    Confusion , slurred speech    Objective:  There were no vitals filed for this visit. There is no height or weight on file to calculate BMI. Constitutional Well developed. Well nourished.  Vascular Foot warm and well perfused. Capillary refill normal to all digits.   Neurologic Normal speech. Oriented to person, place, and time. Epicritic sensation to light touch  grossly present bilaterally.  Dermatologic Skin healing well without signs of infection. Skin edges well coapted without signs of infection.  Orthopedic:  Mild tenderness to palpation noted about the surgical site.   Radiographs: None Assessment:   1. Achilles tendinitis, left leg   2. Plantar fasciitis of left foot   3. S/P foot surgery    Plan:  Patient was evaluated and treated and all questions answered.  S/p foot surgery left -Progressing as expected post-operatively. -XR: None -WB Status: Patient can transition from weightbearing as tolerated cam boot into regular sneakers over the next 2-week course.  Ice the patient to slowly start the transition process. -Sutures: Sutures were removed without any clinical signs of dehiscence.  No clinical signs of infection noted.  Patient will start getting the foot wet in 1  week. -Medications: None -Foot redressed.  No follow-ups on file.

## 2019-07-28 ENCOUNTER — Encounter: Payer: Self-pay | Admitting: *Deleted

## 2019-08-05 ENCOUNTER — Ambulatory Visit (INDEPENDENT_AMBULATORY_CARE_PROVIDER_SITE_OTHER): Payer: 59 | Admitting: Podiatry

## 2019-08-05 ENCOUNTER — Encounter: Payer: Self-pay | Admitting: *Deleted

## 2019-08-05 ENCOUNTER — Other Ambulatory Visit: Payer: Self-pay

## 2019-08-05 DIAGNOSIS — M722 Plantar fascial fibromatosis: Secondary | ICD-10-CM

## 2019-08-05 DIAGNOSIS — M7662 Achilles tendinitis, left leg: Secondary | ICD-10-CM

## 2019-08-05 DIAGNOSIS — Z9889 Other specified postprocedural states: Secondary | ICD-10-CM

## 2019-08-06 ENCOUNTER — Encounter: Payer: Self-pay | Admitting: Podiatry

## 2019-08-06 NOTE — Progress Notes (Signed)
Subjective:  Patient ID: Tammy Garza, female    DOB: 14-May-1981,  MRN: [id number]  Chief Complaint  Patient presents with  . Routine Post Op     POV #3 DOS 07/07/19-LEFT EPF, LEFT GASTROCNEMIUS RECESS     39 y.o. female returns for post-op check.  She states that she is feeling a little bit better.  She is concerned about the swelling of the ankle.  She states that she is looking to get out of the boot.  No clinical signs of infection.  Overall she is doing better and is happy with the surgery.  She has been doing stretching exercises at home.  She denies any other acute complaints.  She states that she is not able to fully walk and is has to long hard to walk.  I believe she will benefit from physical therapy to help with gait training and assessment.  Review of Systems: Negative except as noted in the HPI. Denies N/V/F/Ch.  Past Medical History:  Diagnosis Date  . Anemia    h/o  . Cervical high risk human papillomavirus (HPV) DNA test positive   . Chronic gastritis   . Depression   . DVT (deep venous thrombosis) ([place]) 09/2016   left leg  . GERD (gastroesophageal reflux disease)   . H. pylori infection   . History of attempted suicide   . History of gastric polyp   . History of kidney stones    h/o  . Hyperhidrosis   . Migraines   . Protein C deficiency ([place])   . Rash   . Shift work sleep disorder     Current Outpatient Medications:  .  baclofen (LIORESAL) 10 MG tablet, Take 10 mg by mouth 2 (two) times daily as needed (migraines). , Disp: , Rfl:  .  chlorproMAZINE (THORAZINE) 25 MG tablet, Take 25 mg by mouth every 2 (two) hours as needed (migraines). , Disp: , Rfl:  .  clindamycin (CLEOCIN T) 1 % lotion, Apply 1 g topically daily. On face , Disp: , Rfl:  .  ELDERBERRY PO, Take 10 mLs by mouth daily., Disp: , Rfl:  .  ELIQUIS 5 MG TABS tablet, TAKE 1 TABLET BY MOUTH TWICE A DAY, Disp: 60 tablet, Rfl: 1 .  gabapentin (NEURONTIN) 600 MG tablet, Take 600 mg by mouth 2 (two)  times daily. , Disp: , Rfl: 5 .  glycopyrrolate (ROBINUL) 1 MG tablet, Take 3 mg by mouth daily., Disp: , Rfl: 2 .  hydrOXYzine (ATARAX/VISTARIL) 10 MG tablet, Take 1-2 tablets (10-20 mg total) by mouth at bedtime., Disp: 60 tablet, Rfl: 0 .  PARoxetine (PAXIL CR) 12.5 MG 24 hr tablet, Take 1 tablet (12.5 mg total) by mouth daily., Disp: 30 tablet, Rfl: 0 .  tretinoin (RETIN-A) 0.025 % cream, Apply 1 g topically at bedtime., Disp: , Rfl:  .  venlafaxine XR (EFFEXOR-XR) 37.5 MG 24 hr capsule, Take 37.5 mg by mouth daily., Disp: , Rfl:   Social History   Tobacco Use  Smoking Status Former Smoker  . Packs/day: 0.25  . Years: 5.00  . Pack years: 1.25  . Types: Cigarettes  . Start date: 05/15/2002  . Quit date: 05/16/2007  . Years since quitting: 12.2  Smokeless Tobacco Never Used    Allergies  Allergen Reactions  . Topamax [Topiramate] Other (See Comments)    Confusion , slurred speech    Objective:  There were no vitals filed for this visit. There is no height or weight on file to  calculate BMI. Constitutional Well developed. Well nourished.  Vascular Foot warm and well perfused. Capillary refill normal to all digits.   Neurologic Normal speech. Oriented to person, place, and time. Epicritic sensation to light touch grossly present bilaterally.  Dermatologic  skin completely epithelialized without any clinical signs of infection.  Orthopedic:  No tenderness to palpation noted about the surgical site.  Ankle range of motion past 90 degrees to about 5 degrees of dorsiflexion to the left side.  No pain with dorsiflexion active and passive.   Radiographs: None Assessment:   1. Achilles tendinitis, left leg   2. Plantar fasciitis of left foot   3. S/P foot surgery    Plan:  Patient was evaluated and treated and all questions answered.  S/p foot surgery left -Progressing as expected post-operatively. -XR: None -WB Status: Weightbearing as tolerated in regular  sneakers. -Sutures: None. -Medications: None -I believe patient will benefit from physical therapy to help with gait training as well as general conditioning of the lower extremity on the left side.  Patient was given a prescription for [facility] physical therapy. -I will see her back again and 3 weeks to reassess.  No follow-ups on file.

## 2019-08-07 ENCOUNTER — Other Ambulatory Visit: Payer: Self-pay | Admitting: Family Medicine

## 2019-08-07 NOTE — Telephone Encounter (Signed)
Requesting a  90 tab with 1 refill for Paroxetine 12.5 mg . Last refilled on 07/15/19 for 30 tabs and no refills.

## 2019-08-13 ENCOUNTER — Ambulatory Visit: Payer: 59 | Admitting: Family Medicine

## 2019-08-13 ENCOUNTER — Other Ambulatory Visit: Payer: Self-pay

## 2019-08-13 ENCOUNTER — Encounter: Payer: Self-pay | Admitting: Family Medicine

## 2019-08-13 VITALS — BP 110/86 | HR 73 | Temp 96.8°F | Resp 16 | Ht 66.0 in | Wt 192.1 lb

## 2019-08-13 DIAGNOSIS — E669 Obesity, unspecified: Secondary | ICD-10-CM | POA: Diagnosis not present

## 2019-08-13 DIAGNOSIS — R232 Flushing: Secondary | ICD-10-CM | POA: Diagnosis not present

## 2019-08-13 DIAGNOSIS — R739 Hyperglycemia, unspecified: Secondary | ICD-10-CM | POA: Diagnosis not present

## 2019-08-13 DIAGNOSIS — F410 Panic disorder [episodic paroxysmal anxiety] without agoraphobia: Secondary | ICD-10-CM

## 2019-08-13 DIAGNOSIS — F411 Generalized anxiety disorder: Secondary | ICD-10-CM

## 2019-08-13 MED ORDER — SAXENDA 18 MG/3ML ~~LOC~~ SOPN: 3.0000 mg | PEN_INJECTOR | Freq: Every day | SUBCUTANEOUS | 2 refills | Status: DC

## 2019-08-13 NOTE — Progress Notes (Signed)
Name: Tammy Garza   MRN: [id number]    DOB: 06-20-1980   Date:08/13/2019       Progress Note  Subjective  Chief Complaint  Chief Complaint  Patient presents with  . Hot Flashes    Not having them as often.  . Weight Gain    Having concerns about gaining weight  . Anxiety    Paxil has improved anxiety    HPI  Anxiety: she noticed increase of symptoms January 2021, with more hot flashes, and night sweats affecting her sleep. She was very edgy and losing her temper when things were out of order. She is still out of work because she had plantar fascitis surgery she is due to return to work April 22 nd, she is worried about going back to work. - not sure if she will be able to do her work. She has been on Paxil , initially immediate release but it was not lasting, we tried Effexor and she could not tolerate side effects of bloating and gassy. We changed to Paxil CR and seems to be controlling symptoms but is making her hungry. She also has hydroxizine at home, no recent episodes of panic attacks  Weight gain: she states her weight was between 177 -182 lbs , and since started on Paxil in January her weight has gone up to 198 lbs - and pant size from a size 12 to size 16 and is making her get upset and anxious about it. Since last week she started to change her diet, making smoothies at home, her boyfriend advised her to stop paxil to see if the weight would go down. She states the medication seems to increase her appetite and has been snacking during the night. She has been packing lunch, taking hard boiled eggs, cutting down on carbs. Explained that she is making the correct modifications. She would like to try saxenda again   Patient Active Problem List   Diagnosis Date Noted  . Open wound 03/06/2019  . Anal skin tag   . Grade II hemorrhoids   . Hypocalcemia 10/13/2018  . Internal hemorrhoid 01/22/2018  . Protein C deficiency ([place]) 12/10/2016  . Monoclonal gammopathy of unknown  significance ([facility]) 11/30/2016  . History of DVT (deep vein thrombosis) 11/02/2016  . Elevated blood protein 09/26/2016  . Chronic deep vein thrombosis (DVT) of distal vein of left lower extremity ([place]) 09/17/2016  . Vitamin D deficiency 07/26/2015  . Thyroid cyst 07/26/2015  . History of Helicobacter pylori infection 04/22/2015  . Migraine with aura and without status migrainosus 04/22/2015  . Shift work sleep disorder 04/22/2015    Past Surgical History:  Procedure Laterality Date  . ABDOMINAL HYSTERECTOMY    . ANTERIOR CRUCIATE LIGAMENT REPAIR Left   . CHOLECYSTECTOMY    . CYSTOSCOPY  12/19/2016   Procedure: CYSTOSCOPY;  Surgeon: Gae Dry, MD;  Location: [facility] ORS;  Service: Gynecology;;  . ESOPHAGOGASTRODUODENOSCOPY (EGD) WITH PROPOFOL N/A 09/25/2017   Procedure: ESOPHAGOGASTRODUODENOSCOPY (EGD) WITH PROPOFOL;  Surgeon: Jonathon Bellows, MD;  Location: [facility] ENDOSCOPY;  Service: Gastroenterology;  Laterality: N/A;  . LAPAROSCOPIC HYSTERECTOMY Bilateral 12/19/2016   Procedure: HYSTERECTOMY TOTAL LAPAROSCOPIC BILATERAL SALPINGECTOMY;  Surgeon: Gae Dry, MD;  Location: [facility] ORS;  Service: Gynecology;  Laterality: Bilateral;  . RECTAL EXAM UNDER ANESTHESIA N/A 02/26/2019   Procedure: RECTAL EXAM UNDER ANESTHESIA, EXCISION OF ANAL SKIN TAG;  Surgeon: Fredirick Maudlin, MD;  Location: [facility] ORS;  Service: General;  Laterality: N/A;  . TUBAL LIGATION  Family History  Problem Relation Age of Onset  . HIV Sister   . Cancer Sister 50       cervical cancer  . Asthma Daughter   . Protein C deficiency Daughter   . Hypertension Maternal Grandmother   . Hypertension Mother   . Obesity Mother   . Cancer Father 67       lung cancer  . Deep vein thrombosis Father   . Protein C deficiency Father   . COPD Father   . Congestive Heart Failure Father   . Diabetes Brother   . COPD Paternal Grandmother   . Deep vein thrombosis Paternal Grandmother   . Protein C deficiency Daughter      Social History   Tobacco Use  . Smoking status: Former Smoker    Packs/day: 0.25    Years: 5.00    Pack years: 1.25    Types: Cigarettes    Start date: 05/15/2002    Quit date: 05/16/2007    Years since quitting: 12.2  . Smokeless tobacco: Never Used  Substance Use Topics  . Alcohol use: Yes    Alcohol/week: 1.0 standard drinks    Types: 1 Glasses of wine per week    Comment: occ wine none last 24 hrs     Current Outpatient Medications:  .  baclofen (LIORESAL) 10 MG tablet, Take 10 mg by mouth 2 (two) times daily as needed (migraines). , Disp: , Rfl:  .  chlorproMAZINE (THORAZINE) 25 MG tablet, Take 25 mg by mouth every 2 (two) hours as needed (migraines). , Disp: , Rfl:  .  clindamycin (CLEOCIN T) 1 % lotion, Apply 1 g topically daily. On face , Disp: , Rfl:  .  ELDERBERRY PO, Take 10 mLs by mouth daily., Disp: , Rfl:  .  ELIQUIS 5 MG TABS tablet, TAKE 1 TABLET BY MOUTH TWICE A DAY, Disp: 60 tablet, Rfl: 1 .  gabapentin (NEURONTIN) 600 MG tablet, Take 600 mg by mouth 2 (two) times daily. , Disp: , Rfl: 5 .  glycopyrrolate (ROBINUL) 1 MG tablet, Take 3 mg by mouth daily., Disp: , Rfl: 2 .  hydrOXYzine (ATARAX/VISTARIL) 10 MG tablet, Take 1-2 tablets (10-20 mg total) by mouth at bedtime., Disp: 60 tablet, Rfl: 0 .  PARoxetine (PAXIL-CR) 12.5 MG 24 hr tablet, TAKE 1 TABLET BY MOUTH EVERY DAY, Disp: 90 tablet, Rfl: 0 .  tretinoin (RETIN-A) 0.025 % cream, Apply 1 g topically at bedtime., Disp: , Rfl:  .  Liraglutide -Weight Management (SAXENDA) 18 MG/3ML SOPN, Inject 0.5 mLs (3 mg total) into the skin daily., Disp: 9 mL, Rfl: 2  Allergies  Allergen Reactions  . Topamax [Topiramate] Other (See Comments)    Confusion , slurred speech   . Effexor [Venlafaxine]     Gas and bloating     I personally reviewed active problem list, medication list, allergies, family history, social history, health maintenance with the patient/caregiver today.   ROS  Constitutional: Negative for  fever, positive for weight change.  Respiratory: Negative for cough and shortness of breath.   Cardiovascular: Negative for chest pain or palpitations.  Gastrointestinal: Negative for abdominal pain, no bowel changes.  Musculoskeletal: positive  for gait problem - still has a mild limp due to left foot surgery, but no  joint swelling.  Skin: Negative for rash.  Neurological: Negative for dizziness or headache.  No other specific complaints in a complete review of systems (except as listed in HPI above).  Objective  Vitals:  08/13/19 0944  BP: 110/86  Pulse: 73  Resp: 16  Temp: (!) 96.8 F (36 C)  TempSrc: Temporal  SpO2: 99%  Weight: 192 lb 1.6 oz (87.1 kg)  Height: 5\' 6"  (1.676 m)    Body mass index is 31.01 kg/m.  Physical Exam  Constitutional: Patient appears well-developed and well-nourished. Obese  No distress.  HEENT: head atraumatic, normocephalic, pupils equal and reactive to light Cardiovascular: Normal rate, regular rhythm and normal heart sounds.  No murmur heard. No BLE edema. Pulmonary/Chest: Effort normal and breath sounds normal. No respiratory distress. Abdominal: Soft.  There is no tenderness. Psychiatric: Patient has a normal mood and affect. behavior is normal. Judgment and thought content normal.  PHQ2/9: Depression screen [facility] 2/9 08/13/2019 07/02/2019 06/06/2019 12/02/2018 09/06/2018  Decreased Interest 0 0 0 0 0  Down, Depressed, Hopeless 0 0 0 0 0  PHQ - 2 Score 0 0 0 0 0  Altered sleeping 0 0 3 0 2  Tired, decreased energy 0 0 0 0 1  Change in appetite 0 0 0 0 0  Feeling bad or failure about yourself  0 0 0 0 0  Trouble concentrating 0 0 0 0 0  Moving slowly or fidgety/restless 0 0 0 0 0  Suicidal thoughts 0 0 0 0 0  PHQ-9 Score 0 0 3 0 3  Difficult doing work/chores - Not difficult at all Not difficult at all - Not difficult at all  Some recent data might be hidden    phq 9 is negative  GAD 7 : Generalized Anxiety Score 08/13/2019 07/02/2019  06/06/2019 02/20/2018  Nervous, Anxious, on Edge 2 3 1  0  Control/stop worrying 1 0 0 0  Worry too much - different things 0 0 0 0  Trouble relaxing 1 1 1  0  Restless 1 0 1 0  Easily annoyed or irritable 2 0 1 0  Afraid - awful might happen 0 0 0 0  Total GAD 7 Score 7 4 4  0  Anxiety Difficulty Very difficult Somewhat difficult Somewhat difficult -   She states her daughter took a lot of pills trying to commit suicide   Fall Risk: Fall Risk  08/13/2019 07/02/2019 06/06/2019 12/11/2018 12/02/2018  Falls in the past year? 0 0 0 0 0  Number falls in past yr: 0 0 0 0 0  Injury with Fall? 0 0 0 0 0  Comment - - - - -    Functional Status Survey: Is the patient deaf or have difficulty hearing?: No Does the patient have difficulty seeing, even when wearing glasses/contacts?: No Does the patient have difficulty concentrating, remembering, or making decisions?: No Does the patient have difficulty walking or climbing stairs?: Yes Does the patient have difficulty dressing or bathing?: No Does the patient have difficulty doing errands alone such as visiting a doctor's office or shopping?: No    Assessment & Plan  1. Hyperglycemia  - Liraglutide -Weight Management (SAXENDA) 18 MG/3ML SOPN; Inject 0.5 mLs (3 mg total) into the skin daily.  Dispense: 9 mL; Refill: 2  2. Hot flashes   3. Panic attacks  Doing well with paxil CR   4. Obesity (BMI 30.0-34.9)  - Liraglutide -Weight Management (SAXENDA) 18 MG/3ML SOPN; Inject 0.5 mLs (3 mg total)  into the skin daily.  Dispense: 9 mL; Refill: 2   5. GAD (generalized anxiety disorder)  Continue paxil CR  History of obesity, gaining weight is causing anxiety, she would like to resume medication, she  was very successful with medication in the past

## 2019-08-15 IMAGING — MR MR HEAD W/O CM
9 of 10 series · 43 of 48 positions shown · non-contrast
Comparison: 04/18/2018 CT head.

CLINICAL DATA: 37 y/o F; tingling in the left fingers, left-sided
headache, and slurred speech.

EXAM:
MRI HEAD WITHOUT CONTRAST
TECHNIQUE: Multiplanar, multiecho pulse sequences of the brain and surrounding
structures were obtained without intravenous contrast.

[Series 2: ax dwi_tracew · axial · 3.0mm · 0.83mm/px · z∈[-104,+54]mm · 7 of 55 slices shown]
[im 1/55]
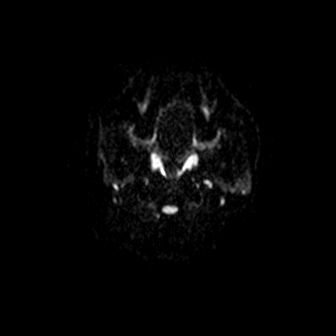
[im 10/55]
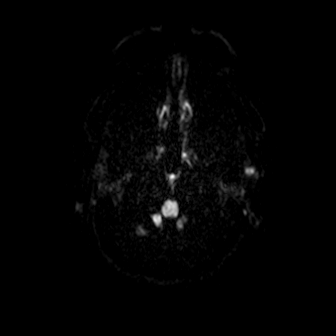
[im 19/55]
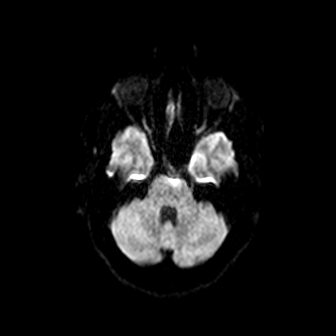
[im 28/55]
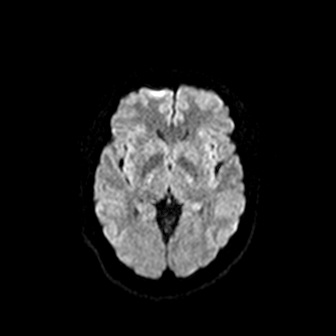
[im 37/55]
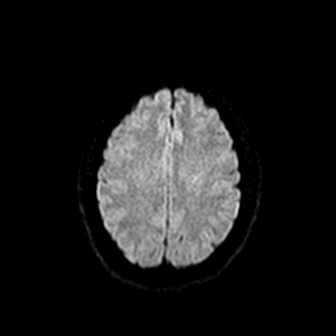
[im 46/55]
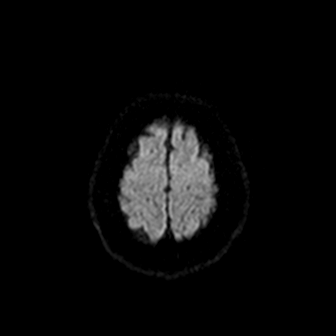
[im 55/55]
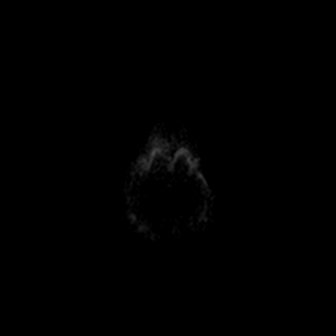

[Series 3: ax dwi_adc · axial · 3.0mm · 0.83mm/px · z∈[-104,+54]mm · 8 of 55 slices shown]
[im 1/55]
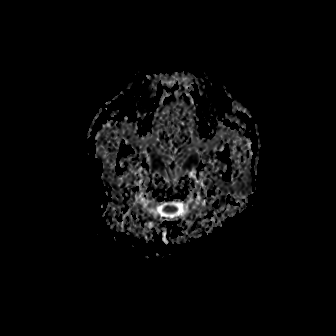
[im 8/55]
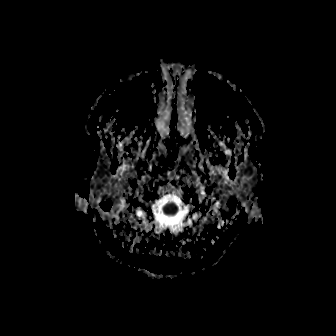
[im 16/55]
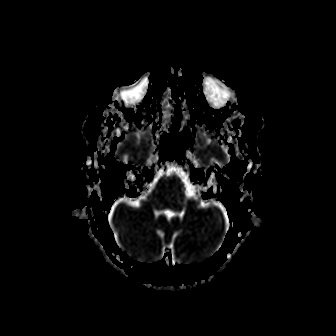
[im 24/55]
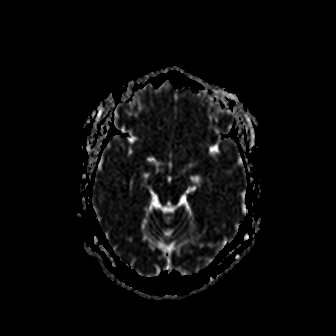
[im 31/55]
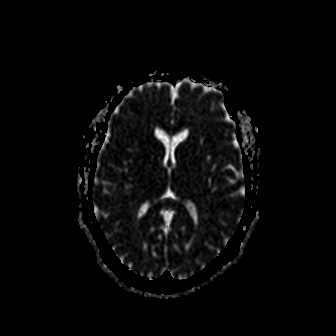
[im 39/55]
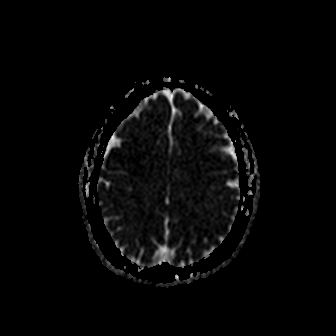
[im 47/55]
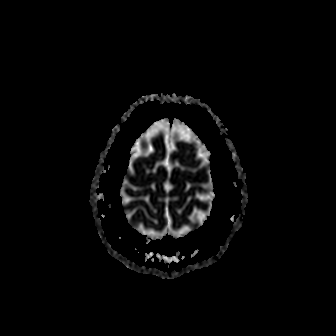
[im 55/55]
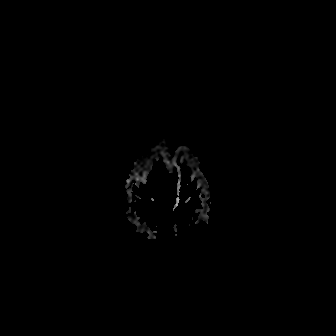

[Series 4: cor dwi_tracew · coronal · 5.0mm · 0.68mm/px · 5 of 36 slices shown]
[im 1/36]
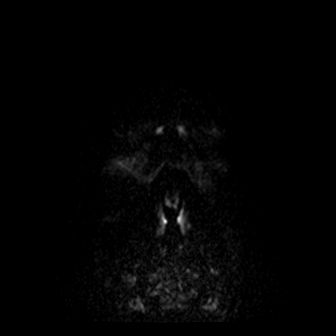
[im 9/36]
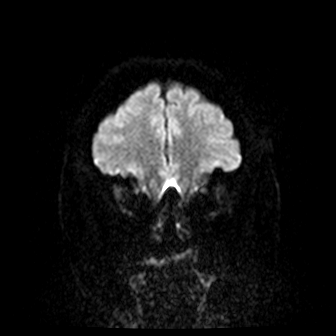
[im 18/36]
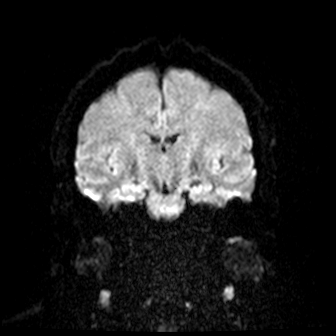
[im 27/36]
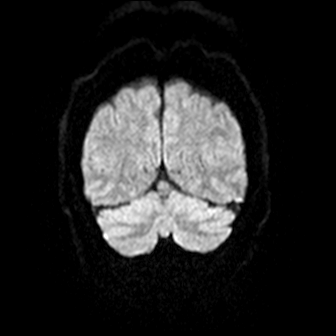
[im 36/36]
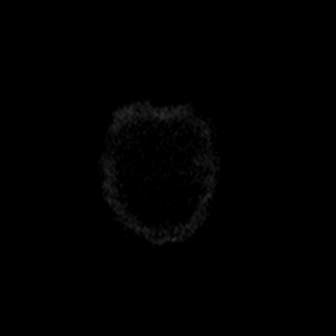

[Series 5: cor dwi_adc · coronal · 5.0mm · 0.68mm/px · 4 of 36 slices shown]
[im 1/36]
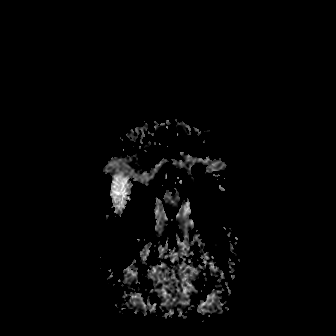
[im 9/36]
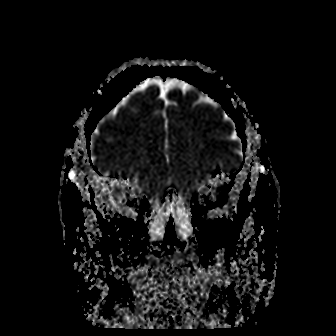
[im 18/36]
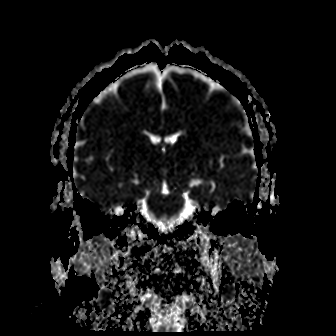
[im 27/36]
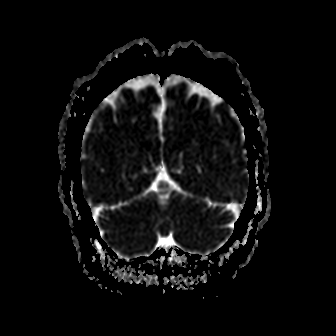

[Series 6: T1 · sagittal · 5.0mm · 0.94mm/px · 3 of 21 slices shown (1 of 2)]
[im 1/21]
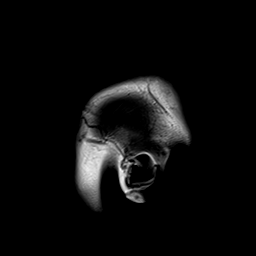
[im 11/21]
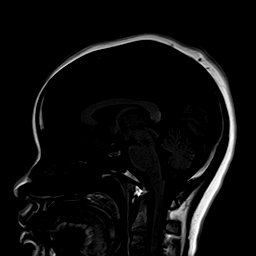
[im 21/21]
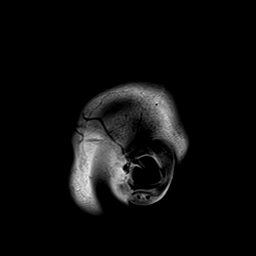

[Series 7: FLAIR · axial · 5.0mm · 1.20mm/px · z∈[-94,+59]mm · 4 of 27 slices shown]
[im 1/27]
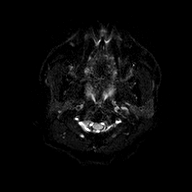
[im 9/27]
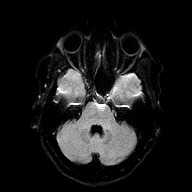
[im 18/27]
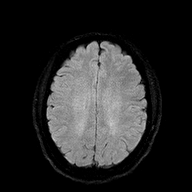
[im 27/27]
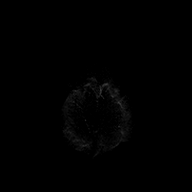

[Series 9: T2 · axial · 5.0mm · 0.45mm/px · z∈[-94,+59]mm · 4 of 27 slices shown (1 of 2)]
[im 1/27]
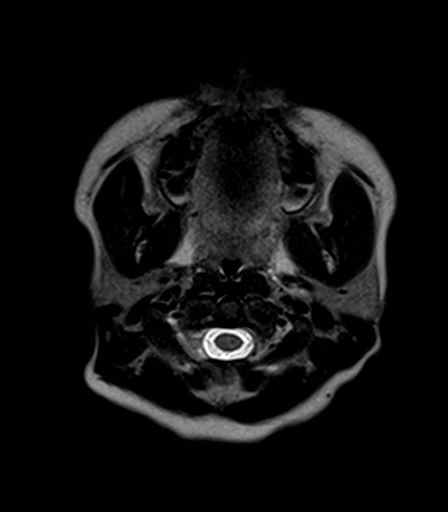
[im 9/27]
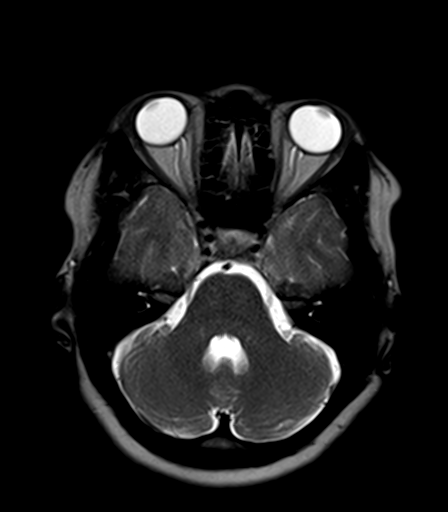
[im 18/27]
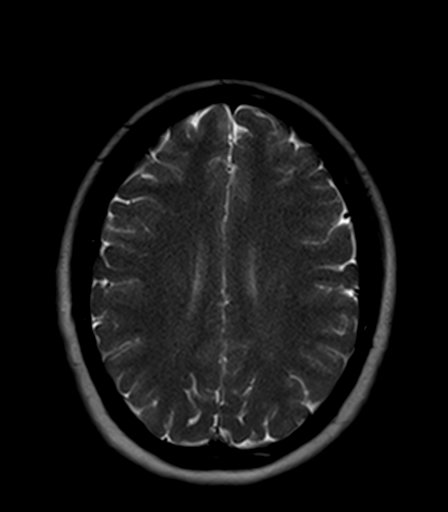
[im 27/27]
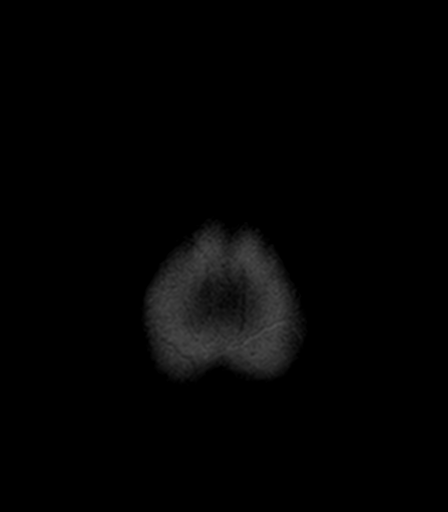

[Series 10: T1 · axial · 5.0mm · 0.90mm/px · z∈[-94,+59]mm · 4 of 27 slices shown (2 of 2)]
[im 1/27]
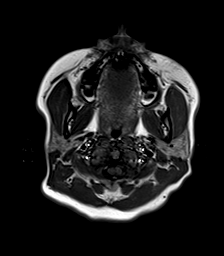
[im 9/27]
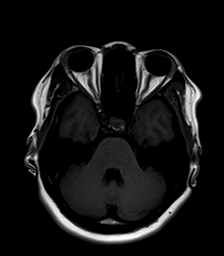
[im 18/27]
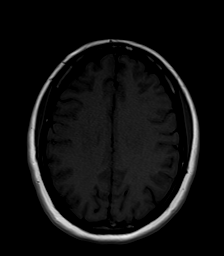
[im 27/27]
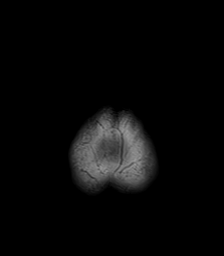

[Series 11: T2 · coronal · 5.0mm · 0.45mm/px · 4 of 31 slices shown (2 of 2)]
[im 1/31]
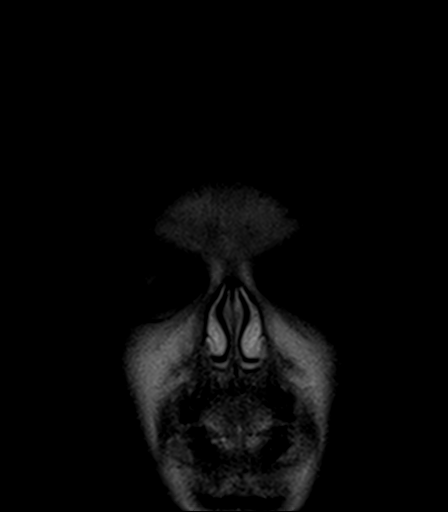
[im 11/31]
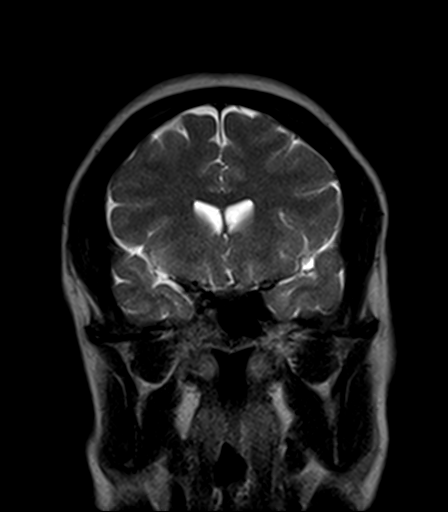
[im 21/31]
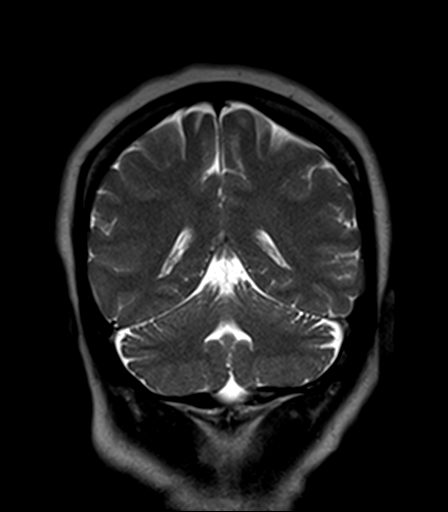
[im 31/31]
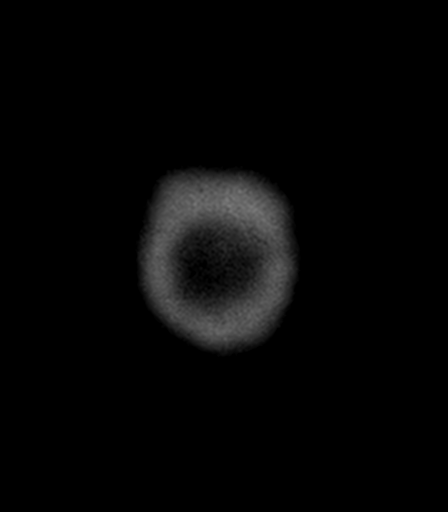

[43 of 48 positions shown; findings below may reference images not displayed]

FINDINGS: Brain: No acute infarction, hemorrhage, hydrocephalus, extra-axial
collection or mass lesion. Complete corpus callosum and vermis.
Morphologically normal pituitary gland. No cerebellar tonsillar
ectopia. No structural or signal abnormality of brain identified.

Vascular: Normal flow voids.

Skull and upper cervical spine: Normal marrow signal.

Sinuses/Orbits: Negative.

Other: None.
IMPRESSION: No acute abnormality identified.  Unremarkable MRI of the brain.

## 2019-09-02 ENCOUNTER — Other Ambulatory Visit: Payer: Self-pay

## 2019-09-02 ENCOUNTER — Encounter: Payer: Self-pay | Admitting: *Deleted

## 2019-09-02 ENCOUNTER — Ambulatory Visit (INDEPENDENT_AMBULATORY_CARE_PROVIDER_SITE_OTHER): Payer: 59 | Admitting: Podiatry

## 2019-09-02 ENCOUNTER — Encounter: Payer: Self-pay | Admitting: Podiatry

## 2019-09-02 DIAGNOSIS — Z9889 Other specified postprocedural states: Secondary | ICD-10-CM

## 2019-09-02 DIAGNOSIS — M7662 Achilles tendinitis, left leg: Secondary | ICD-10-CM

## 2019-09-02 DIAGNOSIS — M722 Plantar fascial fibromatosis: Secondary | ICD-10-CM

## 2019-09-02 NOTE — Progress Notes (Signed)
Subjective:  Patient ID: Tammy Garza, female    DOB: 30-Oct-1980,  MRN: [id number]  Chief Complaint  Patient presents with  . Routine Post Op    POV #4 DOS 07/07/19-LEFT EPF, LEFT GASTROCNEMIUS RECESS, pt is doing a lot better. Pt is concerned with swelling, pt states that she is not able to put full weight on her left foot, but states that she is improving in physical therapy.     39 y.o. female returns for post-op check.  Patient presents status post left EPF and gastroc.  Name is recession.  Patient is doing a lot better.  She states that physical therapy is going really well.  She has occasional tingling and numbing.  Patient has hard time putting full weight on the foot.  There is mild limp or when putting full weight.  She is working with physical therapy to help accomplish that.  She denies any other acute complaints.  She is ambulating regular sneakers with orthotics.  Review of Systems: Negative except as noted in the HPI. Denies N/V/F/Ch.  Past Medical History:  Diagnosis Date  . Anemia    h/o  . Cervical high risk human papillomavirus (HPV) DNA test positive   . Chronic gastritis   . Depression   . DVT (deep venous thrombosis) ([place]) 09/2016   left leg  . GERD (gastroesophageal reflux disease)   . H. pylori infection   . History of attempted suicide   . History of gastric polyp   . History of kidney stones    h/o  . Hyperhidrosis   . Migraines   . Protein C deficiency ([place])   . Rash   . Shift work sleep disorder     Current Outpatient Medications:  .  baclofen (LIORESAL) 10 MG tablet, Take 10 mg by mouth 2 (two) times daily as needed (migraines). , Disp: , Rfl:  .  chlorproMAZINE (THORAZINE) 25 MG tablet, Take 25 mg by mouth every 2 (two) hours as needed (migraines). , Disp: , Rfl:  .  clindamycin (CLEOCIN T) 1 % lotion, Apply 1 g topically daily. On face , Disp: , Rfl:  .  ELDERBERRY PO, Take 10 mLs by mouth daily., Disp: , Rfl:  .  ELIQUIS 5 MG TABS tablet, TAKE  1 TABLET BY MOUTH TWICE A DAY, Disp: 60 tablet, Rfl: 1 .  gabapentin (NEURONTIN) 600 MG tablet, Take 600 mg by mouth 2 (two) times daily. , Disp: , Rfl: 5 .  glycopyrrolate (ROBINUL) 1 MG tablet, Take 3 mg by mouth daily., Disp: , Rfl: 2 .  hydrOXYzine (ATARAX/VISTARIL) 10 MG tablet, Take 1-2 tablets (10-20 mg total) by mouth at bedtime., Disp: 60 tablet, Rfl: 0 .  Liraglutide -Weight Management (SAXENDA) 18 MG/3ML SOPN, Inject 0.5 mLs (3 mg total) into the skin daily., Disp: 9 mL, Rfl: 2 .  PARoxetine (PAXIL-CR) 12.5 MG 24 hr tablet, TAKE 1 TABLET BY MOUTH EVERY DAY, Disp: 90 tablet, Rfl: 0 .  tretinoin (RETIN-A) 0.025 % cream, Apply 1 g topically at bedtime., Disp: , Rfl:   Social History   Tobacco Use  Smoking Status Former Smoker  . Packs/day: 0.25  . Years: 5.00  . Pack years: 1.25  . Types: Cigarettes  . Start date: 05/15/2002  . Quit date: 05/16/2007  . Years since quitting: 12.3  Smokeless Tobacco Never Used    Allergies  Allergen Reactions  . Topamax [Topiramate] Other (See Comments)    Confusion , slurred speech   . Effexor [Venlafaxine]  Gas and bloating    Objective:  There were no vitals filed for this visit. There is no height or weight on file to calculate BMI. Constitutional Well developed. Well nourished.  Vascular Foot warm and well perfused. Capillary refill normal to all digits.   Neurologic Normal speech. Oriented to person, place, and time. Epicritic sensation to light touch grossly present bilaterally.  Dermatologic  skin completely epithelialized without any clinical signs of infection.  Orthopedic:  No tenderness to palpation noted about the surgical site.  Ankle range of motion past 90 degrees to about 5 degrees of dorsiflexion to the left side.  No pain with dorsiflexion active and passive.   Radiographs: None Assessment:   1. Achilles tendinitis, left leg   2. Plantar fasciitis of left foot   3. S/P foot surgery    Plan:  Patient was  evaluated and treated and all questions answered.  S/p foot surgery left -Progressing as expected post-operatively. -XR: None -WB Status: Weightbearing as tolerated in regular sneakers. -Sutures: None. -Medications: None -Patient will continue to physical therapy as it is extremely helping her.  I believe she will benefit from 4 more weeks of physical therapy and then I will reevaluate.  At this point I would like her to work with physical therapy to attempt get back to work that includes going up the ladder.  Patient states understanding will work with physical therapy -I will see her back again and for weeks to reassess.  No follow-ups on file.

## 2019-09-08 ENCOUNTER — Other Ambulatory Visit: Payer: Self-pay | Admitting: Hematology and Oncology

## 2019-09-10 ENCOUNTER — Ambulatory Visit (INDEPENDENT_AMBULATORY_CARE_PROVIDER_SITE_OTHER): Payer: 59 | Admitting: Family Medicine

## 2019-09-10 ENCOUNTER — Encounter: Payer: Self-pay | Admitting: Family Medicine

## 2019-09-10 ENCOUNTER — Other Ambulatory Visit: Payer: Self-pay

## 2019-09-10 VITALS — BP 104/70 | HR 72 | Temp 96.9°F | Resp 16 | Ht 65.5 in | Wt 190.2 lb

## 2019-09-10 DIAGNOSIS — Z13220 Encounter for screening for lipoid disorders: Secondary | ICD-10-CM | POA: Diagnosis not present

## 2019-09-10 DIAGNOSIS — Z0000 Encounter for general adult medical examination without abnormal findings: Secondary | ICD-10-CM | POA: Diagnosis not present

## 2019-09-10 DIAGNOSIS — Z79899 Other long term (current) drug therapy: Secondary | ICD-10-CM

## 2019-09-10 DIAGNOSIS — E559 Vitamin D deficiency, unspecified: Secondary | ICD-10-CM | POA: Diagnosis not present

## 2019-09-10 DIAGNOSIS — Z1231 Encounter for screening mammogram for malignant neoplasm of breast: Secondary | ICD-10-CM

## 2019-09-10 NOTE — Progress Notes (Signed)
Name: Tammy Garza   MRN: [id number]    DOB: 11-09-1980   Date:09/10/2019       Progress Note  Subjective  Chief Complaint  Chief Complaint  Patient presents with  . Annual Exam    HPI  Patient presents for annual CPE.  Diet: she likes fruit and vegetables, using infused water, discussed need to stop juices.  Exercise: advised 150 minutes per week  USPSTF grade A and B recommendations    Office Visit from 06/06/2019 in [facility]  AUDIT-C Score  1     Depression: Phq 9 is  negative Depression screen [facility] 2/9 09/10/2019 08/13/2019 07/02/2019 06/06/2019 12/02/2018  Decreased Interest 0 0 0 0 0  Down, Depressed, Hopeless 0 0 0 0 0  PHQ - 2 Score 0 0 0 0 0  Altered sleeping 0 0 0 3 0  Tired, decreased energy 0 0 0 0 0  Change in appetite 0 0 0 0 0  Feeling bad or failure about yourself  0 0 0 0 0  Trouble concentrating 0 0 0 0 0  Moving slowly or fidgety/restless 0 0 0 0 0  Suicidal thoughts 0 0 0 0 0  PHQ-9 Score 0 0 0 3 0  Difficult doing work/chores - - Not difficult at all Not difficult at all -  Some recent data might be hidden   Hypertension: BP Readings from Last 3 Encounters:  09/10/19 104/70  08/13/19 110/86  07/02/19 125/80   Obesity: Wt Readings from Last 3 Encounters:  09/10/19 190 lb 3.2 oz (86.3 kg)  08/13/19 192 lb 1.6 oz (87.1 kg)  07/02/19 192 lb (87.1 kg)   BMI Readings from Last 3 Encounters:  09/10/19 31.17 kg/m  08/13/19 31.01 kg/m  07/02/19 30.99 kg/m     Hep C Screening: up to date  STD testing and prevention (HIV/chl/gon/syphilis): not interested  Intimate partner violence: negative screen  Sexual History (Partners/Practices/Protection from [employer] hx STI/Pregnancy Plans): no pain, no history of STI Pain during Intercourse: no problems  Menstrual History/LMP/Abnormal Bleeding: s/p hysterectomy  Incontinence Symptoms: no problems   Breast cancer:   - Last Mammogram: due for one  - BRCA gene screening: sister had  ovarian cancer in 2017, she did not get genetic testing , no other family members   Osteoporosis: Discussed high calcium and vitamin D supplementation, weight bearing exercises  Cervical cancer screening: s/p hysterectomy   Skin cancer: Discussed monitoring for atypical lesions  Colorectal cancer: start at 83  Lung cancer:   Low Dose CT Chest recommended if Age 37-80 years, 30 pack-year currently smoking OR have quit w/in 15years. Patient does not qualify.   ECG: 2019   Advanced Care Planning: A voluntary discussion about advance care planning including the explanation and discussion of advance directives.  Discussed health care proxy and Living will, and the patient was able to identify a health care proxy as father .  Patient does not have a living will at present time.   Lipids: Lab Results  Component Value Date   CHOL 153 09/06/2018   CHOL 181 02/20/2018   CHOL 152 07/26/2015   Lab Results  Component Value Date   HDL 61 09/06/2018   HDL 65 02/20/2018   HDL 70 07/26/2015   Lab Results  Component Value Date   LDLCALC 74 09/06/2018   LDLCALC 99 02/20/2018   LDLCALC 68 07/26/2015   Lab Results  Component Value Date   TRIG 94 09/06/2018   TRIG 84  02/20/2018   TRIG 70 07/26/2015   Lab Results  Component Value Date   CHOLHDL 2.5 09/06/2018   CHOLHDL 2.8 02/20/2018   CHOLHDL 2.2 07/26/2015   No results found for: LDLDIRECT  Glucose: Glucose  Date Value Ref Range Status  04/17/2014 81 65 - 99 mg/dL Final  04/16/2014 103 (H) 65 - 99 mg/dL Final  04/14/2014 77 65 - 99 mg/dL Final   Glucose, Bld  Date Value Ref Range Status  04/15/2019 100 (H) 70 - 99 mg/dL Final  01/15/2019 95 70 - 99 mg/dL Final  10/08/2018 95 70 - 99 mg/dL Final    Patient Active Problem List   Diagnosis Date Noted  . Open wound 03/06/2019  . Anal skin tag   . Grade II hemorrhoids   . Hypocalcemia 10/13/2018  . Internal hemorrhoid 01/22/2018  . Protein C deficiency ([place]) 12/10/2016  .  Monoclonal gammopathy of unknown significance (MGUS) 11/30/2016  . History of DVT (deep vein thrombosis) 11/02/2016  . Elevated blood protein 09/26/2016  . Chronic deep vein thrombosis (DVT) of distal vein of left lower extremity ([place]) 09/17/2016  . Vitamin D deficiency 07/26/2015  . Thyroid cyst 07/26/2015  . History of Helicobacter pylori infection 04/22/2015  . Migraine with aura and without status migrainosus 04/22/2015  . Shift work sleep disorder 04/22/2015    Past Surgical History:  Procedure Laterality Date  . ABDOMINAL HYSTERECTOMY    . ANTERIOR CRUCIATE LIGAMENT REPAIR Left   . CHOLECYSTECTOMY    . CYSTOSCOPY  12/19/2016   Procedure: CYSTOSCOPY;  Surgeon: Gae Dry, MD;  Location: [facility] ORS;  Service: Gynecology;;  . ESOPHAGOGASTRODUODENOSCOPY (EGD) WITH PROPOFOL N/A 09/25/2017   Procedure: ESOPHAGOGASTRODUODENOSCOPY (EGD) WITH PROPOFOL;  Surgeon: Jonathon Bellows, MD;  Location: [facility] ENDOSCOPY;  Service: Gastroenterology;  Laterality: N/A;  . LAPAROSCOPIC HYSTERECTOMY Bilateral 12/19/2016   Procedure: HYSTERECTOMY TOTAL LAPAROSCOPIC BILATERAL SALPINGECTOMY;  Surgeon: Gae Dry, MD;  Location: [facility] ORS;  Service: Gynecology;  Laterality: Bilateral;  . PLANTAR FASCIA SURGERY  06/2019  . RECTAL EXAM UNDER ANESTHESIA N/A 02/26/2019   Procedure: RECTAL EXAM UNDER ANESTHESIA, EXCISION OF ANAL SKIN TAG;  Surgeon: Fredirick Maudlin, MD;  Location: [facility] ORS;  Service: General;  Laterality: N/A;  . TUBAL LIGATION      Family History  Problem Relation Age of Onset  . HIV Sister   . Cancer Sister 42       cervical cancer  . Asthma Daughter   . Protein C deficiency Daughter   . Hypertension Maternal Grandmother   . Hypertension Mother   . Obesity Mother   . Cancer Father 64       lung cancer  . Deep vein thrombosis Father   . Protein C deficiency Father   . COPD Father   . Congestive Heart Failure Father   . Diabetes Brother   . COPD Paternal Grandmother   . Deep vein  thrombosis Paternal Grandmother   . Protein C deficiency Daughter     Social History   Socioeconomic History  . Marital status: Divorced    Spouse name: Not on file  . Number of children: 2  . Years of education: 50  . Highest education level: Associate degree: occupational, Hotel manager, or vocational program  Occupational History  . Not on file  Tobacco Use  . Smoking status: Former Smoker    Packs/day: 0.25    Years: 5.00    Pack years: 1.25    Types: Cigarettes    Start date: 05/15/2002  Quit date: 05/16/2007    Years since quitting: 12.3  . Smokeless tobacco: Never Used  Substance and Sexual Activity  . Alcohol use: Yes    Alcohol/week: 1.0 standard drinks    Types: 1 Glasses of wine per week    Comment: occ wine none last 24 hrs  . Drug use: Yes    Types: Marijuana  . Sexual activity: Yes    Partners: Male    Birth control/protection: Surgical    Comment: hysterectomy   Other Topics Concern  . Not on file  Social History Narrative   Separated since June 2016 - she has two children at home . The youngest is his daughter. Working at [employer] in [place].    Dating Gwyndolyn Saxon since 2015   Social Determinants of Health   Financial Resource Strain: Low Risk   . Difficulty of Paying Living Expenses: Not hard at all  Food Insecurity: No Food Insecurity  . Worried About Charity fundraiser in the Last Year: Never true  . Ran Out of Food in the Last Year: Never true  Transportation Needs: No Transportation Needs  . Lack of Transportation (Medical): No  . Lack of Transportation (Non-Medical): No  Physical Activity: Insufficiently Active  . Days of Exercise per Week: 4 days  . Minutes of Exercise per Session: 30 min  Stress: No Stress Concern Present  . Feeling of Stress : Only a little  Social Connections: Moderately Isolated  . Frequency of Communication with Friends and Family: More than three times a week  . Frequency of Social Gatherings with Friends and Family:  Once a week  . Attends Religious Services: Never  . Active Member of Clubs or Organizations: No  . Attends Archivist Meetings: Never  . Marital Status: Divorced  Human resources officer Violence: Not At Risk  . Fear of Current or Ex-Partner: No  . Emotionally Abused: No  . Physically Abused: No  . Sexually Abused: No     Current Outpatient Medications:  .  baclofen (LIORESAL) 10 MG tablet, Take 10 mg by mouth 2 (two) times daily as needed (migraines). , Disp: , Rfl:  .  chlorproMAZINE (THORAZINE) 25 MG tablet, Take 25 mg by mouth every 2 (two) hours as needed (migraines). , Disp: , Rfl:  .  clindamycin (CLEOCIN T) 1 % lotion, Apply 1 g topically daily. On face , Disp: , Rfl:  .  ELIQUIS 5 MG TABS tablet, TAKE 1 TABLET BY MOUTH TWICE A DAY, Disp: 60 tablet, Rfl: 1 .  gabapentin (NEURONTIN) 600 MG tablet, Take 600 mg by mouth 2 (two) times daily. , Disp: , Rfl: 5 .  glycopyrrolate (ROBINUL) 1 MG tablet, Take 3 mg by mouth daily., Disp: , Rfl: 2 .  hydrOXYzine (ATARAX/VISTARIL) 10 MG tablet, Take 1-2 tablets (10-20 mg total) by mouth at bedtime., Disp: 60 tablet, Rfl: 0 .  Liraglutide -Weight Management (SAXENDA) 18 MG/3ML SOPN, Inject 0.5 mLs (3 mg total) into the skin daily., Disp: 9 mL, Rfl: 2 .  PARoxetine (PAXIL-CR) 12.5 MG 24 hr tablet, TAKE 1 TABLET BY MOUTH EVERY DAY, Disp: 90 tablet, Rfl: 0 .  tretinoin (RETIN-A) 0.025 % cream, Apply 1 g topically at bedtime., Disp: , Rfl:   Allergies  Allergen Reactions  . Topamax [Topiramate] Other (See Comments)    Confusion , slurred speech   . Effexor [Venlafaxine]     Gas and bloating      ROS  Constitutional: Negative for fever or weight change.  Respiratory: Negative for cough and shortness of breath.   Cardiovascular: Negative for chest pain or palpitations.  Gastrointestinal: Negative for abdominal pain, no bowel changes.  Musculoskeletal: Positive  for gait problem had plantar fascitis surgery 06/2019 but no  joint  swelling.  Skin: Negative for rash.  Neurological: Negative for dizziness or headache.  No other specific complaints in a complete review of systems (except as listed in HPI above).  Objective  Vitals:   09/10/19 1005  BP: 104/70  Pulse: 72  Resp: 16  Temp: (!) 96.9 F (36.1 C)  TempSrc: Temporal  SpO2: 97%  Weight: 190 lb 3.2 oz (86.3 kg)  Height: 5' 5.5" (1.664 m)    Body mass index is 31.17 kg/m.  Physical Exam  Constitutional: Patient appears well-developed and well-nourished. No distress.  HENT: Head: Normocephalic and atraumatic. Ears: B TMs ok, no erythema or effusion; Nose: Nose normal. Mouth/Throat: not done Eyes: Conjunctivae and EOM are normal. Pupils are equal, round, and reactive to light. No scleral icterus.  Neck: Normal range of motion. Neck supple. No JVD present. No thyromegaly present.  Cardiovascular: Normal rate, regular rhythm and normal heart sounds.  No murmur heard. No BLE edema. Pulmonary/Chest: Effort normal and breath sounds normal. No respiratory distress. Abdominal: Soft. Bowel sounds are normal, no distension. There is no tenderness. no masses Breast:lumpy breasts no nipple discharge or rashes FEMALE GENITALIA:  Not done RECTAL: not done  Musculoskeletal: Normal range of motion, no joint effusions. No gross deformities Neurological: he is alert and oriented to person, place, and time. No cranial nerve deficit. Coordination, balance, strength, speech and gait are normal.  Skin: Skin is warm and dry. No rash noted. No erythema.  Psychiatric: Patient has a normal mood and affect. behavior is normal. Judgment and thought content normal.  Fall Risk: Fall Risk  09/10/2019 08/13/2019 07/02/2019 06/06/2019 12/11/2018  Falls in the past year? 0 0 0 0 0  Number falls in past yr: 0 0 0 0 0  Injury with Fall? 0 0 0 0 0  Comment - - - - -     Functional Status Survey: Is the patient deaf or have difficulty hearing?: No Does the patient have difficulty  seeing, even when wearing glasses/contacts?: No Does the patient have difficulty concentrating, remembering, or making decisions?: No Does the patient have difficulty walking or climbing stairs?: No Does the patient have difficulty dressing or bathing?: No Does the patient have difficulty doing errands alone such as visiting a doctor's office or shopping?: No   Assessment & Plan  1. Well adult exam   2. Vitamin D deficiency  - VITAMIN D 25 Hydroxy (Vit-D Deficiency, Fractures)  3. Lipid screening  - Lipid panel  4. Long-term use of high-risk medication  - COMPLETE METABOLIC PANEL WITH GFR  5. Encounter for screening mammogram for malignant neoplasm of breast  - MM Digital Screening; Future  -USPSTF grade A and B recommendations reviewed with patient; age-appropriate recommendations, preventive care, screening tests, etc discussed and encouraged; healthy living encouraged; see AVS for patient education given to patient -Discussed importance of 150 minutes of physical activity weekly, eat two servings of fish weekly, eat one serving of tree nuts ( cashews, pistachios, pecans, almonds.Marland Kitchen) every other day, eat 6 servings of fruit/vegetables daily and drink plenty of water and avoid sweet beverages.

## 2019-09-10 NOTE — Patient Instructions (Signed)

## 2019-09-11 ENCOUNTER — Other Ambulatory Visit: Payer: Self-pay | Admitting: Family Medicine

## 2019-09-11 LAB — COMPLETE METABOLIC PANEL WITH GFR
AG Ratio: 1.5 (calc) (ref 1.0–2.5)
ALT: 13 U/L (ref 6–29)
AST: 13 U/L (ref 10–30)
Albumin: 4.4 g/dL (ref 3.6–5.1)
Alkaline phosphatase (APISO): 52 U/L (ref 31–125)
BUN: 10 mg/dL (ref 7–25)
CO2: 30 mmol/L (ref 20–32)
Calcium: 9.9 mg/dL (ref 8.6–10.2)
Chloride: 105 mmol/L (ref 98–110)
Creat: 0.71 mg/dL (ref 0.50–1.10)
GFR, Est African American: 123 mL/min/{1.73_m2} (ref >=60)
GFR, Est Non African American: 107 mL/min/{1.73_m2} (ref >=60)
Globulin: 2.9 g/dL (calc) (ref 1.9–3.7)
Glucose, Bld: 70 mg/dL (ref 65–99)
Potassium: 4.7 mmol/L (ref 3.5–5.3)
Sodium: 142 mmol/L (ref 135–146)
Total Bilirubin: 0.4 mg/dL (ref 0.2–1.2)
Total Protein: 7.3 g/dL (ref 6.1–8.1)

## 2019-09-11 LAB — LIPID PANEL
Cholesterol: 184 mg/dL (ref <200)
HDL: 57 mg/dL (ref >=50)
LDL Cholesterol (Calc): 104 mg/dL (calc) — ABNORMAL HIGH
Non-HDL Cholesterol (Calc): 127 mg/dL (calc) (ref <130)
Total CHOL/HDL Ratio: 3.2 (calc) (ref <5.0)
Triglycerides: 124 mg/dL (ref <150)

## 2019-09-11 LAB — VITAMIN D 25 HYDROXY (VIT D DEFICIENCY, FRACTURES): Vit D, 25-Hydroxy: 13 ng/mL — ABNORMAL LOW (ref 30–100)

## 2019-09-11 MED ORDER — VITAMIN D (ERGOCALCIFEROL) 1.25 MG (50000 UNIT) PO CAPS: 50000.0000 [IU] | ORAL_CAPSULE | ORAL | 0 refills | Status: DC

## 2019-09-18 ENCOUNTER — Ambulatory Visit
Admission: RE | Admit: 2019-09-18 | Discharge: 2019-09-18 | Disposition: A | Discharge status: Home / Self Care | Payer: 59 | Source: Ambulatory Visit | Attending: Family Medicine | Admitting: Family Medicine

## 2019-09-18 DIAGNOSIS — Z1231 Encounter for screening mammogram for malignant neoplasm of breast: Secondary | ICD-10-CM | POA: Insufficient documentation

## 2019-09-22 ENCOUNTER — Other Ambulatory Visit: Payer: Self-pay | Admitting: Family Medicine

## 2019-09-22 DIAGNOSIS — R921 Mammographic calcification found on diagnostic imaging of breast: Secondary | ICD-10-CM

## 2019-09-22 DIAGNOSIS — R928 Other abnormal and inconclusive findings on diagnostic imaging of breast: Secondary | ICD-10-CM

## 2019-09-23 ENCOUNTER — Encounter: Payer: Self-pay | Admitting: Podiatry

## 2019-09-25 ENCOUNTER — Encounter: Payer: Self-pay | Admitting: Family Medicine

## 2019-10-01 ENCOUNTER — Encounter: Payer: Self-pay | Admitting: Podiatry

## 2019-10-01 ENCOUNTER — Telehealth: Payer: Self-pay | Admitting: Podiatry

## 2019-10-01 ENCOUNTER — Ambulatory Visit
Admission: RE | Admit: 2019-10-01 | Discharge: 2019-10-01 | Disposition: A | Discharge status: Home / Self Care | Payer: 59 | Source: Ambulatory Visit | Attending: Family Medicine | Admitting: Family Medicine

## 2019-10-01 DIAGNOSIS — R921 Mammographic calcification found on diagnostic imaging of breast: Secondary | ICD-10-CM | POA: Diagnosis present

## 2019-10-01 DIAGNOSIS — R928 Other abnormal and inconclusive findings on diagnostic imaging of breast: Secondary | ICD-10-CM | POA: Insufficient documentation

## 2019-10-01 NOTE — Telephone Encounter (Signed)
Patient came in today to get a note to return to work.  After discussing the restrictions for returning to work, there can not be any  restrictions to return to work. The patient has to go up and down the ladder. The patient is  Tammy Garza is she can do that task safely.The   patient decided to extend the days out of work until she has her  F/U appt with Dr. Posey Pronto scheduled for June 08,2021. A note was given to the patient.

## 2019-10-02 ENCOUNTER — Telehealth: Payer: Self-pay | Admitting: *Deleted

## 2019-10-02 ENCOUNTER — Other Ambulatory Visit: Payer: Self-pay | Admitting: Family Medicine

## 2019-10-02 DIAGNOSIS — R921 Mammographic calcification found on diagnostic imaging of breast: Secondary | ICD-10-CM

## 2019-10-02 DIAGNOSIS — R928 Other abnormal and inconclusive findings on diagnostic imaging of breast: Secondary | ICD-10-CM

## 2019-10-02 NOTE — Telephone Encounter (Signed)
I am returning your call.  You had left a message that you were needing a note to return to work.  "Yes, I was going to go back to work but they are not going to let me come back with any restrictions.  So I told Ms. Cozell that I would wait until I see Dr. Posey Pronto on my scheduled appointment on 10/21/2019 before I go back."  I wanted to mention to you that your [facility] form has a section that has to be completed by your employer.  "Yes, I'm aware and I picked that up a couple of days ago and took it to my job."

## 2019-10-02 NOTE — Telephone Encounter (Signed)
"  I need a note to return for work for the 20, May.  I work third shift so I need that tonight.  Please give me a call back."

## 2019-10-07 ENCOUNTER — Ambulatory Visit
Admission: RE | Admit: 2019-10-07 | Discharge: 2019-10-07 | Disposition: A | Discharge status: Home / Self Care | Payer: 59 | Source: Ambulatory Visit | Attending: Family Medicine | Admitting: Family Medicine

## 2019-10-07 DIAGNOSIS — R921 Mammographic calcification found on diagnostic imaging of breast: Secondary | ICD-10-CM

## 2019-10-07 DIAGNOSIS — R928 Other abnormal and inconclusive findings on diagnostic imaging of breast: Secondary | ICD-10-CM

## 2019-10-07 HISTORY — PX: BREAST BIOPSY: SHX20

## 2019-10-08 NOTE — Progress Notes (Signed)
[facility] ([facility])  [address], [address] [place], [place] [address] Phone: [phone]  Fax: [phone]   Office Visit:  10/15/2019  Referring physician: [name], MD  Chief Complaint: Tammy Garza is a 39 y.o. female with a DVT of the left lower extremity and protein C deficiency on Eliquis who is seen for 6 month assessment.   HPI: The patient was last seen in the hematology clinic on 04/16/2019 via telemedicine. At that time, she was doing well.  She had intermittent lower extremity swelling after working 12 hour shifts. Hematocrit was 38.0, hemoglobin 12.0, MCV 97.2, platelets 292,000, WBC 9700, ANC 5100. Calcium was 9.1.  Digital screening bilateral mammogram on 09/18/2019 revealed calcifications in the left breast.   Digital diagnostic left mammogram on 10/01/2019 revealed a new 9 mm group of new amorphous calcifications.   Left breast stereotactic core needle biopsy on 10/07/2019 revealed  benign breast tissue with cystic papillary apocrine metaplasia; negative for atypia and malignancy. No calcifications were identified. The absence of calcifications may refelect calcifications not surviving processing. Correlation with radiographic impression required.  Symptomatically, she is doing "well" today.  She says she had plantar fasciitis surgery on her left foot on 07/07/2019. She has not been back to work yet. She has a follow up appointment on 10/21/2019 with Dr. Posey Pronto and plans to return to work on 10/22/2019. She stopped anticoagulation 2 days before her surgery and she began taking Eliquis the evening of her surgery. She says the surgery went well. She admits to a little "tightness" in her left calf. She has slight numbness in her left foot near her heel. She does not take any calcium but she recently began taking vitamin D. She agrees to begin taking calcium again. She has not received the COVID-19 vaccine because she is worried about the side effects.     Past Medical History:  Diagnosis Date  . Anemia    h/o  . Cervical high risk human papillomavirus (HPV) DNA test positive   . Chronic gastritis   . Depression   . DVT (deep venous thrombosis) ([place]) 09/2016   left leg  . GERD (gastroesophageal reflux disease)   . H. pylori infection   . History of attempted suicide   . History of gastric polyp   . History of kidney stones    h/o  . Hyperhidrosis   . Migraines   . Protein C deficiency ([place])   . Rash   . Shift work sleep disorder     Past Surgical History:  Procedure Laterality Date  . ABDOMINAL HYSTERECTOMY    . ANTERIOR CRUCIATE LIGAMENT REPAIR Left   . BREAST BIOPSY Left 10/07/2019   left breast calcs x clip  . CHOLECYSTECTOMY    . CYSTOSCOPY  12/19/2016   Procedure: CYSTOSCOPY;  Surgeon: Gae Dry, MD;  Location: [facility] ORS;  Service: Gynecology;;  . ESOPHAGOGASTRODUODENOSCOPY (EGD) WITH PROPOFOL N/A 09/25/2017   Procedure: ESOPHAGOGASTRODUODENOSCOPY (EGD) WITH PROPOFOL;  Surgeon: Jonathon Bellows, MD;  Location: [facility] ENDOSCOPY;  Service: Gastroenterology;  Laterality: N/A;  . LAPAROSCOPIC HYSTERECTOMY Bilateral 12/19/2016   Procedure: HYSTERECTOMY TOTAL LAPAROSCOPIC BILATERAL SALPINGECTOMY;  Surgeon: Gae Dry, MD;  Location: [facility] ORS;  Service: Gynecology;  Laterality: Bilateral;  . PLANTAR FASCIA SURGERY  06/2019  . RECTAL EXAM UNDER ANESTHESIA N/A 02/26/2019   Procedure: RECTAL EXAM UNDER ANESTHESIA, EXCISION OF ANAL SKIN TAG;  Surgeon: Fredirick Maudlin, MD;  Location: [facility] ORS;  Service: General;  Laterality: N/A;  . TUBAL LIGATION  Family History  Problem Relation Age of Onset  . HIV Sister   . Cancer Sister 24       cervical cancer  . Asthma Daughter   . Protein C deficiency Daughter   . Hypertension Maternal Grandmother   . Hypertension Mother   . Obesity Mother   . Cancer Father 84       lung cancer  . Deep vein thrombosis Father   . Protein C deficiency Father   . COPD Father   .  Congestive Heart Failure Father   . Diabetes Brother   . COPD Paternal Grandmother   . Deep vein thrombosis Paternal Grandmother   . Protein C deficiency Daughter   . Breast cancer Neg Hx     Social History:  reports that she quit smoking about 12 years ago. Her smoking use included cigarettes. She started smoking about 17 years ago. She has a 1.25 pack-year smoking history. She has never used smokeless tobacco. She reports current alcohol use of about 1.0 standard drinks of alcohol per week. She reports current drug use. Drug: Marijuana. 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 She wears a mask at work. She has 2 daughters (ages 52 and 9). The patient is alone today.   Allergies:  Allergies  Allergen Reactions  . Topamax [Topiramate] Other (See Comments)    Confusion , slurred speech   . Effexor [Venlafaxine]     Gas and bloating     Current Medications: Current Outpatient Medications  Medication Sig Dispense Refill  . baclofen (LIORESAL) 10 MG tablet Take 10 mg by mouth 2 (two) times daily as needed (migraines).     . chlorproMAZINE (THORAZINE) 25 MG tablet Take 25 mg by mouth every 2 (two) hours as needed (migraines).     . clindamycin (CLEOCIN T) 1 % lotion Apply 1 g topically daily. On face     . ELIQUIS 5 MG TABS tablet TAKE 1 TABLET BY MOUTH TWICE A DAY 60 tablet 1  . gabapentin (NEURONTIN) 600 MG tablet Take 600 mg by mouth 2 (two) times daily.   5  . glycopyrrolate (ROBINUL) 1 MG tablet Take 3 mg by mouth daily.  2  . hydrOXYzine (ATARAX/VISTARIL) 10 MG tablet Take 1-2 tablets (10-20 mg total) by mouth at bedtime. 60 tablet 0  . Liraglutide -Weight Management (SAXENDA) 18 MG/3ML SOPN Inject 0.5 mLs (3 mg total) into the skin daily. 9 mL 2  . methocarbamol (ROBAXIN) 750 MG tablet Take 750 mg by  mouth at bedtime.    Marland Kitchen PARoxetine (PAXIL-CR) 12.5 MG 24 hr tablet TAKE 1 TABLET BY MOUTH EVERY DAY 90 tablet 0  . tretinoin (RETIN-A) 0.025 % cream Apply 1 g topically at bedtime.    . Vitamin D, Ergocalciferol, (DRISDOL) 1.25 MG (50000 UNIT) CAPS capsule Take 1 capsule (50,000 Units total) by mouth every 7 (seven) days. 12 capsule 0   No current facility-administered medications for this visit.    Review of Systems  Constitutional: Negative.  Negative for chills, diaphoresis, fever, malaise/fatigue and weight loss.       Feels "well".  HENT: Negative.  Negative for congestion, ear pain, hearing loss, nosebleeds, sinus pain and sore throat.   Eyes: Negative.  Negative for blurred vision and double vision.  Respiratory: Negative.  Negative for cough,  hemoptysis, sputum production and shortness of breath.   Cardiovascular: Positive for leg swelling (LLE; plantar fasciitis surgery was on 07/07/2019). Negative for chest pain and palpitations.  Gastrointestinal: Negative.  Negative for abdominal pain, blood in stool, constipation, diarrhea, heartburn, melena, nausea and vomiting.  Genitourinary: Negative.  Negative for dysuria, frequency, hematuria and urgency.  Musculoskeletal: Positive for myalgias (LLE tenderness). Negative for back pain, joint pain and neck pain.       S/p knee surgery; no issues with ambulation.  Skin: Negative.  Negative for itching and rash.  Neurological: Positive for sensory change. Negative for dizziness, tingling, speech change, focal weakness, weakness and headaches (migraine).  Endo/Heme/Allergies: Negative.  Does not bruise/bleed easily.  Psychiatric/Behavioral: Negative.  Negative for depression and memory loss. The patient is not nervous/anxious and does not have insomnia.   All other systems reviewed and are negative.   Performance status (ECOG):  1  Physical Exam Vitals and nursing note reviewed.  Constitutional:      General: She is not in acute  distress.    Appearance: She is well-developed and well-nourished. She is not diaphoretic.  HENT:     Head: Normocephalic and atraumatic.     Mouth/Throat:     Mouth: Oropharynx is clear and moist.      Comments: Short dark hair.  Mask. Eyes:     General: No scleral icterus.       Right eye: No discharge.        Left eye: No discharge.     Extraocular Movements: EOM normal.     Conjunctiva/sclera: Conjunctivae normal.     Pupils: Pupils are equal, round, and reactive to light.     Comments: Brown eyes.  Neck:     Vascular: No JVD.  Cardiovascular:     Rate and Rhythm: Normal rate and regular rhythm.     Pulses: Intact distal pulses.     Heart sounds: Normal heart sounds. No murmur heard.  No friction rub. No gallop.   Pulmonary:     Effort: Pulmonary effort is normal. No respiratory distress.     Breath sounds: Normal breath sounds. No wheezing or rales.  Chest:     Chest wall: No tenderness.  Abdominal:     General: Bowel sounds are normal. There is no distension.     Palpations: Abdomen is soft. There is no mass.     Tenderness: There is no abdominal tenderness. There is no guarding or rebound.  Musculoskeletal:        General: No tenderness or edema. Normal range of motion.     Cervical back: Normal range of motion and neck supple.     Comments: Left calf post-operative changes.  Lymphadenopathy:     Head:     Right side of head: No preauricular, posterior auricular or occipital adenopathy.     Left side of head: No preauricular, posterior auricular or occipital adenopathy.     Cervical: No cervical adenopathy.     Right cervical: No superficial or deep cervical adenopathy.    Left cervical: No superficial or deep cervical adenopathy.     Upper Body:  No axillary adenopathy present.    Right upper body: No supraclavicular adenopathy.     Left upper body: No supraclavicular adenopathy.  Skin:    General: Skin is warm and dry.     Coloration: Skin is not pale.      Findings: No erythema or rash.  Neurological:     Mental Status: She  is alert and oriented to person, place, and time.  Psychiatric:        Mood and Affect: Mood and affect normal.        Behavior: Behavior normal.        Thought Content: Thought content normal.        Judgment: Judgment normal.    Appointment on 10/15/2019  Component Date Value Ref Range Status  . WBC 10/15/2019 6.8  4.0 - 10.5 K/uL Final  . RBC 10/15/2019 4.13  3.87 - 5.11 MIL/uL Final  . Hemoglobin 10/15/2019 12.3  12.0 - 15.0 g/dL Final  . HCT 10/15/2019 38.8  36.0 - 46.0 % Final  . MCV 10/15/2019 93.9  80.0 - 100.0 fL Final  . MCH 10/15/2019 29.8  26.0 - 34.0 pg Final  . MCHC 10/15/2019 31.7  30.0 - 36.0 g/dL Final  . RDW 10/15/2019 12.9  11.5 - 15.5 % Final  . Platelets 10/15/2019 267  150 - 400 K/uL Final  . nRBC 10/15/2019 0.0  0.0 - 0.2 % Final  . Neutrophils Relative % 10/15/2019 46  % Final  . Neutro Abs 10/15/2019 3.2  1.7 - 7.7 K/uL Final  . Lymphocytes Relative 10/15/2019 41  % Final  . Lymphs Abs 10/15/2019 2.8  0.7 - 4.0 K/uL Final  . Monocytes Relative 10/15/2019 8  % Final  . Monocytes Absolute 10/15/2019 0.5  0.1 - 1.0 K/uL Final  . Eosinophils Relative 10/15/2019 4  % Final  . Eosinophils Absolute 10/15/2019 0.2  0.0 - 0.5 K/uL Final  . Basophils Relative 10/15/2019 1  % Final  . Basophils Absolute 10/15/2019 0.0  0.0 - 0.1 K/uL Final  . Immature Granulocytes 10/15/2019 0  % Final  . Abs Immature Granulocytes 10/15/2019 0.02  0.00 - 0.07 K/uL Final   Performed at [facility], [address], [place], [place] [address]  . Sodium 10/15/2019 137  135 - 145 mmol/L Final  . Potassium 10/15/2019 3.9  3.5 - 5.1 mmol/L Final  . Chloride 10/15/2019 104  98 - 111 mmol/L Final  . CO2 10/15/2019 25  22 - 32 mmol/L Final  . Glucose, Bld 10/15/2019 120* 70 - 99 mg/dL Final   Glucose reference range applies only to samples taken after fasting for at least 8 hours.  . BUN 10/15/2019 9  6 -  20 mg/dL Final  . Creatinine, Ser 10/15/2019 0.86  0.44 - 1.00 mg/dL Final  . Calcium 10/15/2019 8.6* 8.9 - 10.3 mg/dL Final  . Total Protein 10/15/2019 7.3  6.5 - 8.1 g/dL Final  . Albumin 10/15/2019 4.1  3.5 - 5.0 g/dL Final  . AST 10/15/2019 16  15 - 41 U/L Final  . ALT 10/15/2019 14  0 - 44 U/L Final  . Alkaline Phosphatase 10/15/2019 46  38 - 126 U/L Final  . Total Bilirubin 10/15/2019 0.6  0.3 - 1.2 mg/dL Final  . GFR calc non Af Amer 10/15/2019 >60  >60 mL/min Final  . GFR calc Af Amer 10/15/2019 >60  >60 mL/min Final  . Anion gap 10/15/2019 8  5 - 15 Final   Performed at [facility], [address], [place], [place] [address]    Assessment:  Tateum Robertson is a 39 y.o. female with protein C deficiencyand a left lower extremity DVTfollowing a 2 1/2 hour flight. She had been on DepoProvera x 1 year. She has a family history of thrombosis.  Left lower extremity duplexon 09/17/2016 revealed an acute appearing  thrombus in the calf veins of the left lower extremity (posterior tibial and peroneal veins). Bilateral lower extremity duplexon 11/20/2016 revealed no evidence of acute DVT within either lower extremity. There was some residual nonocclusive thrombus in the left peroneal vein, sequela of previous DVT on the prior scan. Left lower extremity duplexon 07/05/2017 revealed an isolated thrombus of the left peroneal vein in the calf similar to 11/20/2016. There was no acute thrombus. She is on Eliquis.   Hypercoagulable work-up on 05/15/2018and 07/19/2018revealed the following negative studies: Factor V Leiden, prothrombin gene mutation, lupus anticoagulant panel, anticardiolipin antibodies, beta2-glycoprotein, protein S antigen/activity, and ATIII antigen/activity. She has protein C deficiency. Labs on 11/30/2016 revealed a protein C total of 40% (60 - 150%) and protein C activity of 49% (73-180%). Repeat testing on 03/29/2017 revealed a protein C total of 51%  and a protein C activity of 51%.  She has a monoclonal gammopathy of unknown significance. SPEP on 09/26/2016 was negative. Immunofixation revealed an IgA monoclonal protein with lambda light chain specificity. Free light chain assay was normal. IgG was 698 ([phone]). IgA was 182 (87-352). 24 hour UPEP on 10/22/2016 revealed no monoclonal protein. SPEPon 03/29/2017, 10/05/2017, 10/08/2018, and 01/15/2019 were negative.Free light chain ratiowas normal on 09/26/2016 and 10/05/2017.  Chest, abdomen, and pelvic CTon 12/08/2016 revealed no lymphadenopathy or hepatosplenomegaly.  She underwent total laparoscopic hysterectomywith bilateral salpingectomy on 12/19/2016. She was off Eliquis x 3 days (2 days pre-op and day of surgery). She developed a small superficial vein thrombus.  She underwent ACL reconstruction with autograft on 05/25/2017. Left lower extremity duplexon 07/05/2017 revealed an isolated thrombus of the left peroneal vein in the calf. As compared to 11/20/2016, the thrombus within the left peroneal vein appeared similar. There was no acute thrombus.  LLE venous doppler ultrasound on 12/04/2018 revealed no evidence of acute or chronic DVT within the LLE.  Digital screening bilateral mammogram on 09/18/2019 revealed calcifications in the left breast.  Digital diagnostic left mammogram on 10/01/2019 revealed a new 9 mm group of new amorphous calcifications. Left breast stereotactic core needle biopsy on 10/07/2019 revealed  benign breast tissue with cystic papillary apocrine metaplasia; negative for atypia and malignancy. No calcifications were identified. The absence of calcifications may refelect calcifications not surviving processing. Correlation with radiographic impression required.  She has not received the COVID-19 vaccine because she is worried about the side effects.   Symptomatically, she denies any bruising or bleeding.  She has some left calf tightness after  interval plantar fasciitis surgery.  Exam is unremarkable.  Plan: 1.   Labs today: CBC with diff, CMP. 2.   Protein C deficiencyand left lower extremity DVT Clinically, she is doing well.             Continue Eliquis. 3.   Monoclonal gammopathy of unknown significance M-spike was 0 on 01/15/2019.  Immunofixation revealed an IgA monoclonal protein with lambda light chain specificity on 10/06/2016.   Significance unclear.  Patient remains asymptomatic.  She denies any B symptoms, issues with infections or bone pain.  Check myeloma panel in 01/2020.   If immunofixation negative, then further follow-up based on symptoms or concerning lab abnormalities. 4.   Hypocalcemia             Calcium 8.6.  Discussed calcium supplementation. 5.   Left breast calcifications  Discuss interval mammogram and biopsy.  Concern raised for lack of calcifications in biopsy.  Present at reast cancer tumor board on 10/27/2019. 6.    MD to  call patient after tumor board. 7.    RTC around 01/15/2020 for labs (SPEP). 8.    RTC in 6 months for MD assessment and labs (CBC with diff, CMP).  I discussed the assessment and treatment plan with the patient.The patient was provided an opportunity to ask questions and all were answered. The patient agreed with the plan and demonstrated an understanding of the instructions. The patient was advised to call back or seek an in person evaluation if the symptoms worsen or if the condition fails to improve as anticipated.   Nolon Stalls, MD, PhD  10/15/2019, 9:49 AM  I, Heywood Footman, am acting as a Education administrator for Lequita Asal, MD.   I, [place] Mike Gip, MD, have reviewed the above documentation for accuracy and completeness, and I agree with the above.

## 2019-10-09 LAB — SURGICAL PATHOLOGY

## 2019-10-15 ENCOUNTER — Inpatient Hospital Stay: Payer: 59

## 2019-10-15 ENCOUNTER — Encounter: Payer: Self-pay | Admitting: Hematology and Oncology

## 2019-10-15 ENCOUNTER — Inpatient Hospital Stay: Payer: 59 | Attending: Hematology and Oncology | Admitting: Hematology and Oncology

## 2019-10-15 ENCOUNTER — Other Ambulatory Visit: Payer: Self-pay

## 2019-10-15 VITALS — BP 105/79 | HR 67 | Temp 95.8°F | Resp 18 | Wt 189.6 lb

## 2019-10-15 DIAGNOSIS — Z87891 Personal history of nicotine dependence: Secondary | ICD-10-CM | POA: Diagnosis not present

## 2019-10-15 DIAGNOSIS — I825Z2 Chronic embolism and thrombosis of unspecified deep veins of left distal lower extremity: Secondary | ICD-10-CM

## 2019-10-15 DIAGNOSIS — Z7901 Long term (current) use of anticoagulants: Secondary | ICD-10-CM | POA: Diagnosis not present

## 2019-10-15 DIAGNOSIS — R928 Other abnormal and inconclusive findings on diagnostic imaging of breast: Secondary | ICD-10-CM | POA: Diagnosis not present

## 2019-10-15 DIAGNOSIS — D6859 Other primary thrombophilia: Secondary | ICD-10-CM | POA: Diagnosis present

## 2019-10-15 DIAGNOSIS — Z8249 Family history of ischemic heart disease and other diseases of the circulatory system: Secondary | ICD-10-CM | POA: Insufficient documentation

## 2019-10-15 DIAGNOSIS — Z79899 Other long term (current) drug therapy: Secondary | ICD-10-CM | POA: Insufficient documentation

## 2019-10-15 DIAGNOSIS — Z8349 Family history of other endocrine, nutritional and metabolic diseases: Secondary | ICD-10-CM | POA: Insufficient documentation

## 2019-10-15 DIAGNOSIS — Z801 Family history of malignant neoplasm of trachea, bronchus and lung: Secondary | ICD-10-CM | POA: Insufficient documentation

## 2019-10-15 DIAGNOSIS — Z86718 Personal history of other venous thrombosis and embolism: Secondary | ICD-10-CM | POA: Diagnosis not present

## 2019-10-15 DIAGNOSIS — K219 Gastro-esophageal reflux disease without esophagitis: Secondary | ICD-10-CM | POA: Diagnosis not present

## 2019-10-15 DIAGNOSIS — R921 Mammographic calcification found on diagnostic imaging of breast: Secondary | ICD-10-CM | POA: Diagnosis not present

## 2019-10-15 DIAGNOSIS — Z833 Family history of diabetes mellitus: Secondary | ICD-10-CM | POA: Diagnosis not present

## 2019-10-15 DIAGNOSIS — Z8049 Family history of malignant neoplasm of other genital organs: Secondary | ICD-10-CM | POA: Insufficient documentation

## 2019-10-15 DIAGNOSIS — F329 Major depressive disorder, single episode, unspecified: Secondary | ICD-10-CM | POA: Insufficient documentation

## 2019-10-15 DIAGNOSIS — Z90710 Acquired absence of both cervix and uterus: Secondary | ICD-10-CM | POA: Insufficient documentation

## 2019-10-15 DIAGNOSIS — D472 Monoclonal gammopathy: Secondary | ICD-10-CM

## 2019-10-15 LAB — CBC WITH DIFFERENTIAL/PLATELET
Abs Immature Granulocytes: 0.02 10*3/uL (ref 0.00–0.07)
Basophils Absolute: 0 10*3/uL (ref 0.0–0.1)
Basophils Relative: 1 %
Eosinophils Absolute: 0.2 10*3/uL (ref 0.0–0.5)
Eosinophils Relative: 4 %
HCT: 38.8 % (ref 36.0–46.0)
Hemoglobin: 12.3 g/dL (ref 12.0–15.0)
Immature Granulocytes: 0 %
Lymphocytes Relative: 41 %
Lymphs Abs: 2.8 10*3/uL (ref 0.7–4.0)
MCH: 29.8 pg (ref 26.0–34.0)
MCHC: 31.7 g/dL (ref 30.0–36.0)
MCV: 93.9 fL (ref 80.0–100.0)
Monocytes Absolute: 0.5 10*3/uL (ref 0.1–1.0)
Monocytes Relative: 8 %
Neutro Abs: 3.2 10*3/uL (ref 1.7–7.7)
Neutrophils Relative %: 46 %
Platelets: 267 10*3/uL (ref 150–400)
RBC: 4.13 MIL/uL (ref 3.87–5.11)
RDW: 12.9 % (ref 11.5–15.5)
WBC: 6.8 10*3/uL (ref 4.0–10.5)
nRBC: 0 % (ref 0.0–0.2)

## 2019-10-15 LAB — COMPREHENSIVE METABOLIC PANEL
ALT: 14 U/L (ref 0–44)
AST: 16 U/L (ref 15–41)
Albumin: 4.1 g/dL (ref 3.5–5.0)
Alkaline Phosphatase: 46 U/L (ref 38–126)
Anion gap: 8 (ref 5–15)
BUN: 9 mg/dL (ref 6–20)
CO2: 25 mmol/L (ref 22–32)
Calcium: 8.6 mg/dL — ABNORMAL LOW (ref 8.9–10.3)
Chloride: 104 mmol/L (ref 98–111)
Creatinine, Ser: 0.86 mg/dL (ref 0.44–1.00)
GFR calc Af Amer: >60 mL/min (ref >60)
GFR calc non Af Amer: >60 mL/min (ref >60)
Glucose, Bld: 120 mg/dL — ABNORMAL HIGH (ref 70–99)
Potassium: 3.9 mmol/L (ref 3.5–5.1)
Sodium: 137 mmol/L (ref 135–145)
Total Bilirubin: 0.6 mg/dL (ref 0.3–1.2)
Total Protein: 7.3 g/dL (ref 6.5–8.1)

## 2019-10-15 NOTE — Progress Notes (Signed)
plantar faciatis surgery on 07/07/19

## 2019-10-21 ENCOUNTER — Encounter: Payer: Self-pay | Admitting: Podiatry

## 2019-10-21 ENCOUNTER — Encounter: Payer: Self-pay | Admitting: *Deleted

## 2019-10-21 ENCOUNTER — Ambulatory Visit: Payer: 59 | Admitting: Podiatry

## 2019-10-21 ENCOUNTER — Other Ambulatory Visit: Payer: Self-pay

## 2019-10-21 DIAGNOSIS — M722 Plantar fascial fibromatosis: Secondary | ICD-10-CM

## 2019-10-21 DIAGNOSIS — M7662 Achilles tendinitis, left leg: Secondary | ICD-10-CM | POA: Diagnosis not present

## 2019-10-21 NOTE — Progress Notes (Signed)
Subjective:  Patient ID: Tammy Garza, female    DOB: [birth year],  MRN: [id number]  Chief Complaint  Patient presents with  . Foot Pain    follow up left achilles    "its doing much better, but still swells when I've been on it alot"     39 y.o. female returns for post-op check.  Patient presents status post left EPF and gastroc.  Patient states that she is doing really well.  She just has mild tenderness.  She is ready to return back to work.  She denies any other acute complaints.  She her physical therapy is doing well.  She has been discharged from physical therapy as well. Review of Systems: Negative except as noted in the HPI. Denies N/V/F/Ch.  Past Medical History:  Diagnosis Date  . Anemia    h/o  . Cervical high risk human papillomavirus (HPV) DNA test positive   . Chronic gastritis   . Depression   . DVT (deep venous thrombosis) ([place]) 09/2016   left leg  . GERD (gastroesophageal reflux disease)   . H. pylori infection   . History of attempted suicide   . History of gastric polyp   . History of kidney stones    h/o  . Hyperhidrosis   . Migraines   . Protein C deficiency ([place])   . Rash   . Shift work sleep disorder     Current Outpatient Medications:  .  baclofen (LIORESAL) 10 MG tablet, Take 10 mg by mouth 2 (two) times daily as needed (migraines). , Disp: , Rfl:  .  chlorproMAZINE (THORAZINE) 25 MG tablet, Take 25 mg by mouth every 2 (two) hours as needed (migraines). , Disp: , Rfl:  .  clindamycin (CLEOCIN T) 1 % lotion, Apply 1 g topically daily. On face , Disp: , Rfl:  .  ELIQUIS 5 MG TABS tablet, TAKE 1 TABLET BY MOUTH TWICE A DAY, Disp: 60 tablet, Rfl: 1 .  gabapentin (NEURONTIN) 600 MG tablet, Take 600 mg by mouth 2 (two) times daily. , Disp: , Rfl: 5 .  glycopyrrolate (ROBINUL) 1 MG tablet, Take 3 mg by mouth daily., Disp: , Rfl: 2 .  hydrOXYzine (ATARAX/VISTARIL) 10 MG tablet, Take 1-2 tablets (10-20 mg total) by mouth at bedtime., Disp: 60 tablet, Rfl:  0 .  Liraglutide -Weight Management (SAXENDA) 18 MG/3ML SOPN, Inject 0.5 mLs (3 mg total) into the skin daily., Disp: 9 mL, Rfl: 2 .  methocarbamol (ROBAXIN) 750 MG tablet, Take 750 mg by mouth at bedtime., Disp: , Rfl:  .  PARoxetine (PAXIL-CR) 12.5 MG 24 hr tablet, TAKE 1 TABLET BY MOUTH EVERY DAY, Disp: 90 tablet, Rfl: 0 .  tretinoin (RETIN-A) 0.025 % cream, Apply 1 g topically at bedtime., Disp: , Rfl:  .  Vitamin D, Ergocalciferol, (DRISDOL) 1.25 MG (50000 UNIT) CAPS capsule, Take 1 capsule (50,000 Units total) by mouth every 7 (seven) days., Disp: 12 capsule, Rfl: 0  Social History   Tobacco Use  Smoking Status Former Smoker  . Packs/day: 0.25  . Years: 5.00  . Pack years: 1.25  . Types: Cigarettes  . Start date: 05/15/2002  . Quit date: 05/16/2007  . Years since quitting: 12.4  Smokeless Tobacco Never Used    Allergies  Allergen Reactions  . Topamax [Topiramate] Other (See Comments)    Confusion , slurred speech   . Effexor [Venlafaxine]     Gas and bloating    Objective:  There were no vitals filed for this  visit. There is no height or weight on file to calculate BMI. Constitutional Well developed. Well nourished.  Vascular Foot warm and well perfused. Capillary refill normal to all digits.   Neurologic Normal speech. Oriented to person, place, and time. Epicritic sensation to light touch grossly present bilaterally.  Dermatologic  skin completely epithelialized without any clinical signs of infection.  Orthopedic:  No tenderness to palpation noted about the surgical site.  Ankle range of motion past 90 degrees to about 5 degrees of dorsiflexion to the left side.  No pain with dorsiflexion active and passive.   Radiographs: None Assessment:   1. Achilles tendinitis, left leg   2. Plantar fasciitis of left foot    Plan:  Patient was evaluated and treated and all questions answered.  S/p foot surgery left -Progressing as expected post-operatively. -XR: None -WB  Status: Weightbearing as tolerated in regular sneakers. -Sutures: None. -Medications: None -Read I feels comfortable discontinuing physical therapy as her pain has completely improved.  She is weightbearing as tolerated in regular sneakers.  She can return to work. -Patient is officially discharged from my care if there is any other foot and ankle issues arise in the future she can come back and see me.  Patient states understanding.  No follow-ups on file.

## 2019-11-01 ENCOUNTER — Other Ambulatory Visit: Payer: Self-pay | Admitting: Hematology and Oncology

## 2019-11-06 ENCOUNTER — Inpatient Hospital Stay (HOSPITAL_BASED_OUTPATIENT_CLINIC_OR_DEPARTMENT_OTHER): Payer: 59 | Admitting: Oncology

## 2019-11-06 ENCOUNTER — Telehealth: Payer: Self-pay | Admitting: *Deleted

## 2019-11-06 ENCOUNTER — Other Ambulatory Visit: Payer: Self-pay

## 2019-11-06 ENCOUNTER — Other Ambulatory Visit: Payer: Self-pay | Admitting: Family Medicine

## 2019-11-06 ENCOUNTER — Ambulatory Visit
Admission: RE | Admit: 2019-11-06 | Discharge: 2019-11-06 | Disposition: A | Discharge status: Home / Self Care | Payer: 59 | Source: Ambulatory Visit | Attending: Oncology | Admitting: Oncology

## 2019-11-06 ENCOUNTER — Encounter: Payer: Self-pay | Admitting: Hematology and Oncology

## 2019-11-06 VITALS — BP 108/68 | HR 65 | Temp 97.0°F | Resp 16 | Wt 177.0 lb

## 2019-11-06 DIAGNOSIS — M79661 Pain in right lower leg: Secondary | ICD-10-CM

## 2019-11-06 DIAGNOSIS — I825Z2 Chronic embolism and thrombosis of unspecified deep veins of left distal lower extremity: Secondary | ICD-10-CM

## 2019-11-06 DIAGNOSIS — Z86718 Personal history of other venous thrombosis and embolism: Secondary | ICD-10-CM | POA: Diagnosis not present

## 2019-11-06 DIAGNOSIS — M7989 Other specified soft tissue disorders: Secondary | ICD-10-CM | POA: Insufficient documentation

## 2019-11-06 NOTE — Progress Notes (Signed)
Symptom Management Consult note [facility]  [phone] [phone]  Patient Care Team: [name], MD as PCP - General (Family Medicine) Lequita Asal, MD as Referring Physician (Hematology and Oncology) Jonathon Bellows, MD as Consulting Physician (Gastroenterology) Dasher, Rayvon Char, MD (Dermatology) Orie Rout, MD as Referring Physician (Specialist) Jonathon Bellows, MD as Consulting Physician (Gastroenterology)   Name of the patient: Tammy Garza  [id number]  01-29-1981   Date of visit: 11/06/2019   Diagnosis-protein C deficiency and left lower extremity DVT following at 2-1/2-hour flight  Chief complaint/ Reason for visit-right calf pain and swelling  Heme/Onc history:  Oncology History   No history exists.   Interval history-Tammy Garza is a 39 year old female with past medical history significant for migraine, protein C deficiency, MGUS and DVT of left lower extremity after a 2-1/2-hour flight.  She has a positive family history for blood clots.  She has been followed by Dr. Mike Gip and has been on Eliquis 5 mg twice daily for over 2 years.  She was seen by Dr. Mike Gip on 04/16/2019 for follow-up.  She appeared to be doing well and was instructed to continue Eliquis as prescribed.  In the interim, she has been treated for plantar fasciitis of the left foot with steroid injections with temporary pain relief.  She was also given a boot and orthotics which unfortunately did not relieve her pain. Ultimately had left foot surgery with Dr. Posey Pronto which improved her pain and discomfort.  She presents to symptom management today for sudden onset right calf pain and tingling.  She noticed symptoms prior to going into work which worsened throughout the night (she works night shift).  She notes swelling on the lateral aspect of her right calf.  She denies injury.  She has been taking her Eliquis as prescribed.  She has not missed a  dose.  She denies any strenuous activity.  She is on her feet for 8 to 10 hours nightly for work.  She has not taken anything for pain.  She denies chest pain or shortness of breath.  She has good bowel movements.  ECOG FS:1 - Symptomatic but completely ambulatory  Review of systems- Review of Systems  Constitutional: Negative.  Negative for chills, fever, malaise/fatigue and weight loss.  HENT: Negative for congestion, ear pain and tinnitus.   Eyes: Negative.  Negative for blurred vision and double vision.  Respiratory: Negative.  Negative for cough, sputum production and shortness of breath.   Cardiovascular: Positive for leg swelling (Right leg/calf). Negative for chest pain and palpitations.  Gastrointestinal: Negative.  Negative for abdominal pain, constipation, diarrhea, nausea and vomiting.  Genitourinary: Negative for dysuria, frequency and urgency.  Musculoskeletal: Negative for back pain and falls.  Skin: Negative.  Negative for rash.  Neurological: Negative.  Negative for weakness and headaches.  Endo/Heme/Allergies: Negative.  Does not bruise/bleed easily.  Psychiatric/Behavioral: Negative.  Negative for depression. The patient is not nervous/anxious and does not have insomnia.      Current treatment-Eliquis  Allergies  Allergen Reactions  . Topamax [Topiramate] Other (See Comments)    Confusion , slurred speech   . Effexor [Venlafaxine]     Gas and bloating      Past Medical History:  Diagnosis Date  . Anemia    h/o  . Cervical high risk human papillomavirus (HPV) DNA test positive   . Chronic gastritis   . Depression   . DVT (deep venous thrombosis) ([place]) 09/2016   left  leg  . GERD (gastroesophageal reflux disease)   . H. pylori infection   . History of attempted suicide   . History of gastric polyp   . History of kidney stones    h/o  . Hyperhidrosis   . Migraines   . Protein C deficiency ([place])   . Rash   . Shift work sleep disorder      Past  Surgical History:  Procedure Laterality Date  . ABDOMINAL HYSTERECTOMY    . ANTERIOR CRUCIATE LIGAMENT REPAIR Left   . BREAST BIOPSY Left 10/07/2019   left breast calcs x clip  . CHOLECYSTECTOMY    . CYSTOSCOPY  12/19/2016   Procedure: CYSTOSCOPY;  Surgeon: Gae Dry, MD;  Location: [facility] ORS;  Service: Gynecology;;  . ESOPHAGOGASTRODUODENOSCOPY (EGD) WITH PROPOFOL N/A 09/25/2017   Procedure: ESOPHAGOGASTRODUODENOSCOPY (EGD) WITH PROPOFOL;  Surgeon: Jonathon Bellows, MD;  Location: [facility];  Service: Gastroenterology;  Laterality: N/A;  . LAPAROSCOPIC HYSTERECTOMY Bilateral 12/19/2016   Procedure: HYSTERECTOMY TOTAL LAPAROSCOPIC BILATERAL SALPINGECTOMY;  Surgeon: Gae Dry, MD;  Location: [facility] ORS;  Service: Gynecology;  Laterality: Bilateral;  . PLANTAR FASCIA SURGERY  06/2019  . RECTAL EXAM UNDER ANESTHESIA N/A 02/26/2019   Procedure: RECTAL EXAM UNDER ANESTHESIA, EXCISION OF ANAL SKIN TAG;  Surgeon: Fredirick Maudlin, MD;  Location: [facility] ORS;  Service: General;  Laterality: N/A;  . TUBAL LIGATION      Social History   Socioeconomic History  . Marital status: Divorced    Spouse name: Not on file  . Number of children: 2  . Years of education: 40  . Highest education level: Associate degree: occupational, Hotel manager, or vocational program  Occupational History  . Not on file  Tobacco Use  . Smoking status: Former Smoker    Packs/day: 0.25    Years: 5.00    Pack years: 1.25    Types: Cigarettes    Start date: 05/15/2002    Quit date: 05/16/2007    Years since quitting: 12.4  . Smokeless tobacco: Never Used  Vaping Use  . Vaping Use: Never used  Substance and Sexual Activity  . Alcohol use: Yes    Alcohol/week: 1.0 standard drink    Types: 1 Glasses of wine per week    Comment: occ wine none last 24 hrs  . Drug use: Yes    Types: Marijuana  . Sexual activity: Yes    Partners: Male    Birth control/protection: Surgical    Comment: hysterectomy   Other Topics  Concern  . Not on file  Social History Narrative   Separated since June 2016 - she has two children at home . The youngest is his daughter. Working at [employer] in [place].    Dating Gwyndolyn Saxon since 2015   Social Determinants of Health   Financial Resource Strain: Low Risk   . Difficulty of Paying Living Expenses: Not hard at all  Food Insecurity: No Food Insecurity  . Worried About Charity fundraiser in the Last Year: Never true  . Ran Out of Food in the Last Year: Never true  Transportation Needs: No Transportation Needs  . Lack of Transportation (Medical): No  . Lack of Transportation (Non-Medical): No  Physical Activity: Insufficiently Active  . Days of Exercise per Week: 4 days  . Minutes of Exercise per Session: 30 min  Stress: No Stress Concern Present  . Feeling of Stress : Only a little  Social Connections: Socially Isolated  . Frequency of Communication with Friends and Family:  More than three times a week  . Frequency of Social Gatherings with Friends and Family: Once a week  . Attends Religious Services: Never  . Active Member of Clubs or Organizations: No  . Attends Archivist Meetings: Never  . Marital Status: Divorced  Human resources officer Violence: Not At Risk  . Fear of Current or Ex-Partner: No  . Emotionally Abused: No  . Physically Abused: No  . Sexually Abused: No    Family History  Problem Relation Age of Onset  . HIV Sister   . Cancer Sister 27       cervical cancer  . Asthma Daughter   . Protein C deficiency Daughter   . Hypertension Maternal Grandmother   . Hypertension Mother   . Obesity Mother   . Cancer Father 57       lung cancer  . Deep vein thrombosis Father   . Protein C deficiency Father   . COPD Father   . Congestive Heart Failure Father   . Diabetes Brother   . COPD Paternal Grandmother   . Deep vein thrombosis Paternal Grandmother   . Protein C deficiency Daughter   . Breast cancer Neg Hx      Current Outpatient  Medications:  .  baclofen (LIORESAL) 10 MG tablet, Take 10 mg by mouth 2 (two) times daily as needed (migraines). , Disp: , Rfl:  .  chlorproMAZINE (THORAZINE) 25 MG tablet, Take 25 mg by mouth every 2 (two) hours as needed (migraines). , Disp: , Rfl:  .  clindamycin (CLEOCIN T) 1 % lotion, Apply 1 g topically daily. On face , Disp: , Rfl:  .  ELIQUIS 5 MG TABS tablet, TAKE 1 TABLET BY MOUTH TWICE A DAY, Disp: 60 tablet, Rfl: 1 .  gabapentin (NEURONTIN) 600 MG tablet, Take 600 mg by mouth 2 (two) times daily. , Disp: , Rfl: 5 .  glycopyrrolate (ROBINUL) 1 MG tablet, Take 3 mg by mouth daily., Disp: , Rfl: 2 .  hydrOXYzine (ATARAX/VISTARIL) 10 MG tablet, Take 1-2 tablets (10-20 mg total) by mouth at bedtime., Disp: 60 tablet, Rfl: 0 .  Liraglutide -Weight Management (SAXENDA) 18 MG/3ML SOPN, Inject 0.5 mLs (3 mg total) into the skin daily., Disp: 9 mL, Rfl: 2 .  methocarbamol (ROBAXIN) 750 MG tablet, Take 750 mg by mouth at bedtime., Disp: , Rfl:  .  tretinoin (RETIN-A) 0.025 % cream, Apply 1 g topically at bedtime., Disp: , Rfl:  .  Vitamin D, Ergocalciferol, (DRISDOL) 1.25 MG (50000 UNIT) CAPS capsule, Take 1 capsule (50,000 Units total) by mouth every 7 (seven) days., Disp: 12 capsule, Rfl: 0 .  PARoxetine (PAXIL-CR) 12.5 MG 24 hr tablet, TAKE 1 TABLET BY MOUTH EVERY DAY, Disp: 90 tablet, Rfl: 0  Physical exam:  Vitals:   11/06/19 1053  BP: 108/68  Pulse: 65  Resp: 16  Temp: (!) 97 F (36.1 C)  TempSrc: Tympanic  Weight: 177 lb (80.3 kg)   Physical Exam Constitutional:      Appearance: Normal appearance.  HENT:     Head: Normocephalic and atraumatic.  Eyes:     Pupils: Pupils are equal, round, and reactive to light.  Cardiovascular:     Rate and Rhythm: Normal rate and regular rhythm.     Heart sounds: Normal heart sounds. No murmur heard.   Pulmonary:     Effort: Pulmonary effort is normal.     Breath sounds: Normal breath sounds. No wheezing.  Abdominal:     General:  Bowel  sounds are normal. There is no distension.     Palpations: Abdomen is soft.     Tenderness: There is no abdominal tenderness.  Musculoskeletal:        General: Swelling present. Normal range of motion.     Cervical back: Normal range of motion.     Right lower leg: Edema present.  Skin:    General: Skin is warm and dry.     Findings: No rash.  Neurological:     Mental Status: She is alert and oriented to person, place, and time.  Psychiatric:        Judgment: Judgment normal.      CMP Latest Ref Rng & Units 10/15/2019  Glucose 70 - 99 mg/dL 120(H)  BUN 6 - 20 mg/dL 9  Creatinine 0.44 - 1.00 mg/dL 0.86  Sodium 135 - 145 mmol/L 137  Potassium 3.5 - 5.1 mmol/L 3.9  Chloride 98 - 111 mmol/L 104  CO2 22 - 32 mmol/L 25  Calcium 8.9 - 10.3 mg/dL 8.6(L)  Total Protein 6.5 - 8.1 g/dL 7.3  Total Bilirubin 0.3 - 1.2 mg/dL 0.6  Alkaline Phos 38 - 126 U/L 46  AST 15 - 41 U/L 16  ALT 0 - 44 U/L 14   CBC Latest Ref Rng & Units 10/15/2019  WBC 4.0 - 10.5 K/uL 6.8  Hemoglobin 12.0 - 15.0 g/dL 12.3  Hematocrit 36 - 46 % 38.8  Platelets 150 - 400 K/uL 267    No images are attached to the encounter.  No results found.   Assessment and plan- Patient is a 39 y.o. female who presents to symptom management with concerns of right calf pain and swelling.  Symptoms have been present for about 12 hours.  Unclear etiology of calf pain and swelling.  Given her history (protein C deficiency and history of left lower extremity DVT), will rule out blood clot with Doppler right lower extremity.  She has not missed any doses of her eliquis. Patient did recently have foot surgery with Dr. Posey Pronto.  She denies any respiratory symptoms including shortness of breath and chest pain.  Vital signs are stable.  Plan: Stat Doppler of right lower extremity We will call patient with results Continue Eliquis 5 mg BID.  Disposition: RTC as scheduled.    Visit Diagnosis 1. Chronic deep vein thrombosis (DVT) of  distal vein of left lower extremity (HCC)   2. Pain and swelling of right lower leg     Patient expressed understanding and was in agreement with this plan. She also understands that She can call clinic at any time with any questions, concerns, or complaints.   Greater than 50% was spent in counseling and coordination of care with this patient including but not limited to discussion of the relevant topics above (See A&P) including, but not limited to diagnosis and management of acute and chronic medical conditions.   Thank you for allowing me to participate in the care of this very pleasant patient.    Jacquelin Hawking, NP [place] [facility] Cell - [phone] Pager- [phone] 11/06/2019 3:48 PM

## 2019-11-06 NOTE — Telephone Encounter (Signed)
Spoke to patient via telephone. She agreed to come to the clinic at 11:00 to be evaluated in the symptom management clinic by Rulon Abide, NP.

## 2019-11-06 NOTE — Telephone Encounter (Signed)
Patient called reporting that she has pain and swelling in her right calf and that she is on Eliquis. She is asking to be evaluated Please advise

## 2019-11-07 NOTE — Progress Notes (Signed)
Re: Right leg and calf swelling/tenderness  Negative for DVT. Continue Eliquis as prescribed.   There is evidence of a bakers cyst that could be contributing to her pain.   [name], NP 11/07/2019 11:16 AM

## 2019-11-12 ENCOUNTER — Ambulatory Visit (INDEPENDENT_AMBULATORY_CARE_PROVIDER_SITE_OTHER): Payer: 59 | Admitting: Family Medicine

## 2019-11-12 ENCOUNTER — Other Ambulatory Visit: Payer: Self-pay

## 2019-11-12 ENCOUNTER — Encounter: Payer: Self-pay | Admitting: Family Medicine

## 2019-11-12 VITALS — BP 106/70 | HR 78 | Temp 97.1°F | Resp 16 | Ht 65.5 in | Wt 176.1 lb

## 2019-11-12 DIAGNOSIS — E669 Obesity, unspecified: Secondary | ICD-10-CM

## 2019-11-12 DIAGNOSIS — R928 Other abnormal and inconclusive findings on diagnostic imaging of breast: Secondary | ICD-10-CM

## 2019-11-12 DIAGNOSIS — I825Z2 Chronic embolism and thrombosis of unspecified deep veins of left distal lower extremity: Secondary | ICD-10-CM

## 2019-11-12 DIAGNOSIS — F411 Generalized anxiety disorder: Secondary | ICD-10-CM | POA: Diagnosis not present

## 2019-11-12 DIAGNOSIS — D6859 Other primary thrombophilia: Secondary | ICD-10-CM

## 2019-11-12 DIAGNOSIS — G4709 Other insomnia: Secondary | ICD-10-CM | POA: Diagnosis not present

## 2019-11-12 DIAGNOSIS — R61 Generalized hyperhidrosis: Secondary | ICD-10-CM

## 2019-11-12 DIAGNOSIS — G43109 Migraine with aura, not intractable, without status migrainosus: Secondary | ICD-10-CM

## 2019-11-12 DIAGNOSIS — M79604 Pain in right leg: Secondary | ICD-10-CM

## 2019-11-12 DIAGNOSIS — E559 Vitamin D deficiency, unspecified: Secondary | ICD-10-CM | POA: Diagnosis not present

## 2019-11-12 MED ORDER — TRAZODONE HCL 50 MG PO TABS: 25.0000 mg | ORAL_TABLET | Freq: Every evening | ORAL | 0 refills | Status: DC | PRN

## 2019-11-12 MED ORDER — OXYCODONE-ACETAMINOPHEN 10-325 MG PO TABS: 1.0000 | ORAL_TABLET | ORAL | 0 refills | Status: DC | PRN

## 2019-11-12 MED ORDER — VITAMIN D (ERGOCALCIFEROL) 1.25 MG (50000 UNIT) PO CAPS: 50000.0000 [IU] | ORAL_CAPSULE | ORAL | 1 refills | Status: DC

## 2019-11-12 NOTE — Progress Notes (Signed)
Name: Tammy Garza   MRN: [id number]    DOB: March 18, 1981   Date:11/12/2019       Progress Note  Subjective  Chief Complaint  Chief Complaint  Patient presents with  . Knee Pain    She has been having problem with pain in her right leg. Evaluated and dx with Baker's Cyst.   . Migraine    HPI  Left leg pain: she has a history of baker's cyst started seeing Dr. Harlow Mares for Baker's cyst earlier this year, had steroid injection, she was having difficulty bending her right knee, this past week she developed pain on right calf and swelling and she was worried about a blood clot ( she had a DVT on Left lower leg previously), Doppler US was done 11/06/2019 and it was negative for clot , but Baker's cyst was visualized. She went back to Dr. Alvira Philips yesterday and had another steroid injection right knee, was advised to wear a compression sleeve and elevated leg. She has been working, but sitting down and will have a long weekend off. Pain level is 6/10, cannot take nsaid's because of protein C deficiency and history of DVT. She is on metaxalone but not helping with the pain.   MGUS: she is seeing Dr. Mike Gip, last protein level and CBC were normal, unchanged   Plantar Fascitis: on left side, seeing podiatrist, Dr. Posey Pronto. She stands and walks a lot at work, she has to wear steel toe shoes. She states symptoms started after she switch positions at work from an area that had wood floors to concrete floors ( back in Dec 2020 ). She had steroid injection, splinting at night, boot during the day, and has insoles made , she had plantar fascia release on Feb 22 nd, and went back work June 9 th, 2021. She is still wearing a brace on left foot. She still has some pain, brace seems to be helping now   Protein C deficiency and history of DVT: still under the care of Dr. Mike Gip, taking anticoagulants, and no side effects, no pain on left calf   Migraine headaches:under the care of Dr. Domingo Cocking,  she is taking  gabapentin since 2018, episodes down to about 3  times per month. She has associated nausea, photophobia and phonophobia. Pain is described as temporal throbbing like, sometimes nuchal area. She has intermittent numbness on one side of her body, she takes baclofen and  .    Thyroid cyst:Us was reassuring no need for biopsy or repeat study. She denies dysphagia, cannot feel any lumps on her neck   Acne: she is now seeing Dr. Evorn Gong, using topical medication and it is helping with symptoms, face is clear at this time  Anxiety: she noticed increase of symptoms January 2021, with more hot flashes, and night sweats affecting her sleep. She was very edgy and losing her temper when things were out of order. She is still out of work because she had plantar fascitis surgery went back to work June 9 th, 2021 after she completely PT, she is worried about her health, and being able to continue working. Just went back and had bakers cyst rupture and pain on right calf. She has been on Paxil , initially immediate release but it was not lasting, we tried Effexor and she could not tolerate side effects of bloating and gassy. We changed to Paxil CR earlier this year  and seems to be controlling symptoms but is making her hungry, but doing better on saxenda, no  longer getting up to eat. She is happy with medication, she sets an alarm  Hyperthyrosis: improved with medication given by Dr. Evorn Gong  Obesity: started on Saxenda back in March 2021 weight was 192 lbs, she is only at 1.8 mg dose and is doing well, cannot tolerate higher because it causes a lot of indigestion.   Abnormal mammogram : negative biopsy, but has a small lump on left breast and is concerned about waiting a whole year for repeat mammogram, she has multiple medical problems, losing weight but is on medication for weight loss  Patient Active Problem List   Diagnosis Date Noted  . Abnormal mammogram 10/15/2019  . History of hemorrhoidectomy   .  Hypocalcemia 10/13/2018  . Protein C deficiency ([place]) 12/10/2016  . Monoclonal gammopathy of unknown significance ([facility]US) 11/30/2016  . History of DVT (deep vein thrombosis) 11/02/2016  . Elevated blood protein 09/26/2016  . Chronic deep vein thrombosis (DVT) of distal vein of left lower extremity ([place]) 09/17/2016  . Vitamin D deficiency 07/26/2015  . Thyroid cyst 07/26/2015  . History of Helicobacter pylori infection 04/22/2015  . Migraine with aura and without status migrainosus 04/22/2015  . Shift work sleep disorder 04/22/2015    Past Surgical History:  Procedure Laterality Date  . ABDOMINAL HYSTERECTOMY    . ANTERIOR CRUCIATE LIGAMENT REPAIR Left   . BREAST BIOPSY Left 10/07/2019   left breast calcs x clip  . CHOLECYSTECTOMY    . CYSTOSCOPY  12/19/2016   Procedure: CYSTOSCOPY;  Surgeon: Gae Dry, MD;  Location: [facility] ORS;  Service: Gynecology;;  . ESOPHAGOGASTRODUODENOSCOPY (EGD) WITH PROPOFOL N/A 09/25/2017   Procedure: ESOPHAGOGASTRODUODENOSCOPY (EGD) WITH PROPOFOL;  Surgeon: Jonathon Bellows, MD;  Location: [facility] ENDOSCOPY;  Service: Gastroenterology;  Laterality: N/A;  . LAPAROSCOPIC HYSTERECTOMY Bilateral 12/19/2016   Procedure: HYSTERECTOMY TOTAL LAPAROSCOPIC BILATERAL SALPINGECTOMY;  Surgeon: Gae Dry, MD;  Location: [facility] ORS;  Service: Gynecology;  Laterality: Bilateral;  . PLANTAR FASCIA SURGERY  06/2019  . RECTAL EXAM UNDER ANESTHESIA N/A 02/26/2019   Procedure: RECTAL EXAM UNDER ANESTHESIA, EXCISION OF ANAL SKIN TAG;  Surgeon: Fredirick Maudlin, MD;  Location: [facility] ORS;  Service: General;  Laterality: N/A;  . TUBAL LIGATION      Family History  Problem Relation Age of Onset  . HIV Sister   . Cancer Sister 20       cervical cancer  . Asthma Daughter   . Protein C deficiency Daughter   . Hypertension Maternal Grandmother   . Hypertension Mother   . Obesity Mother   . Cancer Father 97       lung cancer  . Deep vein thrombosis Father   . Protein C deficiency  Father   . COPD Father   . Congestive Heart Failure Father   . Diabetes Brother   . COPD Paternal Grandmother   . Deep vein thrombosis Paternal Grandmother   . Protein C deficiency Daughter   . Breast cancer Neg Hx     Social History   Tobacco Use  . Smoking status: Former Smoker    Packs/day: 0.25    Years: 5.00    Pack years: 1.25    Types: Cigarettes    Start date: 05/15/2002    Quit date: 05/16/2007    Years since quitting: 12.5  . Smokeless tobacco: Never Used  Substance Use Topics  . Alcohol use: Yes    Alcohol/week: 1.0 standard drink    Types: 1 Glasses of wine per week    Comment: occ  wine none last 24 hrs     Current Outpatient Medications:  .  baclofen (LIORESAL) 10 MG tablet, Take 10 mg by mouth 2 (two) times daily as needed (migraines). , Disp: , Rfl:  .  chlorproMAZINE (THORAZINE) 25 MG tablet, Take 25 mg by mouth every 2 (two) hours as needed (migraines). , Disp: , Rfl:  .  clindamycin (CLEOCIN T) 1 % lotion, Apply 1 g topically daily. On face , Disp: , Rfl:  .  ELIQUIS 5 MG TABS tablet, TAKE 1 TABLET BY MOUTH TWICE A DAY, Disp: 60 tablet, Rfl: 1 .  gabapentin (NEURONTIN) 600 MG tablet, Take 600 mg by mouth 2 (two) times daily. , Disp: , Rfl: 5 .  glycopyrrolate (ROBINUL) 1 MG tablet, Take 3 mg by mouth daily. For hyperhidrosis , Disp: , Rfl: 2 .  Liraglutide -Weight Management (SAXENDA) 18 MG/3ML SOPN, Inject 0.5 mLs (3 mg total) into the skin daily., Disp: 9 mL, Rfl: 2 .  methocarbamol (ROBAXIN) 750 MG tablet, Take 750 mg by mouth at bedtime., Disp: , Rfl:  .  PARoxetine (PAXIL-CR) 12.5 MG 24 hr tablet, TAKE 1 TABLET BY MOUTH EVERY DAY, Disp: 90 tablet, Rfl: 0 .  tretinoin (RETIN-A) 0.025 % cream, Apply 1 g topically at bedtime., Disp: , Rfl:  .  Vitamin D, Ergocalciferol, (DRISDOL) 1.25 MG (50000 UNIT) CAPS capsule, Take 1 capsule (50,000 Units total) by mouth every 7 (seven) days., Disp: 12 capsule, Rfl: 1 .  oxyCODONE-acetaminophen (PERCOCET) 10-325 MG  tablet, Take 1 tablet by mouth every 4 (four) hours as needed for pain., Disp: 30 tablet, Rfl: 0 .  traZODone (DESYREL) 50 MG tablet, Take 0.5-1 tablets (25-50 mg total) by mouth at bedtime as needed for sleep., Disp: 30 tablet, Rfl: 0  Allergies  Allergen Reactions  . Topamax [Topiramate] Other (See Comments)    Confusion , slurred speech   . Effexor [Venlafaxine]     Gas and bloating     I personally reviewed active problem list, medication list, allergies, family history, social history, notes from last encounter with the patient/caregiver today.   ROS  Constitutional: Negative for fever , positive for weight change.  Respiratory: Negative for cough and shortness of breath.   Cardiovascular: Negative for chest pain or palpitations.  Gastrointestinal: Negative for abdominal pain, no bowel changes.  Musculoskeletal: positive for gait problem and  joint swelling.  Skin: Negative for rash.  Neurological: Negative for dizziness , intermittent headache.  No other specific complaints in a complete review of systems (except as listed in HPI above).  Objective  Vitals:   11/12/19 0848  BP: 106/70  Pulse: 78  Resp: 16  Temp: (!) 97.1 F (36.2 C)  TempSrc: Temporal  SpO2: 99%  Weight: 176 lb 1.6 oz (79.9 kg)  Height: 5' 5.5" (1.664 m)    Body mass index is 28.86 kg/m.  Physical Exam  Constitutional: Patient appears well-developed and well-nourished. Overweight. No distress.  HEENT: head atraumatic, normocephalic, pupils equal and reactive to light Cardiovascular: Normal rate, regular rhythm and normal heart sounds.  No murmur heard. No BLE edema. Pulmonary/Chest: Effort normal and breath sounds normal. No respiratory distress. Abdominal: Soft.  There is no tenderness. Breast: lumpy breast, oval lump at 2 o'clock of left breast, no nipple discharge  Muscular Skeletal: edema right ankle, brace on left ankle, sleeve on right knee/leg  Psychiatric: Patient has a normal mood  and affect. behavior is normal. Judgment and thought content normal.  Recent Results (from the  past 2160 hour(s))  Lipid panel     Status: Abnormal   Collection Time: 09/10/19 10:58 AM  Result Value Ref Range   Cholesterol 184 <200 mg/dL   HDL 57 > OR = 50 mg/dL   Triglycerides 124 <150 mg/dL   LDL Cholesterol (Calc) 104 (H) mg/dL (calc)    Comment: Reference range: <100 . Desirable range <100 mg/dL for primary prevention;   <70 mg/dL for patients with CHD or diabetic patients  with > or = 2 CHD risk factors. Marland Kitchen LDL-C is now calculated using the Martin-Hopkins  calculation, which is a validated novel method providing  better accuracy than the Friedewald equation in the  estimation of LDL-C.  Cresenciano Genre et al. Annamaria Helling. 6213;086(57): 2061-2068  (http://education.[facility].com/faq/FAQ164)    Total CHOL/HDL Ratio 3.2 <5.0 (calc)   Non-HDL Cholesterol (Calc) 127 <130 mg/dL (calc)    Comment: For patients with diabetes plus 1 major ASCVD risk  factor, treating to a non-HDL-C goal of <100 mg/dL  (LDL-C of <70 mg/dL) is considered a therapeutic  option.   COMPLETE METABOLIC PANEL WITH GFR     Status: None   Collection Time: 09/10/19 10:58 AM  Result Value Ref Range   Glucose, Bld 70 65 - 99 mg/dL    Comment: .            Fasting reference interval .    BUN 10 7 - 25 mg/dL   Creat 0.71 0.50 - 1.10 mg/dL   GFR, Est Non African American 107 > OR = 60 mL/min/1.55m2   GFR, Est African American 123 > OR = 60 mL/min/1.47m2   BUN/Creatinine Ratio NOT APPLICABLE 6 - 22 (calc)   Sodium 142 135 - 146 mmol/L   Potassium 4.7 3.5 - 5.3 mmol/L   Chloride 105 98 - 110 mmol/L   CO2 30 20 - 32 mmol/L   Calcium 9.9 8.6 - 10.2 mg/dL   Total Protein 7.3 6.1 - 8.1 g/dL   Albumin 4.4 3.6 - 5.1 g/dL   Globulin 2.9 1.9 - 3.7 g/dL (calc)   AG Ratio 1.5 1.0 - 2.5 (calc)   Total Bilirubin 0.4 0.2 - 1.2 mg/dL   Alkaline phosphatase (APISO) 52 31 - 125 U/L   AST 13 10 - 30 U/L   ALT 13 6 - 29 U/L   VITAMIN D 25 Hydroxy (Vit-D Deficiency, Fractures)     Status: Abnormal   Collection Time: 09/10/19 10:58 AM  Result Value Ref Range   Vit D, 25-Hydroxy 13 (L) 30 - 100 ng/mL    Comment: Vitamin D Status         25-OH Vitamin D: . Deficiency:                    <20 ng/mL Insufficiency:             20 - 29 ng/mL Optimal:                 > or = 30 ng/mL . For 25-OH Vitamin D testing on patients on  D2-supplementation and patients for whom quantitation  of D2 and D3 fractions is required, the Quest[employer]ureD(TM) 25-OH VIT D, (D2,D3), LC/MS/MS is recommended: order  code [phone] (patients >20yrs). See Note 1 . Note 1 . For additional information, please refer to  http://education.[facility].com/faq/FAQ199  (This link is being provided for informational/ educational purposes only.)   Surgical pathology     Status: None   Collection Time: 10/07/19 12:04 PM  Result Value  Ref Range   SURGICAL PATHOLOGY      SURGICAL PATHOLOGY CASE: [id number] PATIENT: Bhakti Downs Surgical Pathology Report     Specimen Submitted: A. Breast, left  Clinical History: Faint amorphous calcifications, 9 mm group, in lateral left breast (lower outer quadrant).  Favor benign, likely [place]      DIAGNOSIS: A.  BREAST WITH CALCIFICATIONS, LEFT LOWER OUTER QUADRANT; STEREOTACTIC BIOPSY: - BENIGN BREAST TISSUE WITH CYSTIC PAPILLARY APOCRINE METAPLASIA. - NEGATIVE FOR ATYPIA AND MALIGNANCY. - MULTIPLE DEEPER SECTIONS WERE EXAMINED. - SEE COMMENT.  Comment: Slides from the tissue submitted in portions C, D, and E of the Brevera device demonstrate breast tissue with cystic areas lined by benign mammary epithelium with apocrine metaplasia that is focally arranged into small papillary structures. Multiple deeper sections were examined and the tissue was polarized in an attempt to identified calcifications. While not identified in this case, calcifications are often associated with areas o f  apocrine metaplasia. The absence of calcifications in this case may reflect calcifications not surviving tissue processing. These histologic findings are benign. Correlation with radiographic impression is required.  GROSS DESCRIPTION: A. Labeled: Left breast amorphous calcs lower outer quadrant Received: in a formalin-filled Brevera collection device Specimen radiograph image(s) available for review, portions C-E marked on the device Time/Date in fixative: 11:45 AM on 10/07/2019 Cold ischemic time: Less than 5 minutes Total fixation time: 6 1/2 hours Core pieces: Multiple Measurement: 1.5 x 1 x 0.7 cm Description / comments: Aggregate of pink-yellow cylindrically shaped tissue fragments in the A through E portions of the device Inked: Green Entirely submitted in cassette(s): 1-portion C 2-portion D 3-portion E 4-portion A-B  Final Diagnosis performed by Quay Burow, MD.   Electronically signed 10/09/2019 10:03:07AM The electronic signature indicates that the  named Attending Pathologist has evaluated the specimen Technical component performed at [facility], [address], [place], [place] [address] Lab: [phone] Dir: Rush Farmer, MD, MMM  Professional component performed at [facility], [facility], [place], [place], [place] [address] Lab: [phone] Dir: Dellia Nims. Rubinas, MD   CBC with Differential/Platelet     Status: None   Collection Time: 10/15/19  8:50 AM  Result Value Ref Range   WBC 6.8 4.0 - 10.5 K/uL   RBC 4.13 3.87 - 5.11 MIL/uL   Hemoglobin 12.3 12.0 - 15.0 g/dL   HCT 38.8 36 - 46 %   MCV 93.9 80.0 - 100.0 fL   MCH 29.8 26.0 - 34.0 pg   MCHC 31.7 30.0 - 36.0 g/dL   RDW 12.9 11.5 - 15.5 %   Platelets 267 150 - 400 K/uL   nRBC 0.0 0.0 - 0.2 %   Neutrophils Relative % 46 %   Neutro Abs 3.2 1.7 - 7.7 K/uL   Lymphocytes Relative 41 %   Lymphs Abs 2.8 0.7 - 4.0 K/uL   Monocytes Relative 8 %   Monocytes Absolute 0.5 0 - 1 K/uL    Eosinophils Relative 4 %   Eosinophils Absolute 0.2 0 - 0 K/uL   Basophils Relative 1 %   Basophils Absolute 0.0 0 - 0 K/uL   Immature Granulocytes 0 %   Abs Immature Granulocytes 0.02 0.00 - 0.07 K/uL    Comment: Performed at [facility], [address], [place], [place] [address]  Comprehensive metabolic panel     Status: Abnormal   Collection Time: 10/15/19  8:50 AM  Result Value Ref Range   Sodium 137 135 - 145 mmol/L  Potassium 3.9 3.5 - 5.1 mmol/L   Chloride 104 98 - 111 mmol/L   CO2 25 22 - 32 mmol/L   Glucose, Bld 120 (H) 70 - 99 mg/dL    Comment: Glucose reference range applies only to samples taken after fasting for at least 8 hours.   BUN 9 6 - 20 mg/dL   Creatinine, Ser 0.86 0.44 - 1.00 mg/dL   Calcium 8.6 (L) 8.9 - 10.3 mg/dL   Total Protein 7.3 6.5 - 8.1 g/dL   Albumin 4.1 3.5 - 5.0 g/dL   AST 16 15 - 41 U/L   ALT 14 0 - 44 U/L   Alkaline Phosphatase 46 38 - 126 U/L   Total Bilirubin 0.6 0.3 - 1.2 mg/dL   GFR calc non Af Amer >60 >60 mL/min   GFR calc Af Amer >60 >60 mL/min   Anion gap 8 5 - 15    Comment: Performed at [facility], [address], [place], [place] [address]      PHQ2/9: Depression screen [facility] 2/9 11/12/2019 09/10/2019 08/13/2019 07/02/2019 06/06/2019  Decreased Interest 0 0 0 0 0  Down, Depressed, Hopeless 0 0 0 0 0  PHQ - 2 Score 0 0 0 0 0  Altered sleeping 0 0 0 0 3  Tired, decreased energy 0 0 0 0 0  Change in appetite 0 0 0 0 0  Feeling bad or failure about yourself  0 0 0 0 0  Trouble concentrating 0 0 0 0 0  Moving slowly or fidgety/restless 0 0 0 0 0  Suicidal thoughts 0 0 0 0 0  PHQ-9 Score 0 0 0 0 3  Difficult doing work/chores - - - Not difficult at all Not difficult at all  Some recent data might be hidden    phq 9 is negative   Fall Risk: Fall Risk  11/12/2019 09/10/2019 08/13/2019 07/02/2019 06/06/2019  Falls in the past year? 0 0 0 0 0  Number falls in past yr: 0 0 0 0 0  Injury with Fall? 0 0 0 0 0   Comment - - - - -    Functional Status Survey: Is the patient deaf or have difficulty hearing?: No Does the patient have difficulty seeing, even when wearing glasses/contacts?: No Does the patient have difficulty concentrating, remembering, or making decisions?: No Does the patient have difficulty walking or climbing stairs?: No Does the patient have difficulty dressing or bathing?: No Does the patient have difficulty doing errands alone such as visiting a doctor's office or shopping?: No    Assessment & Plan  1. Acute leg pain, right  - oxyCODONE-acetaminophen (PERCOCET) 10-325 MG tablet; Take 1 tablet by mouth every 4 (four) hours as needed for pain.  Dispense: 30 tablet; Refill: 0  2. Other insomnia  - traZODone (DESYREL) 50 MG tablet; Take 0.5-1 tablets (25-50 mg total) by mouth at bedtime as needed for sleep.  Dispense: 30 tablet; Refill: 0  3. Vitamin D deficiency  - Vitamin D, Ergocalciferol, (DRISDOL) 1.25 MG (50000 UNIT) CAPS capsule; Take 1 capsule (50,000 Units total) by mouth every 7 (seven) days.  Dispense: 12 capsule; Refill: 1  4. GAD (generalized anxiety disorder)   5. Obesity (BMI 30.0-34.9)  History of obesity, taking medication and is now overweight.   6. Protein C deficiency ([place])  On Eliquis and sees hematologist   7. Migraine with aura and without status migrainosus, not intractable  Keep follow up with Dr. Domingo Cocking  8. Chronic deep vein thrombosis (DVT) of distal vein of left lower extremity (HCC)  On Eliquis   9. Hyperhidrosis

## 2019-11-13 ENCOUNTER — Encounter: Payer: Self-pay | Admitting: General Surgery

## 2019-11-13 ENCOUNTER — Ambulatory Visit (INDEPENDENT_AMBULATORY_CARE_PROVIDER_SITE_OTHER): Payer: 59 | Admitting: General Surgery

## 2019-11-13 VITALS — BP 122/78 | HR 70 | Temp 98.1°F | Resp 14 | Ht 65.5 in | Wt 175.0 lb

## 2019-11-13 DIAGNOSIS — Z711 Person with feared health complaint in whom no diagnosis is made: Secondary | ICD-10-CM | POA: Diagnosis not present

## 2019-11-13 NOTE — Patient Instructions (Signed)
Continue self breast exams. Call office for any new breast issues or concerns.  Follow-up with our office as needed.  Please call and ask to speak with a nurse if you develop questions or concerns.

## 2019-11-13 NOTE — Progress Notes (Signed)
Patient ID: Tammy Garza, female   DOB: [birth year], 39 y.o.   MRN: [id number]  Chief Complaint  Patient presents with  . Other    HPI Tammy Garza is a 39 y.o. female.   I had previously seen her for hemorrhoidal disease.  She has been referred by Dr. Ancil Boozer for further evaluation of a lump in her left breast.  Tammy Garza underwent routine screening mammography on Sep 18, 2019.  She was recalled for further imaging of her calcifications found in the left breast subsequently underwent stereotactic core biopsy.  The pathology findings were benign.  She saw her primary care provider yesterday and reported a new lump at about 2:00 in the left breast.  She states that it was not there prior to her screening mammogram and she did not notice it until after she had the biopsy done.  It is not tender.  She has not noticed any other changes in either breast.  No nipple discharge.  No additional imaging has been obtained.   Past Medical History:  Diagnosis Date  . Anemia    h/o  . Cervical high risk human papillomavirus (HPV) DNA test positive   . Chronic gastritis   . Depression   . DVT (deep venous thrombosis) ([place]) 09/2016   left leg  . GERD (gastroesophageal reflux disease)   . H. pylori infection   . History of attempted suicide   . History of gastric polyp   . History of kidney stones    h/o  . Hyperhidrosis   . Migraines   . Protein C deficiency ([place])   . Rash   . Shift work sleep disorder     Past Surgical History:  Procedure Laterality Date  . ABDOMINAL HYSTERECTOMY    . ANTERIOR CRUCIATE LIGAMENT REPAIR Left   . BREAST BIOPSY Left 10/07/2019   left breast calcs x clip  . CHOLECYSTECTOMY    . CYSTOSCOPY  12/19/2016   Procedure: CYSTOSCOPY;  Surgeon: Gae Dry, MD;  Location: [facility] ORS;  Service: Gynecology;;  . ESOPHAGOGASTRODUODENOSCOPY (EGD) WITH PROPOFOL N/A 09/25/2017   Procedure: ESOPHAGOGASTRODUODENOSCOPY (EGD) WITH PROPOFOL;  Surgeon: Jonathon Bellows, MD;  Location:  [facility] ENDOSCOPY;  Service: Gastroenterology;  Laterality: N/A;  . LAPAROSCOPIC HYSTERECTOMY Bilateral 12/19/2016   Procedure: HYSTERECTOMY TOTAL LAPAROSCOPIC BILATERAL SALPINGECTOMY;  Surgeon: Gae Dry, MD;  Location: [facility] ORS;  Service: Gynecology;  Laterality: Bilateral;  . PLANTAR FASCIA SURGERY  06/2019  . RECTAL EXAM UNDER ANESTHESIA N/A 02/26/2019   Procedure: RECTAL EXAM UNDER ANESTHESIA, EXCISION OF ANAL SKIN TAG;  Surgeon: Fredirick Maudlin, MD;  Location: [facility] ORS;  Service: General;  Laterality: N/A;  . TUBAL LIGATION      Family History  Problem Relation Age of Onset  . HIV Sister   . Cancer Sister 1       cervical cancer  . Asthma Daughter   . Protein C deficiency Daughter   . Hypertension Maternal Grandmother   . Hypertension Mother   . Obesity Mother   . Cancer Father 36       lung cancer  . Deep vein thrombosis Father   . Protein C deficiency Father   . COPD Father   . Congestive Heart Failure Father   . Diabetes Brother   . COPD Paternal Grandmother   . Deep vein thrombosis Paternal Grandmother   . Protein C deficiency Daughter   . Breast cancer Neg Hx     Social History Social History   Tobacco Use  .  Smoking status: Former Smoker    Packs/day: 0.25    Years: 5.00    Pack years: 1.25    Types: Cigarettes    Start date: 05/15/2002    Quit date: 05/16/2007    Years since quitting: 12.5  . Smokeless tobacco: Never Used  Vaping Use  . Vaping Use: Never used  Substance Use Topics  . Alcohol use: Yes    Alcohol/week: 1.0 standard drink    Types: 1 Glasses of wine per week    Comment: occ wine none last 24 hrs  . Drug use: Yes    Types: Marijuana    Allergies  Allergen Reactions  . Topamax [Topiramate] Other (See Comments)    Confusion , slurred speech   . Effexor [Venlafaxine]     Gas and bloating     Current Outpatient Medications  Medication Sig Dispense Refill  . baclofen (LIORESAL) 10 MG tablet Take 10 mg by mouth 2 (two) times daily  as needed (migraines).     . chlorproMAZINE (THORAZINE) 25 MG tablet Take 25 mg by mouth every 2 (two) hours as needed (migraines).     . clindamycin (CLEOCIN T) 1 % lotion Apply 1 g topically daily. On face     . ELIQUIS 5 MG TABS tablet TAKE 1 TABLET BY MOUTH TWICE A DAY 60 tablet 1  . gabapentin (NEURONTIN) 600 MG tablet Take 600 mg by mouth 2 (two) times daily.   5  . glycopyrrolate (ROBINUL) 1 MG tablet Take 3 mg by mouth daily. For hyperhidrosis   2  . Liraglutide -Weight Management (SAXENDA) 18 MG/3ML SOPN Inject 0.5 mLs (3 mg total) into the skin daily. 9 mL 2  . methocarbamol (ROBAXIN) 750 MG tablet Take 750 mg by mouth at bedtime.    Marland Kitchen oxyCODONE-acetaminophen (PERCOCET) 10-325 MG tablet Take 1 tablet by mouth every 4 (four) hours as needed for pain. 30 tablet 0  . PARoxetine (PAXIL-CR) 12.5 MG 24 hr tablet TAKE 1 TABLET BY MOUTH EVERY DAY 90 tablet 0  . traZODone (DESYREL) 50 MG tablet Take 0.5-1 tablets (25-50 mg total) by mouth at bedtime as needed for sleep. 30 tablet 0  . tretinoin (RETIN-A) 0.025 % cream Apply 1 g topically at bedtime.    . Vitamin D, Ergocalciferol, (DRISDOL) 1.25 MG (50000 UNIT) CAPS capsule Take 1 capsule (50,000 Units total) by mouth every 7 (seven) days. 12 capsule 1   No current facility-administered medications for this visit.    Review of Systems Review of Systems  All other systems reviewed and are negative.   Blood pressure 122/78, pulse 70, temperature 98.1 F (36.7 C), resp. rate 14, height 5' 5.5" (1.664 m), weight 175 lb (79.4 kg), last menstrual period 01/04/2016, SpO2 99 %. Body mass index is 28.68 kg/m.  Physical Exam Physical Exam Vitals reviewed. Exam conducted with a chaperone present.  Constitutional:      Appearance: Normal appearance. She is normal weight.  HENT:     Head: Normocephalic and atraumatic.     Nose:     Comments: Covered with a mask secondary to COVID-19 precautions    Mouth/Throat:     Comments: Covered with a  mask secondary to COVID-19 precautions Eyes:     General: No scleral icterus.       Right eye: No discharge.        Left eye: No discharge.     Conjunctiva/sclera: Conjunctivae normal.  Neck:     Comments: No palpable cervical or supraclavicular  lymphadenopathy.  The trachea is midline.  There are no dominant thyroid masses appreciated and the gland was freely with deglutition. Cardiovascular:     Rate and Rhythm: Normal rate and regular rhythm.  Pulmonary:     Effort: Pulmonary effort is normal.     Breath sounds: Normal breath sounds.  Chest:     Breasts: Breasts are symmetrical.        Right: Normal.        Left: Normal.       Comments: On physical examination, the area of clinical concern appears consistent with the scar from her stereotactic core biopsy.  It is contained within the skin and does not involve the breast parenchyma. Abdominal:     General: Bowel sounds are normal.     Palpations: Abdomen is soft.  Genitourinary:    Comments: Deferred Musculoskeletal:        General: No deformity or signs of injury.     Right lower leg: No edema.     Left lower leg: No edema.  Lymphadenopathy:     Upper Body:     Right upper body: No supraclavicular, axillary or pectoral adenopathy.     Left upper body: No supraclavicular, axillary or pectoral adenopathy.  Skin:    General: Skin is warm and dry.  Neurological:     General: No focal deficit present.     Mental Status: She is alert and oriented to person, place, and time.  Psychiatric:        Mood and Affect: Mood normal.        Behavior: Behavior normal.     Data Reviewed Imaging: CLINICAL DATA:  Screening.  EXAM: DIGITAL SCREENING BILATERAL MAMMOGRAM WITH TOMO AND CAD  COMPARISON:  Previous exam(s).  ACR Breast Density Category c: The breast tissue is heterogeneously dense, which may obscure small masses.  FINDINGS: In the left breast, calcifications warrant further evaluation. In the right breast, no  findings suspicious for malignancy. Images were processed with CAD.  IMPRESSION: Further evaluation is suggested for calcifications in the left breast.  RECOMMENDATION: Diagnostic mammogram of the left breast. (Code:[id number]-63M)  The patient will be contacted regarding the findings, and additional imaging will be scheduled.  BI-RADS CATEGORY  0: Incomplete. Need additional imaging evaluation and/or prior mammograms for comparison.  CLINICAL DATA:  Screening recall for left breast calcifications.  EXAM: DIGITAL DIAGNOSTIC LEFT MAMMOGRAM WITH CAD  COMPARISON:  Previous exam(s).  ACR Breast Density Category d: The breast tissue is extremely dense, which lowers the sensitivity of mammography.  FINDINGS: In the lateral left breast, middle depth there is a 9 mm group of new amorphous calcifications.  Mammographic images were processed with CAD.  IMPRESSION: There is a new 9 mm group of calcifications in the lateral left breast.  RECOMMENDATION: Stereotactic biopsy is recommended.  I have discussed the findings and recommendations with the patient. If applicable, a reminder letter will be sent to the patient regarding the next appointment.  BI-RADS CATEGORY  4: Suspicious.  Pathology report:   Specimen Submitted:  A. Breast, left   Clinical History: Faint amorphous calcifications, 9 mm group, in lateral  left breast (lower outer quadrant). Favor benign, likely [place]       DIAGNOSIS:  A. BREAST WITH CALCIFICATIONS, LEFT LOWER OUTER QUADRANT; STEREOTACTIC  BIOPSY:  - BENIGN BREAST TISSUE WITH CYSTIC PAPILLARY APOCRINE METAPLASIA.  - NEGATIVE FOR ATYPIA AND MALIGNANCY.  - MULTIPLE DEEPER SECTIONS WERE EXAMINED.  - SEE COMMENT.   I reviewed the  above imaging as well as the pathology.  I concur with the findings.  Assessment This is a 39 year old woman who underwent screening mammography.  She was called back for calcifications in the left breast and  subsequently underwent a biopsy.  The biopsy was benign.  She saw her primary care provider yesterday with concerns for a new lump in her left breast.  Upon further examination and questioning, I believed that the area of concern is simply scar from her biopsy, as it seems to be completely contained to the skin and does not involve the underlying parenchyma.  Externally, it also has the appearance of a typical scar from the procedure.  I do not think this represents a new breast mass.  Plan Tammy Garza was reassured by my assessment.  She should continue to perform breast self-examinations and have her scheduled annual mammogram next year.  There is no need for surgical intervention at this time.  I will see her on an as-needed basis.    Fredirick Maudlin 11/13/2019, 9:11 AM

## 2019-12-04 ENCOUNTER — Other Ambulatory Visit: Payer: Self-pay | Admitting: Family Medicine

## 2019-12-04 DIAGNOSIS — G4709 Other insomnia: Secondary | ICD-10-CM

## 2019-12-04 NOTE — Telephone Encounter (Signed)
Request from pharmacy for changes to original Rx- change in number and RF, diagnosis code requested

## 2019-12-11 ENCOUNTER — Telehealth: Payer: Self-pay

## 2019-12-11 NOTE — Telephone Encounter (Signed)
Pt left msg on triage line saying she has yeast infection sx (vag discharge, itching, and irritation). Advised monistat use and follow up as needed. No openings available for today or tomorrow.

## 2019-12-22 ENCOUNTER — Encounter: Payer: Self-pay | Admitting: Family Medicine

## 2019-12-22 ENCOUNTER — Other Ambulatory Visit (HOSPITAL_COMMUNITY)
Admission: RE | Admit: 2019-12-22 | Discharge: 2019-12-22 | Disposition: A | Discharge status: Home / Self Care | Payer: 59 | Source: Ambulatory Visit | Attending: Family Medicine | Admitting: Family Medicine

## 2019-12-22 ENCOUNTER — Ambulatory Visit (INDEPENDENT_AMBULATORY_CARE_PROVIDER_SITE_OTHER): Payer: 59 | Admitting: Family Medicine

## 2019-12-22 ENCOUNTER — Other Ambulatory Visit: Payer: Self-pay

## 2019-12-22 VITALS — BP 106/70 | HR 92 | Temp 99.5°F | Resp 14 | Ht 66.0 in | Wt 175.6 lb

## 2019-12-22 DIAGNOSIS — N760 Acute vaginitis: Secondary | ICD-10-CM

## 2019-12-22 DIAGNOSIS — F439 Reaction to severe stress, unspecified: Secondary | ICD-10-CM | POA: Diagnosis not present

## 2019-12-22 MED ORDER — FLUCONAZOLE 150 MG PO TABS: 150.0000 mg | ORAL_TABLET | ORAL | 0 refills | Status: DC

## 2019-12-22 NOTE — Progress Notes (Signed)
Name: Tammy Garza   MRN: [id number]    DOB: [birth year]   Date:12/22/2019       Progress Note  Subjective  Chief Complaint  Chief Complaint  Patient presents with  . Vaginitis    HPI  Acute vaginitis: she was at the beach in [place] three weeks ago - when daughter fainted and started to have hallucinations. She was taken to [facility] and admitted with brain bleeding. She has been having symptoms since she was at the beach, she states some itching and burning inside he vagina. No vaginal bleeding, no new sexual partners.   Stress: daughter is 40 yo and diagnosed with brain tumor - glioblastoma, she is at home now, may need to take leave of absence without pay.   Patient Active Problem List   Diagnosis Date Noted  . Abnormal mammogram 10/15/2019  . History of hemorrhoidectomy   . Hypocalcemia 10/13/2018  . Protein C deficiency ([place]) 12/10/2016  . Monoclonal gammopathy of unknown significance ([facility]) 11/30/2016  . History of DVT (deep vein thrombosis) 11/02/2016  . Elevated blood protein 09/26/2016  . Chronic deep vein thrombosis (DVT) of distal vein of left lower extremity ([place]) 09/17/2016  . Vitamin D deficiency 07/26/2015  . Thyroid cyst 07/26/2015  . History of Helicobacter pylori infection 04/22/2015  . Migraine with aura and without status migrainosus 04/22/2015  . Shift work sleep disorder 04/22/2015    Past Surgical History:  Procedure Laterality Date  . ABDOMINAL HYSTERECTOMY    . ANTERIOR CRUCIATE LIGAMENT REPAIR Left   . BREAST BIOPSY Left 10/07/2019   left breast calcs x clip  . CHOLECYSTECTOMY    . CYSTOSCOPY  12/19/2016   Procedure: CYSTOSCOPY;  Surgeon: Gae Dry, MD;  Location: [facility] ORS;  Service: Gynecology;;  . ESOPHAGOGASTRODUODENOSCOPY (EGD) WITH PROPOFOL N/A 09/25/2017   Procedure: ESOPHAGOGASTRODUODENOSCOPY (EGD) WITH PROPOFOL;  Surgeon: Jonathon Bellows, MD;  Location: [facility] ENDOSCOPY;  Service: Gastroenterology;  Laterality: N/A;  . LAPAROSCOPIC HYSTERECTOMY  Bilateral 12/19/2016   Procedure: HYSTERECTOMY TOTAL LAPAROSCOPIC BILATERAL SALPINGECTOMY;  Surgeon: Gae Dry, MD;  Location: [facility] ORS;  Service: Gynecology;  Laterality: Bilateral;  . PLANTAR FASCIA SURGERY  06/2019  . RECTAL EXAM UNDER ANESTHESIA N/A 02/26/2019   Procedure: RECTAL EXAM UNDER ANESTHESIA, EXCISION OF ANAL SKIN TAG;  Surgeon: Fredirick Maudlin, MD;  Location: [facility] ORS;  Service: General;  Laterality: N/A;  . TUBAL LIGATION      Family History  Problem Relation Age of Onset  . HIV Sister   . Cancer Sister 84       cervical cancer  . Asthma Daughter   . Protein C deficiency Daughter   . Hypertension Maternal Grandmother   . Hypertension Mother   . Obesity Mother   . Cancer Father 22       lung cancer  . Deep vein thrombosis Father   . Protein C deficiency Father   . COPD Father   . Congestive Heart Failure Father   . Diabetes Brother   . COPD Paternal Grandmother   . Deep vein thrombosis Paternal Grandmother   . Protein C deficiency Daughter   . Breast cancer Neg Hx     Social History   Tobacco Use  . Smoking status: Former Smoker    Packs/day: 0.25    Years: 5.00    Pack years: 1.25    Types: Cigarettes    Start date: 05/15/2002    Quit date: 05/16/2007    Years since quitting: 12.6  . Smokeless tobacco: Never  Used  Substance Use Topics  . Alcohol use: Yes    Alcohol/week: 1.0 standard drink    Types: 1 Glasses of wine per week    Comment: occ wine none last 24 hrs     Current Outpatient Medications:  .  baclofen (LIORESAL) 10 MG tablet, Take 10 mg by mouth 2 (two) times daily as needed (migraines). , Disp: , Rfl:  .  chlorproMAZINE (THORAZINE) 25 MG tablet, Take 25 mg by mouth every 2 (two) hours as needed (migraines). , Disp: , Rfl:  .  clindamycin (CLEOCIN T) 1 % lotion, Apply 1 g topically daily. On face , Disp: , Rfl:  .  ELIQUIS 5 MG TABS tablet, TAKE 1 TABLET BY MOUTH TWICE A DAY, Disp: 60 tablet, Rfl: 1 .  gabapentin (NEURONTIN) 600 MG  tablet, Take 600 mg by mouth 2 (two) times daily. , Disp: , Rfl: 5 .  glycopyrrolate (ROBINUL) 1 MG tablet, Take 3 mg by mouth daily. For hyperhidrosis , Disp: , Rfl: 2 .  Liraglutide -Weight Management (SAXENDA) 18 MG/3ML SOPN, Inject 0.5 mLs (3 mg total) into the skin daily., Disp: 9 mL, Rfl: 2 .  methocarbamol (ROBAXIN) 750 MG tablet, Take 750 mg by mouth at bedtime., Disp: , Rfl:  .  PARoxetine (PAXIL-CR) 12.5 MG 24 hr tablet, TAKE 1 TABLET BY MOUTH EVERY DAY, Disp: 90 tablet, Rfl: 0 .  traZODone (DESYREL) 50 MG tablet, TAKE 0.5-1 TABLETS (25-50 MG TOTAL) BY MOUTH AT BEDTIME AS NEEDED FOR SLEEP., Disp: 90 tablet, Rfl: 0 .  tretinoin (RETIN-A) 0.025 % cream, Apply 1 g topically at bedtime., Disp: , Rfl:  .  Vitamin D, Ergocalciferol, (DRISDOL) 1.25 MG (50000 UNIT) CAPS capsule, Take 1 capsule (50,000 Units total) by mouth every 7 (seven) days., Disp: 12 capsule, Rfl: 1 .  oxyCODONE-acetaminophen (PERCOCET) 10-325 MG tablet, Take 1 tablet by mouth every 4 (four) hours as needed for pain. (Patient not taking: Reported on 12/22/2019), Disp: 30 tablet, Rfl: 0  Allergies  Allergen Reactions  . Topamax [Topiramate] Other (See Comments)    Confusion , slurred speech   . Effexor [Venlafaxine]     Gas and bloating     I personally reviewed active problem list, medication list, allergies, family history, social history with the patient/caregiver today.   ROS  Ten systems reviewed and is negative except as mentioned in HPI   Objective  Vitals:   12/22/19 1518  BP: 106/70  Pulse: 92  Resp: 14  Temp: 99.5 F (37.5 C)  TempSrc: Oral  SpO2: 100%  Weight: 175 lb 9.6 oz (79.7 kg)  Height: 5\' 6"  (1.676 m)    Body mass index is 28.34 kg/m.  Physical Exam  Constitutional: Patient appears well-developed and well-nourished. Overweight. No distress.  HEENT: head atraumatic, normocephalic, pupils equal and reactive to light,neck supple Cardiovascular: Normal rate, regular rhythm and normal  heart sounds.  No murmur heard. No BLE edema. Pulmonary/Chest: Effort normal and breath sounds normal. No respiratory distress. Abdominal: Soft.  There is no tenderness. Psychiatric: Patient has a normal mood and affect. behavior is normal. Judgment and thought content normal.  Recent Results (from the past 2160 hour(s))  Surgical pathology     Status: None   Collection Time: 10/07/19 12:04 PM  Result Value Ref Range   SURGICAL PATHOLOGY      SURGICAL PATHOLOGY CASE: [id number] PATIENT: [facility] Pathology Report     Specimen Submitted: A. Breast, left  Clinical History: Faint amorphous calcifications, 9  mm group, in lateral left breast (lower outer quadrant).  Favor benign, likely [place]      DIAGNOSIS: A.  BREAST WITH CALCIFICATIONS, LEFT LOWER OUTER QUADRANT; STEREOTACTIC BIOPSY: - BENIGN BREAST TISSUE WITH CYSTIC PAPILLARY APOCRINE METAPLASIA. - NEGATIVE FOR ATYPIA AND MALIGNANCY. - MULTIPLE DEEPER SECTIONS WERE EXAMINED. - SEE COMMENT.  Comment: Slides from the tissue submitted in portions C, D, and E of the Brevera device demonstrate breast tissue with cystic areas lined by benign mammary epithelium with apocrine metaplasia that is focally arranged into small papillary structures. Multiple deeper sections were examined and the tissue was polarized in an attempt to identified calcifications. While not identified in this case, calcifications are often associated with areas o f apocrine metaplasia. The absence of calcifications in this case may reflect calcifications not surviving tissue processing. These histologic findings are benign. Correlation with radiographic impression is required.  GROSS DESCRIPTION: A. Labeled: Left breast amorphous calcs lower outer quadrant Received: in a formalin-filled Brevera collection device Specimen radiograph image(s) available for review, portions C-E marked on the device Time/Date in fixative: 11:45 AM on  10/07/2019 Cold ischemic time: Less than 5 minutes Total fixation time: 6 1/2 hours Core pieces: Multiple Measurement: 1.5 x 1 x 0.7 cm Description / comments: Aggregate of pink-yellow cylindrically shaped tissue fragments in the A through E portions of the device Inked: Green Entirely submitted in cassette(s): 1-portion C 2-portion D 3-portion E 4-portion A-B  Final Diagnosis performed by Quay Burow, MD.   Electronically signed 10/09/2019 10:03:07AM The electronic signature indicates that the  named Attending Pathologist has evaluated the specimen Technical component performed at [facility], [address] [place], [place] [address] Lab: [phone] Dir: Rush Farmer, MD, MMM  Professional component performed at [facility], [facility], [place], [place], [place] [address] Lab: [phone] Dir: Dellia Nims. Rubinas, MD   CBC with Differential/Platelet     Status: None   Collection Time: 10/15/19  8:50 AM  Result Value Ref Range   WBC 6.8 4.0 - 10.5 K/uL   RBC 4.13 3.87 - 5.11 MIL/uL   Hemoglobin 12.3 12.0 - 15.0 g/dL   HCT 38.8 36 - 46 %   MCV 93.9 80.0 - 100.0 fL   MCH 29.8 26.0 - 34.0 pg   MCHC 31.7 30.0 - 36.0 g/dL   RDW 12.9 11.5 - 15.5 %   Platelets 267 150 - 400 K/uL   nRBC 0.0 0.0 - 0.2 %   Neutrophils Relative % 46 %   Neutro Abs 3.2 1.7 - 7.7 K/uL   Lymphocytes Relative 41 %   Lymphs Abs 2.8 0.7 - 4.0 K/uL   Monocytes Relative 8 %   Monocytes Absolute 0.5 0 - 1 K/uL   Eosinophils Relative 4 %   Eosinophils Absolute 0.2 0 - 0 K/uL   Basophils Relative 1 %   Basophils Absolute 0.0 0 - 0 K/uL   Immature Granulocytes 0 %   Abs Immature Granulocytes 0.02 0.00 - 0.07 K/uL    Comment: Performed at [facility], [address], [place], [place] [address]  Comprehensive metabolic panel     Status: Abnormal   Collection Time: 10/15/19  8:50 AM  Result Value Ref Range   Sodium 137 135 - 145 mmol/L   Potassium 3.9 3.5 - 5.1 mmol/L    Chloride 104 98 - 111 mmol/L   CO2 25 22 - 32 mmol/L   Glucose, Bld 120 (H) 70 - 99 mg/dL    Comment: Glucose  reference range applies only to samples taken after fasting for at least 8 hours.   BUN 9 6 - 20 mg/dL   Creatinine, Ser 0.86 0.44 - 1.00 mg/dL   Calcium 8.6 (L) 8.9 - 10.3 mg/dL   Total Protein 7.3 6.5 - 8.1 g/dL   Albumin 4.1 3.5 - 5.0 g/dL   AST 16 15 - 41 U/L   ALT 14 0 - 44 U/L   Alkaline Phosphatase 46 38 - 126 U/L   Total Bilirubin 0.6 0.3 - 1.2 mg/dL   GFR calc non Af Amer >60 >60 mL/min   GFR calc Af Amer >60 >60 mL/min   Anion gap 8 5 - 15    Comment: Performed at [facility], [address] [place], [place] [address]      PHQ2/9: Depression screen [facility] 2/9 12/22/2019 11/12/2019 09/10/2019 08/13/2019 07/02/2019  Decreased Interest 1 0 0 0 0  Down, Depressed, Hopeless 0 0 0 0 0  PHQ - 2 Score 1 0 0 0 0  Altered sleeping 3 0 0 0 0  Tired, decreased energy 3 0 0 0 0  Change in appetite 1 0 0 0 0  Feeling bad or failure about yourself  1 0 0 0 0  Trouble concentrating 1 0 0 0 0  Moving slowly or fidgety/restless 1 0 0 0 0  Suicidal thoughts 0 0 0 0 0  PHQ-9 Score 11 0 0 0 0  Difficult doing work/chores Somewhat difficult - - - Not difficult at all  Some recent data might be hidden    phq 9 is positive   Fall Risk: Fall Risk  12/22/2019 11/13/2019 11/12/2019 09/10/2019 08/13/2019  Falls in the past year? 0 0 0 0 0  Number falls in past yr: 0 0 0 0 0  Injury with Fall? 0 0 0 0 0  Comment - - - - -     Assessment & Plan   1. Acute vaginitis  - fluconazole (DIFLUCAN) 150 MG tablet; Take 1 tablet (150 mg total) by mouth every other day.  Dispense: 3 tablet; Refill: 0 - Cervicovaginal ancillary only  2. Stress  Oldest daughter tried to commit suicidal April 2021 and the youngest is 40 yo and diagnosed with brain tumor 11/2019

## 2019-12-24 LAB — CERVICOVAGINAL ANCILLARY ONLY
Bacterial Vaginitis (gardnerella): POSITIVE — AB
Candida Glabrata: NEGATIVE
Candida Vaginitis: POSITIVE — AB
Chlamydia: NEGATIVE
Comment: NEGATIVE
Comment: NEGATIVE
Comment: NEGATIVE
Comment: NEGATIVE
Comment: NEGATIVE
Comment: NORMAL
Neisseria Gonorrhea: NEGATIVE
Trichomonas: NEGATIVE

## 2019-12-26 ENCOUNTER — Encounter: Payer: Self-pay | Admitting: Family Medicine

## 2019-12-29 ENCOUNTER — Encounter: Payer: Self-pay | Admitting: Hematology and Oncology

## 2019-12-29 ENCOUNTER — Other Ambulatory Visit: Payer: Self-pay | Admitting: Family Medicine

## 2019-12-29 MED ORDER — METRONIDAZOLE 0.75 % VA GEL: 1.0000 | Freq: Two times a day (BID) | VAGINAL | 0 refills | Status: DC

## 2020-01-10 ENCOUNTER — Other Ambulatory Visit: Payer: Self-pay | Admitting: Hematology and Oncology

## 2020-01-14 DIAGNOSIS — Z1371 Encounter for nonprocreative screening for genetic disease carrier status: Secondary | ICD-10-CM

## 2020-01-14 DIAGNOSIS — Z9189 Other specified personal risk factors, not elsewhere classified: Secondary | ICD-10-CM

## 2020-01-14 HISTORY — DX: Other specified personal risk factors, not elsewhere classified: Z91.89

## 2020-01-14 HISTORY — DX: Encounter for nonprocreative screening for genetic disease carrier status: Z13.71

## 2020-01-15 ENCOUNTER — Other Ambulatory Visit: Payer: Self-pay

## 2020-01-15 ENCOUNTER — Inpatient Hospital Stay: Payer: 59 | Attending: Hematology and Oncology

## 2020-01-15 DIAGNOSIS — D6859 Other primary thrombophilia: Secondary | ICD-10-CM

## 2020-01-15 DIAGNOSIS — R928 Other abnormal and inconclusive findings on diagnostic imaging of breast: Secondary | ICD-10-CM

## 2020-01-15 DIAGNOSIS — I825Z2 Chronic embolism and thrombosis of unspecified deep veins of left distal lower extremity: Secondary | ICD-10-CM

## 2020-01-15 DIAGNOSIS — D472 Monoclonal gammopathy: Secondary | ICD-10-CM | POA: Diagnosis present

## 2020-01-16 LAB — PROTEIN ELECTROPHORESIS, SERUM
A/G Ratio: 1.2 (ref 0.7–1.7)
Albumin ELP: 3.6 g/dL (ref 2.9–4.4)
Alpha-1-Globulin: 0.2 g/dL (ref 0.0–0.4)
Alpha-2-Globulin: 0.8 g/dL (ref 0.4–1.0)
Beta Globulin: 0.9 g/dL (ref 0.7–1.3)
Gamma Globulin: 1.3 g/dL (ref 0.4–1.8)
Globulin, Total: 3.1 g/dL (ref 2.2–3.9)
Total Protein ELP: 6.7 g/dL (ref 6.0–8.5)

## 2020-02-04 ENCOUNTER — Other Ambulatory Visit: Payer: Self-pay | Admitting: Family Medicine

## 2020-02-04 NOTE — Telephone Encounter (Signed)
Requested Prescriptions  Pending Prescriptions Disp Refills  . PARoxetine (PAXIL-CR) 12.5 MG 24 hr tablet [Pharmacy Med Name: PAROXETINE ER 12.5 MG TABLET] 90 tablet 1    Sig: TAKE 1 TABLET BY MOUTH EVERY DAY     Psychiatry:  Antidepressants - SSRI Passed - 02/04/2020  1:16 AM      Passed - Valid encounter within last 6 months    Recent Outpatient Visits          1 month ago Acute vaginitis   [facility] [name], MD   2 months ago Acute leg pain, right   [facility] [name], MD   4 months ago Well adult exam   [facility] [name], MD   5 months ago Hyperglycemia   [facility] [name], MD   7 months ago Hot flashes   [facility] [name], MD      Future Appointments            In 2 weeks [name], MD [facility] [place]   In 7 months [name], MD [facility], [facility]

## 2020-02-06 ENCOUNTER — Other Ambulatory Visit: Payer: Self-pay

## 2020-02-06 ENCOUNTER — Other Ambulatory Visit (HOSPITAL_COMMUNITY)
Admission: RE | Admit: 2020-02-06 | Discharge: 2020-02-06 | Disposition: A | Discharge status: Home / Self Care | Payer: 59 | Source: Ambulatory Visit | Attending: Obstetrics & Gynecology | Admitting: Obstetrics & Gynecology

## 2020-02-06 ENCOUNTER — Encounter: Payer: Self-pay | Admitting: Obstetrics & Gynecology

## 2020-02-06 ENCOUNTER — Ambulatory Visit (INDEPENDENT_AMBULATORY_CARE_PROVIDER_SITE_OTHER): Payer: 59 | Admitting: Obstetrics & Gynecology

## 2020-02-06 VITALS — BP 100/60 | Ht 66.0 in | Wt 175.0 lb

## 2020-02-06 DIAGNOSIS — N898 Other specified noninflammatory disorders of vagina: Secondary | ICD-10-CM | POA: Diagnosis not present

## 2020-02-06 DIAGNOSIS — Z01419 Encounter for gynecological examination (general) (routine) without abnormal findings: Secondary | ICD-10-CM | POA: Diagnosis not present

## 2020-02-06 DIAGNOSIS — Z8041 Family history of malignant neoplasm of ovary: Secondary | ICD-10-CM | POA: Diagnosis not present

## 2020-02-06 NOTE — Patient Instructions (Signed)
Multigene Panel Testing for Cancer  What is cancer? Normal cells in the body grow, divide, and are replaced on a routine basis. Sometimes, cells divide abnormally and begin to grow out of control. These cells may form growths or tumors. Tumors can be benign (not cancer) or malignant (cancer). Benign tumors do not spread to other body tissues. Cancer tumors can invade and destroy nearby healthy tissues, bones, and organs. Cancer cells also can spread to other parts of the body and form new cancerous areas.  What causes cancer? Cancer is caused by many different factors. A few types of cancer are caused by changes in genes that can be passed from parent to child. Changes in genes are called mutations. Certain gene mutations are associated with family cancer syndromes. What are family cancer syndromes?  Family cancer syndromes are genetic conditions that increase the risk of certain types of cancer. They also are called hereditary or inherited cancer syndromes. Common family cancer syndromes include hereditary breast and ovarian cancer (HBOC) syndrome, Lynch syndrome, Li-Fraumeni syndrome, Cowden syndrome, and Peutz-Jeghers syndrome. What is genetic testing for cancer? Genetic testing for cancer looks for mutations in certain genes that are known to be linked to cancer. The results can help determine your risk of developing a disease like cancer or passing on a genetic disorder. What is multigene panel testing? Multigene panel testing is a type of genetic testing that looks for mutations in several genes at once. This is different from single-gene testing, which looks for a mutation in a specific gene. Single-gene testing is often used when there is already a known gene mutation in a family. For example, testing for BRCA mutations only looks for changes in BRCA1 and BRCA2 genes. Who should have genetic testing? You may consider genetic testing if your personal or family history shows that you have an  increased risk of cancer. Your obstetrician-gynecologist (ob-gyn) or other health care professional may ask you these and other questions: . Have you or any family members been diagnosed with cancer?  . If yes, which family members were diagnosed, with what types of cancer, and at what ages?  . Were you or any of your family members born with birth defects?  . Are you of Eastern or Central European Jewish ancestry? Depending on your answers, your ob-gyn or other health care professional may suggest that you talk about genetic testing with a genetic counselor or a physician who is an expert in genetics. You can choose to have genetic testing, or you can choose not to. Before you decide, you should have genetic counseling. What is genetic counseling? In genetic counseling, you will talk with a genetic counselor or physician expert about the following: . Your risk of getting a hereditary type of cancer  . Who in your family could potentially get tested  . How testing is done  . What the test results may mean  . What you may do depending on the results How is genetic testing done? Genetic testing typically is done from a blood sample or saliva sample. When is multigene panel testing recommended? Multigene panel testing may be useful if you . are at risk of a family cancer syndrome that has more than one gene associated with it  . have a personal or family history of cancer and single-gene testing has not found a mutation, or the result is uncertain What are the benefits of multigene panel testing? Multigene panel testing looks at multiple genes with one test. If a gene   mutation is found, multigene panel testing may . give you a better understanding of your cancer risk than single-gene testing  . help your health care team decide what cancer screenings you might need beyond routine screenings  . help you think about what you can do to prevent cancer What are the risks of multigene panel  testing? The risks of multigene panel testing may include the following: . Results can be complicated to interpret.  . Testing may find gene mutations that show a moderate or uncertain risk of cancer.  . It may be hard to know what you should do with your test results. You should talk with a genetic counselor or physician expert before and after genetic testing to learn what the results mean. If I have a gene mutation, should I tell my family? Having a gene mutation means you can pass the mutation to your children. Your siblings also may have the gene mutation. Although you do not have to tell your family members, sharing the information could be life-saving for them. With this information, your family members can decide whether to be tested and get cancer screenings at an early age. How can I prevent cancer if I test positive for a gene mutation? If you test positive for a gene mutation, you can discuss cancer screening and prevention options with your ob-gyn, genetic counselor, or other health care professional. It may be helpful to have earlier or more frequent cancer screening tests, which can find cancer at an early and more curable stage. Risk reduction steps like medication, surgery, and lifestyle changes also may be recommended. I'm concerned about discrimination based on genetic testing results. What should I know? Many people are concerned about possible employment discrimination or denial of insurance coverage based on genetic testing results. The Genetic Information Nondiscrimination Act of 2008 (GINA) makes it illegal for health insurers to require genetic testing results or use results to make decisions about coverage, rates, or preexisting conditions. GINA also makes it illegal for employers to discriminate against employees or applicants because of genetic information. GINA does not apply to life insurance, long-term care insurance, or disability insurance. What should I know about  direct-to-consumer genetic tests? Direct-to-consumer (or at-home) genetic tests are sold over the internet. You do not need a doctor's order to get one. The [employer] discourages use of direct-to-consumer genetic tests because the results may be misleading. For example, one commercial test for BRCA mutations only looks for three mutations, even though there are more than 500 BRCA mutations linked to cancer. The test results could cause unnecessary fear, or a false sense that you are not at risk. You should see a health care professional if you want a genetic test.  Glossary BRCA1 and BRCA2: Genes that keep cells from growing too rapidly. Changes in these genes have been linked to an increased risk of breast cancer and ovarian cancer.  Cowden Syndrome: A genetic condition that increases a person's risk of cancer of the breast, thyroid, uterus, colon, kidney, and skin. Genes: Segments of DNA that contain instructions for the development of a person's physical traits and control of the processes in the body. The gene is the basic unit of heredity and can be passed from parent to child. Genetic Counselor: A health care professional with special training in genetics who can provide expert advice about genetic disorders and prenatal testing. Hereditary Breast and Ovarian Cancer (HBOC) Syndrome: A genetic condition that increases a person's risk of cancer  of the breast, ovary, prostate, pancreas, and skin (melanoma). Li-Fraumeni Syndrome: A genetic condition that increases a person's risk of cancer of the breast, bones, soft tissue, brain, and outer layer of the adrenal glands. Lynch Syndrome: A genetic condition that increases a person's risk of cancer of the colon, rectum, ovary, uterus, pancreas, and bile duct. Multigene Panel Testing: A type of genetic test that can look for mutations in multiple genes at once. Mutations: Changes in genes that can be passed from  parent to child. Obstetrician-Gynecologist (Ob-Gyn): A doctor with special training and education in women's health. Peutz-Jeghers Syndrome: A genetic condition that increases a person's risk of cancer of the stomach, intestines, pancreas, cervix, ovary, and breast.   Menopause Menopause is the normal time of life when menstrual periods stop completely. It is usually confirmed by 12 months without a menstrual period. The transition to menopause (perimenopause) most often happens between the ages of 33 and 20. During perimenopause, hormone levels change in your body, which can cause symptoms and affect your health. Menopause may increase your risk for:  Loss of bone (osteoporosis), which causes bone breaks (fractures).  Depression.  Hardening and narrowing of the arteries (atherosclerosis), which can cause heart attacks and strokes. What are the causes? This condition is usually caused by a natural change in hormone levels that happens as you get older. The condition may also be caused by surgery to remove both ovaries (bilateral oophorectomy). What increases the risk? This condition is more likely to start at an earlier age if you have certain medical conditions or treatments, including:  A tumor of the pituitary gland in the brain.  A disease that affects the ovaries and hormone production.  Radiation treatment for cancer.  Certain cancer treatments, such as chemotherapy or hormone (anti-estrogen) therapy.  Heavy smoking and excessive alcohol use.  Family history of early menopause. This condition is also more likely to develop earlier in women who are very thin. What are the signs or symptoms? Symptoms of this condition include:  Hot flashes.  Irregular menstrual periods.  Night sweats.  Changes in feelings about sex. This could be a decrease in sex drive or an increased comfort around your sexuality.  Vaginal dryness and thinning of the vaginal walls. This may cause  painful intercourse.  Dryness of the skin and development of wrinkles.  Headaches.  Problems sleeping (insomnia).  Mood swings or irritability.  Memory problems.  Weight gain.  Hair growth on the face and chest.  Bladder infections or problems with urinating. How is this diagnosed? This condition is diagnosed based on your medical history, a physical exam, your age, your menstrual history, and your symptoms. Hormone tests may also be done. How is this treated? In some cases, no treatment is needed. You and your health care provider should make a decision together about whether treatment is necessary. Treatment will be based on your individual condition and preferences. Treatment for this condition focuses on managing symptoms. Treatment may include:  Menopausal hormone therapy (MHT). NOT A CANDIDATE  Medicines to treat specific symptoms or complications.  Acupuncture.  Vitamin or herbal supplements.  BLACK COHOSH.  Before starting treatment, make sure to let your health care provider know if you have a personal or family history of:  Heart disease.  Breast cancer.  Blood clots.  Diabetes.  Osteoporosis. Follow these instructions at home: Lifestyle  Do not use any products that contain nicotine or tobacco, such as cigarettes and e-cigarettes. If you need help quitting, ask  your health care provider.  Get at least 30 minutes of physical activity on 5 or more days each week.  Avoid alcoholic and caffeinated beverages, as well as spicy foods. This may help prevent hot flashes.  Get 7-8 hours of sleep each night.  If you have hot flashes, try: ? Dressing in layers. ? Avoiding things that may trigger hot flashes, such as spicy food, warm places, or stress. ? Taking slow, deep breaths when a hot flash starts. ? Keeping a fan in your home and office.  Find ways to manage stress, such as deep breathing, meditation, or journaling.  Consider going to group therapy with  other women who are having menopause symptoms. Ask your health care provider about recommended group therapy meetings. Eating and drinking  Eat a healthy, balanced diet that contains whole grains, lean protein, low-fat dairy, and plenty of fruits and vegetables.  Your health care provider may recommend adding more soy to your diet. Foods that contain soy include tofu, tempeh, and soy milk.  Eat plenty of foods that contain calcium and vitamin D for bone health. Items that are rich in calcium include low-fat milk, yogurt, beans, almonds, sardines, broccoli, and kale. Medicines  Take over-the-counter and prescription medicines only as told by your health care provider.  Talk with your health care provider before starting any herbal supplements. If prescribed, take vitamins and supplements as told by your health care provider. These may include: ? Calcium. Women age 31 and older should get 1,200 mg (milligrams) of calcium every day. ? Vitamin D. Women need 600-800 International Units of vitamin D each day. ? Vitamins B12 and B6. Aim for 50 micrograms of B12 and 1.5 mg of B6 each day. General instructions  Keep track of your menstrual periods, including: ? When they occur. ? How heavy they are and how long they last. ? How much time passes between periods.  Keep track of your symptoms, noting when they start, how often you have them, and how long they last.  Use vaginal lubricants or moisturizers to help with vaginal dryness and improve comfort during sex.  Keep all follow-up visits as told by your health care provider. This is important. This includes any group therapy or counseling. Contact a health care provider if:  You are still having menstrual periods after age 67.  You have pain during sex.  You have not had a period for 12 months and you develop vaginal bleeding. Get help right away if:  You have: ? Severe depression. ? Excessive vaginal bleeding. ? Pain when you  urinate. ? A fast or irregular heart beat (palpitations). ? Severe headaches. ? Abdomen (abdominal) pain or severe indigestion.  You fell and you think you have a broken bone.  You develop leg or chest pain.  You develop vision problems.  You feel a lump in your breast. Summary  Menopause is the normal time of life when menstrual periods stop completely. It is usually confirmed by 12 months without a menstrual period.  The transition to menopause (perimenopause) most often happens between the ages of 21 and 38.  Symptoms can be managed through medicines, lifestyle changes, and complementary therapies such as acupuncture.  Eat a balanced diet that is rich in nutrients to promote bone health and heart health and to manage symptoms during menopause. This information is not intended to replace advice given to you by your health care provider. Make sure you discuss any questions you have with your health care provider. Document  Revised: 04/13/2017 Document Reviewed: 06/03/2016 Elsevier Patient Education  [place].

## 2020-02-06 NOTE — Progress Notes (Signed)
HPI:      [name] is a 39 y.o. [id number] who LMP was Patient's last menstrual period was 01/04/2016., s/p TLH, she presents today for her annual examination. The patient has no complaints today.  She is getting over tx for BV and yeast infection, minor itching still reported.  Discussed stress in life related to recent daughter dx of gliocytoma, undergoing tx after surgeries.  Pt has h/o Prot C def and DVT, on lifelong Eliquis.   The patient is sexually active. Her last pap: was normal and last mammogram: approximate date 5/21 and was abnormal: she has had SCNB w good results. The patient does perform self breast exams.  There is notable family history of breast or ovarian cancer in her family - SISTER w OVARIAN CANCER age 40.   The patient has regular exercise: yes.  The patient denies current symptoms of depression.    GYN History: s/p hysterectomy Occas hot flash  PMHx: Past Medical History:  Diagnosis Date  . Anemia    h/o  . Cervical high risk human papillomavirus (HPV) DNA test positive   . Chronic gastritis   . Depression   . DVT (deep venous thrombosis) ([place]) 09/2016   left leg  . GERD (gastroesophageal reflux disease)   . H. pylori infection   . History of attempted suicide   . History of gastric polyp   . History of kidney stones    h/o  . Hyperhidrosis   . Migraines   . Protein C deficiency ([place])   . Rash   . Shift work sleep disorder    Past Surgical History:  Procedure Laterality Date  . ABDOMINAL HYSTERECTOMY    . ANTERIOR CRUCIATE LIGAMENT REPAIR Left   . BREAST BIOPSY Left 10/07/2019   left breast calcs x clip  . CHOLECYSTECTOMY    . CYSTOSCOPY  12/19/2016   Procedure: CYSTOSCOPY;  Surgeon: Gae Dry, MD;  Location: [facility] ORS;  Service: Gynecology;;  . ESOPHAGOGASTRODUODENOSCOPY (EGD) WITH PROPOFOL N/A 09/25/2017   Procedure: ESOPHAGOGASTRODUODENOSCOPY (EGD) WITH PROPOFOL;  Surgeon: Jonathon Bellows, MD;  Location: [facility] ENDOSCOPY;  Service:  Gastroenterology;  Laterality: N/A;  . LAPAROSCOPIC HYSTERECTOMY Bilateral 12/19/2016   Procedure: HYSTERECTOMY TOTAL LAPAROSCOPIC BILATERAL SALPINGECTOMY;  Surgeon: Gae Dry, MD;  Location: [facility] ORS;  Service: Gynecology;  Laterality: Bilateral;  . PLANTAR FASCIA SURGERY  06/2019  . RECTAL EXAM UNDER ANESTHESIA N/A 02/26/2019   Procedure: RECTAL EXAM UNDER ANESTHESIA, EXCISION OF ANAL SKIN TAG;  Surgeon: Fredirick Maudlin, MD;  Location: [facility] ORS;  Service: General;  Laterality: N/A;  . TUBAL LIGATION     Family History  Problem Relation Age of Onset  . HIV Sister   . Cancer Sister 27       cervical cancer  . Asthma Daughter   . Protein C deficiency Daughter   . Brain cancer Daughter   . Cancer Daughter   . Hypertension Maternal Grandmother   . Hypertension Mother   . Obesity Mother   . Cancer Father 72       lung cancer  . Deep vein thrombosis Father   . Protein C deficiency Father   . COPD Father   . Congestive Heart Failure Father   . Diabetes Brother   . COPD Paternal Grandmother   . Deep vein thrombosis Paternal Grandmother   . Protein C deficiency Daughter   . Breast cancer Neg Hx    Social History   Tobacco Use  . Smoking status: Former Smoker  Packs/day: 0.25    Years: 5.00    Pack years: 1.25    Types: Cigarettes    Start date: 05/15/2002    Quit date: 05/16/2007    Years since quitting: 12.7  . Smokeless tobacco: Never Used  Vaping Use  . Vaping Use: Never used  Substance Use Topics  . Alcohol use: Yes    Alcohol/week: 1.0 standard drink    Types: 1 Glasses of wine per week    Comment: occ wine none last 24 hrs  . Drug use: Yes    Types: Marijuana    Current Outpatient Medications:  .  ELIQUIS 5 MG TABS tablet, TAKE 1 TABLET BY MOUTH TWICE A DAY, Disp: 60 tablet, Rfl: 1 .  PARoxetine (PAXIL-CR) 12.5 MG 24 hr tablet, TAKE 1 TABLET BY MOUTH EVERY DAY, Disp: 90 tablet, Rfl: 1 Allergies: Topamax [topiramate] and Effexor [venlafaxine]  Review of  Systems  Constitutional: Negative for chills, fever and malaise/fatigue.  HENT: Negative for congestion, sinus pain and sore throat.   Eyes: Negative for blurred vision and pain.  Respiratory: Negative for cough and wheezing.   Cardiovascular: Negative for chest pain and leg swelling.  Gastrointestinal: Negative for abdominal pain, constipation, diarrhea, heartburn, nausea and vomiting.  Genitourinary: Negative for dysuria, frequency, hematuria and urgency.  Musculoskeletal: Negative for back pain, joint pain, myalgias and neck pain.  Skin: Negative for itching and rash.  Neurological: Negative for dizziness, tremors and weakness.  Endo/Heme/Allergies: Does not bruise/bleed easily.  Psychiatric/Behavioral: Negative for depression. The patient is nervous/anxious. The patient does not have insomnia.     Objective: BP 100/60   Ht 5\' 6"  (1.676 m)   Wt 175 lb (79.4 kg)   LMP 01/04/2016 Comment: partial hysterectomy, tubal   BMI 28.25 kg/m   Filed Weights   02/06/20 1331  Weight: 175 lb (79.4 kg)   Body mass index is 28.25 kg/m. Physical Exam Constitutional:      General: She is not in acute distress.    Appearance: She is well-developed.  Genitourinary:     Pelvic exam was performed with patient supine.     Vulva, urethra, bladder, vagina and rectum normal.     No lesions in the vagina.     No vaginal bleeding.     Cervix is absent.     Uterus is absent.     No right or left adnexal mass present.     Right adnexa not tender.     Left adnexa not tender.     Genitourinary Comments: Vaginal cuff well healed  HENT:     Head: Normocephalic and atraumatic. No laceration.     Right Ear: Hearing normal.     Left Ear: Hearing normal.     Mouth/Throat:     Pharynx: Uvula midline.  Eyes:     Pupils: Pupils are equal, round, and reactive to light.  Neck:     Thyroid: No thyromegaly.  Cardiovascular:     Rate and Rhythm: Normal rate and regular rhythm.     Heart sounds: No murmur  heard.  No friction rub. No gallop.   Pulmonary:     Effort: Pulmonary effort is normal. No respiratory distress.     Breath sounds: Normal breath sounds. No wheezing.  Chest:     Breasts:        Right: No mass, skin change or tenderness.        Left: No mass, skin change or tenderness.  Abdominal:  General: Bowel sounds are normal. There is no distension.     Palpations: Abdomen is soft.     Tenderness: There is no abdominal tenderness. There is no rebound.  Musculoskeletal:        General: Normal range of motion.     Cervical back: Normal range of motion and neck supple.  Neurological:     Mental Status: She is alert and oriented to person, place, and time.     Cranial Nerves: No cranial nerve deficit.  Skin:    General: Skin is warm and dry.  Psychiatric:        Judgment: Judgment normal.  Vitals reviewed.     Assessment:  ANNUAL EXAM 1. Family history of ovarian cancer   2. Vaginal discharge   3. Women's annual routine gynecological examination      Screening Plan:            1.  Cervical Screening-  Pap smear schedule reviewed with patient  2. Breast screening- Exam annually and mammogram>40 planned   3. Colonoscopy every 10 years, Hemoccult testing - after age 58  4. Labs managed by PCP  5. Counseling for contraception: no method needed   6. Family history of ovarian cancer - CA 125 - Genetic Screening - My Risk testing - US PELVIC COMPLETE WITH TRANSVAGINAL; Future - Counseled no perfect screening protocol for ovarian cancer, but [place] and CA125 periodically may pick up on early dz.  Sx's to monitor for also counseled to pt.   7. Vaginal discharge - follow up testing - Cervicovaginal ancillary only    F/U  Return in about 3 weeks (around 02/27/2020) for Follow up w GYN [place].  Barnett Applebaum, MD, [facility], [facility] 02/06/2020  2:10 PM

## 2020-02-07 LAB — CA 125: Cancer Antigen (CA) 125: 7.8 U/mL (ref 0.0–38.1)

## 2020-02-10 ENCOUNTER — Other Ambulatory Visit: Payer: Self-pay | Admitting: Obstetrics & Gynecology

## 2020-02-10 LAB — CERVICOVAGINAL ANCILLARY ONLY
Bacterial Vaginitis (gardnerella): POSITIVE — AB
Candida Glabrata: NEGATIVE
Candida Vaginitis: POSITIVE — AB
Comment: NEGATIVE
Comment: NEGATIVE
Comment: NEGATIVE

## 2020-02-10 MED ORDER — FLUCONAZOLE 150 MG PO TABS: 150.0000 mg | ORAL_TABLET | Freq: Once | ORAL | 1 refills | Status: AC

## 2020-02-10 MED ORDER — METRONIDAZOLE 0.75 % VA GEL: 1.0000 | Freq: Every day | VAGINAL | 0 refills | Status: AC

## 2020-02-16 NOTE — Progress Notes (Signed)
Name: Tammy Garza   MRN: [id number]    DOB: 01-17-1981   Date:02/18/2020       Progress Note  Subjective  Chief Complaint  Chief Complaint  Patient presents with  . Follow-up    3 month follow up    HPI  Baker's cyst : she had a rupture of the baker's cyst right leg this Summer , got better but has noticed increase in pain again, waiting to see Dr. Harlow Mares again, steroid injection helped in the past   MGUS: she is still seeing Dr. Mike Gip every 3 months , last protein level and CBC were normal.  Plantar Fascitis: on left side, seeing podiatrist, Dr. Posey Pronto. She stands and walks a lot at work, she has to wear steel toe shoes. She states symptoms started after she switch positions at work from an area that had wood floors to concrete floors ( back in Dec 2020 ). She had steroid injection, splinting at night, boot during the day, and has insoles made , she had plantar fascia release on Feb 22 nd, and went back work June 9 th, 2021. No longer wearing a brace, she is currently not working but has been able to walk without problems  Protein C deficiency and history of DVT: still under the care of Dr. Mike Gip, taking anticoagulants, and no side effects. Unchanged   Migraine headaches: she used to see Dr. Domingo Cocking, but because he missed glioblastoma on her youngest daughter, she would like to see someone else. She is taking gabapentin since 2018,episodes down toabout 3times per month. She has associated nausea, photophobia and phonophobia. Pain is described as temporal throbbing like, sometimes nuchal area. She has intermittent numbness on one side of her body, sometimes severe nausea    Thyroid cyst:Us was reassuring no need for biopsy or repeat study.She denies dysphagia, cannot feel any lumps on her neck  Acne: she is now seeing Dr. Evorn Gong, using topical medication and it is helping with symptoms, face has cleared   Anxiety: worse since January 2021, however much worse since  July 2021 when daughter was diagnosed with glioblastoma and had surgery and started on radiation therapy. She has good support system, had to take leave absence without pay, but has been compliant with therapy   Hyperthyrosis: improved with medication given by Dr. Evorn Gong. Unchanged   Obesity: started on Saxenda back in March 2021 weight was 192 lbs, dose is now up to 3 mg, weight has been stable at mid 170's, she is please with results, medications curbs her appetite, no side effects.   Abnormal mammogram : negative biopsy, reviewed results again today    Patient Active Problem List   Diagnosis Date Noted  . Abnormal mammogram 10/15/2019  . History of hemorrhoidectomy   . Hypocalcemia 10/13/2018  . Protein C deficiency ([place]) 12/10/2016  . Monoclonal gammopathy of unknown significance (MGUS) 11/30/2016  . History of DVT (deep vein thrombosis) 11/02/2016  . Elevated blood protein 09/26/2016  . Chronic deep vein thrombosis (DVT) of distal vein of left lower extremity ([place]) 09/17/2016  . Vitamin D deficiency 07/26/2015  . Thyroid cyst 07/26/2015  . History of Helicobacter pylori infection 04/22/2015  . Migraine with aura and without status migrainosus 04/22/2015  . Shift work sleep disorder 04/22/2015    Past Surgical History:  Procedure Laterality Date  . ABDOMINAL HYSTERECTOMY    . ANTERIOR CRUCIATE LIGAMENT REPAIR Left   . BREAST BIOPSY Left 10/07/2019   left breast calcs x clip  . CHOLECYSTECTOMY    .  CYSTOSCOPY  12/19/2016   Procedure: CYSTOSCOPY;  Surgeon: Gae Dry, MD;  Location: [facility] ORS;  Service: Gynecology;;  . ESOPHAGOGASTRODUODENOSCOPY (EGD) WITH PROPOFOL N/A 09/25/2017   Procedure: ESOPHAGOGASTRODUODENOSCOPY (EGD) WITH PROPOFOL;  Surgeon: Jonathon Bellows, MD;  Location: [facility] ENDOSCOPY;  Service: Gastroenterology;  Laterality: N/A;  . LAPAROSCOPIC HYSTERECTOMY Bilateral 12/19/2016   Procedure: HYSTERECTOMY TOTAL LAPAROSCOPIC BILATERAL SALPINGECTOMY;  Surgeon: Gae Dry, MD;  Location: [facility] ORS;  Service: Gynecology;  Laterality: Bilateral;  . PLANTAR FASCIA SURGERY  06/2019  . RECTAL EXAM UNDER ANESTHESIA N/A 02/26/2019   Procedure: RECTAL EXAM UNDER ANESTHESIA, EXCISION OF ANAL SKIN TAG;  Surgeon: Fredirick Maudlin, MD;  Location: [facility] ORS;  Service: General;  Laterality: N/A;  . TUBAL LIGATION      Family History  Problem Relation Age of Onset  . HIV Sister   . Ovarian cancer Sister 55  . Asthma Daughter   . Protein C deficiency Daughter   . Brain cancer Daughter   . Cancer Daughter   . Hypertension Maternal Grandmother   . Hypertension Mother   . Obesity Mother   . Cancer Father 9       lung cancer  . Deep vein thrombosis Father   . Protein C deficiency Father   . COPD Father   . Congestive Heart Failure Father   . Diabetes Brother   . COPD Paternal Grandmother   . Deep vein thrombosis Paternal Grandmother   . Protein C deficiency Daughter   . Breast cancer Neg Hx     Social History   Tobacco Use  . Smoking status: Former Smoker    Packs/day: 0.25    Years: 5.00    Pack years: 1.25    Types: Cigarettes    Start date: 05/15/2002    Quit date: 05/16/2007    Years since quitting: 12.7  . Smokeless tobacco: Never Used  Substance Use Topics  . Alcohol use: Yes    Alcohol/week: 1.0 standard drink    Types: 1 Glasses of wine per week    Comment: occ wine none last 24 hrs     Current Outpatient Medications:  .  BD PEN NEEDLE NANO 2ND GEN 32G X 4 MM MISC, , Disp: , Rfl:  .  Black Cohosh 540 MG CAPS, Take by mouth., Disp: , Rfl:  .  ELIQUIS 5 MG TABS tablet, TAKE 1 TABLET BY MOUTH TWICE A DAY, Disp: 60 tablet, Rfl: 1 .  PARoxetine (PAXIL-CR) 12.5 MG 24 hr tablet, TAKE 1 TABLET BY MOUTH EVERY DAY, Disp: 90 tablet, Rfl: 1 .  SAXENDA 18 MG/3ML SOPN, Inject 3 mg into the skin daily., Disp: , Rfl:  .  Vitamin D, Ergocalciferol, (DRISDOL) 1.25 MG (50000 UNIT) CAPS capsule, Take 50,000 Units by mouth every 7 (seven) days., Disp: ,  Rfl:   Allergies  Allergen Reactions  . Topamax [Topiramate] Other (See Comments)    Confusion , slurred speech   . Effexor [Venlafaxine]     Gas and bloating     I personally reviewed active problem list, medication list, allergies, family history, social history, health maintenance with the patient/caregiver today.   ROS  Constitutional: Negative for fever or weight change.  Respiratory: Negative for cough and shortness of breath.   Cardiovascular: Negative for chest pain or palpitations.  Gastrointestinal: Negative for abdominal pain, no bowel changes.  Musculoskeletal: Negative for gait problem or joint swelling.  Skin: Negative for rash.  Neurological: Negative for dizziness or headache.  No other specific  complaints in a complete review of systems (except as listed in HPI above).  Objective  Vitals:   02/18/20 1318  BP: 112/72  Pulse: 66  Resp: 16  Temp: 98.2 F (36.8 C)  SpO2: 100%  Weight: 177 lb 3.2 oz (80.4 kg)  Height: 5\' 6"  (1.676 m)    Body mass index is 28.6 kg/m.  Physical Exam  Constitutional: Patient appears well-developed and well-nourished. No distress.  HEENT: head atraumatic, normocephalic, pupils equal and reactive to light,  neck supple Cardiovascular: Normal rate, regular rhythm and normal heart sounds.  No murmur heard. No BLE edema. Pulmonary/Chest: Effort normal and breath sounds normal. No respiratory distress. Abdominal: Soft.  There is no tenderness. Psychiatric: Patient has a normal mood and affect. behavior is normal. Judgment and thought content normal.  Recent Results (from the past 2160 hour(s))  Cervicovaginal ancillary only     Status: Abnormal   Collection Time: 12/22/19  3:45 PM  Result Value Ref Range   Neisseria Gonorrhea Negative    Chlamydia Negative    Trichomonas Negative    Bacterial Vaginitis (gardnerella) Positive (A)    Candida Vaginitis Positive (A)    Candida Glabrata Negative    Comment      Normal  Reference Range Bacterial Vaginosis - Negative   Comment Normal Reference Range Candida Species - Negative    Comment Normal Reference Range Candida Galbrata - Negative    Comment Normal Reference Range Trichomonas - Negative    Comment Normal Reference Ranger Chlamydia - Negative    Comment      Normal Reference Range Neisseria Gonorrhea - Negative  Protein electrophoresis, serum     Status: None   Collection Time: 01/15/20  8:50 AM  Result Value Ref Range   Total Protein ELP 6.7 6.0 - 8.5 g/dL   Albumin ELP 3.6 2.9 - 4.4 g/dL   Alpha-1-Globulin 0.2 0.0 - 0.4 g/dL   Alpha-2-Globulin 0.8 0.4 - 1.0 g/dL   Beta Globulin 0.9 0.7 - 1.3 g/dL   Gamma Globulin 1.3 0.4 - 1.8 g/dL   M-Spike, % Not Observed Not Observed g/dL   SPE Interp. Comment     Comment: (NOTE) The SPE pattern appears unremarkable. Evidence of monoclonal protein is not apparent. Performed At: [facility] [place], [place] [id number] Rush Farmer MD [id number]    Comment Comment     Comment: (NOTE) Protein electrophoresis scan will follow via computer, mail, or courier delivery.    Globulin, Total 3.1 2.2 - 3.9 g/dL   A/G Ratio 1.2 0.7 - 1.7  Cervicovaginal ancillary only     Status: Abnormal   Collection Time: 02/06/20  2:05 PM  Result Value Ref Range   Bacterial Vaginitis (gardnerella) Positive (A)    Candida Vaginitis Positive (A)    Candida Glabrata Negative    Comment      Normal Reference Range Bacterial Vaginosis - Negative   Comment Normal Reference Range Candida Species - Negative    Comment Normal Reference Range Candida Galbrata - Negative   CA 125     Status: None   Collection Time: 02/06/20  2:21 PM  Result Value Ref Range   Cancer Antigen (CA) 125 7.8 0.0 - 38.1 U/mL    Comment: Roche [employer] Electrochemiluminescence Immunoassay (ECLIA) Values obtained with different assay methods or kits cannot be used interchangeably.  Results cannot be interpreted as  absolute evidence of the presence or absence of malignant disease.       PHQ2/9:  Depression screen [facility] 2/9 02/18/2020 12/22/2019 11/12/2019 09/10/2019 08/13/2019  Decreased Interest 0 1 0 0 0  Down, Depressed, Hopeless 0 0 0 0 0  PHQ - 2 Score 0 1 0 0 0  Altered sleeping 3 3 0 0 0  Tired, decreased energy 0 3 0 0 0  Change in appetite 0 1 0 0 0  Feeling bad or failure about yourself  0 1 0 0 0  Trouble concentrating 0 1 0 0 0  Moving slowly or fidgety/restless 0 1 0 0 0  Suicidal thoughts 0 0 0 0 0  PHQ-9 Score 3 11 0 0 0  Difficult doing work/chores Somewhat difficult Somewhat difficult - - -  Some recent data might be hidden    phq 9 is negative   Fall Risk: Fall Risk  02/18/2020 12/22/2019 11/13/2019 11/12/2019 09/10/2019  Falls in the past year? 0 0 0 0 0  Number falls in past yr: 0 0 0 0 0  Injury with Fall? 0 0 0 0 0  Comment - - - - -     Functional Status Survey: Is the patient deaf or have difficulty hearing?: Yes Does the patient have difficulty seeing, even when wearing glasses/contacts?: No Does the patient have difficulty concentrating, remembering, or making decisions?: No Does the patient have difficulty walking or climbing stairs?: No Does the patient have difficulty dressing or bathing?: No Does the patient have difficulty doing errands alone such as visiting a doctor's office or shopping?: No    Assessment & Plan  1. Chronic deep vein thrombosis (DVT) of distal vein of left lower extremity ([place])  Under the care of hematologist and on long term anticoagulation   2. Monoclonal gammopathy of unknown significance (MGUS)  Sees Hematologist   3. GAD (generalized anxiety disorder)   4. Migraine with aura and without status migrainosus, not intractable  - Ambulatory referral to Neurology  5. Hyperglycemia   6. Protein C deficiency ([place])   7. Vitamin D deficiency  On supplementation   8. Overweight (BMI 25.0-29.9)  - SAXENDA 18 MG/3ML SOPN; Inject  3 mg into the skin daily.  Dispense: 15 mL; Refill: 1  9. Recurrent vaginitis  Recently treated again by gyn, advised to contact Dr. Kenton Kingfisher and if needed to call me back

## 2020-02-18 ENCOUNTER — Encounter: Payer: Self-pay | Admitting: Obstetrics and Gynecology

## 2020-02-18 ENCOUNTER — Ambulatory Visit (INDEPENDENT_AMBULATORY_CARE_PROVIDER_SITE_OTHER): Payer: 59 | Admitting: Family Medicine

## 2020-02-18 ENCOUNTER — Other Ambulatory Visit: Payer: Self-pay | Admitting: Obstetrics & Gynecology

## 2020-02-18 ENCOUNTER — Other Ambulatory Visit: Payer: Self-pay

## 2020-02-18 VITALS — BP 112/72 | HR 66 | Temp 98.2°F | Resp 16 | Ht 66.0 in | Wt 177.2 lb

## 2020-02-18 DIAGNOSIS — G43109 Migraine with aura, not intractable, without status migrainosus: Secondary | ICD-10-CM | POA: Diagnosis not present

## 2020-02-18 DIAGNOSIS — D472 Monoclonal gammopathy: Secondary | ICD-10-CM | POA: Diagnosis not present

## 2020-02-18 DIAGNOSIS — E559 Vitamin D deficiency, unspecified: Secondary | ICD-10-CM

## 2020-02-18 DIAGNOSIS — F411 Generalized anxiety disorder: Secondary | ICD-10-CM

## 2020-02-18 DIAGNOSIS — R739 Hyperglycemia, unspecified: Secondary | ICD-10-CM

## 2020-02-18 DIAGNOSIS — I825Z2 Chronic embolism and thrombosis of unspecified deep veins of left distal lower extremity: Secondary | ICD-10-CM

## 2020-02-18 DIAGNOSIS — N760 Acute vaginitis: Secondary | ICD-10-CM

## 2020-02-18 DIAGNOSIS — E663 Overweight: Secondary | ICD-10-CM

## 2020-02-18 DIAGNOSIS — D6859 Other primary thrombophilia: Secondary | ICD-10-CM

## 2020-02-18 MED ORDER — METRONIDAZOLE 500 MG PO TABS
500.0000 mg | ORAL_TABLET | Freq: Two times a day (BID) | ORAL | 0 refills | Status: DC
Start: 2020-02-18 — End: 2020-02-25

## 2020-02-18 MED ORDER — SAXENDA 18 MG/3ML ~~LOC~~ SOPN: 3.0000 mg | PEN_INJECTOR | Freq: Every day | SUBCUTANEOUS | 1 refills | Status: DC

## 2020-02-24 ENCOUNTER — Encounter: Payer: Self-pay | Admitting: Family Medicine

## 2020-02-25 ENCOUNTER — Ambulatory Visit: Payer: 59 | Admitting: Family Medicine

## 2020-02-25 ENCOUNTER — Other Ambulatory Visit: Payer: Self-pay

## 2020-02-25 ENCOUNTER — Encounter: Payer: Self-pay | Admitting: Family Medicine

## 2020-02-25 VITALS — BP 130/86 | HR 83 | Temp 98.3°F | Resp 18 | Ht 66.0 in | Wt 176.4 lb

## 2020-02-25 DIAGNOSIS — G43111 Migraine with aura, intractable, with status migrainosus: Secondary | ICD-10-CM

## 2020-02-25 MED ORDER — PROMETHAZINE HCL 25 MG/ML IJ SOLN
25.0000 mg | Freq: Once | INTRAMUSCULAR | Status: AC
  Administered 2020-02-25: 25 mg via INTRAMUSCULAR

## 2020-02-25 MED ORDER — NURTEC 75 MG PO TBDP: 1.0000 | ORAL_TABLET | Freq: Every day | ORAL | 1 refills | Status: DC | PRN

## 2020-02-25 MED ORDER — DEXAMETHASONE SODIUM PHOSPHATE 10 MG/ML IJ SOLN
10.0000 mg | Freq: Once | INTRAMUSCULAR | Status: AC
  Administered 2020-02-25: 10 mg via INTRAMUSCULAR

## 2020-02-25 MED ORDER — KETOROLAC TROMETHAMINE 60 MG/2ML IM SOLN
60.0000 mg | Freq: Once | INTRAMUSCULAR | Status: AC
  Administered 2020-02-25: 60 mg via INTRAMUSCULAR

## 2020-02-25 NOTE — Patient Instructions (Signed)

## 2020-02-25 NOTE — Progress Notes (Signed)
Name: Tammy Garza   MRN: [id number]    DOB: Apr 15, 1981   Date:02/25/2020       Progress Note  Subjective  Chief Complaint  Migraine  HPI Migraine intractable: she used to be under the care of neurologist in [place] but is currently switching providers. Her migraine has been well controlled, however states every Fall she has a severe episode that requires steroid taper, and Demerol injection. This episode started on Thursday ( 6 days ago) it was the day after she received COVID-19 and she thought it would pass, however she continues to have throbbing frontal and nuchal pain, allodynia, nausea, photophobia . She states it feels similar to her previous migraine episodes during Fall months. No neuro deficit    Patient Active Problem List   Diagnosis Date Noted  . Abnormal mammogram 10/15/2019  . History of hemorrhoidectomy   . Hypocalcemia 10/13/2018  . Protein C deficiency ([place]) 12/10/2016  . Monoclonal gammopathy of unknown significance (MGUS) 11/30/2016  . History of DVT (deep vein thrombosis) 11/02/2016  . Elevated blood protein 09/26/2016  . Chronic deep vein thrombosis (DVT) of distal vein of left lower extremity ([place]) 09/17/2016  . Vitamin D deficiency 07/26/2015  . Thyroid cyst 07/26/2015  . History of Helicobacter pylori infection 04/22/2015  . Migraine with aura and without status migrainosus 04/22/2015  . Shift work sleep disorder 04/22/2015    Past Surgical History:  Procedure Laterality Date  . ABDOMINAL HYSTERECTOMY    . ANTERIOR CRUCIATE LIGAMENT REPAIR Left   . BREAST BIOPSY Left 10/07/2019   left breast calcs x clip  . CHOLECYSTECTOMY    . CYSTOSCOPY  12/19/2016   Procedure: CYSTOSCOPY;  Surgeon: Gae Dry, MD;  Location: [facility] ORS;  Service: Gynecology;;  . ESOPHAGOGASTRODUODENOSCOPY (EGD) WITH PROPOFOL N/A 09/25/2017   Procedure: ESOPHAGOGASTRODUODENOSCOPY (EGD) WITH PROPOFOL;  Surgeon: Jonathon Bellows, MD;  Location: [facility] ENDOSCOPY;  Service:  Gastroenterology;  Laterality: N/A;  . LAPAROSCOPIC HYSTERECTOMY Bilateral 12/19/2016   Procedure: HYSTERECTOMY TOTAL LAPAROSCOPIC BILATERAL SALPINGECTOMY;  Surgeon: Gae Dry, MD;  Location: [facility] ORS;  Service: Gynecology;  Laterality: Bilateral;  . PLANTAR FASCIA SURGERY  06/2019  . RECTAL EXAM UNDER ANESTHESIA N/A 02/26/2019   Procedure: RECTAL EXAM UNDER ANESTHESIA, EXCISION OF ANAL SKIN TAG;  Surgeon: Fredirick Maudlin, MD;  Location: [facility] ORS;  Service: General;  Laterality: N/A;  . TUBAL LIGATION      Family History  Problem Relation Age of Onset  . HIV Sister   . Ovarian cancer Sister 34  . Asthma Daughter   . Protein C deficiency Daughter   . Brain cancer Daughter   . Cancer Daughter   . Hypertension Maternal Grandmother   . Hypertension Mother   . Obesity Mother   . Cancer Father 16       lung cancer  . Deep vein thrombosis Father   . Protein C deficiency Father   . COPD Father   . Congestive Heart Failure Father   . Diabetes Brother   . COPD Paternal Grandmother   . Deep vein thrombosis Paternal Grandmother   . Protein C deficiency Daughter   . Breast cancer Neg Hx     Social History   Tobacco Use  . Smoking status: Former Smoker    Packs/day: 0.25    Years: 5.00    Pack years: 1.25    Types: Cigarettes    Start date: 05/15/2002    Quit date: 05/16/2007    Years since quitting: 12.7  . Smokeless tobacco:  Never Used  Substance Use Topics  . Alcohol use: Yes    Alcohol/week: 1.0 standard drink    Types: 1 Glasses of wine per week    Comment: occ wine none last 24 hrs     Current Outpatient Medications:  .  BD PEN NEEDLE NANO 2ND GEN 32G X 4 MM MISC, , Disp: , Rfl:  .  Black Cohosh 540 MG CAPS, Take by mouth., Disp: , Rfl:  .  ELIQUIS 5 MG TABS tablet, TAKE 1 TABLET BY MOUTH TWICE A DAY, Disp: 60 tablet, Rfl: 1 .  fluconazole (DIFLUCAN) 150 MG tablet, Take by mouth., Disp: , Rfl:  .  PARoxetine (PAXIL-CR) 12.5 MG 24 hr tablet, TAKE 1 TABLET BY MOUTH  EVERY DAY, Disp: 90 tablet, Rfl: 1 .  SAXENDA 18 MG/3ML SOPN, Inject 3 mg into the skin daily., Disp: 15 mL, Rfl: 1 .  Vitamin D, Ergocalciferol, (DRISDOL) 1.25 MG (50000 UNIT) CAPS capsule, Take 50,000 Units by mouth every 7 (seven) days., Disp: , Rfl:  .  Rimegepant Sulfate (NURTEC) 75 MG TBDP, Take 1 tablet by mouth daily as needed. May repeat in 2 hours, no more than 2 in 24 hours for migraines, Disp: 8 tablet, Rfl: 1  Allergies  Allergen Reactions  . Topamax [Topiramate] Other (See Comments)    Confusion , slurred speech   . Effexor [Venlafaxine]     Gas and bloating     I personally reviewed active problem list, medication list, allergies, family history, social history, health maintenance with the patient/caregiver today.   ROS  Ten systems reviewed and is negative except as mentioned in HPI   Objective  Vitals:   02/25/20 1404  BP: 130/86  Pulse: 83  Resp: 18  Temp: 98.3 F (36.8 C)  TempSrc: Oral  SpO2: 99%  Weight: 176 lb 6.4 oz (80 kg)  Height: 5\' 6"  (1.676 m)    Body mass index is 28.47 kg/m.  Physical Exam  Constitutional: Patient appears well-developed and well-nourished.  Sitting in the dark. Photophobia  HEENT: head atraumatic, normocephalic,neck supple, throat within normal limits Cardiovascular: Normal rate, regular rhythm and normal heart sounds.  No murmur heard. No BLE edema. Pulmonary/Chest: Effort normal and breath sounds normal. No respiratory distress. Abdominal: Soft.  There is no tenderness. Neurological: no focal findings. Normal strength, cranial nerves, and sensation  Psychiatric: Patient has a normal mood and affect. behavior is normal. Judgment and thought content normal.  Recent Results (from the past 2160 hour(s))  Cervicovaginal ancillary only     Status: Abnormal   Collection Time: 12/22/19  3:45 PM  Result Value Ref Range   Neisseria Gonorrhea Negative    Chlamydia Negative    Trichomonas Negative    Bacterial Vaginitis  (gardnerella) Positive (A)    Candida Vaginitis Positive (A)    Candida Glabrata Negative    Comment      Normal Reference Range Bacterial Vaginosis - Negative   Comment Normal Reference Range Candida Species - Negative    Comment Normal Reference Range Candida Galbrata - Negative    Comment Normal Reference Range Trichomonas - Negative    Comment Normal Reference Ranger Chlamydia - Negative    Comment      Normal Reference Range Neisseria Gonorrhea - Negative  Protein electrophoresis, serum     Status: None   Collection Time: 01/15/20  8:50 AM  Result Value Ref Range   Total Protein ELP 6.7 6.0 - 8.5 g/dL   Albumin ELP 3.6 2.9 -  4.4 g/dL   Alpha-1-Globulin 0.2 0.0 - 0.4 g/dL   Alpha-2-Globulin 0.8 0.4 - 1.0 g/dL   Beta Globulin 0.9 0.7 - 1.3 g/dL   Gamma Globulin 1.3 0.4 - 1.8 g/dL   M-Spike, % Not Observed Not Observed g/dL   SPE Interp. Comment     Comment: (NOTE) The SPE pattern appears unremarkable. Evidence of monoclonal protein is not apparent. Performed At: [facility] [place], [place] [id number] Rush Farmer MD [id number]    Comment Comment     Comment: (NOTE) Protein electrophoresis scan will follow via computer, mail, or courier delivery.    Globulin, Total 3.1 2.2 - 3.9 g/dL   A/G Ratio 1.2 0.7 - 1.7  Cervicovaginal ancillary only     Status: Abnormal   Collection Time: 02/06/20  2:05 PM  Result Value Ref Range   Bacterial Vaginitis (gardnerella) Positive (A)    Candida Vaginitis Positive (A)    Candida Glabrata Negative    Comment      Normal Reference Range Bacterial Vaginosis - Negative   Comment Normal Reference Range Candida Species - Negative    Comment Normal Reference Range Candida Galbrata - Negative   CA 125     Status: None   Collection Time: 02/06/20  2:21 PM  Result Value Ref Range   Cancer Antigen (CA) 125 7.8 0.0 - 38.1 U/mL    Comment: Roche [employer] Electrochemiluminescence Immunoassay (ECLIA) Values  obtained with different assay methods or kits cannot be used interchangeably.  Results cannot be interpreted as absolute evidence of the presence or absence of malignant disease.       PHQ2/9: Depression screen [facility] 2/9 02/25/2020 02/18/2020 12/22/2019 11/12/2019 09/10/2019  Decreased Interest 0 0 1 0 0  Down, Depressed, Hopeless 0 0 0 0 0  PHQ - 2 Score 0 0 1 0 0  Altered sleeping - 3 3 0 0  Tired, decreased energy - 0 3 0 0  Change in appetite - 0 1 0 0  Feeling bad or failure about yourself  - 0 1 0 0  Trouble concentrating - 0 1 0 0  Moving slowly or fidgety/restless - 0 1 0 0  Suicidal thoughts - 0 0 0 0  PHQ-9 Score - 3 11 0 0  Difficult doing work/chores - Somewhat difficult Somewhat difficult - -  Some recent data might be hidden    phq 9 is negative   Fall Risk: Fall Risk  02/25/2020 02/18/2020 12/22/2019 11/13/2019 11/12/2019  Falls in the past year? 0 0 0 0 0  Number falls in past yr: 0 0 0 0 0  Injury with Fall? 0 0 0 0 0  Comment - - - - -     Functional Status Survey: Is the patient deaf or have difficulty hearing?: Yes Does the patient have difficulty seeing, even when wearing glasses/contacts?: No Does the patient have difficulty concentrating, remembering, or making decisions?: No Does the patient have difficulty walking or climbing stairs?: No Does the patient have difficulty dressing or bathing?: No Does the patient have difficulty doing errands alone such as visiting a doctor's office or shopping?: No    Assessment & Plan  1. Intractable migraine with aura with status migrainosus  - promethazine (PHENERGAN) injection 25 mg - ketorolac (TORADOL) injection 60 mg - dexamethasone (DECADRON) injection 10 mg - Rimegepant Sulfate (NURTEC) 75 MG TBDP; Take 1 tablet by mouth daily as needed. May repeat in 2 hours, no more than 2 in 24  hours for migraines  Dispense: 8 tablet; Refill: 1

## 2020-03-05 ENCOUNTER — Ambulatory Visit: Payer: 59 | Admitting: Obstetrics & Gynecology

## 2020-03-05 ENCOUNTER — Ambulatory Visit: Payer: 59

## 2020-03-05 ENCOUNTER — Other Ambulatory Visit: Payer: Self-pay | Admitting: Hematology and Oncology

## 2020-03-07 ENCOUNTER — Other Ambulatory Visit: Payer: Self-pay | Admitting: Family Medicine

## 2020-03-07 DIAGNOSIS — G4709 Other insomnia: Secondary | ICD-10-CM

## 2020-03-23 ENCOUNTER — Ambulatory Visit (INDEPENDENT_AMBULATORY_CARE_PROVIDER_SITE_OTHER): Payer: 59

## 2020-03-23 ENCOUNTER — Encounter: Payer: Self-pay | Admitting: Obstetrics & Gynecology

## 2020-03-23 ENCOUNTER — Other Ambulatory Visit: Payer: Self-pay

## 2020-03-23 ENCOUNTER — Other Ambulatory Visit: Payer: Self-pay | Admitting: Obstetrics & Gynecology

## 2020-03-23 ENCOUNTER — Ambulatory Visit (INDEPENDENT_AMBULATORY_CARE_PROVIDER_SITE_OTHER): Payer: 59 | Admitting: Obstetrics & Gynecology

## 2020-03-23 VITALS — BP 120/80 | Ht 66.0 in | Wt 174.0 lb

## 2020-03-23 DIAGNOSIS — Z8041 Family history of malignant neoplasm of ovary: Secondary | ICD-10-CM | POA: Diagnosis not present

## 2020-03-23 DIAGNOSIS — R1900 Intra-abdominal and pelvic swelling, mass and lump, unspecified site: Secondary | ICD-10-CM | POA: Insufficient documentation

## 2020-03-23 NOTE — Progress Notes (Signed)
HPI: Pt reports a FH of ovarian cancer (sister).  She denies bloating, LQ pains, or weight changes.  Prior TLH.  CA 125 normal.  Also, she reports continued vaginal irritation, with concern that new flushable wipes may be contributing.  She was treated for BV last month.  Ultrasound demonstrates - see below  PMHx: She  has a past medical history of Anemia, BRCA negative (01/2020), Cervical high risk human papillomavirus (HPV) DNA test positive, Chronic gastritis, Depression, DVT (deep venous thrombosis) ([place]) (09/2016), Family history of ovarian cancer, GERD (gastroesophageal reflux disease), H. pylori infection, History of attempted suicide, History of gastric polyp, History of kidney stones, Hyperhidrosis, Increased risk of breast cancer (01/2020), Migraines, Protein C deficiency ([place]), Rash, and Shift work sleep disorder. Also,  has a past surgical history that includes Tubal ligation; Cholecystectomy; Laparoscopic hysterectomy (Bilateral, 12/19/2016); Cystoscopy (12/19/2016); Abdominal hysterectomy; Anterior cruciate ligament repair (Left); Esophagogastroduodenoscopy (egd) with propofol (N/A, 09/25/2017); Rectal exam under anesthesia (N/A, 02/26/2019); Plantar fascia surgery (06/2019); and Breast biopsy (Left, 10/07/2019)., family history includes Asthma in her daughter; Brain cancer in her daughter; COPD in her father and paternal grandmother; Cancer in her daughter; Cancer (age of onset: 31) in her father; Congestive Heart Failure in her father; Deep vein thrombosis in her father and paternal grandmother; Diabetes in her brother; HIV in her sister; Hypertension in her maternal grandmother and mother; Obesity in her mother; Ovarian cancer (age of onset: 64) in her sister; Protein C deficiency in her daughter, daughter, and father.,  reports that she quit smoking about 12 years ago. Her smoking use included cigarettes. She started smoking about 17 years ago. She has a 1.25 pack-year smoking history. She has  never used smokeless tobacco. She reports current alcohol use of about 1.0 standard drink of alcohol per week. She reports current drug use. Drug: Marijuana.  She has a current medication list which includes the following prescription(s): bd pen needle nano 2nd gen, black cohosh, eliquis, fluconazole, paroxetine, nurtec, saxenda, and vitamin d (ergocalciferol). Also, is allergic to topamax [topiramate] and effexor [venlafaxine].  Review of Systems  All other systems reviewed and are negative.   Objective: BP 120/80   Ht _0  (1.676 m)   Wt 174 lb (78.9 kg)   LMP 01/04/2016 Comment: partial hysterectomy, tubal   BMI 28.08 kg/m   Physical examination Constitutional NAD, Conversant  Skin No rashes, lesions or ulceration.   Extremities: Moves all appropriately.  Normal ROM for age. No lymphadenopathy.  Neuro: Grossly intact  Psych: Oriented to PPT.  Normal mood. Normal affect.   US PELVIS TRANSVAGINAL NON-OB (TV ONLY)  Result Date: 03/23/2020 Patient Name: Alfie Alderfer DOB: 1980-08-17 MRN: [id number] ULTRASOUND REPORT Location: [facility] Date of Service: 03/23/2020 Indications:R/O ovarian cancer Findings: The uterus and cervix have been removed. Right Ovary measures 3.4 x 3.6 x 1.7 cm. It is normal in appearance. There is a 14.7 mm corpus luteum in the right ovary. Left Ovary measures 2.5 x 1.6 x 1.0 cm. It is normal in appearance. Survey of the adnexa demonstrates a cyst with anechoic fluid and free floating debris in the right adnexa adjacent to the right ovary. It measures 47.1 x 16.4 x 26.1 mm. No blood flow is seen within this cystic area. There is There is scant free fluid in the right adnexa adjacent to the RLQ cyst. Impression: 1. Hysterectomy noted. 2. Normal appearing ovaries. 3. There is a cyst with anechoic fluid and free floating debris in the right adnexa. Recommendations: 1.Clinical correlation  with the patient's History and Physical Exam. Gweneth Dimitri, RT\ Review of  ULTRASOUND.    I have personally reviewed images and report of recent ultrasound done at [facility].    Plan of management to be discussed with patient. Barnett Applebaum, MD, [facility], [facility] 03/23/2020  1:46 PM   Assessment:  Family history of ovarian cancer  Pelvic mass in female Recurrent BV   - Discussed options; no sx's and neg CA125 (not 100% accurate but reassuring); risks of surgery esp w prior h/o DVT; risks of cancer low but possible. - Option 1 is surgery to remove this 4 cm cystic pelvic right sided mass, and also likely do BSO at that time to remove risks for cancer in the future, with the consequence of menopause and its treatment options and risks discussed. - Option 2 is conservative monitoring for sx's and repeat US in 2 mos to assess progression or regression. - Option 3 is to see Gyn Onc for additional opinions and insights.  Pt prefers for current conservative option. - Plan: US PELVIC COMPLETE WITH TRANSVAGINAL  Discussed stopping use of flushable wipes and assessing for recurrent BV sx's.  Consideration for Boric Acid preventative use discussed as well.  A total of 20 minutes were spent face-to-face with the patient as well as preparation, review, communication, and documentation during this encounter.   Barnett Applebaum, MD, [facility], [facility] 03/23/2020  2:09 PM

## 2020-03-25 ENCOUNTER — Other Ambulatory Visit: Payer: Self-pay | Admitting: Obstetrics & Gynecology

## 2020-03-25 MED ORDER — CLOTRIMAZOLE-BETAMETHASONE 1-0.05 % EX CREA: 1.0000 "application " | TOPICAL_CREAM | Freq: Two times a day (BID) | CUTANEOUS | 0 refills | Status: DC

## 2020-03-29 NOTE — Progress Notes (Signed)
Patient came in today to pick up custom made foot orthotics.  The goals were accomplished and the patient reported no dissatisfaction with said orthotics.  Patient was advised of breakin period and how to report any issues. 

## 2020-04-14 ENCOUNTER — Other Ambulatory Visit: Payer: Self-pay | Admitting: Obstetrics & Gynecology

## 2020-04-14 MED ORDER — FLUCONAZOLE 150 MG PO TABS: 150.0000 mg | ORAL_TABLET | Freq: Once | ORAL | 0 refills | Status: AC

## 2020-04-14 MED ORDER — BORIC ACID CRYS: 600.0000 mg | CRYSTALS | 5 refills | Status: DC

## 2020-04-14 NOTE — Progress Notes (Signed)
[facility]  [address], [address] [place], [place] [address] Phone: [phone]  Fax: [phone]   Office Visit:  04/15/2020  Referring physician: [name], MD  Chief Complaint: Tammy Garza is a 39 y.o. female with a DVT of the left lower extremity and protein C deficiency on Eliquis who is seen for 6 month assessment.   HPI: The patient was last seen in the hematology clinic on 10/15/2019. At that time, she denied any bruising or bleeding.  She had some left calf tightness after interval plantar fasciitis surgery.  Exam was unremarkable. Hematocrit was 38.8, hemoglobin 12.3, platelets 267,000, WBC 6,800. Calcium was 8.6. She continued Eliquis. We discussed calcium supplementation.  The patient reached out via [name] on 11/06/2019 stating that she thought she had a clot due to right calf pain and swelling. She saw Tammy Casa, NP, in the symptom management clinic. She had not missed any doses of Eliquis. Right lower extremity venous duplex was negative for DVT.  The patient tested positive for COVID-19 on 12/30/2019. She has sinus symptoms. She has not completely gained back her sense of smell.  SPEP was normal on 01/15/2020.  Transvaginal ultrasound on 03/23/2020 revealed a cyst with anechoic fluid and free floating debris in the right adnexa. She is s/p hysterectomy. Ovaries appeared normal. She is scheduled for a follow up ultrasound on 05/25/2020.  During the interim, she has been "alright". She recovered well from her plantar fasciitis surgery. The patient has not been taking calcium lately. She is still taking Eliquis, but notes that her insurance is no longer going to cover it after this month.  Her 64 year old daughter was diagnosed with glioblastoma; she is being treated at [facility].   Past Medical History:  Diagnosis Date  . Anemia    h/o  . BRCA negative 01/2020   MyRisk neg except AXIN2 and RPS20 VUS  . Cervical high risk human  papillomavirus (HPV) DNA test positive   . Chronic gastritis   . Depression   . DVT (deep venous thrombosis) ([place]) 09/2016   left leg  . Family history of ovarian cancer   . GERD (gastroesophageal reflux disease)   . H. pylori infection   . History of attempted suicide   . History of gastric polyp   . History of kidney stones    h/o  . Hyperhidrosis   . Increased risk of breast cancer 01/2020   IBIS=20.5%  . Migraines   . Protein C deficiency ([place])   . Rash   . Shift work sleep disorder     Past Surgical History:  Procedure Laterality Date  . ABDOMINAL HYSTERECTOMY    . ANTERIOR CRUCIATE LIGAMENT REPAIR Left   . BREAST BIOPSY Left 10/07/2019   left breast calcs x clip  . CHOLECYSTECTOMY    . CYSTOSCOPY  12/19/2016   Procedure: CYSTOSCOPY;  Surgeon: Gae Dry, MD;  Location: [facility] ORS;  Service: Gynecology;;  . ESOPHAGOGASTRODUODENOSCOPY (EGD) WITH PROPOFOL N/A 09/25/2017   Procedure: ESOPHAGOGASTRODUODENOSCOPY (EGD) WITH PROPOFOL;  Surgeon: Jonathon Bellows, MD;  Location: [facility];  Service: Gastroenterology;  Laterality: N/A;  . LAPAROSCOPIC HYSTERECTOMY Bilateral 12/19/2016   Procedure: HYSTERECTOMY TOTAL LAPAROSCOPIC BILATERAL SALPINGECTOMY;  Surgeon: Gae Dry, MD;  Location: [facility] ORS;  Service: Gynecology;  Laterality: Bilateral;  . PLANTAR FASCIA SURGERY  06/2019  . RECTAL EXAM UNDER ANESTHESIA N/A 02/26/2019   Procedure: RECTAL EXAM UNDER ANESTHESIA, EXCISION OF ANAL SKIN TAG;  Surgeon: Fredirick Maudlin, MD;  Location: [facility]  ORS;  Service: General;  Laterality: N/A;  . TUBAL LIGATION      Family History  Problem Relation Age of Onset  . HIV Sister   . Ovarian cancer Sister 24  . Asthma Daughter   . Protein C deficiency Daughter   . Brain cancer Daughter   . Cancer Daughter   . Hypertension Maternal Grandmother   . Hypertension Mother   . Obesity Mother   . Cancer Father 5       lung cancer  . Deep vein thrombosis Father   . Protein C deficiency  Father   . COPD Father   . Congestive Heart Failure Father   . Diabetes Brother   . COPD Paternal Grandmother   . Deep vein thrombosis Paternal Grandmother   . Protein C deficiency Daughter   . Breast cancer Neg Hx     Social History:  reports that she quit smoking about 12 years ago. Her smoking use included cigarettes. She started smoking about 17 years ago. She has a 1.25 pack-year smoking history. She has never used smokeless tobacco. She reports current alcohol use of about 1.0 standard drink of alcohol per week. She reports current drug use. Drug: Marijuana. 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 She wears a mask at work. She has 2 daughters, one was recently diagnosed with glioblastoma. The patient is alone today.   Allergies:  Allergies  Allergen Reactions  . Topamax [Topiramate] Other (See Comments)    Confusion , slurred speech   . Effexor [Venlafaxine]     Gas and bloating     Current Medications: Current Outpatient Medications  Medication Sig Dispense Refill  . BD PEN NEEDLE NANO 2ND GEN 32G X 4 MM MISC     . Black Cohosh 540 MG CAPS Take by mouth.    . Boric Acid CRYS Place 600 mg vaginally 2 (two) times a week. 500 g 5  . clotrimazole-betamethasone (LOTRISONE) cream Apply 1 application topically 2 (two) times daily. 30 g 0  . ELIQUIS 5 MG TABS tablet TAKE 1 TABLET BY MOUTH TWICE A DAY 60 tablet 1  . PARoxetine (PAXIL-CR) 12.5 MG 24 hr tablet TAKE 1 TABLET BY MOUTH EVERY DAY 90 tablet 1  . Rimegepant Sulfate (NURTEC) 75 MG TBDP Take 1 tablet by mouth daily as needed. May repeat in 2 hours, no more than 2 in 24 hours for migraines 8 tablet 1  . SAXENDA 18 MG/3ML SOPN Inject 3 mg into the skin daily. 15 mL 1  . Vitamin D, Ergocalciferol, (DRISDOL) 1.25 MG (50000 UNIT) CAPS capsule Take  50,000 Units by mouth every 7 (seven) days.     No current facility-administered medications for this visit.     Review of Systems  Constitutional: Positive for weight loss (5 lbs). Negative for chills, diaphoresis, fever and malaise/fatigue.  HENT: Negative.  Negative for congestion, ear discharge, ear pain, hearing loss, nosebleeds, sinus pain, sore throat and tinnitus.   Eyes: Negative.  Negative for blurred vision and double vision.  Respiratory: Negative.  Negative for cough, hemoptysis, sputum production and shortness of breath.   Cardiovascular: Negative for chest pain, palpitations and leg swelling.  Gastrointestinal: Negative.  Negative for abdominal pain, blood in stool, constipation, diarrhea, heartburn, melena, nausea and vomiting.  Genitourinary: Negative.  Negative for dysuria,  frequency, hematuria and urgency.  Musculoskeletal: Negative.  Negative for back pain, joint pain, myalgias and neck pain.  Skin: Negative.  Negative for itching and rash.  Neurological: Negative for dizziness, tingling, sensory change, speech change, focal weakness, weakness and headaches.  Endo/Heme/Allergies: Negative.  Does not bruise/bleed easily.  Psychiatric/Behavioral: Negative.  Negative for depression and memory loss. The patient is not nervous/anxious and does not have insomnia.   All other systems reviewed and are negative.   Performance status (ECOG):  0  Vital Signs: Blood pressure 118/79, pulse 82, temperature (!) 96.2 F (35.7 C), temperature source Tympanic, resp. rate 16, weight 172 lb 11.7 oz (78.4 kg), last menstrual period 01/04/2016, SpO2 100 %.  Physical Exam Vitals and nursing note reviewed.  Constitutional:      General: She is not in acute distress.    Appearance: She is well-developed. She is not diaphoretic.  HENT:     Head: Normocephalic and atraumatic.     Mouth/Throat:     Mouth: Mucous membranes are moist.     Pharynx: Oropharynx is clear.  Eyes:     General:  No scleral icterus.       Right eye: No discharge.        Left eye: No discharge.     Extraocular Movements: Extraocular movements intact.     Conjunctiva/sclera: Conjunctivae normal.     Pupils: Pupils are equal, round, and reactive to light.     Comments: Brown eyes.  Neck:     Vascular: No JVD.  Cardiovascular:     Rate and Rhythm: Normal rate and regular rhythm.     Heart sounds: Normal heart sounds. No murmur heard.  No friction rub. No gallop.   Pulmonary:     Effort: Pulmonary effort is normal. No respiratory distress.     Breath sounds: Normal breath sounds. No wheezing or rales.  Chest:     Chest wall: No tenderness.  Abdominal:     General: Bowel sounds are normal. There is no distension.     Palpations: Abdomen is soft. There is no mass.     Tenderness: There is no abdominal tenderness. There is no guarding or rebound.  Musculoskeletal:        General: No tenderness. Normal range of motion.     Cervical back: Normal range of motion and neck supple.  Lymphadenopathy:     Head:     Right side of head: No preauricular, posterior auricular or occipital adenopathy.     Left side of head: No preauricular, posterior auricular or occipital adenopathy.     Cervical: No cervical adenopathy.     Upper Body:     Right upper body: No supraclavicular or axillary adenopathy.     Left upper body: No supraclavicular or axillary adenopathy.  Skin:    General: Skin is warm and dry.     Coloration: Skin is not pale.     Findings: No erythema or rash.  Neurological:     Mental Status: She is alert and oriented to person, place, and time.  Psychiatric:        Behavior: Behavior normal.        Thought Content: Thought content normal.        Judgment: Judgment normal.    Appointment on 04/15/2020  Component Date Value Ref Range Status  . Sodium 04/15/2020 136  135 - 145 mmol/L Final  . Potassium 04/15/2020 4.1  3.5 - 5.1 mmol/L Final  . Chloride 04/15/2020 102  98 - 111 mmol/L  Final  . CO2 04/15/2020 26  22 - 32 mmol/L Final  . Glucose, Bld 04/15/2020 99  70 - 99 mg/dL Final   Glucose reference range applies only to samples taken after fasting for at least 8 hours.  . BUN 04/15/2020 12  6 - 20 mg/dL Final  . Creatinine, Ser 04/15/2020 0.78  0.44 - 1.00 mg/dL Final  . Calcium 04/15/2020 8.7* 8.9 - 10.3 mg/dL Final  . Total Protein 04/15/2020 7.8  6.5 - 8.1 g/dL Final  . Albumin 04/15/2020 4.4  3.5 - 5.0 g/dL Final  . AST 04/15/2020 17  15 - 41 U/L Final  . ALT 04/15/2020 14  0 - 44 U/L Final  . Alkaline Phosphatase 04/15/2020 43  38 - 126 U/L Final  . Total Bilirubin 04/15/2020 0.9  0.3 - 1.2 mg/dL Final  . GFR, Estimated 04/15/2020 >60  >60 mL/min Final   Comment: (NOTE) Calculated using the CKD-EPI Creatinine Equation (2021)   . Anion gap 04/15/2020 8  5 - 15 Final   Performed at [facility], [address], [place], [place] [address]  . WBC 04/15/2020 5.9  4.0 - 10.5 K/uL Final  . RBC 04/15/2020 4.31  3.87 - 5.11 MIL/uL Final  . Hemoglobin 04/15/2020 13.1  12.0 - 15.0 g/dL Final  . HCT 04/15/2020 40.5  36 - 46 % Final  . MCV 04/15/2020 94.0  80.0 - 100.0 fL Final  . MCH 04/15/2020 30.4  26.0 - 34.0 pg Final  . MCHC 04/15/2020 32.3  30.0 - 36.0 g/dL Final  . RDW 04/15/2020 12.4  11.5 - 15.5 % Final  . Platelets 04/15/2020 220  150 - 400 K/uL Final  . nRBC 04/15/2020 0.0  0.0 - 0.2 % Final  . Neutrophils Relative % 04/15/2020 38  % Final  . Neutro Abs 04/15/2020 2.3  1.7 - 7.7 K/uL Final  . Lymphocytes Relative 04/15/2020 46  % Final  . Lymphs Abs 04/15/2020 2.8  0.7 - 4.0 K/uL Final  . Monocytes Relative 04/15/2020 12  % Final  . Monocytes Absolute 04/15/2020 0.7  0.1 - 1.0 K/uL Final  . Eosinophils Relative 04/15/2020 3  % Final  . Eosinophils Absolute 04/15/2020 0.2  0.0 - 0.5 K/uL Final  . Basophils Relative 04/15/2020 1  % Final  . Basophils Absolute 04/15/2020 0.1  0.0 - 0.1 K/uL Final  . Immature Granulocytes 04/15/2020 0  %  Final  . Abs Immature Granulocytes 04/15/2020 0.01  0.00 - 0.07 K/uL Final   Performed at [facility], [address], [place], [place] [address]    Assessment:  Tammy Garza is a 39 y.o. female with protein C deficiencyand a left lower extremity DVTfollowing a 2 1/2 hour flight. She had been on DepoProvera x 1 year. She has a family history of thrombosis.  Left lower extremity duplexon 09/17/2016 revealed an acute appearing thrombus in the calf veins of the left lower extremity (posterior tibial and peroneal veins). Bilateral lower extremity duplexon 11/20/2016 revealed no evidence of acute DVT within either lower extremity. There was some residual nonocclusive thrombus in the left peroneal vein, sequela of previous DVT on the prior scan. Left lower extremity duplexon 07/05/2017 revealed an isolated thrombus of the left peroneal vein in the calf similar to 11/20/2016. There was no acute thrombus. She is on Eliquis.   Hypercoagulable work-up on 05/15/2018and 07/19/2018revealed the following negative studies: Factor V Leiden, prothrombin gene mutation, lupus anticoagulant panel, anticardiolipin  antibodies, beta2-glycoprotein, protein S antigen/activity, and ATIII antigen/activity. She has protein C deficiency. Labs on 11/30/2016 revealed a protein C total of 40% (60 - 150%) and protein C activity of 49% (73-180%). Repeat testing on 03/29/2017 revealed a protein C total of 51% and a protein C activity of 51%.  She has a monoclonal gammopathy of unknown significance. SPEP on 09/26/2016 was negative. Immunofixation revealed an IgA monoclonal protein with lambda light chain specificity. Free light chain assay was normal. IgG was 698 ([phone]). IgA was 182 (87-352). 24 hour UPEP on 10/22/2016 revealed no monoclonal protein. SPEPon 03/29/2017, 10/05/2017, 10/08/2018, 01/15/2019, and 01/15/2020 were negative.Free light chain ratiowas normal on 09/26/2016 and  10/05/2017.  Chest, abdomen, and pelvic CTon 12/08/2016 revealed no lymphadenopathy or hepatosplenomegaly.  She underwent total laparoscopic hysterectomywith bilateral salpingectomy on 12/19/2016. She was off Eliquis x 3 days (2 days pre-op and day of surgery). She developed a small superficial vein thrombus.  She underwent ACL reconstruction with autograft on 05/25/2017. Left lower extremity duplexon 07/05/2017 revealed an isolated thrombus of the left peroneal vein in the calf. As compared to 11/20/2016, the thrombus within the left peroneal vein appeared similar. There was no acute thrombus.  LLE venous doppler ultrasound on 12/04/2018 revealed no evidence of acute or chronic DVT within the LLE.  Digital screening bilateral mammogram on 09/18/2019 revealed calcifications in the left breast.  Digital diagnostic left mammogram on 10/01/2019 revealed a new 9 mm group of new amorphous calcifications. Left breast stereotactic core needle biopsy on 10/07/2019 revealed  benign breast tissue with cystic papillary apocrine metaplasia; negative for atypia and malignancy. No calcifications were identified. The absence of calcifications may refelect calcifications not surviving processing. Correlation with radiographic impression required.  She received the [place] COVID-19 vaccine on 02/19/2020 and 03/11/2020.  She tested positive for COVID-19 on 12/30/2019.  Symptomatically, she has felt "alright".  She denies any lower extremity swelling or shortness of breath.  She denies any bleeding.  Exam is stable.  Plan: 1.   Labs today: CBC with diff, CMP. 2.   Protein C deficiencyand left lower extremity DVT Clinically, she continues to do well.             Continue Eliquis. 3.   Monoclonal gammopathy of unknown significance M-spike was 0 on 01/15/2019 and 0 on 01/15/2020.  Immunofixation revealed an IgA monoclonal protein with lambda light chain specificity on  10/06/2016.   Significance unclear.  She denies any symptoms.  Review plan to follow-up on a as needed basis based on symptoms or concerning labs. 4.   Hypocalcemia             Calcium 8.7.  Encourage calcium supplementation. 5.   RN:  Talk to Elesa Massed re: coverage Eliquis. 6.   RN:  Follow-up with patient to ensure Eliquis has been covered. 7.   RTC in 6 months for MD assessment and labs (CBC with diff, CMP).  I discussed the assessment and treatment plan with the patient.The patient was provided an opportunity to ask questions and all were answered. The patient agreed with the plan and demonstrated an understanding of the instructions. The patient was advised to call back or seek an in person evaluation if the symptoms worsen or if the condition fails to improve as anticipated.   Nolon Stalls, MD, PhD  04/15/2020, 9:28 AM  I, Mirian Mo Tufford, am acting as a Education administrator for Lequita Asal, MD.   I, [place] Mike Gip, MD, have reviewed the above documentation  for accuracy and completeness, and I agree with the above.

## 2020-04-15 ENCOUNTER — Telehealth: Payer: Self-pay | Admitting: Pharmacist

## 2020-04-15 ENCOUNTER — Inpatient Hospital Stay: Payer: 59 | Attending: Hematology and Oncology

## 2020-04-15 ENCOUNTER — Inpatient Hospital Stay: Payer: 59 | Admitting: Hematology and Oncology

## 2020-04-15 ENCOUNTER — Encounter: Payer: Self-pay | Admitting: Hematology and Oncology

## 2020-04-15 ENCOUNTER — Other Ambulatory Visit: Payer: Self-pay

## 2020-04-15 ENCOUNTER — Telehealth: Payer: Self-pay

## 2020-04-15 VITALS — BP 118/79 | HR 82 | Temp 96.2°F | Resp 16 | Wt 172.7 lb

## 2020-04-15 DIAGNOSIS — Z86718 Personal history of other venous thrombosis and embolism: Secondary | ICD-10-CM | POA: Insufficient documentation

## 2020-04-15 DIAGNOSIS — Z8616 Personal history of COVID-19: Secondary | ICD-10-CM | POA: Insufficient documentation

## 2020-04-15 DIAGNOSIS — N6082 Other benign mammary dysplasias of left breast: Secondary | ICD-10-CM | POA: Insufficient documentation

## 2020-04-15 DIAGNOSIS — Z7901 Long term (current) use of anticoagulants: Secondary | ICD-10-CM | POA: Insufficient documentation

## 2020-04-15 DIAGNOSIS — D6859 Other primary thrombophilia: Secondary | ICD-10-CM | POA: Insufficient documentation

## 2020-04-15 DIAGNOSIS — D472 Monoclonal gammopathy: Secondary | ICD-10-CM | POA: Diagnosis not present

## 2020-04-15 DIAGNOSIS — Z79899 Other long term (current) drug therapy: Secondary | ICD-10-CM | POA: Diagnosis not present

## 2020-04-15 DIAGNOSIS — I825Z2 Chronic embolism and thrombosis of unspecified deep veins of left distal lower extremity: Secondary | ICD-10-CM

## 2020-04-15 DIAGNOSIS — R928 Other abnormal and inconclusive findings on diagnostic imaging of breast: Secondary | ICD-10-CM

## 2020-04-15 LAB — CBC WITH DIFFERENTIAL/PLATELET
Abs Immature Granulocytes: 0.01 10*3/uL (ref 0.00–0.07)
Basophils Absolute: 0.1 10*3/uL (ref 0.0–0.1)
Basophils Relative: 1 %
Eosinophils Absolute: 0.2 10*3/uL (ref 0.0–0.5)
Eosinophils Relative: 3 %
HCT: 40.5 % (ref 36.0–46.0)
Hemoglobin: 13.1 g/dL (ref 12.0–15.0)
Immature Granulocytes: 0 %
Lymphocytes Relative: 46 %
Lymphs Abs: 2.8 10*3/uL (ref 0.7–4.0)
MCH: 30.4 pg (ref 26.0–34.0)
MCHC: 32.3 g/dL (ref 30.0–36.0)
MCV: 94 fL (ref 80.0–100.0)
Monocytes Absolute: 0.7 10*3/uL (ref 0.1–1.0)
Monocytes Relative: 12 %
Neutro Abs: 2.3 10*3/uL (ref 1.7–7.7)
Neutrophils Relative %: 38 %
Platelets: 220 10*3/uL (ref 150–400)
RBC: 4.31 MIL/uL (ref 3.87–5.11)
RDW: 12.4 % (ref 11.5–15.5)
WBC: 5.9 10*3/uL (ref 4.0–10.5)
nRBC: 0 % (ref 0.0–0.2)

## 2020-04-15 LAB — COMPREHENSIVE METABOLIC PANEL
ALT: 14 U/L (ref 0–44)
AST: 17 U/L (ref 15–41)
Albumin: 4.4 g/dL (ref 3.5–5.0)
Alkaline Phosphatase: 43 U/L (ref 38–126)
Anion gap: 8 (ref 5–15)
BUN: 12 mg/dL (ref 6–20)
CO2: 26 mmol/L (ref 22–32)
Calcium: 8.7 mg/dL — ABNORMAL LOW (ref 8.9–10.3)
Chloride: 102 mmol/L (ref 98–111)
Creatinine, Ser: 0.78 mg/dL (ref 0.44–1.00)
GFR, Estimated: >60 mL/min (ref >60)
Glucose, Bld: 99 mg/dL (ref 70–99)
Potassium: 4.1 mmol/L (ref 3.5–5.1)
Sodium: 136 mmol/L (ref 135–145)
Total Bilirubin: 0.9 mg/dL (ref 0.3–1.2)
Total Protein: 7.8 g/dL (ref 6.5–8.1)

## 2020-04-15 NOTE — Telephone Encounter (Signed)
[name] contacted patient to let her know to come to [facility] to pick up Eliquis samples. Patient is aware and agreeable.

## 2020-04-15 NOTE — Telephone Encounter (Signed)
Oral Chemotherapy Pharmacist Encounter  Dispensed samples to patient:  Medication: Eliquis 5mg  tablets Instructions: Take 1 tablet (5mg ) by mouth twice daily Quantity dispensed: 28 Days supply: 14 Manufacturer: BMS Lot: [id number] Exp: 05/2021  Received message that patient was having an issue with her insurance coverage for Eliquis (MD is working on this). Called patient and she is going to stop by to pick up samples next week, will call ahead of stopping by the [facility].  Darl Pikes, PharmD, BCPS, [facility] Hematology/Oncology Clinical Pharmacist [facility]/HP/AP Oral [facility] [phone]  04/15/2020 10:18 AM

## 2020-04-15 NOTE — Progress Notes (Signed)
Pt in for follow up denies any difficulties physically.  Emotionally pt is dealing with a lot, 40 yr old daughter recently diagnosed with stage 4 glioblastoma. Currently a pt at [facility], pt is primary caregiver.

## 2020-04-16 ENCOUNTER — Encounter: Payer: Self-pay | Admitting: Hematology and Oncology

## 2020-04-16 ENCOUNTER — Encounter: Payer: Self-pay | Admitting: Family Medicine

## 2020-04-17 ENCOUNTER — Telehealth: Payer: Self-pay | Admitting: Oncology

## 2020-04-17 NOTE — Telephone Encounter (Signed)
Patient called and reports that she developed right lower extremity calf pain for the past few days, progressively getting worse.  Patient is concerned that she develops blood clot.  Patient has a history of protein C deficiency in the left lower extremity DVT.  On Eliquis 5 mg twice daily.  Patient admits that she may have missed the a few doses last week.  I recommend patient to go to emergency room or urgent care for further evaluation at ultrasound of right lower extremity to rule out DVT.  Patient voices understanding the advice and agrees with the plan.   Cc patient's primary hematology Dr. Mike Gip to follow-up next week.

## 2020-04-17 NOTE — Telephone Encounter (Signed)
  Let's see her in follow-up this week.  M

## 2020-04-19 ENCOUNTER — Ambulatory Visit
Admission: RE | Admit: 2020-04-19 | Discharge: 2020-04-19 | Disposition: A | Discharge status: Home / Self Care | Payer: 59 | Source: Ambulatory Visit | Attending: Family Medicine | Admitting: Family Medicine

## 2020-04-19 ENCOUNTER — Encounter: Payer: Self-pay | Admitting: Family Medicine

## 2020-04-19 ENCOUNTER — Other Ambulatory Visit: Payer: Self-pay

## 2020-04-19 ENCOUNTER — Ambulatory Visit (INDEPENDENT_AMBULATORY_CARE_PROVIDER_SITE_OTHER): Payer: 59 | Admitting: Family Medicine

## 2020-04-19 VITALS — BP 128/76 | HR 98 | Temp 98.4°F | Resp 16 | Ht 66.0 in | Wt 170.5 lb

## 2020-04-19 DIAGNOSIS — F411 Generalized anxiety disorder: Secondary | ICD-10-CM

## 2020-04-19 DIAGNOSIS — I825Z2 Chronic embolism and thrombosis of unspecified deep veins of left distal lower extremity: Secondary | ICD-10-CM | POA: Diagnosis not present

## 2020-04-19 DIAGNOSIS — M79661 Pain in right lower leg: Secondary | ICD-10-CM

## 2020-04-19 DIAGNOSIS — F341 Dysthymic disorder: Secondary | ICD-10-CM

## 2020-04-19 NOTE — Addendum Note (Signed)
Addended by: [name] F on: 04/19/2020 03:51 PM   Modules accepted: Orders

## 2020-04-19 NOTE — Addendum Note (Signed)
Addended by: [name] F on: 04/19/2020 03:53 PM   Modules accepted: Orders

## 2020-04-19 NOTE — Progress Notes (Addendum)
Name: Tammy Garza   MRN: [id number]    DOB: 11/19/1980   Date:04/19/2020       Progress Note  Subjective  Chief Complaint  Anxiety Stress  HPI  Stress: daughter had brain surgery and the day after Thanksgiving she had multiple seizures she went to [facility] and was transferred that day to [facility], 04/10/2020. She had a stroke secondary to bleeding, she is very concerned about her daughter. She is worried about losing her job, she needs FMLA. Her daughter was in  ICU but is now at regular floor bed, older daughter is 28 and not being supervised/and had to be admitted to a behavioral health center for one week due to attempting suicide back in May. She is constantly worried, unable to sleep , cannot focus.   She is struggling financially and does not have a lot of time to see psychiatrist, but she will check with her job to see if they offer virtual therapy visits so she can have support  Co-workers are helping her financially - gas money   Calf pain: she has a history of chronic DVT, she will see Dr. Mike Gip tomorrow, pain on right calf going on for the past 3-4 days, she has been sleeping with her daughter ( hospital chair) , we will get doppler US  Patient Active Problem List   Diagnosis Date Noted  . Family history of ovarian cancer 03/23/2020  . Pelvic mass in female 03/23/2020  . Abnormal mammogram 10/15/2019  . History of hemorrhoidectomy   . Hypocalcemia 10/13/2018  . Protein C deficiency ([place]) 12/10/2016  . Monoclonal gammopathy of unknown significance (MGUS) 11/30/2016  . History of DVT (deep vein thrombosis) 11/02/2016  . Elevated blood protein 09/26/2016  . Chronic deep vein thrombosis (DVT) of distal vein of left lower extremity ([place]) 09/17/2016  . Vitamin D deficiency 07/26/2015  . Thyroid cyst 07/26/2015  . History of Helicobacter pylori infection 04/22/2015  . Migraine with aura and without status migrainosus 04/22/2015  . Shift work sleep disorder 04/22/2015    Past  Surgical History:  Procedure Laterality Date  . ABDOMINAL HYSTERECTOMY    . ANTERIOR CRUCIATE LIGAMENT REPAIR Left   . BREAST BIOPSY Left 10/07/2019   left breast calcs x clip  . CHOLECYSTECTOMY    . CYSTOSCOPY  12/19/2016   Procedure: CYSTOSCOPY;  Surgeon: Gae Dry, MD;  Location: [facility] ORS;  Service: Gynecology;;  . ESOPHAGOGASTRODUODENOSCOPY (EGD) WITH PROPOFOL N/A 09/25/2017   Procedure: ESOPHAGOGASTRODUODENOSCOPY (EGD) WITH PROPOFOL;  Surgeon: Jonathon Bellows, MD;  Location[facility] ENDOSCOPY;  Service: Gastroenterology;  Laterality: N/A;  . LAPAROSCOPIC HYSTERECTOMY Bilateral 12/19/2016   Procedure: HYSTERECTOMY TOTAL LAPAROSCOPIC BILATERAL SALPINGECTOMY;  Surgeon: Gae Dry, MD;  Location: [facility] ORS;  Service: Gynecology;  Laterality: Bilateral;  . PLANTAR FASCIA SURGERY  06/2019  . RECTAL EXAM UNDER ANESTHESIA N/A 02/26/2019   Procedure: RECTAL EXAM UNDER ANESTHESIA, EXCISION OF ANAL SKIN TAG;  Surgeon: Fredirick Maudlin, MD;  Location: [facility] ORS;  Service: General;  Laterality: N/A;  . TUBAL LIGATION      Family History  Problem Relation Age of Onset  . HIV Sister   . Ovarian cancer Sister 72  . Asthma Daughter   . Protein C deficiency Daughter   . Brain cancer Daughter   . Cancer Daughter   . Hypertension Maternal Grandmother   . Hypertension Mother   . Obesity Mother   . Cancer Father 32       lung cancer  . Deep vein thrombosis Father   .  Protein C deficiency Father   . COPD Father   . Congestive Heart Failure Father   . Diabetes Brother   . COPD Paternal Grandmother   . Deep vein thrombosis Paternal Grandmother   . Protein C deficiency Daughter   . Breast cancer Neg Hx     Social History   Tobacco Use  . Smoking status: Former Smoker    Packs/day: 0.25    Years: 5.00    Pack years: 1.25    Types: Cigarettes    Start date: 05/15/2002    Quit date: 05/16/2007    Years since quitting: 12.9  . Smokeless tobacco: Never Used  Substance Use Topics  . Alcohol  use: Yes    Alcohol/week: 1.0 standard drink    Types: 1 Glasses of wine per week    Comment: occ wine none last 24 hrs     Current Outpatient Medications:  .  BD PEN NEEDLE NANO 2ND GEN 32G X 4 MM MISC, , Disp: , Rfl:  .  Black Cohosh 540 MG CAPS, Take by mouth., Disp: , Rfl:  .  Boric Acid CRYS, Place 600 mg vaginally 2 (two) times a week., Disp: 500 g, Rfl: 5 .  clotrimazole-betamethasone (LOTRISONE) cream, Apply 1 application topically 2 (two) times daily., Disp: 30 g, Rfl: 0 .  ELIQUIS 5 MG TABS tablet, TAKE 1 TABLET BY MOUTH TWICE A DAY, Disp: 60 tablet, Rfl: 1 .  PARoxetine (PAXIL-CR) 12.5 MG 24 hr tablet, TAKE 1 TABLET BY MOUTH EVERY DAY, Disp: 90 tablet, Rfl: 1 .  Rimegepant Sulfate (NURTEC) 75 MG TBDP, Take 1 tablet by mouth daily as needed. May repeat in 2 hours, no more than 2 in 24 hours for migraines, Disp: 8 tablet, Rfl: 1 .  SAXENDA 18 MG/3ML SOPN, Inject 3 mg into the skin daily., Disp: 15 mL, Rfl: 1 .  Vitamin D, Ergocalciferol, (DRISDOL) 1.25 MG (50000 UNIT) CAPS capsule, Take 50,000 Units by mouth every 7 (seven) days., Disp: , Rfl:   Allergies  Allergen Reactions  . Topamax [Topiramate] Other (See Comments)    Confusion , slurred speech   . Effexor [Venlafaxine]     Gas and bloating     I personally reviewed active problem list, medication list, allergies, family history, social history, health maintenance with the patient/caregiver today.   ROS  Ten systems reviewed and is negative except as mentioned in HPI   Objective  Vitals:   04/19/20 1435  BP: 128/76  Pulse: 98  Resp: 16  Temp: 98.4 F (36.9 C)  TempSrc: Oral  SpO2: 100%  Weight: 170 lb 8 oz (77.3 kg)  Height: 5\' 6"  (1.676 m)    Body mass index is 27.52 kg/m.  Physical Exam  Constitutional: Patient appears well-developed and well-nourished.  No distress.  HEENT: head atraumatic, normocephalic, pupils equal and reactive to light, neck supple Cardiovascular: Normal rate, regular rhythm  and normal heart sounds.  No murmur heard. No BLE edema. Pulmonary/Chest: Effort normal and breath sounds normal. No respiratory distress. Abdominal: Soft.  There is no tenderness. Muscular skeletal: pain during compression or right calf  Psychiatric: Patient has a normal mood and affect. behavior is normal. Judgment and thought content normal.  Recent Results (from the past 2160 hour(s))  Cervicovaginal ancillary only     Status: Abnormal   Collection Time: 02/06/20  2:05 PM  Result Value Ref Range   Bacterial Vaginitis (gardnerella) Positive (A)    Candida Vaginitis Positive (A)    Candida  Glabrata Negative    Comment      Normal Reference Range Bacterial Vaginosis - Negative   Comment Normal Reference Range Candida Species - Negative    Comment Normal Reference Range Candida Galbrata - Negative   CA 125     Status: None   Collection Time: 02/06/20  2:21 PM  Result Value Ref Range   Cancer Antigen (CA) 125 7.8 0.0 - 38.1 U/mL    Comment: [employer] Electrochemiluminescence Immunoassay (ECLIA) Values obtained with different assay methods or kits cannot be used interchangeably.  Results cannot be interpreted as absolute evidence of the presence or absence of malignant disease.   Comprehensive metabolic panel     Status: Abnormal   Collection Time: 04/15/20  8:24 AM  Result Value Ref Range   Sodium 136 135 - 145 mmol/L   Potassium 4.1 3.5 - 5.1 mmol/L   Chloride 102 98 - 111 mmol/L   CO2 26 22 - 32 mmol/L   Glucose, Bld 99 70 - 99 mg/dL    Comment: Glucose reference range applies only to samples taken after fasting for at least 8 hours.   BUN 12 6 - 20 mg/dL   Creatinine, Ser 0.78 0.44 - 1.00 mg/dL   Calcium 8.7 (L) 8.9 - 10.3 mg/dL   Total Protein 7.8 6.5 - 8.1 g/dL   Albumin 4.4 3.5 - 5.0 g/dL   AST 17 15 - 41 U/L   ALT 14 0 - 44 U/L   Alkaline Phosphatase 43 38 - 126 U/L   Total Bilirubin 0.9 0.3 - 1.2 mg/dL   GFR, Estimated >60 >60 mL/min    Comment:  (NOTE) Calculated using the CKD-EPI Creatinine Equation (2021)    Anion gap 8 5 - 15    Comment: Performed at [facility], [address], [place], [place] [address]  CBC with Differential     Status: None   Collection Time: 04/15/20  8:24 AM  Result Value Ref Range   WBC 5.9 4.0 - 10.5 K/uL   RBC 4.31 3.87 - 5.11 MIL/uL   Hemoglobin 13.1 12.0 - 15.0 g/dL   HCT 40.5 36 - 46 %   MCV 94.0 80.0 - 100.0 fL   MCH 30.4 26.0 - 34.0 pg   MCHC 32.3 30.0 - 36.0 g/dL   RDW 12.4 11.5 - 15.5 %   Platelets 220 150 - 400 K/uL   nRBC 0.0 0.0 - 0.2 %   Neutrophils Relative % 38 %   Neutro Abs 2.3 1.7 - 7.7 K/uL   Lymphocytes Relative 46 %   Lymphs Abs 2.8 0.7 - 4.0 K/uL   Monocytes Relative 12 %   Monocytes Absolute 0.7 0.1 - 1.0 K/uL   Eosinophils Relative 3 %   Eosinophils Absolute 0.2 0.0 - 0.5 K/uL   Basophils Relative 1 %   Basophils Absolute 0.1 0.0 - 0.1 K/uL   Immature Granulocytes 0 %   Abs Immature Granulocytes 0.01 0.00 - 0.07 K/uL    Comment: Performed at [facility], [address], [place], [place] [address]      PHQ2/9: Depression screen [facility] 2/9 04/19/2020 02/25/2020 02/18/2020 12/22/2019 11/12/2019  Decreased Interest 3 0 0 1 0  Down, Depressed, Hopeless 1 0 0 0 0  PHQ - 2 Score 4 0 0 1 0  Altered sleeping 3 - 3 3 0  Tired, decreased energy 3 - 0 3 0  Change in appetite 3 - 0 1 0  Feeling bad  or failure about yourself  1 - 0 1 0  Trouble concentrating 2 - 0 1 0  Moving slowly or fidgety/restless 3 - 0 1 0  Suicidal thoughts 0 - 0 0 0  PHQ-9 Score 19 - 3 11 0  Difficult doing work/chores Extremely dIfficult - Somewhat difficult Somewhat difficult -  Some recent data might be hidden    phq 9 is positive   Fall Risk: Fall Risk  04/19/2020 02/25/2020 02/18/2020 12/22/2019 11/13/2019  Falls in the past year? 0 0 0 0 0  Number falls in past yr: 0 0 0 0 0  Injury with Fall? 0 0 0 0 0  Comment - - - - -     Functional Status Survey: Is the  patient deaf or have difficulty hearing?: Yes Does the patient have difficulty seeing, even when wearing glasses/contacts?: No Does the patient have difficulty concentrating, remembering, or making decisions?: Yes Does the patient have difficulty walking or climbing stairs?: No Does the patient have difficulty dressing or bathing?: No Does the patient have difficulty doing errands alone such as visiting a doctor's office or shopping?: No    Assessment & Plan  1. GAD (generalized anxiety disorder)   2. Dysthymia  She cannot work at this time, very stressed and unable to focus, crying . We will fill FMLA forms   3. Right calf pain  - VAS [place] LOWER EXTREMITY VENOUS (DVT); Future  4. Chronic deep vein thrombosis (DVT) of distal vein of left lower extremity (HCC)  - VAS [place] LOWER EXTREMITY VENOUS (DVT); Future

## 2020-04-19 NOTE — Progress Notes (Deleted)
[facility]  [address], [address] [place], [place] [address] Phone: [phone]  Fax: [phone]   Office Visit:  04/19/2020  Referring physician: [name], MD  Chief Complaint: Tammy Garza is a 39 y.o. female with a DVT of the left lower extremity and protein C deficiency on Eliquis who is seen for 1 week assessment.   HPI: The patient was last seen in the hematology clinic on 04/15/2020. At that time, she felt "alright".  She was recovering well from her plantar fasciitis surgery.  She was having insurance coverage issues for Eliquis.  Hematocrit was 40.5, hemoglobin 13.1, platelets 220,000, WBC 5,900. Calcium was 8.7. She continued Eliquis.  The patient sent a letter via MyChart that stated that her insurance will no longer cover Eliquis starting on 05/15/2020. They will cover warfarin or Xarelto.  The patient contacted Dr. Tasia Catchings on 04/17/2020 and reported worsening right lower extremity calf pain. She was worried about a blood clot. She admitted to missing a few doses of Eliquis the week prior. She was instructed to go to the ER or Urgent Care.  Right lower extremity duplex on 04/19/2020 revealed no evidence of DVT in the right lower extremity.  During the interim, ***   Past Medical History:  Diagnosis Date  . Anemia    h/o  . BRCA negative 01/2020   MyRisk neg except AXIN2 and RPS20 VUS  . Cervical high risk human papillomavirus (HPV) DNA test positive   . Chronic gastritis   . Depression   . DVT (deep venous thrombosis) ([place]) 09/2016   left leg  . Family history of ovarian cancer   . GERD (gastroesophageal reflux disease)   . H. pylori infection   . History of attempted suicide   . History of gastric polyp   . History of kidney stones    h/o  . Hyperhidrosis   . Increased risk of breast cancer 01/2020   IBIS=20.5%  . Migraines   . Protein C deficiency ([place])   . Rash   . Shift work sleep disorder     Past Surgical History:   Procedure Laterality Date  . ABDOMINAL HYSTERECTOMY    . ANTERIOR CRUCIATE LIGAMENT REPAIR Left   . BREAST BIOPSY Left 10/07/2019   left breast calcs x clip  . CHOLECYSTECTOMY    . CYSTOSCOPY  12/19/2016   Procedure: CYSTOSCOPY;  Surgeon: Gae Dry, MD;  Location: [facility] ORS;  Service: Gynecology;;  . ESOPHAGOGASTRODUODENOSCOPY (EGD) WITH PROPOFOL N/A 09/25/2017   Procedure: ESOPHAGOGASTRODUODENOSCOPY (EGD) WITH PROPOFOL;  Surgeon: Jonathon Bellows, MD;  Location: [facility] ENDOSCOPY;  Service: Gastroenterology;  Laterality: N/A;  . LAPAROSCOPIC HYSTERECTOMY Bilateral 12/19/2016   Procedure: HYSTERECTOMY TOTAL LAPAROSCOPIC BILATERAL SALPINGECTOMY;  Surgeon: Gae Dry, MD;  Location: [facility] ORS;  Service: Gynecology;  Laterality: Bilateral;  . PLANTAR FASCIA SURGERY  06/2019  . RECTAL EXAM UNDER ANESTHESIA N/A 02/26/2019   Procedure: RECTAL EXAM UNDER ANESTHESIA, EXCISION OF ANAL SKIN TAG;  Surgeon: Fredirick Maudlin, MD;  Location: [facility] ORS;  Service: General;  Laterality: N/A;  . TUBAL LIGATION      Family History  Problem Relation Age of Onset  . HIV Sister   . Ovarian cancer Sister 76  . Asthma Daughter   . Protein C deficiency Daughter   . Brain cancer Daughter   . Cancer Daughter   . Hypertension Maternal Grandmother   . Hypertension Mother   . Obesity Mother   . Cancer Father 25  lung cancer  . Deep vein thrombosis Father   . Protein C deficiency Father   . COPD Father   . Congestive Heart Failure Father   . Diabetes Brother   . COPD Paternal Grandmother   . Deep vein thrombosis Paternal Grandmother   . Protein C deficiency Daughter   . Breast cancer Neg Hx     Social History:  reports that she quit smoking about 12 years ago. Her smoking use included cigarettes. She started smoking about 17 years ago. She has a 1.25 pack-year smoking history. She has never used smokeless tobacco. She reports current alcohol use of about 1.0 standard drink of alcohol per week. She  reports current drug use. Drug: Marijuana. 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 She wears a mask at work. She has 2 daughters, one was recently diagnosed with glioblastoma. The patient is alone ***today.   Allergies:  Allergies  Allergen Reactions  . Topamax [Topiramate] Other (See Comments)    Confusion , slurred speech   . Effexor [Venlafaxine]     Gas and bloating     Current Medications: Current Outpatient Medications  Medication Sig Dispense Refill  . BD PEN NEEDLE NANO 2ND GEN 32G X 4 MM MISC     . Black Cohosh 540 MG CAPS Take by mouth.    . Boric Acid CRYS Place 600 mg vaginally 2 (two) times a week. 500 g 5  . clotrimazole-betamethasone (LOTRISONE) cream Apply 1 application topically 2 (two) times daily. 30 g 0  . ELIQUIS 5 MG TABS tablet TAKE 1 TABLET BY MOUTH TWICE A DAY 60 tablet 1  . PARoxetine (PAXIL-CR) 12.5 MG 24 hr tablet TAKE 1 TABLET BY MOUTH EVERY DAY 90 tablet 1  . Rimegepant Sulfate (NURTEC) 75 MG TBDP Take 1 tablet by mouth daily as needed. May repeat in 2 hours, no more than 2 in 24 hours for migraines 8 tablet 1  . SAXENDA 18 MG/3ML SOPN Inject 3 mg into the skin daily. 15 mL 1  . Vitamin D, Ergocalciferol, (DRISDOL) 1.25 MG (50000 UNIT) CAPS capsule Take 50,000 Units by mouth every 7 (seven) days.     No current facility-administered medications for this visit.     Review of Systems  Constitutional: Positive for weight loss (5 lbs). Negative for chills, diaphoresis, fever and malaise/fatigue.  HENT: Negative.  Negative for congestion, ear discharge, ear pain, hearing loss, nosebleeds, sinus pain, sore throat and tinnitus.   Eyes: Negative.  Negative for blurred vision and double vision.  Respiratory: Negative.  Negative for cough, hemoptysis, sputum production and shortness  of breath.   Cardiovascular: Negative for chest pain, palpitations and leg swelling.  Gastrointestinal: Negative.  Negative for abdominal pain, blood in stool, constipation, diarrhea, heartburn, melena, nausea and vomiting.  Genitourinary: Negative.  Negative for dysuria, frequency, hematuria and urgency.  Musculoskeletal: Negative.  Negative for back pain, joint pain, myalgias and neck pain.  Skin: Negative.  Negative for itching and rash.  Neurological: Negative for dizziness, tingling, sensory change, speech change, focal weakness, weakness and headaches.  Endo/Heme/Allergies: Negative.  Does not bruise/bleed easily.  Psychiatric/Behavioral: Negative.  Negative for depression and memory loss. The patient is not nervous/anxious and does not have insomnia.   All other systems reviewed and are negative.   Performance status (ECOG):  0***  Vital Signs: Last menstrual period 01/04/2016.   Physical Exam Vitals and nursing note reviewed.  Constitutional:      General: She is not in acute distress.    Appearance: She is well-developed. She is not diaphoretic.  HENT:     Head: Normocephalic and atraumatic.     Mouth/Throat:     Mouth: Mucous membranes are moist.     Pharynx: Oropharynx is clear.  Eyes:     General: No scleral icterus.       Right eye: No discharge.        Left eye: No discharge.     Extraocular Movements: Extraocular movements intact.     Conjunctiva/sclera: Conjunctivae normal.     Pupils: Pupils are equal, round, and reactive to light.     Comments: Brown eyes.  Neck:     Vascular: No JVD.  Cardiovascular:     Rate and Rhythm: Normal rate and regular rhythm.     Heart sounds: Normal heart sounds. No murmur heard.  No friction rub. No gallop.   Pulmonary:     Effort: Pulmonary effort is normal. No respiratory distress.     Breath sounds: Normal breath sounds. No wheezing or rales.  Chest:     Chest wall: No tenderness.  Abdominal:     General: Bowel sounds  are normal. There is no distension.     Palpations: Abdomen is soft. There is no mass.     Tenderness: There is no abdominal tenderness. There is no guarding or rebound.  Musculoskeletal:        General: No tenderness. Normal range of motion.     Cervical back: Normal range of motion and neck supple.  Lymphadenopathy:     Head:     Right side of head: No preauricular, posterior auricular or occipital adenopathy.     Left side of head: No preauricular, posterior auricular or occipital adenopathy.     Cervical: No cervical adenopathy.     Upper Body:     Right upper body: No supraclavicular or axillary adenopathy.     Left upper body: No supraclavicular or axillary adenopathy.  Skin:    General: Skin is warm and dry.     Coloration: Skin is not pale.     Findings: No erythema or rash.  Neurological:     Mental Status: She is alert and oriented to person, place, and time.  Psychiatric:        Behavior: Behavior normal.        Thought Content: Thought content normal.        Judgment: Judgment normal.    No visits with results within 3 Day(s) from this visit.  Latest known visit with results is:  Appointment on 04/15/2020  Component Date Value Ref Range Status  . Sodium 04/15/2020 136  135 - 145 mmol/L Final  . Potassium 04/15/2020 4.1  3.5 - 5.1 mmol/L Final  . Chloride 04/15/2020 102  98 - 111 mmol/L Final  . CO2 04/15/2020 26  22 - 32 mmol/L Final  . Glucose, Bld 04/15/2020 99  70 - 99 mg/dL Final   Glucose reference range applies only to samples taken after fasting for at least 8 hours.  . BUN 04/15/2020 12  6 - 20 mg/dL Final  . Creatinine, Ser 04/15/2020 0.78  0.44 - 1.00 mg/dL Final  . Calcium 04/15/2020 8.7* 8.9 - 10.3 mg/dL Final  . Total Protein 04/15/2020 7.8  6.5 - 8.1 g/dL Final  .  Albumin 04/15/2020 4.4  3.5 - 5.0 g/dL Final  . AST 04/15/2020 17  15 - 41 U/L Final  . ALT 04/15/2020 14  0 - 44 U/L Final  . Alkaline Phosphatase 04/15/2020 43  38 - 126 U/L Final  .  Total Bilirubin 04/15/2020 0.9  0.3 - 1.2 mg/dL Final  . GFR, Estimated 04/15/2020 >60  >60 mL/min Final   Comment: (NOTE) Calculated using the CKD-EPI Creatinine Equation (2021)   . Anion gap 04/15/2020 8  5 - 15 Final   Performed at [facility], [address] [place], [place] [address]  . WBC 04/15/2020 5.9  4.0 - 10.5 K/uL Final  . RBC 04/15/2020 4.31  3.87 - 5.11 MIL/uL Final  . Hemoglobin 04/15/2020 13.1  12.0 - 15.0 g/dL Final  . HCT 04/15/2020 40.5  36 - 46 % Final  . MCV 04/15/2020 94.0  80.0 - 100.0 fL Final  . MCH 04/15/2020 30.4  26.0 - 34.0 pg Final  . MCHC 04/15/2020 32.3  30.0 - 36.0 g/dL Final  . RDW 04/15/2020 12.4  11.5 - 15.5 % Final  . Platelets 04/15/2020 220  150 - 400 K/uL Final  . nRBC 04/15/2020 0.0  0.0 - 0.2 % Final  . Neutrophils Relative % 04/15/2020 38  % Final  . Neutro Abs 04/15/2020 2.3  1.7 - 7.7 K/uL Final  . Lymphocytes Relative 04/15/2020 46  % Final  . Lymphs Abs 04/15/2020 2.8  0.7 - 4.0 K/uL Final  . Monocytes Relative 04/15/2020 12  % Final  . Monocytes Absolute 04/15/2020 0.7  0.1 - 1.0 K/uL Final  . Eosinophils Relative 04/15/2020 3  % Final  . Eosinophils Absolute 04/15/2020 0.2  0.0 - 0.5 K/uL Final  . Basophils Relative 04/15/2020 1  % Final  . Basophils Absolute 04/15/2020 0.1  0.0 - 0.1 K/uL Final  . Immature Granulocytes 04/15/2020 0  % Final  . Abs Immature Granulocytes 04/15/2020 0.01  0.00 - 0.07 K/uL Final   Performed at [facility], [address], [place], [place] [address]    Assessment:  Jaleya Pebley is a 39 y.o. female with protein C deficiencyand a left lower extremity DVTfollowing a 2 1/2 hour flight. She had been on DepoProvera x 1 year. She has a family history of thrombosis.  Left lower extremity duplexon 09/17/2016 revealed an acute appearing thrombus in the calf veins of the left lower extremity (posterior tibial and peroneal veins). Bilateral lower extremity duplexon 11/20/2016  revealed no evidence of acute DVT within either lower extremity. There was some residual nonocclusive thrombus in the left peroneal vein, sequela of previous DVT on the prior scan. Left lower extremity duplexon 07/05/2017 revealed an isolated thrombus of the left peroneal vein in the calf similar to 11/20/2016. There was no acute thrombus. She is on Eliquis.   Hypercoagulable work-up on 05/15/2018and 07/19/2018revealed the following negative studies: Factor V Leiden, prothrombin gene mutation, lupus anticoagulant panel, anticardiolipin antibodies, beta2-glycoprotein, protein S antigen/activity, and ATIII antigen/activity. She has protein C deficiency. Labs on 11/30/2016 revealed a protein C total of 40% (60 - 150%) and protein C activity of 49% (73-180%). Repeat testing on 03/29/2017 revealed a protein C total of 51% and a protein C activity of 51%.  She has a monoclonal gammopathy of unknown significance. SPEP on 09/26/2016 was negative. Immunofixation revealed an IgA monoclonal protein with lambda light chain specificity. Free light chain assay was normal. IgG was 698 ([phone]). IgA was 182 (87-352). 24 hour  UPEP on 10/22/2016 revealed no monoclonal protein. SPEPon 03/29/2017, 10/05/2017, 10/08/2018, 01/15/2019, and 01/15/2020 were negative.Free light chain ratiowas normal on 09/26/2016 and 10/05/2017.  Chest, abdomen, and pelvic CTon 12/08/2016 revealed no lymphadenopathy or hepatosplenomegaly.  She underwent total laparoscopic hysterectomywith bilateral salpingectomy on 12/19/2016. She was off Eliquis x 3 days (2 days pre-op and day of surgery). She developed a small superficial vein thrombus.  She underwent ACL reconstruction with autograft on 05/25/2017. Left lower extremity duplexon 07/05/2017 revealed an isolated thrombus of the left peroneal vein in the calf. As compared to 11/20/2016, the thrombus within the left peroneal vein appeared similar. There was no  acute thrombus.  LLE venous doppler ultrasound on 12/04/2018 revealed no evidence of acute or chronic DVT within the LLE.  Digital screening bilateral mammogram on 09/18/2019 revealed calcifications in the left breast.  Digital diagnostic left mammogram on 10/01/2019 revealed a new 9 mm group of new amorphous calcifications. Left breast stereotactic core needle biopsy on 10/07/2019 revealed  benign breast tissue with cystic papillary apocrine metaplasia; negative for atypia and malignancy. No calcifications were identified. The absence of calcifications may refelect calcifications not surviving processing. Correlation with radiographic impression required.  She received the [place] COVID-19 vaccine on 02/19/2020 and 03/11/2020.  She tested positive for COVID-19 on 12/30/2019.  Symptomatically, ***  Plan: 1.   Review labs ***   2.   Protein C deficiencyand left lower extremity DVT Clinically, she is doing well.             Continue Eliquis. 3.   Monoclonal gammopathy of unknown significance M-spike was 0 on 01/15/2019.  Immunofixation revealed an IgA monoclonal protein with lambda light chain specificity on 10/06/2016.   Significance unclear.  Patient remains asymptomatic.  She denies any B symptoms, issues with infections or bone pain.  Check myeloma panel in 01/2020.   If immunofixation negative, then further follow-up based on symptoms or concerning lab abnormalities. 4.   Hypocalcemia             Calcium 8.6.  Discussed calcium supplementation. 5.   Left breast calcifications  Discuss interval mammogram and biopsy.  Concern raised for lack of calcifications in biopsy.  Present at breast cancer tumor board on 10/27/2019.  Pathology was concordant with imaging per Dr Curlene Dolphin.  RN:  Talk to Elesa Massed re: coverage Eliquis. RN:  Follow-up with patient to ensure Eliquis has been covered. RTC in 6 months for MD assess and labs (CBC with diff, CMP).  I  discussed the assessment and treatment plan with the patient.The patient was provided an opportunity to ask questions and all were answered. The patient agreed with the plan and demonstrated an understanding of the instructions. The patient was advised to call back or seek an in person evaluation if the symptoms worsen or if the condition fails to improve as anticipated.  I provided *** minutes of face-to-face time during this this encounter and > 50% was spent counseling as documented under my assessment and plan.  Nolon Stalls, MD, PhD  04/19/2020, 2:47 PM  I, Mirian Mo Tufford, am acting as a Education administrator for Lequita Asal, MD.   I, [place] Mike Gip, MD, have reviewed the above documentation for accuracy and completeness, and I agree with the above.

## 2020-04-19 NOTE — Telephone Encounter (Signed)
Patient scheduled 12/7 @ 245 pm

## 2020-04-20 ENCOUNTER — Inpatient Hospital Stay: Payer: 59 | Admitting: Hematology and Oncology

## 2020-04-26 ENCOUNTER — Encounter: Payer: Self-pay | Admitting: Family Medicine

## 2020-04-27 ENCOUNTER — Other Ambulatory Visit: Payer: Self-pay | Admitting: Hematology and Oncology

## 2020-04-27 ENCOUNTER — Other Ambulatory Visit: Payer: Self-pay | Admitting: Family Medicine

## 2020-04-27 DIAGNOSIS — E663 Overweight: Secondary | ICD-10-CM

## 2020-05-05 ENCOUNTER — Encounter: Payer: Self-pay | Admitting: Neurology

## 2020-05-05 ENCOUNTER — Ambulatory Visit (INDEPENDENT_AMBULATORY_CARE_PROVIDER_SITE_OTHER): Payer: 59 | Admitting: Neurology

## 2020-05-05 VITALS — BP 126/71 | HR 68 | Ht 66.0 in | Wt 178.8 lb

## 2020-05-05 DIAGNOSIS — G43109 Migraine with aura, not intractable, without status migrainosus: Secondary | ICD-10-CM | POA: Diagnosis not present

## 2020-05-05 DIAGNOSIS — G43709 Chronic migraine without aura, not intractable, without status migrainosus: Secondary | ICD-10-CM

## 2020-05-05 MED ORDER — AJOVY 225 MG/1.5ML ~~LOC~~ SOAJ: 225.0000 mg | SUBCUTANEOUS | 3 refills | Status: DC

## 2020-05-05 NOTE — Progress Notes (Signed)
[facility]    Provider:  Dr Jaynee Eagles Requesting Provider: [name], MD Primary Care Provider:  [name], MD  CC:  Migraines  HPI:  Tammy Garza is a 39 y.o. female here as requested by [name], MD for migraines.  She has a past medical history of protein C deficiency, MGUS, DVT, vitamin D deficiency, migraine with aura, shiftwork sleep disorder.  I reviewed Dr. Freddy Finner notes: She is to see Dr. Domingo Cocking but because he missed glioblastoma on her youngest daughter she would like to see someone else.  She is taking gabapentin since 2018, episodes down to about 3 times per month, she has associated nausea, photophobia and phonophobia, pain is described as temporal throbbing-like sometimes nuchal area, she has intermittent numbness on 1 side of her body sometimes severe nausea.  She is here alone and reports she has had them since a teen. Started right after menses. Mother, sister have migraines. She will have some with auras, spotting, she may also go numb in arms, she has photo/phono/osmophobia, change of weather can trigger and worsen her migraines, nausea, vomiting, pulsating/pounding/throbbing, jaws are sight, neck gets tight, they start in the temples unilaterally and then progress to the other side, movement makes them worse, she wants to go in a dark room. She has 19 headache days a month, at lest 9 of them are migraines that can be moderately severe to severe. She does not always get an aura. She has taken Gabapentin in the past, compazine, baclofen. No other focal neurologic deficits, associated symptoms, inciting events or modifiable factors. Not positional. No changes in quality. Not exertional.   From a review of records, meds tried include: Baclofen, nerve blocks, compazine, topamax, gabapentin, amiytroiptyline, rizatriptan, baclofen, compazine, tylenol, ibuprofen(can't take now), thorazine, flexeril, decadron, nurtec. Ketorolac inj, medrol dosepak,  zofran, paxil, phenergan, effexor, trazodone, zonisa,mide.   Reviewed notes, labs and imaging from outside physicians, which showed: MRI brain 04/18/2018:  FINDINGS: Brain: No acute infarction, hemorrhage, hydrocephalus, extra-axial collection or mass lesion. Complete corpus callosum and vermis. Morphologically normal pituitary gland. No cerebellar tonsillar ectopia. No structural or signal abnormality of brain identified.  Vascular: Normal flow voids.  Skull and upper cervical spine: Normal marrow signal.  Sinuses/Orbits: Negative.  Other: None.  IMPRESSION: No acute abnormality identified.  Unremarkable MRI of the brain.  Review of Systems: Patient complains of symptoms per HPI as well as the following symptoms: migraines. Pertinent negatives and positives per HPI. All others negative.   Social History   Socioeconomic History  . Marital status: Divorced    Spouse name: Not on file  . Number of children: 2  . Years of education: 3  . Highest education level: Associate degree: occupational, Hotel manager, or vocational program  Occupational History  . Not on file  Tobacco Use  . Smoking status: Former Smoker    Packs/day: 0.25    Years: 5.00    Pack years: 1.25    Types: Cigarettes    Start date: 05/15/2002    Quit date: 05/16/2007    Years since quitting: 12.9  . Smokeless tobacco: Never Used  Vaping Use  . Vaping Use: Never used  Substance and Sexual Activity  . Alcohol use: Yes    Alcohol/week: 1.0 standard drink    Types: 1 Glasses of wine per week    Comment: occ wine none last 24 hrs  . Drug use: Not Currently    Types: Marijuana  . Sexual activity: Yes    Partners: Male  Birth control/protection: Surgical    Comment: hysterectomy   Other Topics Concern  . Not on file  Social History Narrative   Separated since June 2016 - she has two children at home . The youngest is his daughter. Working at [employer] in [place].    Dating Gwyndolyn Saxon since 2015    R handed   Glass of tea everyday about 6 oz    Social Determinants of Health   Financial Resource Strain: Low Risk   . Difficulty of Paying Living Expenses: Not hard at all  Food Insecurity: No Food Insecurity  . Worried About Charity fundraiser in the Last Year: Never true  . Ran Out of Food in the Last Year: Never true  Transportation Needs: No Transportation Needs  . Lack of Transportation (Medical): No  . Lack of Transportation (Non-Medical): No  Physical Activity: Insufficiently Active  . Days of Exercise per Week: 1 day  . Minutes of Exercise per Session: 10 min  Stress: Stress Concern Present  . Feeling of Stress : To some extent  Social Connections: Moderately Isolated  . Frequency of Communication with Friends and Family: More than three times a week  . Frequency of Social Gatherings with Friends and Family: Once a week  . Attends Religious Services: Never  . Active Member of Clubs or Organizations: Yes  . Attends Archivist Meetings: 1 to 4 times per year  . Marital Status: Divorced  Human resources officer Violence: Not At Risk  . Fear of Current or Ex-Partner: No  . Emotionally Abused: No  . Physically Abused: No  . Sexually Abused: No    Family History  Problem Relation Age of Onset  . HIV Sister   . Ovarian cancer Sister 20  . Migraines Sister   . Asthma Daughter   . Protein C deficiency Daughter   . Brain cancer Daughter   . Cancer Daughter   . Migraines Daughter   . Hypertension Maternal Grandmother   . Hypertension Mother   . Obesity Mother   . Migraines Mother   . Cancer Father 61       lung cancer  . Deep vein thrombosis Father   . Protein C deficiency Father   . COPD Father   . Congestive Heart Failure Father   . Diabetes Brother   . COPD Paternal Grandmother   . Deep vein thrombosis Paternal Grandmother   . Protein C deficiency Daughter   . Migraines Daughter   . Breast cancer Neg Hx     Past Medical History:  Diagnosis Date  .  Anemia    h/o  . BRCA negative 01/2020   MyRisk neg except AXIN2 and RPS20 VUS  . Cervical high risk human papillomavirus (HPV) DNA test positive   . Chronic gastritis   . Depression   . DVT (deep venous thrombosis) ([place]) 09/2016   left leg  . Family history of ovarian cancer   . GERD (gastroesophageal reflux disease)   . H. pylori infection   . History of attempted suicide   . History of gastric polyp   . History of kidney stones    h/o  . Hyperhidrosis   . Increased risk of breast cancer 01/2020   IBIS=20.5%  . Migraines   . Protein C deficiency ([place])   . Rash   . Shift work sleep disorder     Patient Active Problem List   Diagnosis Date Noted  . Chronic migraine without aura without status migrainosus, not  intractable 05/09/2020  . Family history of ovarian cancer 03/23/2020  . Pelvic mass in female 03/23/2020  . Abnormal mammogram 10/15/2019  . History of hemorrhoidectomy   . Hypocalcemia 10/13/2018  . Protein C deficiency ([place]) 12/10/2016  . Monoclonal gammopathy of unknown significance (MGUS) 11/30/2016  . History of DVT (deep vein thrombosis) 11/02/2016  . Elevated blood protein 09/26/2016  . Chronic deep vein thrombosis (DVT) of distal vein of left lower extremity ([place]) 09/17/2016  . Vitamin D deficiency 07/26/2015  . Thyroid cyst 07/26/2015  . History of Helicobacter pylori infection 04/22/2015  . Migraine with aura and without status migrainosus 04/22/2015  . Shift work sleep disorder 04/22/2015    Past Surgical History:  Procedure Laterality Date  . ABDOMINAL HYSTERECTOMY    . ANTERIOR CRUCIATE LIGAMENT REPAIR Left   . BREAST BIOPSY Left 10/07/2019   left breast calcs x clip  . CHOLECYSTECTOMY    . CYSTOSCOPY  12/19/2016   Procedure: CYSTOSCOPY;  Surgeon: Gae Dry, MD;  Location: [facility] ORS;  Service: Gynecology;;  . ESOPHAGOGASTRODUODENOSCOPY (EGD) WITH PROPOFOL N/A 09/25/2017   Procedure: ESOPHAGOGASTRODUODENOSCOPY (EGD) WITH PROPOFOL;  Surgeon:  Jonathon Bellows, MD;  Location: [facility] ENDOSCOPY;  Service: Gastroenterology;  Laterality: N/A;  . LAPAROSCOPIC HYSTERECTOMY Bilateral 12/19/2016   Procedure: HYSTERECTOMY TOTAL LAPAROSCOPIC BILATERAL SALPINGECTOMY;  Surgeon: Gae Dry, MD;  Location: [facility] ORS;  Service: Gynecology;  Laterality: Bilateral;  . PLANTAR FASCIA SURGERY  06/2019  . RECTAL EXAM UNDER ANESTHESIA N/A 02/26/2019   Procedure: RECTAL EXAM UNDER ANESTHESIA, EXCISION OF ANAL SKIN TAG;  Surgeon: Fredirick Maudlin, MD;  Location: [facility] ORS;  Service: General;  Laterality: N/A;  . TUBAL LIGATION      Current Outpatient Medications  Medication Sig Dispense Refill  . BD PEN NEEDLE NANO 2ND GEN 32G X 4 MM MISC     . Black Cohosh 540 MG CAPS Take by mouth.    . Boric Acid CRYS Place 600 mg vaginally 2 (two) times a week. 500 g 5  . clotrimazole-betamethasone (LOTRISONE) cream Apply 1 application topically 2 (two) times daily. 30 g 0  . ELIQUIS 5 MG TABS tablet TAKE 1 TABLET BY MOUTH TWICE A DAY 60 tablet 1  . PARoxetine (PAXIL-CR) 12.5 MG 24 hr tablet TAKE 1 TABLET BY MOUTH EVERY DAY 90 tablet 1  . Rimegepant Sulfate (NURTEC) 75 MG TBDP Take 1 tablet by mouth daily as needed. May repeat in 2 hours, no more than 2 in 24 hours for migraines 8 tablet 1  . SAXENDA 18 MG/3ML SOPN INJECT 0.5 MLS (3 MG TOTAL) INTO THE SKIN DAILY. 15 mL 0  . Vitamin D, Ergocalciferol, (DRISDOL) 1.25 MG (50000 UNIT) CAPS capsule Take 50,000 Units by mouth every 7 (seven) days.    . Fremanezumab-vfrm (AJOVY) 225 MG/1.5ML SOAJ Inject 225 mg into the skin every 30 (thirty) days. 4.5 mL 3   No current facility-administered medications for this visit.    Allergies as of 05/05/2020 - Review Complete 05/05/2020  Allergen Reaction Noted  . Topamax [topiramate] Other (See Comments) 02/20/2018  . Effexor [venlafaxine]  08/13/2019    Vitals: BP 126/71   Pulse 68   Ht 5' 6"  (1.676 m)   Wt 178 lb 12.8 oz (81.1 kg)   LMP 01/04/2016 Comment: partial  hysterectomy, tubal   BMI 28.86 kg/m  Last Weight:  Wt Readings from Last 1 Encounters:  05/05/20 178 lb 12.8 oz (81.1 kg)   Last Height:   Ht Readings from Last 1 Encounters:  05/05/20 5' 6"  (1.676 m)     Physical exam: Exam: Gen: NAD, conversant, well nourised, overweight, well groomed                     CV: RRR, no MRG. No Carotid Bruits. No peripheral edema, warm, nontender Eyes: Conjunctivae clear without exudates or hemorrhage  Neuro: Detailed Neurologic Exam  Speech:    Speech is normal; fluent and spontaneous with normal comprehension.  Cognition:    The patient is oriented to person, place, and time;     recent and remote memory intact;     language fluent;     normal attention, concentration,     fund of knowledge Cranial Nerves:    The pupils are equal, round, and reactive to light. The fundi are flat. Visual fields are full to finger confrontation. Extraocular movements are intact. Trigeminal sensation is intact and the muscles of mastication are normal. The face is symmetric. The palate elevates in the midline. Hearing intact. Voice is normal. Shoulder shrug is normal. The tongue has normal motion without fasciculations.   Coordination:    No dysmetria or ataxia.   Gait:  normal native gait  Motor Observation:    No asymmetry, no atrophy, and no involuntary movements noted. Tone:    Normal muscle tone.    Posture:    Posture is normal. normal erect    Strength:    Strength is V/V in the upper and lower limbs.      Sensation: intact to LT     Reflex Exam:  DTR's:    Deep tendon reflexes in the upper and lower extremities are normal bilaterally.   Toes:    The toes are downgoing bilaterally.   Clonus:    Clonus is absent.    Assessment/Plan:  Patient with chronic migraines failed multipel medictions and classes.  Preventative: Start Ajovy: Discussed teratogenicity, do not get pregnant until medication stopped for > 6 months Preventative:  Nurtec No indication for imaging currently but if no improvement we can consider  Orders Placed This Encounter  Procedures  . CBC with Differential/Platelets  . Comprehensive metabolic panel  . TSH   Meds ordered this encounter  Medications  . Fremanezumab-vfrm (AJOVY) 225 MG/1.5ML SOAJ    Sig: Inject 225 mg into the skin every 30 (thirty) days.    Dispense:  4.5 mL    Refill:  3    Patient has copay card; she can have medication regardless of insurance approval or copay amount.   Discussed: To prevent or relieve headaches, try the following: Cool Compress. Lie down and place a cool compress on your head.  Avoid headache triggers. If certain foods or odors seem to have triggered your migraines in the past, avoid them. A headache diary might help you identify triggers.  Include physical activity in your daily routine. Try a daily walk or other moderate aerobic exercise.  Manage stress. Find healthy ways to cope with the stressors, such as delegating tasks on your to-do list.  Practice relaxation techniques. Try deep breathing, yoga, massage and visualization.  Eat regularly. Eating regularly scheduled meals and maintaining a healthy diet might help prevent headaches. Also, drink plenty of fluids.  Follow a regular sleep schedule. Sleep deprivation might contribute to headaches Consider biofeedback. With this mind-body technique, you learn to control certain bodily functions -- such as muscle tension, heart rate and blood pressure -- to prevent headaches or reduce headache pain.    Proceed to emergency  room if you experience new or worsening symptoms or symptoms do not resolve, if you have new neurologic symptoms or if headache is severe, or for any concerning symptom.   Provided education and documentation from American headache Society toolbox including articles on: chronic migraine medication overuse headache, chronic migraines, prevention of migraines, behavioral and other  nonpharmacologic treatments for headache.  Cc: [name], MD,  [name], MD  Sarina Ill, MD  [facility] [address] [place], [place] [address]  Phone [phone] Fax [phone]

## 2020-05-05 NOTE — Patient Instructions (Addendum)
ajovy monthly nurtec as needed daily  Rimegepant oral dissolving tablet What is this medicine? RIMEGEPANT (ri ME je pant) is used to treat migraine headaches with or without aura. An aura is a strange feeling or visual disturbance that warns you of an attack. It is not used to prevent migraines. This medicine may be used for other purposes; ask your health care provider or pharmacist if you have questions. COMMON BRAND NAME(S): NURTEC ODT What should I tell my health care provider before I take this medicine? They need to know if you have any of these conditions:  kidney disease  liver disease  an unusual or allergic reaction to rimegepant, other medicines, foods, dyes, or preservatives  pregnant or trying to get pregnant  breast-feeding How should I use this medicine? Take the medicine by mouth. Follow the directions on the prescription label. Leave the tablet in the sealed blister pack until you are ready to take it. With dry hands, open the blister and gently remove the tablet. If the tablet breaks or crumbles, throw it away and take a new tablet out of the blister pack. Place the tablet in the mouth and allow it to dissolve, and then swallow. Do not cut, crush, or chew this medicine. You do not need water to take this medicine. Talk to your pediatrician about the use of this medicine in children. Special care may be needed. Overdosage: If you think you have taken too much of this medicine contact a poison control center or emergency room at once. NOTE: This medicine is only for you. Do not share this medicine with others. What if I miss a dose? This does not apply. This medicine is not for regular use. What may interact with this medicine? This medicine may interact with the following medications:  certain medicines for fungal infections like fluconazole, itraconazole  rifampin This list may not describe all possible interactions. Give your health care provider a list of all the  medicines, herbs, non-prescription drugs, or dietary supplements you use. Also tell them if you smoke, drink alcohol, or use illegal drugs. Some items may interact with your medicine. What should I watch for while using this medicine? Visit your health care professional for regular checks on your progress. Tell your health care professional if your symptoms do not start to get better or if they get worse. What side effects may I notice from receiving this medicine? Side effects that you should report to your doctor or health care professional as soon as possible:  allergic reactions like skin rash, itching or hives; swelling of the face, lips, or tongue Side effects that usually do not require medical attention (report these to your doctor or health care professional if they continue or are bothersome):  nausea This list may not describe all possible side effects. Call your doctor for medical advice about side effects. You may report side effects to FDA at 1-800-FDA-1088. Where should I keep my medicine? Keep out of the reach of children. Store at room temperature between 15 and 30 degrees C (59 and 86 degrees F). Throw away any unused medicine after the expiration date. NOTE: This sheet is a summary. It may not cover all possible information. If you have questions about this medicine, talk to your doctor, pharmacist, or health care provider.  2020 Elsevier/Gold Standard (2018-07-15 00:21:31) Rolanda Lundborg injection What is this medicine? FREMANEZUMAB (fre ma NEZ ue mab) is used to prevent migraine headaches. This medicine may be used for other purposes; ask your  health care provider or pharmacist if you have questions. COMMON BRAND NAME(S): AJOVY What should I tell my health care provider before I take this medicine? They need to know if you have any of these conditions:  an unusual or allergic reaction to fremanezumab, other medicines, foods, dyes, or preservatives  pregnant or trying to  get pregnant  breast-feeding How should I use this medicine? This medicine is for injection under the skin. You will be taught how to prepare and give this medicine. Use exactly as directed. Take your medicine at regular intervals. Do not take your medicine more often than directed. It is important that you put your used needles and syringes in a special sharps container. Do not put them in a trash can. If you do not have a sharps container, call your pharmacist or healthcare provider to get one. Talk to your pediatrician regarding the use of this medicine in children. Special care may be needed. Overdosage: If you think you have taken too much of this medicine contact a poison control center or emergency room at once. NOTE: This medicine is only for you. Do not share this medicine with others. What if I miss a dose? If you miss a dose, take it as soon as you can. If it is almost time for your next dose, take only that dose. Do not take double or extra doses. What may interact with this medicine? Interactions are not expected. This list may not describe all possible interactions. Give your health care provider a list of all the medicines, herbs, non-prescription drugs, or dietary supplements you use. Also tell them if you smoke, drink alcohol, or use illegal drugs. Some items may interact with your medicine. What should I watch for while using this medicine? Tell your doctor or healthcare professional if your symptoms do not start to get better or if they get worse. What side effects may I notice from receiving this medicine? Side effects that you should report to your doctor or health care professional as soon as possible:  allergic reactions like skin rash, itching or hives, swelling of the face, lips, or tongue Side effects that usually do not require medical attention (report these to your doctor or health care professional if they continue or are bothersome):  pain, redness, or irritation  at site where injected This list may not describe all possible side effects. Call your doctor for medical advice about side effects. You may report side effects to FDA at 1-800-FDA-1088. Where should I keep my medicine? Keep out of the reach of children. You will be instructed on how to store this medicine. Throw away any unused medicine after the expiration date on the label. NOTE: This sheet is a summary. It may not cover all possible information. If you have questions about this medicine, talk to your doctor, pharmacist, or health care provider.  2020 Elsevier/Gold Standard (2017-01-29 17:22:56)

## 2020-05-09 ENCOUNTER — Encounter: Payer: Self-pay | Admitting: Neurology

## 2020-05-09 DIAGNOSIS — G43709 Chronic migraine without aura, not intractable, without status migrainosus: Secondary | ICD-10-CM | POA: Insufficient documentation

## 2020-05-11 ENCOUNTER — Telehealth: Payer: Self-pay

## 2020-05-11 NOTE — Telephone Encounter (Signed)
I have submitted a PA request for Ajovy on CMM, Key: [id number] - PA Case ID: [id number].   Awaiting determination from [facility].   Meds tried include: Baclofen, nerve blocks, compazine, topamax, gabapentin, amiytroiptyline, rizatriptan, baclofen, compazine, tylenol, ibuprofen(can't take now), thorazine, flexeril, decadron, nurtec. Ketorolac inj, medrol dosepak, zofran, paxil, phenergan, effexor, trazodone, zonisamide.

## 2020-05-12 NOTE — Telephone Encounter (Signed)
APPROVED  Effective 05/11/20-08/09/20

## 2020-05-22 ENCOUNTER — Other Ambulatory Visit: Payer: Self-pay | Admitting: Family Medicine

## 2020-05-22 DIAGNOSIS — E663 Overweight: Secondary | ICD-10-CM

## 2020-05-25 ENCOUNTER — Ambulatory Visit: Payer: 59

## 2020-05-25 ENCOUNTER — Ambulatory Visit: Payer: 59 | Admitting: Obstetrics & Gynecology

## 2020-05-25 NOTE — Progress Notes (Signed)
Name: Tammy Garza   MRN: [id number]    DOB: [birth year]   Date:05/27/2020       Progress Note  Subjective  Chief Complaint  Follow Up  HPI   Stress: daughter had brain surgery and the day after Thanksgiving she had multiple seizures she went to [facility] and was transferred that day to [facility], 04/10/2020. She had a stroke secondary to bleeding, she is very concerned about her daughter. She is worried about losing her job, she needs FMLA. Her daughter was in  ICU but is finally back at home and getting ready to begin chemotherapy , older daughter was admitted  behavioral health center for one week due to attempting suicide back in May and she also worries about her. She is constantly worried, unable to sleep , cannot focus. She got some financial help through the holidays but is worried about how she will stay out of work. Her daughter needs her for frequent appointments and will need to attend virtual school instead of in person. She is only 41 years old and she worries about leaving her at home due to multiple medical problems. She is on Paxil CR 12.5 mg and we will go up on the dose. We will also try low dose Temazepam to see if helps her sleep at night     Patient Active Problem List   Diagnosis Date Noted  . Chronic migraine without aura without status migrainosus, not intractable 05/09/2020  . Family history of ovarian cancer 03/23/2020  . Pelvic mass in female 03/23/2020  . Abnormal mammogram 10/15/2019  . History of hemorrhoidectomy   . Hypocalcemia 10/13/2018  . Protein C deficiency ([place]) 12/10/2016  . Monoclonal gammopathy of unknown significance (MGUS) 11/30/2016  . History of DVT (deep vein thrombosis) 11/02/2016  . Elevated blood protein 09/26/2016  . Chronic deep vein thrombosis (DVT) of distal vein of left lower extremity ([place]) 09/17/2016  . Vitamin D deficiency 07/26/2015  . Thyroid cyst 07/26/2015  . History of Helicobacter pylori infection 04/22/2015  . Migraine with aura and  without status migrainosus 04/22/2015  . Shift work sleep disorder 04/22/2015    Past Surgical History:  Procedure Laterality Date  . ABDOMINAL HYSTERECTOMY    . ANTERIOR CRUCIATE LIGAMENT REPAIR Left   . BREAST BIOPSY Left 10/07/2019   left breast calcs x clip  . CHOLECYSTECTOMY    . CYSTOSCOPY  12/19/2016   Procedure: CYSTOSCOPY;  Surgeon: Gae Dry, MD;  Location: [facility] ORS;  Service: Gynecology;;  . ESOPHAGOGASTRODUODENOSCOPY (EGD) WITH PROPOFOL N/A 09/25/2017   Procedure: ESOPHAGOGASTRODUODENOSCOPY (EGD) WITH PROPOFOL;  Surgeon: Jonathon Bellows, MD;  Location: [facility] ENDOSCOPY;  Service: Gastroenterology;  Laterality: N/A;  . LAPAROSCOPIC HYSTERECTOMY Bilateral 12/19/2016   Procedure: HYSTERECTOMY TOTAL LAPAROSCOPIC BILATERAL SALPINGECTOMY;  Surgeon: Gae Dry, MD;  Location: [facility] ORS;  Service: Gynecology;  Laterality: Bilateral;  . PLANTAR FASCIA SURGERY  06/2019  . RECTAL EXAM UNDER ANESTHESIA N/A 02/26/2019   Procedure: RECTAL EXAM UNDER ANESTHESIA, EXCISION OF ANAL SKIN TAG;  Surgeon: Fredirick Maudlin, MD;  Location: [facility] ORS;  Service: General;  Laterality: N/A;  . TUBAL LIGATION      Family History  Problem Relation Age of Onset  . HIV Sister   . Ovarian cancer Sister 17  . Migraines Sister   . Asthma Daughter   . Protein C deficiency Daughter   . Brain cancer Daughter   . Cancer Daughter   . Migraines Daughter   . Hypertension Maternal Grandmother   . Hypertension Mother   .  Obesity Mother   . Migraines Mother   . Cancer Father 49       lung cancer  . Deep vein thrombosis Father   . Protein C deficiency Father   . COPD Father   . Congestive Heart Failure Father   . Diabetes Brother   . COPD Paternal Grandmother   . Deep vein thrombosis Paternal Grandmother   . Protein C deficiency Daughter   . Migraines Daughter   . Breast cancer Neg Hx     Social History   Tobacco Use  . Smoking status: Former Smoker    Packs/day: 0.25    Years: 5.00    Pack  years: 1.25    Types: Cigarettes    Start date: 05/15/2002    Quit date: 05/16/2007    Years since quitting: 13.0  . Smokeless tobacco: Never Used  Substance Use Topics  . Alcohol use: Yes    Alcohol/week: 1.0 standard drink    Types: 1 Glasses of wine per week    Comment: occ wine none last 24 hrs     Current Outpatient Medications:  .  temazepam (RESTORIL) 15 MG capsule, Take 1 capsule (15 mg total) by mouth at bedtime as needed for sleep., Disp: 30 capsule, Rfl: 0 .  BD PEN NEEDLE NANO 2ND GEN 32G X 4 MM MISC, , Disp: , Rfl:  .  Black Cohosh 540 MG CAPS, Take by mouth., Disp: , Rfl:  .  Boric Acid CRYS, Place 600 mg vaginally 2 (two) times a week., Disp: 500 g, Rfl: 5 .  clotrimazole-betamethasone (LOTRISONE) cream, Apply 1 application topically 2 (two) times daily., Disp: 30 g, Rfl: 0 .  ELIQUIS 5 MG TABS tablet, TAKE 1 TABLET BY MOUTH TWICE A DAY, Disp: 60 tablet, Rfl: 1 .  Fremanezumab-vfrm (AJOVY) 225 MG/1.5ML SOAJ, Inject 225 mg into the skin every 30 (thirty) days., Disp: 4.5 mL, Rfl: 3 .  PARoxetine (PAXIL-CR) 25 MG 24 hr tablet, Take 1 tablet (25 mg total) by mouth daily., Disp: 90 tablet, Rfl: 0 .  Rimegepant Sulfate (NURTEC) 75 MG TBDP, Take 1 tablet by mouth daily as needed. May repeat in 2 hours, no more than 2 in 24 hours for migraines, Disp: 8 tablet, Rfl: 1 .  SAXENDA 18 MG/3ML SOPN, INJECT 3 MG INTO THE SKIN DAILY., Disp: 15 mL, Rfl: 0 .  Vitamin D, Ergocalciferol, (DRISDOL) 1.25 MG (50000 UNIT) CAPS capsule, Take 50,000 Units by mouth every 7 (seven) days., Disp: , Rfl:   Allergies  Allergen Reactions  . Topamax [Topiramate] Other (See Comments)    Confusion , slurred speech   . Effexor [Venlafaxine]     Gas and bloating     I personally reviewed active problem list, medication list, allergies, family history, social history, health maintenance with the patient/caregiver today.   ROS  Ten systems reviewed and is negative except as mentioned in HPI    Objective  Vitals:   05/27/20 1030  BP: 112/72  Pulse: 73  Resp: 16  Temp: 97.9 F (36.6 C)  SpO2: 95%  Weight: 186 lb 12.8 oz (84.7 kg)  Height: 5\' 6"  (1.676 m)    Body mass index is 30.15 kg/m.  Physical Exam  Constitutional: Patient appears well-developed and well-nourished. Obese  No distress.  HEENT: head atraumatic, normocephalic, pupils equal and reactive to light,  neck supple Cardiovascular: Normal rate, regular rhythm and normal heart sounds.  No murmur heard. No BLE edema. Pulmonary/Chest: Effort normal and breath sounds normal.  No respiratory distress. Abdominal: Soft.  There is no tenderness. Psychiatric: Patient has a normal mood and affect. behavior is normal. Judgment and thought content normal.  Recent Results (from the past 2160 hour(s))  Comprehensive metabolic panel     Status: Abnormal   Collection Time: 04/15/20  8:24 AM  Result Value Ref Range   Sodium 136 135 - 145 mmol/L   Potassium 4.1 3.5 - 5.1 mmol/L   Chloride 102 98 - 111 mmol/L   CO2 26 22 - 32 mmol/L   Glucose, Bld 99 70 - 99 mg/dL    Comment: Glucose reference range applies only to samples taken after fasting for at least 8 hours.   BUN 12 6 - 20 mg/dL   Creatinine, Ser 0.78 0.44 - 1.00 mg/dL   Calcium 8.7 (L) 8.9 - 10.3 mg/dL   Total Protein 7.8 6.5 - 8.1 g/dL   Albumin 4.4 3.5 - 5.0 g/dL   AST 17 15 - 41 U/L   ALT 14 0 - 44 U/L   Alkaline Phosphatase 43 38 - 126 U/L   Total Bilirubin 0.9 0.3 - 1.2 mg/dL   GFR, Estimated >60 >60 mL/min    Comment: (NOTE) Calculated using the CKD-EPI Creatinine Equation (2021)    Anion gap 8 5 - 15    Comment: Performed at [facility], [address], [place], [place] [address]  CBC with Differential     Status: None   Collection Time: 04/15/20  8:24 AM  Result Value Ref Range   WBC 5.9 4.0 - 10.5 K/uL   RBC 4.31 3.87 - 5.11 MIL/uL   Hemoglobin 13.1 12.0 - 15.0 g/dL   HCT 40.5 36.0 - 46.0 %   MCV 94.0 80.0 - 100.0 fL   MCH  30.4 26.0 - 34.0 pg   MCHC 32.3 30.0 - 36.0 g/dL   RDW 12.4 11.5 - 15.5 %   Platelets 220 150 - 400 K/uL   nRBC 0.0 0.0 - 0.2 %   Neutrophils Relative % 38 %   Neutro Abs 2.3 1.7 - 7.7 K/uL   Lymphocytes Relative 46 %   Lymphs Abs 2.8 0.7 - 4.0 K/uL   Monocytes Relative 12 %   Monocytes Absolute 0.7 0.1 - 1.0 K/uL   Eosinophils Relative 3 %   Eosinophils Absolute 0.2 0.0 - 0.5 K/uL   Basophils Relative 1 %   Basophils Absolute 0.1 0.0 - 0.1 K/uL   Immature Granulocytes 0 %   Abs Immature Granulocytes 0.01 0.00 - 0.07 K/uL    Comment: Performed at [facility], [address] [place], [place] [address]    Diabetic Foot Exam:  PHQ2/9: Depression screen [facility] 2/9 05/27/2020 04/19/2020 02/25/2020 02/18/2020 12/22/2019  Decreased Interest 3 3 0 0 1  Down, Depressed, Hopeless 0 1 0 0 0  PHQ - 2 Score 3 4 0 0 1  Altered sleeping 3 3 - 3 3  Tired, decreased energy 2 3 - 0 3  Change in appetite 2 3 - 0 1  Feeling bad or failure about yourself  1 1 - 0 1  Trouble concentrating 2 2 - 0 1  Moving slowly or fidgety/restless 1 3 - 0 1  Suicidal thoughts 0 0 - 0 0  PHQ-9 Score 14 19 - 3 11  Difficult doing work/chores Very difficult Extremely dIfficult - Somewhat difficult Somewhat difficult  Some recent data might be hidden    phq 9 is positive  GAD 7 :  Generalized Anxiety Score 04/19/2020 02/18/2020 08/13/2019 07/02/2019  Nervous, Anxious, on Edge 3 1 2 3   Control/stop worrying 2 0 1 0  Worry too much - different things 3 0 0 0  Trouble relaxing 3 1 1 1   Restless 2 0 1 0  Easily annoyed or irritable 2 0 2 0  Afraid - awful might happen 3 0 0 0  Total GAD 7 Score 18 2 7 4   Anxiety Difficulty Extremely difficult Not difficult at all Very difficult Somewhat difficult     Fall Risk: Fall Risk  05/27/2020 04/19/2020 02/25/2020 02/18/2020 12/22/2019  Falls in the past year? 0 0 0 0 0  Number falls in past yr: 0 0 0 0 0  Injury with Fall? 0 0 0 0 0  Comment - - - - -      Functional Status Survey: Is the patient deaf or have difficulty hearing?: Yes Does the patient have difficulty seeing, even when wearing glasses/contacts?: No Does the patient have difficulty concentrating, remembering, or making decisions?: Yes Does the patient have difficulty walking or climbing stairs?: No Does the patient have difficulty dressing or bathing?: No Does the patient have difficulty doing errands alone such as visiting a doctor's office or shopping?: No    Assessment & Plan  1. GAD (generalized anxiety disorder)  - PARoxetine (PAXIL-CR) 25 MG 24 hr tablet; Take 1 tablet (25 mg total) by mouth daily.  Dispense: 90 tablet; Refill: 0  2. Insomnia, unspecified type  - temazepam (RESTORIL) 15 MG capsule; Take 1 capsule (15 mg total) by mouth at bedtime as needed for sleep.  Dispense: 30 capsule; Refill: 0  3. Dysthymia

## 2020-05-27 ENCOUNTER — Encounter: Payer: Self-pay | Admitting: Family Medicine

## 2020-05-27 ENCOUNTER — Ambulatory Visit: Payer: 59 | Admitting: Family Medicine

## 2020-05-27 ENCOUNTER — Other Ambulatory Visit: Payer: Self-pay

## 2020-05-27 VITALS — BP 112/72 | HR 73 | Temp 97.9°F | Resp 16 | Ht 66.0 in | Wt 186.8 lb

## 2020-05-27 DIAGNOSIS — F341 Dysthymic disorder: Secondary | ICD-10-CM | POA: Diagnosis not present

## 2020-05-27 DIAGNOSIS — G4700 Insomnia, unspecified: Secondary | ICD-10-CM | POA: Diagnosis not present

## 2020-05-27 DIAGNOSIS — F411 Generalized anxiety disorder: Secondary | ICD-10-CM | POA: Diagnosis not present

## 2020-05-27 MED ORDER — TEMAZEPAM 15 MG PO CAPS: 15.0000 mg | ORAL_CAPSULE | Freq: Every evening | ORAL | 0 refills | Status: DC | PRN

## 2020-05-27 MED ORDER — PAROXETINE HCL ER 25 MG PO TB24: 25.0000 mg | ORAL_TABLET | Freq: Every day | ORAL | 0 refills | Status: DC

## 2020-05-31 ENCOUNTER — Ambulatory Visit: Payer: 59

## 2020-05-31 ENCOUNTER — Ambulatory Visit: Payer: 59 | Admitting: Obstetrics & Gynecology

## 2020-06-10 ENCOUNTER — Ambulatory Visit (INDEPENDENT_AMBULATORY_CARE_PROVIDER_SITE_OTHER): Payer: 59 | Admitting: Obstetrics & Gynecology

## 2020-06-10 ENCOUNTER — Other Ambulatory Visit: Payer: Self-pay | Admitting: Obstetrics & Gynecology

## 2020-06-10 ENCOUNTER — Encounter: Payer: Self-pay | Admitting: Obstetrics & Gynecology

## 2020-06-10 ENCOUNTER — Ambulatory Visit (INDEPENDENT_AMBULATORY_CARE_PROVIDER_SITE_OTHER): Payer: 59

## 2020-06-10 ENCOUNTER — Other Ambulatory Visit: Payer: Self-pay

## 2020-06-10 ENCOUNTER — Other Ambulatory Visit: Payer: Self-pay | Admitting: Family Medicine

## 2020-06-10 VITALS — BP 100/70 | Ht 66.0 in | Wt 185.0 lb

## 2020-06-10 DIAGNOSIS — Z8041 Family history of malignant neoplasm of ovary: Secondary | ICD-10-CM

## 2020-06-10 DIAGNOSIS — R1900 Intra-abdominal and pelvic swelling, mass and lump, unspecified site: Secondary | ICD-10-CM

## 2020-06-10 DIAGNOSIS — E559 Vitamin D deficiency, unspecified: Secondary | ICD-10-CM

## 2020-06-10 NOTE — Telephone Encounter (Signed)
Requested medication (s) are due for refill today: historical med   Requested medication (s) are on the active medication list: yes  Last refill:  historical provider  Future visit scheduled: yes   Notes to clinic: historical med, not delegated per protocol, last labs 09/10/19     Requested Prescriptions  Pending Prescriptions Disp Refills   Vitamin D, Ergocalciferol, (DRISDOL) 1.25 MG (50000 UNIT) CAPS capsule [Pharmacy Med Name: VITAMIN D2 1.25MG (50,000 UNIT)] 12 capsule 1    Sig: TAKE 1 CAPSULE (50,000 UNITS TOTAL) BY MOUTH EVERY 7 (SEVEN) DAYS.      Endocrinology:  Vitamins - Vitamin D Supplementation Failed - 06/10/2020  3:42 PM      Failed - 50,000 IU strengths are not delegated      Failed - Ca in normal range and within 360 days    Calcium  Date Value Ref Range Status  04/15/2020 8.7 (L) 8.9 - 10.3 mg/dL Final   Calcium, Total  Date Value Ref Range Status  04/17/2014 7.7 (L) 8.5 - 10.1 mg/dL Final          Failed - Phosphate in normal range and within 360 days    No results found for: PHOS        Failed - Vitamin D in normal range and within 360 days    Vit D, 25-Hydroxy  Date Value Ref Range Status  09/10/2019 13 (L) 30 - 100 ng/mL Final    Comment:    Vitamin D Status         25-OH Vitamin D: . Deficiency:                    <20 ng/mL Insufficiency:             20 - 29 ng/mL Optimal:                 > or = 30 ng/mL . For 25-OH Vitamin D testing on patients on  D2-supplementation and patients for whom quantitation  of D2 and D3 fractions is required, the [employer]ureD(TM) 25-OH VIT D, (D2,D3), LC/MS/MS is recommended: order  code [id number] (patients >16yrs). See Note 1 . Note 1 . For additional information, please refer to  http://education.Quest[facility].com/faq/FAQ199  (This link is being provided for informational/ educational purposes only.)           Passed - Valid encounter within last 12 months    Recent Outpatient Visits           2 weeks  ago GAD (generalized anxiety disorder)   [facility] [name], MD   1 month ago GAD (generalized anxiety disorder)   [facility] [place], Drue Stager, MD   3 months ago Intractable migraine with aura with status migrainosus   [facility] [name], MD   3 months ago Chronic deep vein thrombosis (DVT) of distal vein of left lower extremity [facility])   [facility] [name], MD   5 months ago Acute vaginitis   [facility] [name], MD       Future Appointments             In 1 month [name], MD [facility] [place]   In 3 months [name], MD [place]

## 2020-06-10 NOTE — Progress Notes (Signed)
HPI: Pt has a prior h/o ovarian cystic mass on the right. No recent pain or bloating.  She had opted for surveiallance and recheck, vs surgery, of this cyst 2 mos ago.  She has FH (sis) w ovarian cancer.  Prior TLH. CA125 normal.  Ultrasound demonstrates no masses seen These findings are Pelvis normal  PMHx: She  has a past medical history of Anemia, BRCA negative (01/2020), Cervical high risk human papillomavirus (HPV) DNA test positive, Chronic gastritis, Depression, DVT (deep venous thrombosis) ([place]) (09/2016), Family history of ovarian cancer, GERD (gastroesophageal reflux disease), H. pylori infection, History of attempted suicide, History of gastric polyp, History of kidney stones, Hyperhidrosis, Increased risk of breast cancer (01/2020), Migraines, Protein C deficiency ([place]), Rash, and Shift work sleep disorder. Also,  has a past surgical history that includes Tubal ligation; Cholecystectomy; Laparoscopic hysterectomy (Bilateral, 12/19/2016); Cystoscopy (12/19/2016); Abdominal hysterectomy; Anterior cruciate ligament repair (Left); Esophagogastroduodenoscopy (egd) with propofol (N/A, 09/25/2017); Rectal exam under anesthesia (N/A, 02/26/2019); Plantar fascia surgery (06/2019); and Breast biopsy (Left, 10/07/2019)., family history includes Asthma in her daughter; Brain cancer in her daughter; COPD in her father and paternal grandmother; Cancer in her daughter; Cancer (age of onset: 34) in her father; Congestive Heart Failure in her father; Deep vein thrombosis in her father and paternal grandmother; Diabetes in her brother; HIV in her sister; Hypertension in her maternal grandmother and mother; Migraines in her daughter, daughter, mother, and sister; Obesity in her mother; Ovarian cancer (age of onset: 9) in her sister; Protein C deficiency in her daughter, daughter, and father.,  reports that she quit smoking about 13 years ago. Her smoking use included cigarettes. She started smoking about 18 years ago.  She has a 1.25 pack-year smoking history. She has never used smokeless tobacco. She reports current alcohol use of about 1.0 standard drink of alcohol per week. She reports previous drug use. Drug: Marijuana.  She has a current medication list which includes the following prescription(s): bd pen needle nano 2nd gen, black cohosh, boric acid, clotrimazole-betamethasone, eliquis, ajovy, paroxetine, nurtec, saxenda, temazepam, and vitamin d (ergocalciferol). Also, is allergic to topamax [topiramate] and effexor [venlafaxine].  Review of Systems  All other systems reviewed and are negative.   Objective: BP 100/70   Ht 5' 6"  (1.676 m)   Wt 185 lb (83.9 kg)   LMP 01/04/2016 Comment: partial hysterectomy, tubal   BMI 29.86 kg/m   Physical examination Constitutional NAD, Conversant  Skin No rashes, lesions or ulceration.   Extremities: Moves all appropriately.  Normal ROM for age. No lymphadenopathy.  Neuro: Grossly intact  Psych: Oriented to PPT.  Normal mood. Normal affect.   US PELVIS TRANSVAGINAL NON-OB (TV ONLY)  Result Date: 06/10/2020 Patient Name: Tomeika Weinmann DOB: 04-Dec-1980 MRN: [id number] ULTRASOUND REPORT Location: [facility] Date of Service: 06/10/2020 Indications:Evaluate for a Pelvic Mass Findings: The uterus and cervix have been removed. Right Ovary measures 2.8 x 1.2 x 1.4 cm. It is normal in appearance. Left Ovary measures 2.9 x 2.2 x 2.0 cm. It is normal in appearance. Survey of the adnexa demonstrates no adnexal masses. There is no free fluid in the cul de sac. Impression: 1. Hysterectomy noted. 2. Normal appearing ovaries. Recommendations: 1.Clinical correlation with the patient's History and Physical Exam. Gweneth Dimitri, RT Review of ULTRASOUND.    I have personally reviewed images and report of recent ultrasound done at [facility].    Plan of management to be discussed with patient. Barnett Applebaum, MD, [facility], [facility] 06/10/2020  4:31  PM   Assessment:  Family history of ovarian cancer Prior ovarian cyst, resolved Monitor for s/sx recurrence  A total of 20 minutes were spent face-to-face with the patient as well as preparation, review, communication, and documentation during this encounter.   Barnett Applebaum, MD, [facility], [facility] 06/10/2020  4:38 PM

## 2020-06-11 NOTE — Telephone Encounter (Signed)
This pt does not come to this office

## 2020-06-19 ENCOUNTER — Other Ambulatory Visit: Payer: Self-pay | Admitting: Family Medicine

## 2020-06-19 DIAGNOSIS — E663 Overweight: Secondary | ICD-10-CM

## 2020-06-19 NOTE — Telephone Encounter (Signed)
Future visit in 1 month  

## 2020-06-30 ENCOUNTER — Telehealth: Payer: Self-pay

## 2020-06-30 NOTE — Telephone Encounter (Signed)
Completed PA for eliquis on [email or web address]. waiting for results

## 2020-07-02 ENCOUNTER — Other Ambulatory Visit: Payer: Self-pay | Admitting: Hematology and Oncology

## 2020-07-06 ENCOUNTER — Other Ambulatory Visit: Payer: Self-pay

## 2020-07-08 ENCOUNTER — Telehealth: Payer: Self-pay

## 2020-07-08 NOTE — Telephone Encounter (Signed)
Submitting PA for Eliquis on Cover My meds (Key: [id number]).    This is listed in the PA:  The patient's drug benefit plan provides coverage for other drugs which may be considered for treating your patient. Can your patient's treatment be switched to a formulary drug? [If yes, then provide your patient with a new prescription for the preferred product.] Available Formulary Alternatives: Xarelto  OK to change medication?

## 2020-07-09 MED ORDER — RIVAROXABAN 20 MG PO TABS: 20.0000 mg | ORAL_TABLET | Freq: Every day | ORAL | 1 refills | Status: DC

## 2020-07-22 NOTE — Progress Notes (Signed)
Name: Tammy Garza   MRN: [id number]    DOB: 12-03-1980   Date:07/26/2020       Progress Note  Subjective  Chief Complaint  Follow up  HPI   Dysthymia/Anxiety: She lost her mother to multiple myeloma and also her sister to ovarian cancer - she states when they were ready to go it was easier to accept, however her daughter is now on palliative care and she is not ready to let her go, she feels alone , sad, crying spells, but still holding on to the days she has a better day and is able to enjoy life. Older sister is working and is trying to be supportive - still seeing therapist. Father of her daughter is not present  Chronic DVT, she takes medications as prescribed , Xarelto, she states calf pain resolved   Diarrhea: she states she felt nauseated last night while having a watery stool last night, states still not back to normal, had two episodes of diarrhea this morning, appetite also low. Nobody else is sick at home. Only has cramping prior to bowel movement, no abdominal pain or fever.   Obesity: she just re-started back on Saxenda last week at 0.6 dose and is going to titrate up as tolerated. She is trying to eat healthy, fruit and vegetables, salads, lean meat.   Migraine headaches: had an episode last night, she is under the care of neurologist. On preventive and prn medication   Patient Active Problem List   Diagnosis Date Noted  . Chronic migraine without aura without status migrainosus, not intractable 05/09/2020  . Family history of ovarian cancer 03/23/2020  . Pelvic mass in female 03/23/2020  . Abnormal mammogram 10/15/2019  . History of hemorrhoidectomy   . Hypocalcemia 10/13/2018  . Protein C deficiency ([place]) 12/10/2016  . Monoclonal gammopathy of unknown significance (MGUS) 11/30/2016  . History of DVT (deep vein thrombosis) 11/02/2016  . Elevated blood protein 09/26/2016  . Chronic deep vein thrombosis (DVT) of distal vein of left lower extremity ([place]) 09/17/2016  .  Vitamin D deficiency 07/26/2015  . Thyroid cyst 07/26/2015  . History of Helicobacter pylori infection 04/22/2015  . Migraine with aura and without status migrainosus 04/22/2015  . Shift work sleep disorder 04/22/2015    Past Surgical History:  Procedure Laterality Date  . ABDOMINAL HYSTERECTOMY    . ANTERIOR CRUCIATE LIGAMENT REPAIR Left   . BREAST BIOPSY Left 10/07/2019   left breast calcs x clip  . CHOLECYSTECTOMY    . CYSTOSCOPY  12/19/2016   Procedure: CYSTOSCOPY;  Surgeon: Gae Dry, MD;  Location: [facility] ORS;  Service: Gynecology;;  . ESOPHAGOGASTRODUODENOSCOPY (EGD) WITH PROPOFOL N/A 09/25/2017   Procedure: ESOPHAGOGASTRODUODENOSCOPY (EGD) WITH PROPOFOL;  Surgeon: Jonathon Bellows, MD;  Location: [facility] ENDOSCOPY;  Service: Gastroenterology;  Laterality: N/A;  . LAPAROSCOPIC HYSTERECTOMY Bilateral 12/19/2016   Procedure: HYSTERECTOMY TOTAL LAPAROSCOPIC BILATERAL SALPINGECTOMY;  Surgeon: Gae Dry, MD;  Location: [facility] ORS;  Service: Gynecology;  Laterality: Bilateral;  . PLANTAR FASCIA SURGERY  06/2019  . RECTAL EXAM UNDER ANESTHESIA N/A 02/26/2019   Procedure: RECTAL EXAM UNDER ANESTHESIA, EXCISION OF ANAL SKIN TAG;  Surgeon: Fredirick Maudlin, MD;  Location: [facility] ORS;  Service: General;  Laterality: N/A;  . TUBAL LIGATION      Family History  Problem Relation Age of Onset  . HIV Sister   . Ovarian cancer Sister 17  . Migraines Sister   . Asthma Daughter   . Protein C deficiency Daughter   . Brain cancer  Daughter   . Cancer Daughter   . Migraines Daughter   . Hypertension Maternal Grandmother   . Hypertension Mother   . Obesity Mother   . Migraines Mother   . Cancer Father 15       lung cancer  . Deep vein thrombosis Father   . Protein C deficiency Father   . COPD Father   . Congestive Heart Failure Father   . Diabetes Brother   . COPD Paternal Grandmother   . Deep vein thrombosis Paternal Grandmother   . Protein C deficiency Daughter   . Migraines Daughter    . Breast cancer Neg Hx     Social History   Tobacco Use  . Smoking status: Former Smoker    Packs/day: 0.25    Years: 5.00    Pack years: 1.25    Types: Cigarettes    Start date: 05/15/2002    Quit date: 05/16/2007    Years since quitting: 13.2  . Smokeless tobacco: Never Used  Substance Use Topics  . Alcohol use: Yes    Alcohol/week: 1.0 standard drink    Types: 1 Glasses of wine per week    Comment: occ wine none last 24 hrs     Current Outpatient Medications:  .  BD PEN NEEDLE NANO 2ND GEN 32G X 4 MM MISC, , Disp: , Rfl:  .  Boric Acid CRYS, Place 600 mg vaginally 2 (two) times a week., Disp: 500 g, Rfl: 5 .  clotrimazole-betamethasone (LOTRISONE) cream, Apply 1 application topically 2 (two) times daily., Disp: 30 g, Rfl: 0 .  Fremanezumab-vfrm (AJOVY) 225 MG/1.5ML SOAJ, Inject 225 mg into the skin every 30 (thirty) days., Disp: 4.5 mL, Rfl: 3 .  PARoxetine (PAXIL-CR) 25 MG 24 hr tablet, Take 1 tablet (25 mg total) by mouth daily., Disp: 90 tablet, Rfl: 0 .  Rimegepant Sulfate (NURTEC) 75 MG TBDP, Take 1 tablet by mouth daily as needed. May repeat in 2 hours, no more than 2 in 24 hours for migraines, Disp: 8 tablet, Rfl: 1 .  rivaroxaban (XARELTO) 20 MG TABS tablet, Take 1 tablet (20 mg total) by mouth daily., Disp: 30 tablet, Rfl: 1 .  SAXENDA 18 MG/3ML SOPN, INJECT 3 MG INTO THE SKIN DAILY., Disp: 9 mL, Rfl: 0 .  temazepam (RESTORIL) 15 MG capsule, Take 1 capsule (15 mg total) by mouth at bedtime as needed for sleep., Disp: 30 capsule, Rfl: 0 .  Vitamin D, Ergocalciferol, (DRISDOL) 1.25 MG (50000 UNIT) CAPS capsule, TAKE 1 CAPSULE (50,000 UNITS TOTAL) BY MOUTH EVERY 7 (SEVEN) DAYS., Disp: 12 capsule, Rfl: 0 .  fluconazole (DIFLUCAN) 150 MG tablet, Take by mouth. (Patient not taking: Reported on 07/26/2020), Disp: , Rfl:   Allergies  Allergen Reactions  . Topamax [Topiramate] Other (See Comments)    Confusion , slurred speech   . Effexor [Venlafaxine]     Gas and bloating      I personally reviewed active problem list, medication list, allergies, family history, social history, health maintenance with the patient/caregiver today.   ROS  Ten systems reviewed and is negative except as mentioned in HPI  Objective  Vitals:   07/26/20 1506  BP: 120/76  Pulse: 69  Resp: 16  Temp: 98.4 F (36.9 C)  TempSrc: Oral  SpO2: 99%  Weight: 176 lb (79.8 kg)  Height: 5' 6"  (1.676 m)    Body mass index is 28.41 kg/m.  Physical Exam  Constitutional: Patient appears well-developed and well-nourished. Overweight.  No distress.  HEENT: head atraumatic, normocephalic, pupils equal and reactive to light,  neck supple Cardiovascular: Normal rate, regular rhythm and normal heart sounds.  No murmur heard. No BLE edema. Pulmonary/Chest: Effort normal and breath sounds normal. No respiratory distress. Abdominal: Soft.  There is no tenderness. Psychiatric: Patient has a normal mood and affect. behavior is normal. Judgment and thought content normal.  PHQ2/9: Depression screen [facility] 2/9 07/26/2020 05/27/2020 04/19/2020 02/25/2020 02/18/2020  Decreased Interest 3 3 3  0 0  Down, Depressed, Hopeless 2 0 1 0 0  PHQ - 2 Score 5 3 4  0 0  Altered sleeping 1 3 3  - 3  Tired, decreased energy 3 2 3  - 0  Change in appetite 0 2 3 - 0  Feeling bad or failure about yourself  0 1 1 - 0  Trouble concentrating 1 2 2  - 0  Moving slowly or fidgety/restless 0 1 3 - 0  Suicidal thoughts 0 0 0 - 0  PHQ-9 Score 10 14 19  - 3  Difficult doing work/chores - Very difficult Extremely dIfficult - Somewhat difficult  Some recent data might be hidden    phq 9 is positive  GAD 7 : Generalized Anxiety Score 07/26/2020 04/19/2020 02/18/2020 08/13/2019  Nervous, Anxious, on Edge 3 3 1 2   Control/stop worrying 3 2 0 1  Worry too much - different things 2 3 0 0  Trouble relaxing 2 3 1 1   Restless 2 2 0 1  Easily annoyed or irritable 1 2 0 2  Afraid - awful might happen 1 3 0 0  Total GAD 7 Score 14  18 2 7   Anxiety Difficulty - Extremely difficult Not difficult at all Very difficult     Fall Risk: Fall Risk  07/26/2020 05/27/2020 04/19/2020 02/25/2020 02/18/2020  Falls in the past year? 0 0 0 0 0  Number falls in past yr: 0 0 0 0 0  Injury with Fall? 0 0 0 0 0  Comment - - - - -    Functional Status Survey: Is the patient deaf or have difficulty hearing?: Yes Does the patient have difficulty seeing, even when wearing glasses/contacts?: No Does the patient have difficulty concentrating, remembering, or making decisions?: Yes Does the patient have difficulty walking or climbing stairs?: No Does the patient have difficulty dressing or bathing?: No Does the patient have difficulty doing errands alone such as visiting a doctor's office or shopping?: No    Assessment & Plan  1. GAD (generalized anxiety disorder)  - PARoxetine (PAXIL-CR) 25 MG 24 hr tablet; Take 1 tablet (25 mg total) by mouth daily.  Dispense: 90 tablet; Refill: 0  2. Insomnia, unspecified type  - temazepam (RESTORIL) 15 MG capsule; Take 1 capsule (15 mg total) by mouth at bedtime as needed for sleep.  Dispense: 90 capsule; Refill: 0  3. Chronic deep vein thrombosis (DVT) of distal vein of left lower extremity (H[facility])  Continue Xarelto   4. Protein C deficiency [facility])  Sees hematologist   5. Vitamin D deficiency, unspecified  - Vitamin D, Ergocalciferol, (DRISDOL) 1.25 MG (50000 UNIT) CAPS capsule; Take 1 capsule (50,000 Units total) by mouth every 7 (seven) days.  Dispense: 12 capsule; Refill: 1

## 2020-07-26 ENCOUNTER — Other Ambulatory Visit: Payer: Self-pay

## 2020-07-26 ENCOUNTER — Ambulatory Visit: Payer: 59 | Admitting: Family Medicine

## 2020-07-26 ENCOUNTER — Encounter: Payer: Self-pay | Admitting: Family Medicine

## 2020-07-26 VITALS — BP 120/76 | HR 69 | Temp 98.4°F | Resp 16 | Ht 66.0 in | Wt 176.0 lb

## 2020-07-26 DIAGNOSIS — E559 Vitamin D deficiency, unspecified: Secondary | ICD-10-CM

## 2020-07-26 DIAGNOSIS — F411 Generalized anxiety disorder: Secondary | ICD-10-CM | POA: Diagnosis not present

## 2020-07-26 DIAGNOSIS — G4700 Insomnia, unspecified: Secondary | ICD-10-CM

## 2020-07-26 DIAGNOSIS — D6859 Other primary thrombophilia: Secondary | ICD-10-CM

## 2020-07-26 DIAGNOSIS — I825Z2 Chronic embolism and thrombosis of unspecified deep veins of left distal lower extremity: Secondary | ICD-10-CM

## 2020-07-26 MED ORDER — VITAMIN D (ERGOCALCIFEROL) 1.25 MG (50000 UNIT) PO CAPS: 50000.0000 [IU] | ORAL_CAPSULE | ORAL | 1 refills | Status: DC

## 2020-07-26 MED ORDER — TEMAZEPAM 15 MG PO CAPS: 15.0000 mg | ORAL_CAPSULE | Freq: Every evening | ORAL | 0 refills | Status: DC | PRN

## 2020-07-26 MED ORDER — PAROXETINE HCL ER 25 MG PO TB24: 25.0000 mg | ORAL_TABLET | Freq: Every day | ORAL | 0 refills | Status: DC

## 2020-08-17 ENCOUNTER — Encounter: Payer: Self-pay | Admitting: *Deleted

## 2020-08-17 NOTE — Telephone Encounter (Signed)
Completed renewal Ajovy PA on Cover My Meds. Key: [id number]. Pt states Ajovy is working. Awaiting determination from [place].

## 2020-08-18 NOTE — Telephone Encounter (Signed)
Received approval notice from [facility]. Ajovy approved 08/17/20-08/17/21. Approval faxed to pharmacy. Received a receipt of confirmation.

## 2020-09-08 ENCOUNTER — Other Ambulatory Visit: Payer: Self-pay

## 2020-09-08 ENCOUNTER — Telehealth: Payer: 59 | Admitting: Family Medicine

## 2020-09-10 ENCOUNTER — Encounter: Payer: Self-pay | Admitting: Family Medicine

## 2020-09-10 ENCOUNTER — Other Ambulatory Visit: Payer: Self-pay

## 2020-09-10 ENCOUNTER — Ambulatory Visit (INDEPENDENT_AMBULATORY_CARE_PROVIDER_SITE_OTHER): Payer: 59 | Admitting: Family Medicine

## 2020-09-10 VITALS — BP 118/80 | HR 79 | Temp 98.1°F | Resp 16 | Ht 66.0 in | Wt 178.0 lb

## 2020-09-10 DIAGNOSIS — Z79899 Other long term (current) drug therapy: Secondary | ICD-10-CM

## 2020-09-10 DIAGNOSIS — E559 Vitamin D deficiency, unspecified: Secondary | ICD-10-CM

## 2020-09-10 DIAGNOSIS — Z1231 Encounter for screening mammogram for malignant neoplasm of breast: Secondary | ICD-10-CM | POA: Diagnosis not present

## 2020-09-10 DIAGNOSIS — Z0000 Encounter for general adult medical examination without abnormal findings: Secondary | ICD-10-CM

## 2020-09-10 DIAGNOSIS — I825Z2 Chronic embolism and thrombosis of unspecified deep veins of left distal lower extremity: Secondary | ICD-10-CM

## 2020-09-10 DIAGNOSIS — Z131 Encounter for screening for diabetes mellitus: Secondary | ICD-10-CM

## 2020-09-10 DIAGNOSIS — Z13220 Encounter for screening for lipoid disorders: Secondary | ICD-10-CM

## 2020-09-10 NOTE — Patient Instructions (Signed)
Preventive Care 21-41 Years Old, Female Preventive care refers to lifestyle choices and visits with your health care provider that can promote health and wellness. This includes:  A yearly physical exam. This is also called an annual wellness visit.  Regular dental and eye exams.  Immunizations.  Screening for certain conditions.  Healthy lifestyle choices, such as: ? Eating a healthy diet. ? Getting regular exercise. ? Not using drugs or products that contain nicotine and tobacco. ? Limiting alcohol use. What can I expect for my preventive care visit? Physical exam Your health care provider may check your:  Height and weight. These may be used to calculate your BMI (body mass index). BMI is a measurement that tells if you are at a healthy weight.  Heart rate and blood pressure.  Body temperature.  Skin for abnormal spots. Counseling Your health care provider may ask you questions about your:  Past medical problems.  Family's medical history.  Alcohol, tobacco, and drug use.  Emotional well-being.  Home life and relationship well-being.  Sexual activity.  Diet, exercise, and sleep habits.  Work and work environment.  Access to firearms.  Method of birth control.  Menstrual cycle.  Pregnancy history. What immunizations do I need? Vaccines are usually given at various ages, according to a schedule. Your health care provider will recommend vaccines for you based on your age, medical history, and lifestyle or other factors, such as travel or where you work.   What tests do I need? Blood tests  Lipid and cholesterol levels. These may be checked every 5 years starting at age 20.  Hepatitis C test.  Hepatitis B test. Screening  Diabetes screening. This is done by checking your blood sugar (glucose) after you have not eaten for a while (fasting).  STD (sexually transmitted disease) testing, if you are at risk.  BRCA-related cancer screening. This may be  done if you have a family history of breast, ovarian, tubal, or peritoneal cancers.  Pelvic exam and Pap test. This may be done every 3 years starting at age 21. Starting at age 30, this may be done every 5 years if you have a Pap test in combination with an HPV test. Talk with your health care provider about your test results, treatment options, and if necessary, the need for more tests.   Follow these instructions at home: Eating and drinking  Eat a healthy diet that includes fresh fruits and vegetables, whole grains, lean protein, and low-fat dairy products.  Take vitamin and mineral supplements as recommended by your health care provider.  Do not drink alcohol if: ? Your health care provider tells you not to drink. ? You are pregnant, may be pregnant, or are planning to become pregnant.  If you drink alcohol: ? Limit how much you have to 0-1 drink a day. ? Be aware of how much alcohol is in your drink. In the [place] one drink equals one 12 oz bottle of beer (355 mL), one 5 oz glass of wine (148 mL), or one 1 oz glass of hard liquor (44 mL).   Lifestyle  Take daily care of your teeth and gums. Brush your teeth every morning and night with fluoride toothpaste. Floss one time each day.  Stay active. Exercise for at least 30 minutes 5 or more days each week.  Do not use any products that contain nicotine or tobacco, such as cigarettes, e-cigarettes, and chewing tobacco. If you need help quitting, ask your health care provider.  Do not   use drugs.  If you are sexually active, practice safe sex. Use a condom or other form of protection to prevent STIs (sexually transmitted infections).  If you do not wish to become pregnant, use a form of birth control. If you plan to become pregnant, see your health care provider for a prepregnancy visit.  Find healthy ways to cope with stress, such as: ? Meditation, yoga, or listening to music. ? Journaling. ? Talking to a trusted  person. ? Spending time with friends and family. Safety  Always wear your seat belt while driving or riding in a vehicle.  Do not drive: ? If you have been drinking alcohol. Do not ride with someone who has been drinking. ? When you are tired or distracted. ? While texting.  Wear a helmet and other protective equipment during sports activities.  If you have firearms in your house, make sure you follow all gun safety procedures.  Seek help if you have been physically or sexually abused. What's next?  Go to your health care provider once a year for an annual wellness visit.  Ask your health care provider how often you should have your eyes and teeth checked.  Stay up to date on all vaccines. This information is not intended to replace advice given to you by your health care provider. Make sure you discuss any questions you have with your health care provider. Document Revised: 12/28/2019 Document Reviewed: 01/10/2018 Elsevier Patient Education  2021 [employer]  

## 2020-09-10 NOTE — Progress Notes (Signed)
Name: Tammy Garza   MRN: [id number]    DOB: Nov 09, 1980   Date:09/10/2020       Progress Note  Subjective  Chief Complaint  Annual Exam  HPI  Patient presents for annual CPE.  Diet: she eats a balanced diet  Exercise: discussed importance of regular activity   [place] Office Visit from 02/25/2020 in [facility]  AUDIT-C Score 0     Depression: Phq 9 is  Positive - but grieving the loss of her 41 yo daughter  Depression screen [place] 2/9 09/10/2020 07/26/2020 05/27/2020 04/19/2020 02/25/2020  Decreased Interest _0 0  Down, Depressed, Hopeless 2 2 0 1 0  PHQ - 2 Score _1 0  Altered sleeping _2 -  Tired, decreased energy _3 -  Change in appetite 0 0 2 3 -  Feeling bad or failure about yourself  0 0 1 1 -  Trouble concentrating 0 _4 -  Moving slowly or fidgety/restless 0 0 1 3 -  Suicidal thoughts 0 0 0 0 -  PHQ-9 Score _5 -  Difficult doing work/chores - - Very difficult Extremely dIfficult -  Some recent data might be hidden   Hypertension: BP Readings from Last 3 Encounters:  09/10/20 118/80  07/26/20 120/76  06/10/20 100/70   Obesity: Wt Readings from Last 3 Encounters:  09/10/20 178 lb (80.7 kg)  07/26/20 176 lb (79.8 kg)  06/10/20 185 lb (83.9 kg)   BMI Readings from Last 3 Encounters:  09/10/20 28.73 kg/m  07/26/20 28.41 kg/m  06/10/20 29.86 kg/m     Vaccines:   HPV: up to at age 75 , ask insurance if age between 80-45  Pneumonia: educated and discussed with patient. Flu: educated and discussed with patient.  Hep C Screening: 09/06/18 STD testing and prevention (HIV/chl/gon/syphilis): 09/06/18 Intimate partner violence: negative Sexual History :one partner , no pain during intercourse  Menstrual History/LMP/Abnormal Bleeding: no bleeding, s/p hysterectomy  Incontinence Symptoms: intermittent urinary urgency   Breast cancer:  - Last Mammogram: sent order  - BRCA gene screening: sister ovarian  cancer, seen by Dr. Kenton Kingfisher and negative genetic screen   Osteoporosis Prevention : Discussed high calcium and vitamin D supplementation, weight bearing exercises  Cervical cancer screening: N/A  Skin cancer: Discussed monitoring for atypical lesions  Colorectal cancer: start at age 27  ECG: 07/13/17  Advanced Care Planning: A voluntary discussion about advance care planning including the explanation and discussion of advance directives.  Discussed health care proxy and Living will, and the patient was able to identify a health care proxy as father .   Lipids: Lab Results  Component Value Date   CHOL 184 09/10/2019   CHOL 153 09/06/2018   CHOL 181 02/20/2018   Lab Results  Component Value Date   HDL 57 09/10/2019   HDL 61 09/06/2018   HDL 65 02/20/2018   Lab Results  Component Value Date   LDLCALC 104 (H) 09/10/2019   LDLCALC 74 09/06/2018   LDLCALC 99 02/20/2018   Lab Results  Component Value Date   TRIG 124 09/10/2019   TRIG 94 09/06/2018   TRIG 84 02/20/2018   Lab Results  Component Value Date   CHOLHDL 3.2 09/10/2019   CHOLHDL 2.5 09/06/2018   CHOLHDL 2.8 02/20/2018   No results found for: LDLDIRECT  Glucose: Glucose  Date Value Ref Range Status  04/17/2014 81 65 - 99 mg/dL  Final  04/16/2014 103 (H) 65 - 99 mg/dL Final  04/14/2014 77 65 - 99 mg/dL Final   Glucose, Bld  Date Value Ref Range Status  04/15/2020 99 70 - 99 mg/dL Final    Comment:    Glucose reference range applies only to samples taken after fasting for at least 8 hours.  10/15/2019 120 (H) 70 - 99 mg/dL Final    Comment:    Glucose reference range applies only to samples taken after fasting for at least 8 hours.  09/10/2019 70 65 - 99 mg/dL Final    Comment:    .            Fasting reference interval .     Patient Active Problem List   Diagnosis Date Noted  . Chronic migraine without aura without status migrainosus, not intractable 05/09/2020  . Family history of ovarian cancer  03/23/2020  . Pelvic mass in female 03/23/2020  . Abnormal mammogram 10/15/2019  . History of hemorrhoidectomy   . Hypocalcemia 10/13/2018  . Protein C deficiency ([place]) 12/10/2016  . Monoclonal gammopathy of unknown significance (MGUS) 11/30/2016  . History of DVT (deep vein thrombosis) 11/02/2016  . Elevated blood protein 09/26/2016  . Chronic deep vein thrombosis (DVT) of distal vein of left lower extremity ([place]) 09/17/2016  . Vitamin D deficiency 07/26/2015  . Thyroid cyst 07/26/2015  . History of Helicobacter pylori infection 04/22/2015  . Migraine with aura and without status migrainosus 04/22/2015  . Shift work sleep disorder 04/22/2015    Past Surgical History:  Procedure Laterality Date  . ABDOMINAL HYSTERECTOMY    . ANTERIOR CRUCIATE LIGAMENT REPAIR Left   . BREAST BIOPSY Left 10/07/2019   left breast calcs x clip  . CHOLECYSTECTOMY    . CYSTOSCOPY  12/19/2016   Procedure: CYSTOSCOPY;  Surgeon: Gae Dry, MD;  Location: [facility] ORS;  Service: Gynecology;;  . ESOPHAGOGASTRODUODENOSCOPY (EGD) WITH PROPOFOL N/A 09/25/2017   Procedure: ESOPHAGOGASTRODUODENOSCOPY (EGD) WITH PROPOFOL;  Surgeon: Jonathon Bellows, MD;  Location: [facility] ENDOSCOPY;  Service: Gastroenterology;  Laterality: N/A;  . LAPAROSCOPIC HYSTERECTOMY Bilateral 12/19/2016   Procedure: HYSTERECTOMY TOTAL LAPAROSCOPIC BILATERAL SALPINGECTOMY;  Surgeon: Gae Dry, MD;  Location: [facility] ORS;  Service: Gynecology;  Laterality: Bilateral;  . PLANTAR FASCIA SURGERY  06/2019  . RECTAL EXAM UNDER ANESTHESIA N/A 02/26/2019   Procedure: RECTAL EXAM UNDER ANESTHESIA, EXCISION OF ANAL SKIN TAG;  Surgeon: Fredirick Maudlin, MD;  Location: [facility] ORS;  Service: General;  Laterality: N/A;  . TUBAL LIGATION      Family History  Problem Relation Age of Onset  . HIV Sister   . Ovarian cancer Sister 8  . Migraines Sister   . Asthma Daughter   . Protein C deficiency Daughter   . Brain cancer Daughter   . Cancer Daughter   .  Migraines Daughter   . Hypertension Maternal Grandmother   . Hypertension Mother   . Obesity Mother   . Migraines Mother   . Cancer Father 78       lung cancer  . Deep vein thrombosis Father   . Protein C deficiency Father   . COPD Father   . Congestive Heart Failure Father   . Diabetes Brother   . COPD Paternal Grandmother   . Deep vein thrombosis Paternal Grandmother   . Protein C deficiency Daughter   . Migraines Daughter   . Breast cancer Neg Hx     Social History   Socioeconomic History  . Marital status: Divorced  Spouse name: Not on file  . Number of children: 2  . Years of education: 78  . Highest education level: Associate degree: occupational, Hotel manager, or vocational program  Occupational History  . Occupation: Glass blower/designer   Tobacco Use  . Smoking status: Former Smoker    Packs/day: 0.25    Years: 5.00    Pack years: 1.25    Types: Cigarettes    Start date: 05/15/2002    Quit date: 05/16/2007    Years since quitting: 13.3  . Smokeless tobacco: Never Used  Vaping Use  . Vaping Use: Never used  Substance and Sexual Activity  . Alcohol use: Yes    Alcohol/week: 1.0 standard drink    Types: 1 Glasses of wine per week    Comment: occ wine none last 24 hrs  . Drug use: Not Currently    Types: Marijuana  . Sexual activity: Yes    Partners: Male    Birth control/protection: Surgical    Comment: hysterectomy   Other Topics Concern  . Not on file  Social History Narrative   Separated since June 2016 - her youngest daughter 34 yo died of complications of glioblastoma, has an older daughter that works.  [employer] in [place].    Engaged to [employer] Strain: Low Risk   . Difficulty of Paying Living Expenses: Not very hard  Food Insecurity: No Food Insecurity  . Worried About Charity fundraiser in the Last Year: Never true  . Ran Out of Food in the Last Year: Never true  Transportation Needs: No  Transportation Needs  . Lack of Transportation (Medical): No  . Lack of Transportation (Non-Medical): No  Physical Activity: Inactive  . Days of Exercise per Week: 0 days  . Minutes of Exercise per Session: 0 min  Stress: Stress Concern Present  . Feeling of Stress : Very much  Social Connections: Socially Isolated  . Frequency of Communication with Friends and Family: More than three times a week  . Frequency of Social Gatherings with Friends and Family: Once a week  . Attends Religious Services: Never  . Active Member of Clubs or Organizations: No  . Attends Archivist Meetings: Never  . Marital Status: Divorced  Human resources officer Violence: Not At Risk  . Fear of Current or Ex-Partner: No  . Emotionally Abused: No  . Physically Abused: No  . Sexually Abused: No     Current Outpatient Medications:  .  BD PEN NEEDLE NANO 2ND GEN 32G X 4 MM MISC, , Disp: , Rfl:  .  Boric Acid CRYS, Place 600 mg vaginally 2 (two) times a week., Disp: 500 g, Rfl: 5 .  clotrimazole-betamethasone (LOTRISONE) cream, Apply 1 application topically 2 (two) times daily., Disp: 30 g, Rfl: 0 .  Fremanezumab-vfrm (AJOVY) 225 MG/1.5ML SOAJ, Inject 225 mg into the skin every 30 (thirty) days., Disp: 4.5 mL, Rfl: 3 .  PARoxetine (PAXIL-CR) 25 MG 24 hr tablet, Take 1 tablet (25 mg total) by mouth daily., Disp: 90 tablet, Rfl: 0 .  Rimegepant Sulfate (NURTEC) 75 MG TBDP, Take 1 tablet by mouth daily as needed. May repeat in 2 hours, no more than 2 in 24 hours for migraines, Disp: 8 tablet, Rfl: 1 .  SAXENDA 18 MG/3ML SOPN, INJECT 3 MG INTO THE SKIN DAILY., Disp: 9 mL, Rfl: 0 .  temazepam (RESTORIL) 15 MG capsule, Take 1 capsule (15 mg total) by mouth at  bedtime as needed for sleep., Disp: 90 capsule, Rfl: 0 .  Vitamin D, Ergocalciferol, (DRISDOL) 1.25 MG (50000 UNIT) CAPS capsule, Take 1 capsule (50,000 Units total) by mouth every 7 (seven) days., Disp: 12 capsule, Rfl: 1 .  rivaroxaban (XARELTO) 20 MG TABS  tablet, Take 1 tablet (20 mg total) by mouth daily., Disp: 30 tablet, Rfl: 1  Allergies  Allergen Reactions  . Topamax [Topiramate] Other (See Comments)    Confusion , slurred speech   . Effexor [Venlafaxine]     Gas and bloating      ROS  Constitutional: Negative for fever or weight change.  Respiratory: Negative for cough and shortness of breath.   Cardiovascular: Negative for chest pain or palpitations.  Gastrointestinal: Negative for abdominal pain, no bowel changes.  Musculoskeletal: Negative for gait problem or joint swelling.  Skin: Negative for rash.  Neurological: Negative for dizziness or headache.  No other specific complaints in a complete review of systems (except as listed in HPI above).  Objective  Vitals:   09/10/20 0904  BP: 118/80  Pulse: 79  Resp: 16  Temp: 98.1 F (36.7 C)  TempSrc: Oral  SpO2: 98%  Weight: 178 lb (80.7 kg)  Height: _0  (1.676 m)    Body mass index is 28.73 kg/m.  Physical Exam  Constitutional: Patient appears well-developed and well-nourished. No distress.  HENT: Head: Normocephalic and atraumatic. Ears: B TMs ok, no erythema or effusion; Nose: Nose normal. Mouth/Throat: Oropharynx is clear and moist. No oropharyngeal exudate.  Eyes: Conjunctivae and EOM are normal. Pupils are equal, round, and reactive to light. No scleral icterus.  Neck: Normal range of motion. Neck supple. No JVD present. No thyromegaly present.  Cardiovascular: Normal rate, regular rhythm and normal heart sounds.  No murmur heard. No BLE edema. Pulmonary/Chest: Effort normal and breath sounds normal. No respiratory distress. Abdominal: Soft. Bowel sounds are normal, no distension. There is no tenderness. no masses Breast: no lumps or masses, no nipple discharge or rashes FEMALE GENITALIA:  External genitalia normal External urethra normal Vaginal vault normal without discharge or lesions Cervix normal without discharge or lesions Bimanual exam normal  without masses RECTAL: no rectal masses or hemorrhoids Musculoskeletal: Normal range of motion, no joint effusions. No gross deformities Neurological: he is alert and oriented to person, place, and time. No cranial nerve deficit. Coordination, balance, strength, speech and gait are normal.  Skin: Skin is warm and dry. No rash noted. No erythema.  Psychiatric: Patient has a normal mood and affect. behavior is normal. Judgment and thought content normal.  Fall Risk: Fall Risk  09/10/2020 07/26/2020 05/27/2020 04/19/2020 02/25/2020  Falls in the past year? 0 0 0 0 0  Number falls in past yr: 0 0 0 0 0  Injury with Fall? 0 0 0 0 0  Comment - - - - -     Functional Status Survey: Is the patient deaf or have difficulty hearing?: Yes Does the patient have difficulty seeing, even when wearing glasses/contacts?: No Does the patient have difficulty concentrating, remembering, or making decisions?: Yes Does the patient have difficulty walking or climbing stairs?: No Does the patient have difficulty dressing or bathing?: No Does the patient have difficulty doing errands alone such as visiting a doctor's office or shopping?: No  Assessment & Plan  1. Well adult exam  - COMPLETE METABOLIC PANEL WITH GFR - CBC with Differential/Platelet - Lipid panel - VITAMIN D 25 Hydroxy (Vit-D Deficiency, Fractures) - Hemoglobin A1c  2. Encounter  for screening mammogram for malignant neoplasm of breast   3. Vitamin D deficiency, unspecified  - VITAMIN D 25 Hydroxy (Vit-D Deficiency, Fractures)  4. Chronic deep vein thrombosis (DVT) of distal vein of left lower extremity (HCC)   5. Long-term use of high-risk medication  - COMPLETE METABOLIC PANEL WITH GFR - CBC with Differential/Platelet  6. Lipid screening  - Lipid panel  7. Diabetes mellitus screening  - Hemoglobin A1c  -USPSTF grade A and B recommendations reviewed with patient; age-appropriate recommendations, preventive care, screening  tests, etc discussed and encouraged; healthy living encouraged; see AVS for patient education given to patient -Discussed importance of 150 minutes of physical activity weekly, eat two servings of fish weekly, eat one serving of tree nuts ( cashews, pistachios, pecans, almonds.Marland Kitchen) every other day, eat 6 servings of fruit/vegetables daily and drink plenty of water and avoid sweet beverages.

## 2020-09-11 LAB — COMPLETE METABOLIC PANEL WITH GFR
AG Ratio: 1.7 (calc) (ref 1.0–2.5)
ALT: 9 U/L (ref 6–29)
AST: 10 U/L (ref 10–30)
Albumin: 4.6 g/dL (ref 3.6–5.1)
Alkaline phosphatase (APISO): 49 U/L (ref 31–125)
BUN: 10 mg/dL (ref 7–25)
CO2: 30 mmol/L (ref 20–32)
Calcium: 9.8 mg/dL (ref 8.6–10.2)
Chloride: 104 mmol/L (ref 98–110)
Creat: 0.66 mg/dL (ref 0.50–1.10)
GFR, Est African American: 127 mL/min/{1.73_m2} (ref >=60)
GFR, Est Non African American: 110 mL/min/{1.73_m2} (ref >=60)
Globulin: 2.7 g/dL (calc) (ref 1.9–3.7)
Glucose, Bld: 76 mg/dL (ref 65–99)
Potassium: 5.1 mmol/L (ref 3.5–5.3)
Sodium: 139 mmol/L (ref 135–146)
Total Bilirubin: 0.5 mg/dL (ref 0.2–1.2)
Total Protein: 7.3 g/dL (ref 6.1–8.1)

## 2020-09-11 LAB — CBC WITH DIFFERENTIAL/PLATELET
Absolute Monocytes: 673 cells/uL (ref 200–950)
Basophils Absolute: 41 cells/uL (ref 0–200)
Basophils Relative: 0.6 %
Eosinophils Absolute: 231 cells/uL (ref 15–500)
Eosinophils Relative: 3.4 %
HCT: 39.8 % (ref 35.0–45.0)
Hemoglobin: 12.9 g/dL (ref 11.7–15.5)
Lymphs Abs: 2523 cells/uL (ref 850–3900)
MCH: 30.1 pg (ref 27.0–33.0)
MCHC: 32.4 g/dL (ref 32.0–36.0)
MCV: 93 fL (ref 80.0–100.0)
MPV: 11.3 fL (ref 7.5–12.5)
Monocytes Relative: 9.9 %
Neutro Abs: 3332 cells/uL (ref 1500–7800)
Neutrophils Relative %: 49 %
Platelets: 254 10*3/uL (ref 140–400)
RBC: 4.28 10*6/uL (ref 3.80–5.10)
RDW: 11.7 % (ref 11.0–15.0)
Total Lymphocyte: 37.1 %
WBC: 6.8 10*3/uL (ref 3.8–10.8)

## 2020-09-11 LAB — HEMOGLOBIN A1C
Hgb A1c MFr Bld: 4.9 % of total Hgb (ref <5.7)
Mean Plasma Glucose: 94 mg/dL
eAG (mmol/L): 5.2 mmol/L

## 2020-09-11 LAB — LIPID PANEL
Cholesterol: 186 mg/dL (ref <200)
HDL: 67 mg/dL (ref >=50)
LDL Cholesterol (Calc): 98 mg/dL (calc)
Non-HDL Cholesterol (Calc): 119 mg/dL (calc) (ref <130)
Total CHOL/HDL Ratio: 2.8 (calc) (ref <5.0)
Triglycerides: 109 mg/dL (ref <150)

## 2020-09-11 LAB — VITAMIN D 25 HYDROXY (VIT D DEFICIENCY, FRACTURES): Vit D, 25-Hydroxy: 55 ng/mL (ref 30–100)

## 2020-09-13 ENCOUNTER — Other Ambulatory Visit: Payer: Self-pay | Admitting: Family Medicine

## 2020-09-13 DIAGNOSIS — Z1231 Encounter for screening mammogram for malignant neoplasm of breast: Secondary | ICD-10-CM

## 2020-09-21 ENCOUNTER — Other Ambulatory Visit: Payer: Self-pay

## 2020-09-21 ENCOUNTER — Telehealth (INDEPENDENT_AMBULATORY_CARE_PROVIDER_SITE_OTHER): Payer: 59 | Admitting: Adult Health

## 2020-09-21 ENCOUNTER — Ambulatory Visit
Admission: RE | Admit: 2020-09-21 | Discharge: 2020-09-21 | Disposition: A | Discharge status: Home / Self Care | Payer: 59 | Source: Ambulatory Visit | Attending: Family Medicine | Admitting: Family Medicine

## 2020-09-21 DIAGNOSIS — Z1231 Encounter for screening mammogram for malignant neoplasm of breast: Secondary | ICD-10-CM | POA: Insufficient documentation

## 2020-09-21 DIAGNOSIS — G43709 Chronic migraine without aura, not intractable, without status migrainosus: Secondary | ICD-10-CM

## 2020-09-21 MED ORDER — RIVAROXABAN 20 MG PO TABS: 20.0000 mg | ORAL_TABLET | Freq: Every day | ORAL | 1 refills | Status: DC

## 2020-09-21 NOTE — Progress Notes (Addendum)
PATIENT: Tammy Garza DOB: 1980/09/24  REASON FOR VISIT: follow up HISTORY FROM: patient  Virtual Visit via Video Note  I connected with Tammy Garza on 09/21/20 at  2:15 PM EDT by a video enabled telemedicine application located remotely at [facility] and verified that I am speaking with the correct person using two identifiers who was located at their own home.   I discussed the limitations of evaluation and management by telemedicine and the availability of in person appointments. The patient expressed understanding and agreed to proceed.   PATIENT: Tammy Garza DOB: 11/20/1980  REASON FOR VISIT: follow up HISTORY FROM: patient  HISTORY OF PRESENT ILLNESS: Today 09/21/20:  Tammy Garza is a 40 year old female with a history of migraine headaches.  She returns today for follow-up.  She reports that her headaches have improved with Ajovy.  She only has 2-3 headache days a month.  Nurtec usually resolves the headache fairly quickly.  She states a few days before her next injection she typically will get a headache.  She has noticed recently she has been getting a site reaction with her injections that would last for approximately 1 week.  She states that it is red and itchy.  She joins [place] today for virtual visit.  HISTORY Tammy Garza is a 40 y.o. female here as requested by [name], MD for migraines.  She has a past medical history of protein C deficiency, MGUS, DVT, vitamin D deficiency, migraine with aura, shiftwork sleep disorder.  I reviewed Dr. Freddy Finner notes: She is to see Dr. Domingo Cocking but because he missed glioblastoma on her youngest daughter she would like to see someone else.  She is taking gabapentin since 2018, episodes down to about 3 times per month, she has associated nausea, photophobia and phonophobia, pain is described as temporal throbbing-like sometimes nuchal area, she has intermittent numbness on 1 side of her body sometimes severe  nausea.  She is here alone and reports she has had them since a teen. Started right after menses. Mother, sister have migraines. She will have some with auras, spotting, she may also go numb in arms, she has photo/phono/osmophobia, change of weather can trigger and worsen her migraines, nausea, vomiting, pulsating/pounding/throbbing, jaws are sight, neck gets tight, they start in the temples unilaterally and then progress to the other side, movement makes them worse, she wants to go in a dark room. She has 19 headache days a month, at lest 9 of them are migraines that can be moderately severe to severe. She does not always get an aura. She has taken Gabapentin in the past, compazine, baclofen. No other focal neurologic deficits, associated symptoms, inciting events or modifiable factors. Not positional. No changes in quality. Not exertional.   From a review of records, meds tried include: Baclofen, nerve blocks, compazine, topamax, gabapentin, amiytroiptyline, rizatriptan, baclofen, compazine, tylenol, ibuprofen(can't take now), thorazine, flexeril, decadron, nurtec. Ketorolac inj, medrol dosepak, zofran, paxil, phenergan, effexor, trazodone, zonisa,mide.   Reviewed notes, labs and imaging from outside physicians, which showed: MRI brain 04/18/2018:  FINDINGS: Brain: No acute infarction, hemorrhage, hydrocephalus, extra-axial collection or mass lesion. Complete corpus callosum and vermis. Morphologically normal pituitary gland. No cerebellar tonsillar ectopia. No structural or signal abnormality of brain identified.  Vascular: Normal flow voids.  Skull and upper cervical spine: Normal marrow signal.  Sinuses/Orbits: Negative.  Other: None.  IMPRESSION: No acute abnormality identified. Unremarkable MRI of the brain.  REVIEW OF SYSTEMS: Out of a complete 14 system review  of symptoms, the patient complains only of the following symptoms, and all other reviewed systems are  negative.  ALLERGIES: Allergies  Allergen Reactions  . Topamax [Topiramate] Other (See Comments)    Confusion , slurred speech   . Effexor [Venlafaxine]     Gas and bloating     HOME MEDICATIONS: Outpatient Medications Prior to Visit  Medication Sig Dispense Refill  . BD PEN NEEDLE NANO 2ND GEN 32G X 4 MM MISC     . Boric Acid CRYS Place 600 mg vaginally 2 (two) times a week. 500 g 5  . clotrimazole-betamethasone (LOTRISONE) cream Apply 1 application topically 2 (two) times daily. 30 g 0  . Fremanezumab-vfrm (AJOVY) 225 MG/1.5ML SOAJ Inject 225 mg into the skin every 30 (thirty) days. 4.5 mL 3  . PARoxetine (PAXIL-CR) 25 MG 24 hr tablet Take 1 tablet (25 mg total) by mouth daily. 90 tablet 0  . Rimegepant Sulfate (NURTEC) 75 MG TBDP Take 1 tablet by mouth daily as needed. May repeat in 2 hours, no more than 2 in 24 hours for migraines 8 tablet 1  . rivaroxaban (XARELTO) 20 MG TABS tablet Take 1 tablet (20 mg total) by mouth daily. 30 tablet 1  . SAXENDA 18 MG/3ML SOPN INJECT 3 MG INTO THE SKIN DAILY. 9 mL 0  . temazepam (RESTORIL) 15 MG capsule Take 1 capsule (15 mg total) by mouth at bedtime as needed for sleep. 90 capsule 0  . Vitamin D, Ergocalciferol, (DRISDOL) 1.25 MG (50000 UNIT) CAPS capsule Take 1 capsule (50,000 Units total) by mouth every 7 (seven) days. 12 capsule 1   No facility-administered medications prior to visit.    PAST MEDICAL HISTORY: Past Medical History:  Diagnosis Date  . Anemia    h/o  . BRCA negative 01/2020   MyRisk neg except AXIN2 and RPS20 VUS  . Cervical high risk human papillomavirus (HPV) DNA test positive   . Chronic gastritis   . Depression   . DVT (deep venous thrombosis) ([place]) 09/2016   left leg  . Family history of ovarian cancer   . GERD (gastroesophageal reflux disease)   . H. pylori infection   . History of attempted suicide   . History of gastric polyp   . History of kidney stones    h/o  . Hyperhidrosis   . Increased risk of  breast cancer 01/2020   IBIS=20.5%  . Migraines   . Protein C deficiency ([place])   . Rash   . Shift work sleep disorder     PAST SURGICAL HISTORY: Past Surgical History:  Procedure Laterality Date  . ABDOMINAL HYSTERECTOMY    . ANTERIOR CRUCIATE LIGAMENT REPAIR Left   . BREAST BIOPSY Left 10/07/2019   left breast calcs x clip  . CHOLECYSTECTOMY    . CYSTOSCOPY  12/19/2016   Procedure: CYSTOSCOPY;  Surgeon: Gae Dry, MD;  Location: [facility] ORS;  Service: Gynecology;;  . ESOPHAGOGASTRODUODENOSCOPY (EGD) WITH PROPOFOL N/A 09/25/2017   Procedure: ESOPHAGOGASTRODUODENOSCOPY (EGD) WITH PROPOFOL;  Surgeon: Jonathon Bellows, MD;  Location[facility] END[facility]COPY;  Service: Gastroenterology;  Laterality: N/A;  . LAPAROSCOPIC HYSTERECTOMY Bilateral 12/19/2016   Procedure: HYSTERECTOMY TOTAL LAPAROSCOPIC BILATERAL SALPINGECTOMY;  Surgeon: Gae Dry, MD;  Location: [facility] ORS;  Service: Gynecology;  Laterality: Bilateral;  . PLANTAR FASCIA SURGERY  06/2019  . RECTAL EXAM UNDER ANESTHESIA N/A 02/26/2019   Procedure: RECTAL EXAM UNDER ANESTHESIA, EXCISION OF ANAL SKIN TAG;  Surgeon: Fredirick Maudlin, MD;  Location: [facility] ORS;  Service: General;  Laterality: N/A;  . TUBAL LIGATION      FAMILY HISTORY: Family History  Problem Relation Age of Onset  . HIV Sister   . Ovarian cancer Sister 33  . Migraines Sister   . Asthma Daughter   . Protein C deficiency Daughter   . Brain cancer Daughter   . Cancer Daughter   . Migraines Daughter   . Hypertension Maternal Grandmother   . Hypertension Mother   . Obesity Mother   . Migraines Mother   . Cancer Father 15       lung cancer  . Deep vein thrombosis Father   . Protein C deficiency Father   . COPD Father   . Congestive Heart Failure Father   . Diabetes Brother   . COPD Paternal Grandmother   . Deep vein thrombosis Paternal Grandmother   . Protein C deficiency Daughter   . Migraines Daughter   . Breast cancer Neg Hx     SOCIAL HISTORY: Social  History   Socioeconomic History  . Marital status: Divorced    Spouse name: Not on file  . Number of children: 2  . Years of education: 33  . Highest education level: Associate degree: occupational, Hotel manager, or vocational program  Occupational History  . Occupation: Glass blower/designer   Tobacco Use  . Smoking status: Former Smoker    Packs/day: 0.25    Years: 5.00    Pack years: 1.25    Types: Cigarettes    Start date: 05/15/2002    Quit date: 05/16/2007    Years since quitting: 13.3  . Smokeless tobacco: Never Used  Vaping Use  . Vaping Use: Never used  Substance and Sexual Activity  . Alcohol use: Yes    Alcohol/week: 1.0 standard drink    Types: 1 Glasses of wine per week    Comment: occ wine none last 24 hrs  . Drug use: Not Currently    Types: Marijuana  . Sexual activity: Yes    Partners: Male    Birth control/protection: Surgical    Comment: hysterectomy   Other Topics Concern  . Not on file  Social History Narrative   Separated since June 2016 - her youngest daughter 63 yo died of complications of glioblastoma, has an older daughter that works.  [employer] in [place].    Engaged to [employer] Strain: Low Risk   . Difficulty of Paying Living Expenses: Not very hard  Food Insecurity: No Food Insecurity  . Worried About Charity fundraiser in the Last Year: Never true  . Ran Out of Food in the Last Year: Never true  Transportation Needs: No Transportation Needs  . Lack of Transportation (Medical): No  . Lack of Transportation (Non-Medical): No  Physical Activity: Inactive  . Days of Exercise per Week: 0 days  . Minutes of Exercise per Session: 0 min  Stress: Stress Concern Present  . Feeling of Stress : Very much  Social Connections: Socially Isolated  . Frequency of Communication with Friends and Family: More than three times a week  . Frequency of Social Gatherings with Friends and Family: Once a week  .  Attends Religious Services: Never  . Active Member of Clubs or Organizations: No  . Attends Archivist Meetings: Never  . Marital Status: Divorced  Human resources officer Violence: Not At Risk  . Fear of Current or Ex-Partner: No  . Emotionally Abused: No  . Physically Abused:  No  . Sexually Abused: No      PHYSICAL EXAM Generalized: Well developed, in no acute distress   Neurological examination  Mentation: Alert oriented to time, place, history taking. Follows all commands speech and language fluent Cranial nerve II-XII:Extraocular movements were full. Facial symmetry noted. uvula tongue midline. Head turning and shoulder shrug  were normal and symmetric. Motor: Good strength throughout subjectively per patient Sensory: Sensory testing is intact to soft touch on all 4 extremities subjectively per patient Coordination: Cerebellar testing reveals good finger-nose-finger  Gait and station: Patient is able to stand from a seated position. gait is normal.  Reflexes: UTA  DIAGNOSTIC DATA (LABS, IMAGING, TESTING) - I reviewed patient records, labs, notes, testing and imaging myself where available.  Lab Results  Component Value Date   WBC 6.8 09/10/2020   HGB 12.9 09/10/2020   HCT 39.8 09/10/2020   MCV 93.0 09/10/2020   PLT 254 09/10/2020      Component Value Date/Time   NA 139 09/10/2020 0950   NA 141 07/26/2015 1244   NA 141 04/17/2014 0502   K 5.1 09/10/2020 0950   K 4.4 04/17/2014 0502   CL 104 09/10/2020 0950   CL 107 04/17/2014 0502   CO2 30 09/10/2020 0950   CO2 27 04/17/2014 0502   GLUCOSE 76 09/10/2020 0950   GLUCOSE 81 04/17/2014 0502   BUN 10 09/10/2020 0950   BUN 6 07/26/2015 1244   BUN 4 (L) 04/17/2014 0502   CREATININE 0.66 09/10/2020 0950   CALCIUM 9.8 09/10/2020 0950   CALCIUM 7.7 (L) 04/17/2014 0502   PROT 7.3 09/10/2020 0950   PROT 7.2 07/26/2015 1244   PROT 6.4 04/17/2014 0502   ALBUMIN 4.4 04/15/2020 0824   ALBUMIN 4.2 07/26/2015 1244    ALBUMIN 3.1 (L) 04/17/2014 0502   AST 10 09/10/2020 0950   AST 14 (L) 04/17/2014 0502   ALT 9 09/10/2020 0950   ALT 13 (L) 04/17/2014 0502   ALKPHOS 43 04/15/2020 0824   ALKPHOS 44 (L) 04/17/2014 0502   BILITOT 0.5 09/10/2020 0950   BILITOT 0.6 07/26/2015 1244   BILITOT 0.9 04/17/2014 0502   GFRNONAA 110 09/10/2020 0950   GFRAA 127 09/10/2020 0950   Lab Results  Component Value Date   CHOL 186 09/10/2020   HDL 67 09/10/2020   LDLCALC 98 09/10/2020   TRIG 109 09/10/2020   CHOLHDL 2.8 09/10/2020   Lab Results  Component Value Date   HGBA1C 4.9 09/10/2020   Lab Results  Component Value Date   VITAMINB12 665 07/30/2018   Lab Results  Component Value Date   TSH 2.04 02/20/2018      ASSESSMENT AND PLAN 40 y.o. year old female  has a past medical history of Anemia, BRCA negative (01/2020), Cervical high risk human papillomavirus (HPV) DNA test positive, Chronic gastritis, Depression, DVT (deep venous thrombosis) ([place]) (09/2016), Family history of ovarian cancer, GERD (gastroesophageal reflux disease), H. pylori infection, History of attempted suicide, History of gastric polyp, History of kidney stones, Hyperhidrosis, Increased risk of breast cancer (01/2020), Migraines, Protein C deficiency ([place]), Rash, and Shift work sleep disorder. here with :  1.  Migraine headaches  --Continue Ajovy monthly injection -- Continue Nurtec 75 mg 1 tablet in 24 hours for abortive therapy as needed -- Advised if headache frequency increases she should let us know -- Advised that she can use hydrocortisone cream for site reaction from Ajovy.  Advised if does not helpful she can take Benadryl 25 mg 1  hour before her injection.  Did review the potential side effects of Benadryl with the patient    Ward Givens, MSN, NP-C 09/21/2020, 2:31 PM [facility] [address], [place], [place] [address] [phone]  agree with assessment and plan as stated.     Sarina Ill, MD [facility]

## 2020-09-23 ENCOUNTER — Encounter: Payer: Self-pay | Admitting: Family Medicine

## 2020-10-19 ENCOUNTER — Other Ambulatory Visit: Payer: Self-pay | Admitting: Family Medicine

## 2020-10-19 DIAGNOSIS — G4700 Insomnia, unspecified: Secondary | ICD-10-CM

## 2020-10-19 MED ORDER — TEMAZEPAM 15 MG PO CAPS: 15.0000 mg | ORAL_CAPSULE | Freq: Every evening | ORAL | 0 refills | Status: DC | PRN

## 2020-10-26 ENCOUNTER — Other Ambulatory Visit: Payer: Self-pay | Admitting: *Deleted

## 2020-10-26 DIAGNOSIS — I825Z2 Chronic embolism and thrombosis of unspecified deep veins of left distal lower extremity: Secondary | ICD-10-CM

## 2020-10-27 ENCOUNTER — Encounter: Payer: Self-pay | Admitting: Oncology

## 2020-10-27 ENCOUNTER — Inpatient Hospital Stay: Payer: 59 | Admitting: Oncology

## 2020-10-27 ENCOUNTER — Inpatient Hospital Stay: Payer: 59 | Attending: Oncology

## 2020-10-27 ENCOUNTER — Other Ambulatory Visit: Payer: Self-pay

## 2020-10-27 VITALS — BP 102/79 | HR 56 | Temp 96.8°F | Resp 16 | Wt 178.0 lb

## 2020-10-27 DIAGNOSIS — Z86718 Personal history of other venous thrombosis and embolism: Secondary | ICD-10-CM | POA: Insufficient documentation

## 2020-10-27 DIAGNOSIS — D649 Anemia, unspecified: Secondary | ICD-10-CM | POA: Insufficient documentation

## 2020-10-27 DIAGNOSIS — Z7901 Long term (current) use of anticoagulants: Secondary | ICD-10-CM | POA: Diagnosis not present

## 2020-10-27 DIAGNOSIS — D6859 Other primary thrombophilia: Secondary | ICD-10-CM | POA: Diagnosis present

## 2020-10-27 DIAGNOSIS — I825Z2 Chronic embolism and thrombosis of unspecified deep veins of left distal lower extremity: Secondary | ICD-10-CM | POA: Diagnosis not present

## 2020-10-27 LAB — COMPREHENSIVE METABOLIC PANEL
ALT: 12 U/L (ref 0–44)
AST: 13 U/L — ABNORMAL LOW (ref 15–41)
Albumin: 4.3 g/dL (ref 3.5–5.0)
Alkaline Phosphatase: 41 U/L (ref 38–126)
Anion gap: 6 (ref 5–15)
BUN: 10 mg/dL (ref 6–20)
CO2: 25 mmol/L (ref 22–32)
Calcium: 9 mg/dL (ref 8.9–10.3)
Chloride: 105 mmol/L (ref 98–111)
Creatinine, Ser: 0.64 mg/dL (ref 0.44–1.00)
GFR, Estimated: >60 mL/min (ref >60)
Glucose, Bld: 86 mg/dL (ref 70–99)
Potassium: 3.8 mmol/L (ref 3.5–5.1)
Sodium: 136 mmol/L (ref 135–145)
Total Bilirubin: 0.6 mg/dL (ref 0.3–1.2)
Total Protein: 7.4 g/dL (ref 6.5–8.1)

## 2020-10-27 LAB — CBC WITH DIFFERENTIAL/PLATELET
Abs Immature Granulocytes: 0.01 10*3/uL (ref 0.00–0.07)
Basophils Absolute: 0.1 10*3/uL (ref 0.0–0.1)
Basophils Relative: 1 %
Eosinophils Absolute: 0.2 10*3/uL (ref 0.0–0.5)
Eosinophils Relative: 3 %
HCT: 34.9 % — ABNORMAL LOW (ref 36.0–46.0)
Hemoglobin: 11.6 g/dL — ABNORMAL LOW (ref 12.0–15.0)
Immature Granulocytes: 0 %
Lymphocytes Relative: 47 %
Lymphs Abs: 3.6 10*3/uL (ref 0.7–4.0)
MCH: 30.6 pg (ref 26.0–34.0)
MCHC: 33.2 g/dL (ref 30.0–36.0)
MCV: 92.1 fL (ref 80.0–100.0)
Monocytes Absolute: 0.6 10*3/uL (ref 0.1–1.0)
Monocytes Relative: 7 %
Neutro Abs: 3.2 10*3/uL (ref 1.7–7.7)
Neutrophils Relative %: 42 %
Platelets: 273 10*3/uL (ref 150–400)
RBC: 3.79 MIL/uL — ABNORMAL LOW (ref 3.87–5.11)
RDW: 12.1 % (ref 11.5–15.5)
WBC: 7.6 10*3/uL (ref 4.0–10.5)
nRBC: 0 % (ref 0.0–0.2)

## 2020-10-27 MED ORDER — RIVAROXABAN 20 MG PO TABS: 20.0000 mg | ORAL_TABLET | Freq: Every day | ORAL | 2 refills | Status: DC

## 2020-10-27 NOTE — Progress Notes (Signed)
Hematology/Oncology Consult note [facility]  [phone] [phone]  Patient Care Team: [name], MD as PCP - General (Family Medicine) Lequita Asal, MD as Referring Physician (Hematology and Oncology) Jonathon Bellows, MD as Consulting Physician (Gastroenterology) Dasher, Rayvon Char, MD (Dermatology) Orie Rout, MD as Referring Physician (Specialist) Jonathon Bellows, MD as Consulting Physician (Gastroenterology)   Name of the patient: Tammy Garza  [id number]  Jul 16, 1980   Date of visit: 10/27/20  Diagnosis-history of left lower extremity DVT and protein C deficiency on chronic long-term anticoagulation  Chief complaint/ Reason for visit-routine follow-up of left lower extremity DVT on Xarelto  Heme/Onc history: Patient is a 40 year old African-American female diagnosed with left lower extremity DVT in May 2018 following a 2-1/2-hour flight.  Prior to that she was on Depo-Provera for a year.  Left lower extremity Doppler at that time showed acute appearing thrombus in the calf veins involving the posterior tibial and peroneal veins.  Patient was on Eliquis since 2018 but was switched to Xarelto starting January 2022 due to insurance issues.  Family history of DVT in her father and her daughter who subsequently died of a glioblastoma Hypercoagulable work-up on 09/26/2016 and 11/30/2016 revealed the following negative studies:  Factor V Leiden, prothrombin gene mutation, lupus anticoagulant panel, anticardiolipin antibodies, beta2-glycoprotein, protein S antigen/activity, and ATIII antigen/activity.  She has protein C deficiency.  Labs on 11/30/2016 revealed a protein C total of 40% (60 - 150%) and protein C activity of 49% (73-180%).  Repeat testing on 03/29/2017 revealed a protein C total of 51% and a protein C activity of 51%.  She has a history of MGUS when IgA monoclonal protein was found on immunofixation on SPEP back in May 2018  but was not subsequently detected on myeloma panel on 2 separate occasions.    Interval history-patient reports being compliant with Xarelto.  She does not miss any doses.  Denies any exertional shortness of breath or leg swelling.  ECOG PS- 0 Pain scale- 0   Review of systems- Review of Systems  Constitutional:  Negative for chills, fever, malaise/fatigue and weight loss.  HENT:  Negative for congestion, ear discharge and nosebleeds.   Eyes:  Negative for blurred vision.  Respiratory:  Negative for cough, hemoptysis, sputum production, shortness of breath and wheezing.   Cardiovascular:  Negative for chest pain, palpitations, orthopnea and claudication.  Gastrointestinal:  Negative for abdominal pain, blood in stool, constipation, diarrhea, heartburn, melena, nausea and vomiting.  Genitourinary:  Negative for dysuria, flank pain, frequency, hematuria and urgency.  Musculoskeletal:  Negative for back pain, joint pain and myalgias.  Skin:  Negative for rash.  Neurological:  Negative for dizziness, tingling, focal weakness, seizures, weakness and headaches.  Endo/Heme/Allergies:  Does not bruise/bleed easily.  Psychiatric/Behavioral:  Negative for depression and suicidal ideas. The patient does not have insomnia.      Allergies  Allergen Reactions   Topamax [Topiramate] Other (See Comments)    Confusion , slurred speech    Effexor [Venlafaxine]     Gas and bloating      Past Medical History:  Diagnosis Date   Anemia    h/o   BRCA negative 01/2020   MyRisk neg except AXIN2 and RPS20 VUS   Cervical high risk human papillomavirus (HPV) DNA test positive    Chronic gastritis    Depression    DVT (deep venous thrombosis) ([place]) 09/2016   left leg   Family history of ovarian cancer  GERD (gastroesophageal reflux disease)    H. pylori infection    History of attempted suicide    History of gastric polyp    History of kidney stones    h/o   Hyperhidrosis    Increased risk  of breast cancer 01/2020   IBIS=20.5%   Migraines    Protein C deficiency ([place])    Rash    Shift work sleep disorder      Past Surgical History:  Procedure Laterality Date   ABDOMINAL HYSTERECTOMY     ANTERIOR CRUCIATE LIGAMENT REPAIR Left    BREAST BIOPSY Left 10/07/2019   left breast calcs x clip   CHOLECYSTECTOMY     CYSTOSCOPY  12/19/2016   Procedure: CYSTOSCOPY;  Surgeon: Gae Dry, MD;  Location: [facility] OR[facility];  Service: Gynecology;;   ESOPHAGOGASTRODUODENOSCOPY (EGD) WITH PROPOFOL N/A 09/25/2017   Procedure: ESOPHAGOGASTRODUODENOSCOPY (EGD) WITH PROPOFOL;  Surgeon: Jonathon Bellows, MD;  Location: [facility] END[facility]COPY;  Service: Gastroenterology;  Laterality: N/A;   LAPAROSCOPIC HYSTERECTOMY Bilateral 12/19/2016   Procedure: HYSTERECTOMY TOTAL LAPAROSCOPIC BILATERAL SALPINGECTOMY;  Surgeon: Gae Dry, MD;  Location: [facility] ORS;  Service: Gynecology;  Laterality: Bilateral;   PLANTAR FASCIA SURGERY  06/2019   RECTAL EXAM UNDER ANESTHESIA N/A 02/26/2019   Procedure: RECTAL EXAM UNDER ANESTHESIA, EXCISION OF ANAL SKIN TAG;  Surgeon: Fredirick Maudlin, MD;  Location: [facility] OR[facility];  Service: General;  Laterality: N/A;   TUBAL LIGATION      Social History   Socioeconomic History   Marital status: Divorced    Spouse name: Not on file   Number of children: 2   Years of education: 13   Highest education level: Associate degree: occupational, Hotel manager, or vocational program  Occupational History   Occupation: Glass blower/designer   Tobacco Use   Smoking status: Former    Packs/day: 0.25    Years: 5.00    Pack years: 1.25    Types: Cigarettes    Start date: 05/15/2002    Quit date: 05/16/2007    Years since quitting: 13.4   Smokeless tobacco: Never  Vaping Use   Vaping Use: Never used  Substance and Sexual Activity   Alcohol use: Yes    Alcohol/week: 1.0 standard drink    Types: 1 Glasses of wine per week    Comment: occ wine none last 24 hrs   Drug use: Not Currently    Types:  Marijuana   Sexual activity: Yes    Partners: Male    Birth control/protection: Surgical    Comment: hysterectomy   Other Topics Concern   Not on file  Social History Narrative   Separated since June 2016 - her youngest daughter 53 yo died of complications of glioblastoma, has an older daughter that works.  [employer] in [place].    Engaged to [employer]: Low Risk    Difficulty of Paying Living Expenses: Not very hard  Food Insecurity: No Food Insecurity   Worried About Charity fundraiser in the Last Year: Never true   Arboriculturist in the Last Year: Never true  Transportation Needs: No Transportation Needs   Lack of Transportation (Medical): No   Lack of Transportation (Non-Medical): No  Physical Activity: Inactive   Days of Exercise per Week: 0 days   Minutes of Exercise per Session: 0 min  Stress: Stress Concern Present   Feeling of Stress : Very much  Social Connections: Socially Isolated  Frequency of Communication with Friends and Family: More than three times a week   Frequency of Social Gatherings with Friends and Family: Once a week   Attends Religious Services: Never   Marine scientist or Organizations: No   Attends Music therapist: Never   Marital Status: Divorced  Human resources officer Violence: Not At Risk   Fear of Current or Ex-Partner: No   Emotionally Abused: No   Physically Abused: No   Sexually Abused: No    Family History  Problem Relation Age of Onset   HIV Sister    Ovarian cancer Sister 34   Migraines Sister    Asthma Daughter    Protein C deficiency Daughter    Brain cancer Daughter    Cancer Daughter    Migraines Daughter    Hypertension Maternal Grandmother    Hypertension Mother    Obesity Mother    Migraines Mother    Cancer Father 8       lung cancer   Deep vein thrombosis Father    Protein C deficiency Father    COPD Father    Congestive Heart  Failure Father    Diabetes Brother    COPD Paternal Grandmother    Deep vein thrombosis Paternal Grandmother    Protein C deficiency Daughter    Migraines Daughter    Breast cancer Neg Hx      Current Outpatient Medications:    BD PEN NEEDLE NANO 2ND GEN 32G X 4 MM MISC, , Disp: , Rfl:    Boric Acid CRYS, Place 600 mg vaginally 2 (two) times a week., Disp: 500 g, Rfl: 5   Fremanezumab-vfrm (AJOVY) 225 MG/1.5ML SOAJ, Inject 225 mg into the skin every 30 (thirty) days., Disp: 4.5 mL, Rfl: 3   PARoxetine (PAXIL-CR) 25 MG 24 hr tablet, Take 1 tablet (25 mg total) by mouth daily., Disp: 90 tablet, Rfl: 0   Rimegepant Sulfate (NURTEC) 75 MG TBDP, Take 1 tablet by mouth daily as needed. May repeat in 2 hours, no more than 2 in 24 hours for migraines, Disp: 8 tablet, Rfl: 1   SAXENDA 18 MG/3ML SOPN, INJECT 3 MG INTO THE SKIN DAILY., Disp: 9 mL, Rfl: 0   temazepam (RESTORIL) 15 MG capsule, Take 1 capsule (15 mg total) by mouth at bedtime as needed for sleep., Disp: 90 capsule, Rfl: 0   Vitamin D, Ergocalciferol, (DRISDOL) 1.25 MG (50000 UNIT) CAPS capsule, Take 1 capsule (50,000 Units total) by mouth every 7 (seven) days., Disp: 12 capsule, Rfl: 1   clotrimazole-betamethasone (LOTRISONE) cream, Apply 1 application topically 2 (two) times daily. (Patient not taking: Reported on 10/27/2020), Disp: 30 g, Rfl: 0   rivaroxaban (XARELTO) 20 MG TABS tablet, Take 1 tablet (20 mg total) by mouth daily., Disp: 90 tablet, Rfl: 2  Physical exam:  Vitals:   10/27/20 0919  BP: 102/79  Pulse: (!) 56  Resp: 16  Temp: (!) 96.8 F (36 C)  Weight: 178 lb 0.3 oz (80.7 kg)   Physical Exam Cardiovascular:     Rate and Rhythm: Normal rate and regular rhythm.     Heart sounds: Normal heart sounds.  Pulmonary:     Effort: Pulmonary effort is normal.     Breath sounds: Normal breath sounds.  Abdominal:     General: Bowel sounds are normal.     Palpations: Abdomen is soft.  Skin:    General: Skin is warm and  dry.  Neurological:     Mental  Status: She is alert and oriented to person, place, and time.     CMP Latest Ref Rng & Units 10/27/2020  Glucose 70 - 99 mg/dL 86  BUN 6 - 20 mg/dL 10  Creatinine 0.44 - 1.00 mg/dL 0.64  Sodium 135 - 145 mmol/L 136  Potassium 3.5 - 5.1 mmol/L 3.8  Chloride 98 - 111 mmol/L 105  CO2 22 - 32 mmol/L 25  Calcium 8.9 - 10.3 mg/dL 9.0  Total Protein 6.5 - 8.1 g/dL 7.4  Total Bilirubin 0.3 - 1.2 mg/dL 0.6  Alkaline Phos 38 - 126 U/L 41  AST 15 - 41 U/L 13(L)  ALT 0 - 44 U/L 12   CBC Latest Ref Rng & Units 10/27/2020  WBC 4.0 - 10.5 K/uL 7.6  Hemoglobin 12.0 - 15.0 g/dL 11.6(L)  Hematocrit 36.0 - 46.0 % 34.9(L)  Platelets 150 - 400 K/uL 273     Assessment and plan- Patient is a 40 y.o. female with history of left lower extremity DVT in 2018 secondary to protein C deficiency on chronic anticoagulation here for routine follow-up  Patient will continue to be on lifelong anticoagulation given her history of protein C deficiency with history of left lower extremity DVT in 2018 as well as family history of thrombosis.  She was on Eliquis previously and was switched to Xarelto since January 2022 which she will continue.  Labs revealed mild anemia with a hemoglobin of 11.6 which is close to her baseline.  I will see her back in 1 year with labs.  We will continue to provide refills for Xarelto   Visit Diagnosis 1. Chronic deep vein thrombosis (DVT) of distal vein of left lower extremity ([place])   2. Current use of long term anticoagulation   3. Protein C deficiency ([place])      Dr. Randa Evens, MD, MPH [facility] [phone] 10/27/2020 11:30 AM

## 2020-10-29 ENCOUNTER — Encounter: Payer: Self-pay | Admitting: Family Medicine

## 2020-11-01 ENCOUNTER — Other Ambulatory Visit: Payer: Self-pay | Admitting: Family Medicine

## 2020-11-01 DIAGNOSIS — F411 Generalized anxiety disorder: Secondary | ICD-10-CM

## 2020-11-05 ENCOUNTER — Other Ambulatory Visit: Payer: Self-pay | Admitting: Emergency Medicine

## 2020-11-05 ENCOUNTER — Telehealth: Payer: Self-pay | Admitting: Family Medicine

## 2020-11-05 DIAGNOSIS — F411 Generalized anxiety disorder: Secondary | ICD-10-CM

## 2020-11-05 NOTE — Telephone Encounter (Signed)
Spoke patient and informed her that Dr. Ancil Boozer was out of the office but she did she her recent mychart message.  I informed patient that Dr. Ancil Boozer would like to refer her to a psychiatrist so that she can have more support.  Patient would like that. Referral order will be placed.

## 2020-11-09 NOTE — Progress Notes (Signed)
Name: Tammy Garza   MRN: [id number]    DOB: [birth year]   Date:11/10/2020       Progress Note  Subjective  Chief Complaint  Follow Up  HPI  Dysthymia/Anxiety: She lost her mother to multiple myeloma 06-26-2011 her sister to ovarian cancer 06/26/15  youngest daughter died from complications of brain cancer in 2019/06/26, followed by her father complications of heart transplant 23-Nov-2020 and father of oldest daughter died also Nov 23, 2020. She is overwhelmed, she has been going to work - to get out of th house - She states she keeps going for her oldest daughter. She feels supported at work. We placed a referral to psychiatrist but she has not received a call back yet. We referred her to Dr. Nicolasa Ducking. She has been able to take care of her personal hygiene, inability to keep house clean /starts but difficulty completing tasks. She has lack of appetite but has been trying to eat. Worked 3rd shift last night and did not drink all night, states does not like going to the break room to avoid seeing co-workers. She has lost over 15 lbs since 06/25/22   Chronic DVT, she takes medications as prescribed , Xarelto, no calf pain, no bruising . She has protein C deficiency   MGUS: under the care of hematologist .  Migraine headaches: episodes have been frequent lately, about twice a week. Last episode described as blurred vision, left temporal pain, sharp like, took Nurtec and symptoms resolved within 90 minutes. Not associated with nausea or vomiting   Patient Active Problem List   Diagnosis Date Noted   Chronic migraine without aura without status migrainosus, not intractable 05/09/2020   Family history of ovarian cancer 03/23/2020   Pelvic mass in female 03/23/2020   Abnormal mammogram 10/15/2019   History of hemorrhoidectomy    Hypocalcemia 10/13/2018   Protein C deficiency ([place]) 12/10/2016   Monoclonal gammopathy of unknown significance (MGUS) 11/30/2016   History of DVT (deep vein thrombosis) 11/02/2016   Elevated  blood protein 09/26/2016   Chronic deep vein thrombosis (DVT) of distal vein of left lower extremity ([place]) 09/17/2016   Vitamin D deficiency 07/26/2015   Thyroid cyst 70/35/0093   History of Helicobacter pylori infection 04/22/2015   Migraine with aura and without status migrainosus 04/22/2015   Shift work sleep disorder 04/22/2015    Past Surgical History:  Procedure Laterality Date   ABDOMINAL HYSTERECTOMY     ANTERIOR CRUCIATE LIGAMENT REPAIR Left    BREAST BIOPSY Left 10/07/2019   left breast calcs x clip   CHOLECYSTECTOMY     CYSTOSCOPY  12/19/2016   Procedure: CYSTOSCOPY;  Surgeon: Gae Dry, MD;  Location: [facility];  Service: Gynecology;;   ESOPHAGOGASTRODUODENOSCOPY (EGD) WITH PROPOFOL N/A 09/25/2017   Procedure: ESOPHAGOGASTRODUODENOSCOPY (EGD) WITH PROPOFOL;  Surgeon: Jonathon Bellows, MD;  Location: [facility];  Service: Gastroenterology;  Laterality: N/A;   LAPAROSCOPIC HYSTERECTOMY Bilateral 12/19/2016   Procedure: HYSTERECTOMY TOTAL LAPAROSCOPIC BILATERAL SALPINGECTOMY;  Surgeon: Gae Dry, MD;  Location: [facility];  Service: Gynecology;  Laterality: Bilateral;   PLANTAR FASCIA SURGERY  06/2019   RECTAL EXAM UNDER ANESTHESIA N/A 02/26/2019   Procedure: RECTAL EXAM UNDER ANESTHESIA, EXCISION OF ANAL SKIN TAG;  Surgeon: Fredirick Maudlin, MD;  Location: [facility];  Service: General;  Laterality: N/A;   TUBAL LIGATION      Family History  Problem Relation Age of Onset   HIV Sister    Ovarian cancer Sister 67   Migraines Sister  Asthma Daughter    Protein C deficiency Daughter    Brain cancer Daughter    Cancer Daughter    Migraines Daughter    Hypertension Maternal Grandmother    Hypertension Mother    Obesity Mother    Migraines Mother    Cancer Father 59       lung cancer   Deep vein thrombosis Father    Protein C deficiency Father    COPD Father    Congestive Heart Failure Father    Diabetes Brother    COPD Paternal Grandmother    Deep vein  thrombosis Paternal Grandmother    Protein C deficiency Daughter    Migraines Daughter    Breast cancer Neg Hx     Social History   Tobacco Use   Smoking status: Former    Packs/day: 0.25    Years: 5.00    Pack years: 1.25    Types: Cigarettes    Start date: 05/15/2002    Quit date: 05/16/2007    Years since quitting: 13.4   Smokeless tobacco: Never  Substance Use Topics   Alcohol use: Yes    Alcohol/week: 1.0 standard drink    Types: 1 Glasses of wine per week    Comment: occ wine none last 24 hrs     Current Outpatient Medications:    BD PEN NEEDLE NANO 2ND GEN 32G X 4 MM MISC, , Disp: , Rfl:    Boric Acid CRYS, Place 600 mg vaginally 2 (two) times a week., Disp: 500 g, Rfl: 5   Fremanezumab-vfrm (AJOVY) 225 MG/1.5ML SOAJ, Inject 225 mg into the skin every 30 (thirty) days., Disp: 4.5 mL, Rfl: 3   PARoxetine (PAXIL-CR) 25 MG 24 hr tablet, TAKE 1 TABLET (25 MG TOTAL) BY MOUTH DAILY., Disp: 90 tablet, Rfl: 0   rivaroxaban (XARELTO) 20 MG TABS tablet, Take 1 tablet (20 mg total) by mouth daily., Disp: 90 tablet, Rfl: 2   temazepam (RESTORIL) 15 MG capsule, Take 1 capsule (15 mg total) by mouth at bedtime as needed for sleep., Disp: 90 capsule, Rfl: 0   Vitamin D, Ergocalciferol, (DRISDOL) 1.25 MG (50000 UNIT) CAPS capsule, Take 1 capsule (50,000 Units total) by mouth every 7 (seven) days., Disp: 12 capsule, Rfl: 1   Rimegepant Sulfate (NURTEC) 75 MG TBDP, Take 1 tablet by mouth daily as needed. May repeat in 2 hours, no more than 2 in 24 hours for migraines, Disp: 16 tablet, Rfl: 1  Allergies  Allergen Reactions   Topamax [Topiramate] Other (See Comments)    Confusion , slurred speech    Effexor [Venlafaxine]     Gas and bloating     I personally reviewed active problem list, medication list, allergies, family history, social history, health maintenance with the patient/caregiver today.   ROS  Constitutional: Negative for fever or weight change.  Respiratory: Negative for  cough and shortness of breath.   Cardiovascular: Negative for chest pain or palpitations.  Gastrointestinal: Negative for abdominal pain, no bowel changes.  Musculoskeletal: Negative for gait problem or joint swelling.  Skin: Negative for rash.  Neurological: Negative for dizziness , positive for intermittent headache.  No other specific complaints in a complete review of systems (except as listed in HPI above).   Objective  Vitals:   11/10/20 1110  BP: 108/72  Pulse: 60  Resp: 16  Temp: 98.4 F (36.9 C)  TempSrc: Oral  SpO2: 97%  Weight: 169 lb (76.7 kg)  Height: 5' 6" (1.676 m)  Body mass index is 27.28 kg/m.  Physical Exam  Constitutional: Patient appears well-developed and well-nourished. No distress.  HEENT: head atraumatic, normocephalic, pupils equal and reactive to light Cardiovascular: Normal rate, regular rhythm and normal heart sounds.  No murmur heard. No BLE edema. Pulmonary/Chest: Effort normal and breath sounds normal. No respiratory distress. Abdominal: Soft.  There is no tenderness. Psychiatric: Patient has a normal mood and affect. behavior is normal. Judgment and thought content normal.   Recent Results (from the past 2160 hour(s))  COMPLETE METABOLIC PANEL WITH GFR     Status: None   Collection Time: 09/10/20  9:50 AM  Result Value Ref Range   Glucose, Bld 76 65 - 99 mg/dL    Comment: .            Fasting reference interval .    BUN 10 7 - 25 mg/dL   Creat 0.66 0.50 - 1.10 mg/dL   GFR, Est Non African American 110 > OR = 60 mL/min/1.59m   GFR, Est African American 127 > OR = 60 mL/min/1.766m  BUN/Creatinine Ratio NOT APPLICABLE 6 - 22 (calc)   Sodium 139 135 - 146 mmol/L   Potassium 5.1 3.5 - 5.3 mmol/L   Chloride 104 98 - 110 mmol/L   CO2 30 20 - 32 mmol/L   Calcium 9.8 8.6 - 10.2 mg/dL   Total Protein 7.3 6.1 - 8.1 g/dL   Albumin 4.6 3.6 - 5.1 g/dL   Globulin 2.7 1.9 - 3.7 g/dL (calc)   AG Ratio 1.7 1.0 - 2.5 (calc)   Total Bilirubin  0.5 0.2 - 1.2 mg/dL   Alkaline phosphatase (APISO) 49 31 - 125 U/L   AST 10 10 - 30 U/L   ALT 9 6 - 29 U/L  CBC with Differential/Platelet     Status: None   Collection Time: 09/10/20  9:50 AM  Result Value Ref Range   WBC 6.8 3.8 - 10.8 Thousand/uL   RBC 4.28 3.80 - 5.10 Million/uL   Hemoglobin 12.9 11.7 - 15.5 g/dL   HCT 39.8 35.0 - 45.0 %   MCV 93.0 80.0 - 100.0 fL   MCH 30.1 27.0 - 33.0 pg   MCHC 32.4 32.0 - 36.0 g/dL   RDW 11.7 11.0 - 15.0 %   Platelets 254 140 - 400 Thousand/uL   MPV 11.3 7.5 - 12.5 fL   Neutro Abs 3,332 1,500 - 7,800 cells/uL   Lymphs Abs 2,523 850 - 3,900 cells/uL   Absolute Monocytes 673 200 - 950 cells/uL   Eosinophils Absolute 231 15 - 500 cells/uL   Basophils Absolute 41 0 - 200 cells/uL   Neutrophils Relative % 49 %   Total Lymphocyte 37.1 %   Monocytes Relative 9.9 %   Eosinophils Relative 3.4 %   Basophils Relative 0.6 %  Lipid panel     Status: None   Collection Time: 09/10/20  9:50 AM  Result Value Ref Range   Cholesterol 186 <200 mg/dL   HDL 67 > OR = 50 mg/dL   Triglycerides 109 <150 mg/dL   LDL Cholesterol (Calc) 98 mg/dL (calc)    Comment: Reference range: <100 . Desirable range <100 mg/dL for primary prevention;   <70 mg/dL for patients with CHD or diabetic patients  with > or = 2 CHD risk factors. . Marland KitchenDL-C is now calculated using the Martin-Hopkins  calculation, which is a validated novel method providing  better accuracy than the Friedewald equation in the  estimation of LDL-C.  Cresenciano Genre et al. Annamaria Helling. 0177;939(03): 2061-2068  (http://education.[facility].com/faq/FAQ164)    Total CHOL/HDL Ratio 2.8 <5.0 (calc)   Non-HDL Cholesterol (Calc) 119 <130 mg/dL (calc)    Comment: For patients with diabetes plus 1 major ASCVD risk  factor, treating to a non-HDL-C goal of <100 mg/dL  (LDL-C of <70 mg/dL) is considered a therapeutic  option.   VITAMIN D 25 Hydroxy (Vit-D Deficiency, Fractures)     Status: None   Collection Time:  09/10/20  9:50 AM  Result Value Ref Range   Vit D, 25-Hydroxy 55 30 - 100 ng/mL    Comment: Vitamin D Status         25-OH Vitamin D: . Deficiency:                    <20 ng/mL Insufficiency:             20 - 29 ng/mL Optimal:                 > or = 30 ng/mL . For 25-OH Vitamin D testing on patients on  D2-supplementation and patients for whom quantitation  of D2 and D3 fractions is required, the Quest[employer]ureD(TM) 25-OH VIT D, (D2,D3), LC/MS/MS is recommended: order  code [id number] (patients >42yr). See Note 1 . Note 1 . For additional information, please refer to  http://education.[facility].com/faq/FAQ199  (This link is being provided for informational/ educational purposes only.)   Hemoglobin A1c     Status: None   Collection Time: 09/10/20  9:50 AM  Result Value Ref Range   Hgb A1c MFr Bld 4.9 <5.7 % of total Hgb    Comment: For the purpose of screening for the presence of diabetes: . <5.7%       Consistent with the absence of diabetes 5.7-6.4%    Consistent with increased risk for diabetes             (prediabetes) > or =6.5%  Consistent with diabetes . This assay result is consistent with a decreased risk of diabetes. . Currently, no consensus exists regarding use of hemoglobin A1c for diagnosis of diabetes in children. . According to American Diabetes Association (ADA) guidelines, hemoglobin A1c <7.0% represents optimal control in non-pregnant diabetic patients. Different metrics may apply to specific patient populations.  Standards of Medical Care in Diabetes(ADA). .    Mean Plasma Glucose 94 mg/dL   eAG (mmol/L) 5.2 mmol/L  Comprehensive metabolic panel     Status: Abnormal   Collection Time: 10/27/20  9:07 AM  Result Value Ref Range   Sodium 136 135 - 145 mmol/L   Potassium 3.8 3.5 - 5.1 mmol/L   Chloride 105 98 - 111 mmol/L   CO2 25 22 - 32 mmol/L   Glucose, Bld 86 70 - 99 mg/dL    Comment: Glucose reference range applies only to samples taken  after fasting for at least 8 hours.   BUN 10 6 - 20 mg/dL   Creatinine, Ser 0.64 0.44 - 1.00 mg/dL   Calcium 9.0 8.9 - 10.3 mg/dL   Total Protein 7.4 6.5 - 8.1 g/dL   Albumin 4.3 3.5 - 5.0 g/dL   AST 13 (L) 15 - 41 U/L   ALT 12 0 - 44 U/L   Alkaline Phosphatase 41 38 - 126 U/L   Total Bilirubin 0.6 0.3 - 1.2 mg/dL   GFR, Estimated >60 >60 mL/min    Comment: (NOTE) Calculated using the CKD-EPI Creatinine Equation (2021)    Anion gap 6  5 - 15    Comment: Performed at [facility], [address] [place], [place] [address]  CBC with Differential     Status: Abnormal   Collection Time: 10/27/20  9:07 AM  Result Value Ref Range   WBC 7.6 4.0 - 10.5 K/uL   RBC 3.79 (L) 3.87 - 5.11 MIL/uL   Hemoglobin 11.6 (L) 12.0 - 15.0 g/dL   HCT 34.9 (L) 36.0 - 46.0 %   MCV 92.1 80.0 - 100.0 fL   MCH 30.6 26.0 - 34.0 pg   MCHC 33.2 30.0 - 36.0 g/dL   RDW 12.1 11.5 - 15.5 %   Platelets 273 150 - 400 K/uL   nRBC 0.0 0.0 - 0.2 %   Neutrophils Relative % 42 %   Neutro Abs 3.2 1.7 - 7.7 K/uL   Lymphocytes Relative 47 %   Lymphs Abs 3.6 0.7 - 4.0 K/uL   Monocytes Relative 7 %   Monocytes Absolute 0.6 0.1 - 1.0 K/uL   Eosinophils Relative 3 %   Eosinophils Absolute 0.2 0.0 - 0.5 K/uL   Basophils Relative 1 %   Basophils Absolute 0.1 0.0 - 0.1 K/uL   Immature Granulocytes 0 %   Abs Immature Granulocytes 0.01 0.00 - 0.07 K/uL    Comment: Performed at [facility], [address], [place], [place] [address]    PHQ2/9: Depression screen [facility] 2/9 11/10/2020 09/10/2020 07/26/2020 05/27/2020 04/19/2020  Decreased Interest _0 Down, Depressed, Hopeless _1 0 1  PHQ - 2 Score _2 Altered sleeping _3 Tired, decreased energy _4 Change in appetite 3 0 0 2 3  Feeling bad or failure about yourself  0 0 0 1 1  Trouble concentrating 1 0 _5 Moving slowly or fidgety/restless 0 0 0 1 3  Suicidal thoughts 0 0 0 0 0  PHQ-9 Score _6 Difficult doing work/chores - - - Very difficult Extremely dIfficult  Some recent data might be hidden    phq 9 is positive   Fall Risk: Fall Risk  11/10/2020 09/10/2020 07/26/2020 05/27/2020 04/19/2020  Falls in the past year? 0 0 0 0 0  Number falls in past yr: 0 0 0 0 0  Injury with Fall? 0 0 0 0 0  Comment - - - - -    Functional Status Survey: Is the patient deaf or have difficulty hearing?: No Does the patient have difficulty seeing, even when wearing glasses/contacts?: No Does the patient have difficulty concentrating, remembering, or making decisions?: Yes Does the patient have difficulty walking or climbing stairs?: No Does the patient have difficulty dressing or bathing?: No Does the patient have difficulty doing errands alone such as visiting a doctor's office or shopping?: No   Assessment & Plan  1. Chronic deep vein thrombosis (DVT) of distal vein of left lower extremity (HCC)  On Xarelto   2. GAD (generalized anxiety disorder)  Referral placed for psychiatrist   3. Protein C deficiency ([place])  On Xarelto   4. Monoclonal gammopathy of unknown significance (MGUS)  Keep follow up with Dr. Janese Banks  5. Migraine with aura and without status migrainosus, not intractable  - Rimegepant Sulfate (NURTEC) 75 MG TBDP; Take 1 tablet by mouth daily as needed. May repeat in 2 hours, no more than 2 in 24 hours for migraines  Dispense: 16 tablet; Refill: 1  6. Dysthymia   Keep follow up with Dr. Nicolasa Ducking

## 2020-11-10 ENCOUNTER — Ambulatory Visit: Payer: 59 | Admitting: Family Medicine

## 2020-11-10 ENCOUNTER — Other Ambulatory Visit: Payer: Self-pay

## 2020-11-10 ENCOUNTER — Encounter: Payer: Self-pay | Admitting: Family Medicine

## 2020-11-10 VITALS — BP 108/72 | HR 60 | Temp 98.4°F | Resp 16 | Ht 66.0 in | Wt 169.0 lb

## 2020-11-10 DIAGNOSIS — D472 Monoclonal gammopathy: Secondary | ICD-10-CM

## 2020-11-10 DIAGNOSIS — G43109 Migraine with aura, not intractable, without status migrainosus: Secondary | ICD-10-CM

## 2020-11-10 DIAGNOSIS — F411 Generalized anxiety disorder: Secondary | ICD-10-CM | POA: Diagnosis not present

## 2020-11-10 DIAGNOSIS — F341 Dysthymic disorder: Secondary | ICD-10-CM

## 2020-11-10 DIAGNOSIS — I825Z2 Chronic embolism and thrombosis of unspecified deep veins of left distal lower extremity: Secondary | ICD-10-CM

## 2020-11-10 DIAGNOSIS — D6859 Other primary thrombophilia: Secondary | ICD-10-CM | POA: Diagnosis not present

## 2020-11-10 MED ORDER — NURTEC 75 MG PO TBDP: 1.0000 | ORAL_TABLET | Freq: Every day | ORAL | 1 refills | Status: AC | PRN

## 2020-11-10 NOTE — Patient Instructions (Signed)
Dr. Cephus Shelling Ph: [phone]

## 2020-11-26 ENCOUNTER — Other Ambulatory Visit: Payer: Self-pay | Admitting: Family Medicine

## 2020-11-26 ENCOUNTER — Encounter: Payer: Self-pay | Admitting: Family Medicine

## 2020-11-26 MED ORDER — TRAZODONE HCL 50 MG PO TABS
25.0000 mg | ORAL_TABLET | Freq: Every evening | ORAL | 0 refills | Status: DC | PRN
Start: 2020-11-26 — End: 2020-12-22

## 2020-12-01 ENCOUNTER — Other Ambulatory Visit: Payer: Self-pay | Admitting: Family Medicine

## 2020-12-01 DIAGNOSIS — F411 Generalized anxiety disorder: Secondary | ICD-10-CM

## 2020-12-07 ENCOUNTER — Ambulatory Visit: Payer: 59 | Admitting: Podiatry

## 2020-12-14 ENCOUNTER — Encounter: Payer: Self-pay | Admitting: Podiatry

## 2020-12-14 ENCOUNTER — Other Ambulatory Visit: Payer: Self-pay

## 2020-12-14 ENCOUNTER — Ambulatory Visit: Payer: 59 | Admitting: Podiatry

## 2020-12-14 DIAGNOSIS — M7661 Achilles tendinitis, right leg: Secondary | ICD-10-CM

## 2020-12-14 NOTE — Progress Notes (Signed)
Subjective:  Patient ID: Tammy Garza, female    DOB: 1980/06/04,  MRN: [id number]  No chief complaint on file.   40 y.o. female presents with the above complaint.  Patient presents with complaint of right Achilles tendinitis pain to the right leg.  Patient states is painful to touch.  It has gotten worse over the last few months.  She was not able to come see me because she had a lot of family commitments.  She denies any other acute complaints she has not done much for it.  She is try stretching out but it makes it hard.  She would like to discuss treatment options.  She already has a boot at home.   Review of Systems: Negative except as noted in the HPI. Denies N/V/F/Ch.  Past Medical History:  Diagnosis Date   Anemia    h/o   BRCA negative 01/2020   MyRisk neg except AXIN2 and RPS20 VUS   Cervical high risk human papillomavirus (HPV) DNA test positive    Chronic gastritis    Depression    DVT (deep venous thrombosis) ([place]) 09/2016   left leg   Family history of ovarian cancer    GERD (gastroesophageal reflux disease)    H. pylori infection    History of attempted suicide    History of gastric polyp    History of kidney stones    h/o   Hyperhidrosis    Increased risk of breast cancer 01/2020   IBIS=20.5%   Migraines    Protein C deficiency (HCC)    Rash    Shift work sleep disorder     Current Outpatient Medications:    BD PEN NEEDLE NANO 2ND GEN 32G X 4 MM MISC, , Disp: , Rfl:    Boric Acid CRYS, Place 600 mg vaginally 2 (two) times a week., Disp: 500 g, Rfl: 5   Fremanezumab-vfrm (AJOVY) 225 MG/1.5ML SOAJ, Inject 225 mg into the skin every 30 (thirty) days., Disp: 4.5 mL, Rfl: 3   PARoxetine (PAXIL-CR) 25 MG 24 hr tablet, TAKE 1 TABLET (25 MG TOTAL) BY MOUTH DAILY., Disp: 90 tablet, Rfl: 0   rivaroxaban (XARELTO) 20 MG TABS tablet, Take 1 tablet (20 mg total) by mouth daily., Disp: 90 tablet, Rfl: 2   temazepam (RESTORIL) 15 MG capsule, Take 1 capsule (15 mg total)  by mouth at bedtime as needed for sleep., Disp: 90 capsule, Rfl: 0   traZODone (DESYREL) 50 MG tablet, Take 0.5-1 tablets (25-50 mg total) by mouth at bedtime as needed for sleep., Disp: 30 tablet, Rfl: 0   Vitamin D, Ergocalciferol, (DRISDOL) 1.25 MG (50000 UNIT) CAPS capsule, Take 1 capsule (50,000 Units total) by mouth every 7 (seven) days., Disp: 12 capsule, Rfl: 1  Social History   Tobacco Use  Smoking Status Former   Packs/day: 0.25   Years: 5.00   Pack years: 1.25   Types: Cigarettes   Start date: 05/15/2002   Quit date: 05/16/2007   Years since quitting: 13.5  Smokeless Tobacco Never    Allergies  Allergen Reactions   Topamax [Topiramate] Other (See Comments)    Confusion , slurred speech    Effexor [Venlafaxine]     Gas and bloating    Objective:  There were no vitals filed for this visit. There is no height or weight on file to calculate BMI. Constitutional Well developed. Well nourished.  Vascular Dorsalis pedis pulses palpable bilaterally. Posterior tibial pulses palpable bilaterally. Capillary refill normal to all digits.  No  cyanosis or clubbing noted. Pedal hair growth normal.  Neurologic Normal speech. Oriented to person, place, and time. Epicritic sensation to light touch grossly present bilaterally.  Dermatologic Nails well groomed and normal in appearance. No open wounds. No skin lesions.  Orthopedic: Pain on palpation to the right Achilles tendon insertion.  Palpable Haglund's deformity noted.  Tight gastrocnemius equinus noted.  Positive Silfverskiold test with gastrocnemius equinus.  No pain with range of motion at the ankle joint.  Pain with dorsiflexion of the ankle joint no pain with plantarflexion of the ankle joint.  No deep intra-articular ankle pain noted.  No pain of the posterior tibial, peroneal tendon, ATFL ligament   Radiographs: None Assessment:   1. Achilles tendinitis, right leg    Plan:  Patient was evaluated and treated and all  questions answered.  Right Achilles tendinitis with Haglund's deformity and gastrocnemius equinus -I explained to the patient the etiology of Achilles tendinitis and various treatment options were discussed.  Given the amount of pain that she is having I believe she would benefit from cam boot immobilization.  She already has cam boot at home.  I have asked her to place her self in it and immobilize her foot. -If there is no improvement we will discuss steroid injection at that time.  Patient states understanding  No follow-ups on file.

## 2020-12-22 ENCOUNTER — Other Ambulatory Visit: Payer: Self-pay | Admitting: Family Medicine

## 2020-12-22 DIAGNOSIS — M79676 Pain in unspecified toe(s): Secondary | ICD-10-CM

## 2020-12-22 NOTE — Telephone Encounter (Signed)
Last seen 6.29.2022 upcoming 9.30.2022

## 2020-12-22 NOTE — Telephone Encounter (Signed)
  Notes to clinic:   : REQUEST FOR 90 DAYS PRESCRIPTION.  Requested Prescriptions  Pending Prescriptions Disp Refills   traZODone (DESYREL) 50 MG tablet [Pharmacy Med Name: TRAZODONE 50 MG TABLET] 90 tablet 1    Sig: TAKE 0.5-1 TABLETS BY MOUTH AT BEDTIME AS NEEDED FOR SLEEP.      Psychiatry: Antidepressants - Serotonin Modulator Passed - 12/22/2020  9:32 AM      Passed - Valid encounter within last 6 months    Recent Outpatient Visits           1 month ago Chronic deep vein thrombosis (DVT) of distal vein of left lower extremity [facility])   [facility] [name], MD   3 months ago Well adult exam   [facility] [name], MD   4 months ago GAD (generalized anxiety disorder)   [facility] [name], MD   6 months ago GAD (generalized anxiety disorder)   [facility] [name], MD   8 months ago GAD (generalized anxiety disorder)   [facility] [name], MD       Future Appointments             In 1 month [name], MD [facility]

## 2020-12-23 ENCOUNTER — Other Ambulatory Visit: Payer: Self-pay | Admitting: Family Medicine

## 2021-01-07 DIAGNOSIS — M79676 Pain in unspecified toe(s): Secondary | ICD-10-CM

## 2021-01-13 ENCOUNTER — Ambulatory Visit: Payer: 59 | Admitting: Podiatry

## 2021-01-13 ENCOUNTER — Encounter: Payer: Self-pay | Admitting: Podiatry

## 2021-01-13 ENCOUNTER — Other Ambulatory Visit: Payer: Self-pay

## 2021-01-13 DIAGNOSIS — M7661 Achilles tendinitis, right leg: Secondary | ICD-10-CM | POA: Diagnosis not present

## 2021-01-13 DIAGNOSIS — M722 Plantar fascial fibromatosis: Secondary | ICD-10-CM

## 2021-01-13 MED ORDER — METHYLPREDNISOLONE 4 MG PO TBPK: ORAL_TABLET | ORAL | 0 refills | Status: DC

## 2021-01-20 ENCOUNTER — Encounter: Payer: Self-pay | Admitting: Podiatry

## 2021-01-20 ENCOUNTER — Telehealth: Payer: Self-pay | Admitting: Podiatry

## 2021-01-20 DIAGNOSIS — M722 Plantar fascial fibromatosis: Secondary | ICD-10-CM

## 2021-01-20 DIAGNOSIS — M7661 Achilles tendinitis, right leg: Secondary | ICD-10-CM

## 2021-01-20 NOTE — Telephone Encounter (Signed)
Patient came in today asking about her MRI. Can you please order an MRI for pt.

## 2021-01-20 NOTE — Progress Notes (Signed)
Subjective:  Patient ID: Tammy Garza, female    DOB: July 01, 1980,  MRN: [id number]  Chief Complaint  Patient presents with   Foot Pain    PT stated that she is still having some pain in the achilles area      40 y.o. female presents with the above complaint.  Patient presents with follow-up of right Achilles tendinitis and now a new complaint of right Planter fasciitis.  She states the boot does help however she still has residual pain.  She is still not transitioning out of the boot.  She denies any other acute complaint she would like to discuss next treatment plan.  Review of Systems: Negative except as noted in the HPI. Denies N/V/F/Ch.  Past Medical History:  Diagnosis Date   Anemia    h/o   BRCA negative 01/2020   MyRisk neg except AXIN2 and RPS20 VUS   Cervical high risk human papillomavirus (HPV) DNA test positive    Chronic gastritis    Depression    DVT (deep venous thrombosis) ([place]) 09/2016   left leg   Family history of ovarian cancer    GERD (gastroesophageal reflux disease)    H. pylori infection    History of attempted suicide    History of gastric polyp    History of kidney stones    h/o   Hyperhidrosis    Increased risk of breast cancer 01/2020   IBIS=20.5%   Migraines    Protein C deficiency (HCC)    Rash    Shift work sleep disorder     Current Outpatient Medications:    methylPREDNISolone (MEDROL DOSEPAK) 4 MG TBPK tablet, Take as directed, Disp: 21 each, Rfl: 0   BD PEN NEEDLE NANO 2ND GEN 32G X 4 MM MISC, , Disp: , Rfl:    Boric Acid CRYS, Place 600 mg vaginally 2 (two) times a week., Disp: 500 g, Rfl: 5   Fremanezumab-vfrm (AJOVY) 225 MG/1.5ML SOAJ, Inject 225 mg into the skin every 30 (thirty) days., Disp: 4.5 mL, Rfl: 3   PARoxetine (PAXIL-CR) 25 MG 24 hr tablet, TAKE 1 TABLET (25 MG TOTAL) BY MOUTH DAILY., Disp: 90 tablet, Rfl: 0   rivaroxaban (XARELTO) 20 MG TABS tablet, Take 1 tablet (20 mg total) by mouth daily., Disp: 90 tablet, Rfl: 2    temazepam (RESTORIL) 15 MG capsule, Take 1 capsule (15 mg total) by mouth at bedtime as needed for sleep., Disp: 90 capsule, Rfl: 0   traZODone (DESYREL) 50 MG tablet, TAKE 0.5-1 TABLETS BY MOUTH AT BEDTIME AS NEEDED FOR SLEEP., Disp: 90 tablet, Rfl: 0   Vitamin D, Ergocalciferol, (DRISDOL) 1.25 MG (50000 UNIT) CAPS capsule, Take 1 capsule (50,000 Units total) by mouth every 7 (seven) days., Disp: 12 capsule, Rfl: 1  Social History   Tobacco Use  Smoking Status Former   Packs/day: 0.25   Years: 5.00   Pack years: 1.25   Types: Cigarettes   Start date: 05/15/2002   Quit date: 05/16/2007   Years since quitting: 13.6  Smokeless Tobacco Never    Allergies  Allergen Reactions   Topamax [Topiramate] Other (See Comments)    Confusion , slurred speech    Effexor [Venlafaxine]     Gas and bloating    Objective:  There were no vitals filed for this visit. There is no height or weight on file to calculate BMI. Constitutional Well developed. Well nourished.  Vascular Dorsalis pedis pulses palpable bilaterally. Posterior tibial pulses palpable bilaterally. Capillary refill normal to  all digits.  No cyanosis or clubbing noted. Pedal hair growth normal.  Neurologic Normal speech. Oriented to person, place, and time. Epicritic sensation to light touch grossly present bilaterally.  Dermatologic Nails well groomed and normal in appearance. No open wounds. No skin lesions.  Orthopedic: Pain on palpation to the right Achilles tendon insertion.  Palpable Haglund's deformity noted.  Tight gastrocnemius equinus noted.  Positive Silfverskiold test with gastrocnemius equinus.  No pain with range of motion at the ankle joint.  Pain with dorsiflexion of the ankle joint no pain with plantarflexion of the ankle joint.  No deep intra-articular ankle pain noted.  No pain of the posterior tibial, peroneal tendon, ATFL ligament  Pain on palpation to the right plantar foot at the medial calcaneal tuber.  Tight  plantar fascia noted.   Radiographs: None Assessment:   1. Achilles tendinitis, right leg   2. Plantar fasciitis, right     Plan:  Patient was evaluated and treated and all questions answered.  Right Achilles tendinitis with Haglund's deformity and gastrocnemius equinus -I explained to the patient the etiology of Achilles tendinitis and various treatment options were discussed.   -Continue cam boot immobilization -Given that she still has residual pain I believe she will benefit from steroid injection.  I discussed with her that given the risk of tendon rupture associated with it we will plan on doing 1 injection.  She states understanding and would like to proceed with a steroid injection. -A steroid injection was performed at right Kager's fat pad using 1% plain Lidocaine and 10 mg of Kenalog. This was well tolerated.  Right Planter fasciitis with underlying foot capsulitis -I explained to the patient the etiology of Planter fasciitis and various treatment options were extensively discussed.  She may be compensating and aggravating her plantar fascial.  She has started to have pain there.  I discussed with her she could benefit from steroid injection in the heel.  She states understand like to proceed with a steroid injection in the plantar calcaneal tuber -A steroid injection was performed at right plantar heel using 1% plain Lidocaine and 10 mg of Kenalog. This was well tolerated.    No follow-ups on file.

## 2021-01-29 ENCOUNTER — Ambulatory Visit
Admission: RE | Admit: 2021-01-29 | Discharge: 2021-01-29 | Disposition: A | Discharge status: Home / Self Care | Payer: 59 | Source: Ambulatory Visit | Attending: Podiatry | Admitting: Podiatry

## 2021-01-29 DIAGNOSIS — M7661 Achilles tendinitis, right leg: Secondary | ICD-10-CM

## 2021-01-29 DIAGNOSIS — M722 Plantar fascial fibromatosis: Secondary | ICD-10-CM

## 2021-01-30 ENCOUNTER — Other Ambulatory Visit: Payer: Self-pay | Admitting: Family Medicine

## 2021-01-30 ENCOUNTER — Other Ambulatory Visit: Payer: Self-pay | Admitting: Obstetrics & Gynecology

## 2021-01-30 DIAGNOSIS — E559 Vitamin D deficiency, unspecified: Secondary | ICD-10-CM

## 2021-02-01 ENCOUNTER — Other Ambulatory Visit: Payer: 59

## 2021-02-07 ENCOUNTER — Encounter: Payer: Self-pay | Admitting: Obstetrics & Gynecology

## 2021-02-07 ENCOUNTER — Other Ambulatory Visit: Payer: Self-pay

## 2021-02-07 ENCOUNTER — Ambulatory Visit (INDEPENDENT_AMBULATORY_CARE_PROVIDER_SITE_OTHER): Payer: 59 | Admitting: Obstetrics & Gynecology

## 2021-02-07 VITALS — BP 120/80 | Ht 66.0 in | Wt 170.0 lb

## 2021-02-07 DIAGNOSIS — Z780 Asymptomatic menopausal state: Secondary | ICD-10-CM | POA: Diagnosis not present

## 2021-02-07 DIAGNOSIS — Z01419 Encounter for gynecological examination (general) (routine) without abnormal findings: Secondary | ICD-10-CM | POA: Diagnosis not present

## 2021-02-07 MED ORDER — ESTROGENS CONJUGATED 0.625 MG PO TABS: 0.6250 mg | ORAL_TABLET | Freq: Every day | ORAL | 3 refills | Status: DC

## 2021-02-07 NOTE — Progress Notes (Signed)
HPI:      [name] is a 40 y.o. [id number] who LMP was in the past (prior TLH), she presents today for her annual examination.  The patient has no complaints today other than hot flashes, sweats, vag dryness, some bladder urgency. The patient is sexually active. Herlast pap: approximate date 2018 and was normal and last mammogram: approximate date 2022 and was normal.  The patient does perform self breast exams.  There is no notable family history of breast or ovarian cancer in her family. The patient is not taking hormone replacement therapy. Patient denies post-menopausal vaginal bleeding.   The patient has regular exercise: yes. The patient denies current symptoms of depression.     PMHx: Past Medical History:  Diagnosis Date   Anemia    h/o   BRCA negative 01/2020   MyRisk neg except AXIN2 and RPS20 VUS   Cervical high risk human papillomavirus (HPV) DNA test positive    Chronic gastritis    Depression    DVT (deep venous thrombosis) ([place]) 09/2016   left leg   Family history of ovarian cancer    GERD (gastroesophageal reflux disease)    H. pylori infection    History of attempted suicide    History of gastric polyp    History of kidney stones    h/o   Hyperhidrosis    Increased risk of breast cancer 01/2020   IBIS=20.5%   Migraines    Protein C deficiency ([place])    Rash    Shift work sleep disorder    Past Surgical History:  Procedure Laterality Date   ABDOMINAL HYSTERECTOMY     ANTERIOR CRUCIATE LIGAMENT REPAIR Left    BREAST BIOPSY Left 10/07/2019   left breast calcs x clip   CHOLECYSTECTOMY     CYSTOSCOPY  12/19/2016   Procedure: CYSTOSCOPY;  Surgeon: Gae Dry, MD;  Location: [facility] ORS;  Service: Gynecology;;   ESOPHAGOGASTRODUODENOSCOPY (EGD) WITH PROPOFOL N/A 09/25/2017   Procedure: ESOPHAGOGASTRODUODENOSCOPY (EGD) WITH PROPOFOL;  Surgeon: Jonathon Bellows, MD;  Location: [facility] ENDOSCOPY;  Service: Gastroenterology;  Laterality: N/A;   LAPAROSCOPIC HYSTERECTOMY  Bilateral 12/19/2016   Procedure: HYSTERECTOMY TOTAL LAPAROSCOPIC BILATERAL SALPINGECTOMY;  Surgeon: Gae Dry, MD;  Location: [facility] ORS;  Service: Gynecology;  Laterality: Bilateral;   PLANTAR FASCIA SURGERY  06/2019   RECTAL EXAM UNDER ANESTHESIA N/A 02/26/2019   Procedure: RECTAL EXAM UNDER ANESTHESIA, EXCISION OF ANAL SKIN TAG;  Surgeon: Fredirick Maudlin, MD;  Location: [facility] ORS;  Service: General;  Laterality: N/A;   TUBAL LIGATION     Family History  Problem Relation Age of Onset   HIV Sister    Ovarian cancer Sister 62   Migraines Sister    Asthma Daughter    Protein C deficiency Daughter    Brain cancer Daughter    Cancer Daughter    Migraines Daughter    Hypertension Maternal Grandmother    Hypertension Mother    Obesity Mother    Migraines Mother    Cancer Father 27       lung cancer   Deep vein thrombosis Father    Protein C deficiency Father    COPD Father    Congestive Heart Failure Father    Diabetes Brother    COPD Paternal Grandmother    Deep vein thrombosis Paternal Grandmother    Protein C deficiency Daughter    Migraines Daughter    Breast cancer Neg Hx    Social History   Tobacco Use  Smoking status: Former    Packs/day: 0.25    Years: 5.00    Pack years: 1.25    Types: Cigarettes    Start date: 05/15/2002    Quit date: 05/16/2007    Years since quitting: 13.7   Smokeless tobacco: Never  Vaping Use   Vaping Use: Never used  Substance Use Topics   Alcohol use: Yes    Alcohol/week: 1.0 standard drink    Types: 1 Glasses of wine per week    Comment: occ wine none last 24 hrs   Drug use: Not Currently    Types: Marijuana    Current Outpatient Medications:    Boric Acid CRYS, Place 600 mg vaginally 2 (two) times a week., Disp: 500 g, Rfl: 5   estrogens, conjugated, (PREMARIN) 0.625 MG tablet, Take 1 tablet (0.625 mg total) by mouth daily., Disp: 90 tablet, Rfl: 3   Fremanezumab-vfrm (AJOVY) 225 MG/1.5ML SOAJ, Inject 225 mg into the skin  every 30 (thirty) days., Disp: 4.5 mL, Rfl: 3   temazepam (RESTORIL) 15 MG capsule, Take 1 capsule (15 mg total) by mouth at bedtime as needed for sleep., Disp: 90 capsule, Rfl: 0   traZODone (DESYREL) 50 MG tablet, TAKE 0.5-1 TABLETS BY MOUTH AT BEDTIME AS NEEDED FOR SLEEP., Disp: 90 tablet, Rfl: 0   Vitamin D, Ergocalciferol, (DRISDOL) 1.25 MG (50000 UNIT) CAPS capsule, TAKE 1 CAPSULE (50,000 UNITS TOTAL) BY MOUTH EVERY 7 (SEVEN) DAYS, Disp: 12 capsule, Rfl: 1   BD PEN NEEDLE NANO 2ND GEN 32G X 4 MM MISC, , Disp: , Rfl:    FLUoxetine (PROZAC) 10 MG capsule, Take 10 mg by mouth daily., Disp: , Rfl:    rivaroxaban (XARELTO) 20 MG TABS tablet, Take 1 tablet (20 mg total) by mouth daily., Disp: 90 tablet, Rfl: 2 Allergies: Topamax [topiramate] and Effexor [venlafaxine]  Review of Systems  Constitutional:  Negative for chills, fever and malaise/fatigue.  HENT:  Negative for congestion, sinus pain and sore throat.   Eyes:  Negative for blurred vision and pain.  Respiratory:  Negative for cough and wheezing.   Cardiovascular:  Negative for chest pain and leg swelling.  Gastrointestinal:  Negative for abdominal pain, constipation, diarrhea, heartburn, nausea and vomiting.  Genitourinary:  Negative for dysuria, frequency, hematuria and urgency.  Musculoskeletal:  Negative for back pain, joint pain, myalgias and neck pain.  Skin:  Negative for itching and rash.  Neurological:  Negative for dizziness, tremors and weakness.  Endo/Heme/Allergies:  Does not bruise/bleed easily.  Psychiatric/Behavioral:  Negative for depression. The patient is not nervous/anxious and does not have insomnia.    Objective: BP 120/80   Ht 5' 6"  (1.676 m)   Wt 170 lb (77.1 kg)   LMP 01/04/2016 Comment: partial hysterectomy, tubal   BMI 27.44 kg/m   Filed Weights   02/07/21 1331  Weight: 170 lb (77.1 kg)   Body mass index is 27.44 kg/m. Physical Exam Constitutional:      General: She is not in acute distress.     Appearance: She is well-developed.  Genitourinary:     Vulva, bladder, rectum and urethral meatus normal.     No lesions in the vagina.     Genitourinary Comments: Vaginal cuff well healed     Right Labia: No rash, tenderness or lesions.    Left Labia: No tenderness, lesions or rash.    No vaginal bleeding.      Right Adnexa: not tender and no mass present.    Left Adnexa:  not tender and no mass present.    Cervix is absent.     Uterus is absent.     Pelvic exam was performed with patient in the lithotomy position.  Breasts:    Right: No mass, skin change or tenderness.     Left: No mass, skin change or tenderness.  HENT:     Head: Normocephalic and atraumatic. No laceration.     Right Ear: Hearing normal.     Left Ear: Hearing normal.     Mouth/Throat:     Pharynx: Uvula midline.  Eyes:     Pupils: Pupils are equal, round, and reactive to light.  Neck:     Thyroid: No thyromegaly.  Cardiovascular:     Rate and Rhythm: Normal rate and regular rhythm.     Heart sounds: No murmur heard.   No friction rub. No gallop.  Pulmonary:     Effort: Pulmonary effort is normal. No respiratory distress.     Breath sounds: Normal breath sounds. No wheezing.  Abdominal:     General: Bowel sounds are normal. There is no distension.     Palpations: Abdomen is soft.     Tenderness: There is no abdominal tenderness. There is no rebound.  Musculoskeletal:        General: Normal range of motion.     Cervical back: Normal range of motion and neck supple.  Neurological:     Mental Status: She is alert and oriented to person, place, and time.     Cranial Nerves: No cranial nerve deficit.  Skin:    General: Skin is warm and dry.  Psychiatric:        Judgment: Judgment normal.  Vitals reviewed.    Assessment: Annual Exam 1. Women's annual routine gynecological examination   2. Menopause     Plan:            1.  Vaginal Screening-  Pap smear schedule reviewed with patient, due  2023  2. Breast screening- Exam annually and mammogram scheduled  3. Colonoscopy every 10 years, Hemoccult testing after age 87  4. Labs managed by PCP  5. Counseling for hormonal therapy: wants to change HRT or dose due to hot flashes and vaginal dryness Premarin 0.625 mg daily Rx, Info HRT I have discussed HRT with the patient in detail.  The risk/benefits of it were reviewed.  She understands that during menopause Estrogen decreases dramatically and that this results in an increased risk of cardiovascular disease as well as osteoporosis.  We have also discussed the fact that hot flashes often result from a decrease in Estrogen, and that by replacing Estrogen, they can often be alleviated.  We have discussed skin, vaginal and urinary tract changes that may also take place from this drop in Estrogen.  Emotional changes have also been linked to Estrogen and we have briefly discussed this.  The benefits of HRT including decrease in hot flashes, vaginal dryness, and osteoporosis were discussed.  The emotional benefit and a possible change in her cardiovascular risk profile was also reviewed.  The risks associated with Hormone Replacement Therapy were also reviewed.  The use of unopposed Estrogen and its relationship to endometrial cancer was discussed.  The addition of Progesterone and its beneficial effect on endometrial cancer was also noted.  The fact that there has been no consistent definitive studies showing an increase in breast cancer in women who use HRT was discussed with the patient.  The possible side effects including breast tenderness,  fluid retention, mood changes and vaginal bleeding were discussed.  The patient was informed that this is an elective medication and that she may choose not to take Hormone Replacement Therapy.  Literature on HRT was given, and I believe that after answering all of the patient's questions, she has an adequate and informed understanding of HRT.  Special emphasis  on the WHI study, as well as several studies since that pertaining to the risks and benefits of estrogen replacement therapy were compared.  The possible limitations of these studies were discussed including the age stratification of the WHI study.  The possible role of Progesterone in these studies was discussed in detail.  I believe that the patient has an informed knowledge of the risks and benefits of HRT.  I have specifically discussed WHI findings and current updates.  Different type of hormone formulation and methods of taking hormone replacement therapy discussed.      F/U  Return in about 2 months (around 04/09/2021) for Follow up tele.  Barnett Applebaum, MD, [facility], [facility] 02/07/2021  2:22 PM

## 2021-02-07 NOTE — Patient Instructions (Signed)
Conjugated Estrogens tablets What is this medication? CONJUGATED ESTROGENS (CON ju gate ed ESS troe jenz) is an estrogen. It is used as hormone replacement in menopausal women. It helps to treat hot flashes and prevent osteoporosis. It is also used to treat women with low hormone levels or in those who have had their ovaries removed. This medicine may be used for other purposes; ask your health care provider or pharmacist if you have questions. COMMON BRAND NAME(S): Premarin What should I tell my care team before I take this medication? They need to know if you have any of these conditions: abnormal vaginal bleeding blood vessel disease or blood clots breast, cervical, endometrial, ovarian, liver, or uterine cancer dementia diabetes endometriosis fibroids gallbladder disease heart disease or recent heart attack high blood pressure high cholesterol high level of calcium in the blood kidney disease liver disease mental depression migraine headaches protein C deficiency protein S deficiency stroke tobacco smoker an unusual or allergic reaction to estrogens, other medicines, foods, dyes, or preservatives pregnant or trying to get pregnant breast-feeding How should I use this medication? Take this medicine by mouth with a glass of water. Follow the directions on the prescription label. Take your medicine at regular intervals, at the same time each day. Do not take your medicine more often than directed. A patient package insert for the product will be given with each prescription and refill. Read this sheet carefully each time. Talk to your pediatrician regarding the use of this medicine in children. This medicine is not approved for use in children. Overdosage: If you think you have taken too much of this medicine contact a poison control center or emergency room at once. NOTE: This medicine is only for you. Do not share this medicine with others. What if I miss a dose? If you miss a  dose, take it as soon as you can. If it is almost time for your next dose, take only that dose. Do not take double or extra doses. What may interact with this medication? Do not take this medicine with any of the following medications: aromatase inhibitors like aminoglutethimide, anastrozole, exemestane, letrozole, testolactone metyrapone This medicine may also interact with the following medications: barbiturates, such as phenobarbital carbamazepine clarithromycin erythromycin grapefruit juice medicines for fungal infections like ketoconazole and itraconazole phenytoin rifampin ritonavir St. John's Wort thyroid hormones This list may not describe all possible interactions. Give your health care provider a list of all the medicines, herbs, non-prescription drugs, or dietary supplements you use. Also tell them if you smoke, drink alcohol, or use illegal drugs. Some items may interact with your medicine. What should I watch for while using this medication? Visit your health care professional for regular checks on your progress. You will need a regular breast and pelvic exam and Pap smear while on this medicine. You should also discuss the need for regular mammograms with your health care professional, and follow his or her guidelines for these tests. This medicine can make your body retain fluid, making your fingers, hands, or ankles swell. Your blood pressure can go up. Contact your doctor or health care professional if you feel you are retaining fluid. If you have any reason to think you are pregnant; stop taking this medicine at once and contact your doctor or health care professional. Smoking increases the risk of getting a blood clot or having a stroke while you are taking this medicine, especially if you are more than 41 years old. You are strongly advised not to  smoke. If you wear contact lenses and notice visual changes, or if the lenses begin to feel uncomfortable, consult your eye care  specialist. The tablet shell for some brands of this medicine does not dissolve. The tablet shell may appear whole in the stool. This is not a cause for concern. If you see something that resembles a tablet in your stool, talk to your healthcare provider. This medicine can increase the risk of developing a condition (endometrial hyperplasia) that may lead to cancer of the lining of the uterus. Taking progestins, another hormone drug, with this medicine lowers the risk of developing this condition. Therefore, if your uterus has not been removed (by a hysterectomy), your doctor may prescribe a progestin for you to take together with your estrogen. You should know, however, that taking estrogens with progestins may have additional health risks. You should discuss the use of estrogens and progestins with your health care professional to determine the benefits and risks for you. If you are going to have surgery, you may need to stop taking this medicine. Consult your health care professional for advice before you schedule the surgery. What side effects may I notice from receiving this medication? Side effects that you should report to your doctor or health care professional as soon as possible: allergic reactions like skin rash, itching or hives, swelling of the face, lips, or tongue breast tissue changes or discharge changes in vision chest pain confusion, trouble speaking or understanding dark urine general ill feeling or flu-like symptoms light-colored stools nausea, vomiting pain, swelling, warmth in the leg right upper belly pain severe headaches shortness of breath sudden numbness or weakness of the face, arm or leg trouble walking, dizziness, loss of balance or coordination unusual vaginal bleeding yellowing of the eyes or skin Side effects that usually do not require medical attention (report to your doctor or health care professional if they continue or are bothersome): hair  loss increased hunger or thirst increased urination symptoms of vaginal infection like itching, irritation or unusual discharge unusually weak or tired This list may not describe all possible side effects. Call your doctor for medical advice about side effects. You may report side effects to FDA at 1-800-FDA-1088. Where should I keep my medication? Keep out of the reach of children. Store at room temperature between 15 and 30 degrees C (59 and 86 degrees F). Throw away any unused medicine after the expiration date. NOTE: This sheet is a summary. It may not cover all possible information. If you have questions about this medicine, talk to your doctor, pharmacist, or health care provider.  2022 Elsevier/Gold Standard (2010-08-03 09:20:56)

## 2021-02-09 ENCOUNTER — Encounter: Payer: Self-pay | Admitting: General Surgery

## 2021-02-10 NOTE — Progress Notes (Signed)
Name: Tammy Garza   MRN: [id number]    DOB: Nov 11, 1980   Date:02/11/2021       Progress Note  Subjective  Chief Complaint  Follow Up  HPI  Dysthymia/Anxiety: She lost her mother to multiple myeloma 07-22-11 her sister to ovarian cancer Jul 22, 2015  youngest daughter died from complications of brain cancer in Jul 22, 2019, followed by her father complications of heart transplant 11-18-20 and father of oldest daughter died also 11-18-2020.  Daughter died  09-18-20. She is seeing Dr. Arlyn Leak psychiatrist . Appetite is better, weight is trending up, she had lost 15 lbs without trying. She states she is able to fall asleep with medication but not able to stay asleep.   Chronic DVT, she takes medications as prescribed , Xarelto, no calf pain, no bruising . She has protein C deficiency . She is compliant with medication and denies side effects   MGUS: under the care of hematologist . Unchanged   Achilles tendinitis right: seen by Dr. Posey Pronto, wore a boot and had two steroid injections, feeling better, off the boot, and has follow up soon. Out of work for the past 8 weeks.   Migraine headaches: she is using Ajovy and Nurtec prn now, frequency is down to about 3-4 episodes per month. She states starts with blurred , nausea, phonophobia and photophobia. She is under the care of Dr. Jaynee Eagles   Patient Active Problem List   Diagnosis Date Noted   Chronic migraine without aura without status migrainosus, not intractable 05/09/2020   Family history of ovarian cancer 03/23/2020   Pelvic mass in female 03/23/2020   Abnormal mammogram 10/15/2019   History of hemorrhoidectomy    Hypocalcemia 10/13/2018   Protein C deficiency ([place]) 12/10/2016   Monoclonal gammopathy of unknown significance (MGUS) 11/30/2016   History of DVT (deep vein thrombosis) 11/02/2016   Elevated blood protein 09/26/2016   Chronic deep vein thrombosis (DVT) of distal vein of left lower extremity ([place]) 09/17/2016   Vitamin D deficiency 07/26/2015    Thyroid cyst 02/18/1218   History of Helicobacter pylori infection 04/22/2015   Migraine with aura and without status migrainosus 04/22/2015   Shift work sleep disorder 04/22/2015    Past Surgical History:  Procedure Laterality Date   ABDOMINAL HYSTERECTOMY     ANTERIOR CRUCIATE LIGAMENT REPAIR Left    BREAST BIOPSY Left 10/07/2019   left breast calcs x clip   CHOLECYSTECTOMY     CYSTOSCOPY  12/19/2016   Procedure: CYSTOSCOPY;  Surgeon: Gae Dry, MD;  Location: [facility] ORS;  Service: Gynecology;;   ESOPHAGOGASTRODUODENOSCOPY (EGD) WITH PROPOFOL N/A 09/25/2017   Procedure: ESOPHAGOGASTRODUODENOSCOPY (EGD) WITH PROPOFOL;  Surgeon: Jonathon Bellows, MD;  Location: [facility] ENDOSCOPY;  Service: Gastroenterology;  Laterality: N/A;   LAPAROSCOPIC HYSTERECTOMY Bilateral 12/19/2016   Procedure: HYSTERECTOMY TOTAL LAPAROSCOPIC BILATERAL SALPINGECTOMY;  Surgeon: Gae Dry, MD;  Location: [facility] OR[facility];  Service: Gynecology;  Laterality: Bilateral;   PLANTAR FASCIA SURGERY  2019-07-22   RECTAL EXAM UNDER ANESTHESIA N/A 02/26/2019   Procedure: RECTAL EXAM UNDER ANESTHESIA, EXCISION OF ANAL SKIN TAG;  Surgeon: Fredirick Maudlin, MD;  Location: [facility] ORS;  Service: General;  Laterality: N/A;   TUBAL LIGATION      Family History  Problem Relation Age of Onset   HIV Sister    Ovarian cancer Sister 24   Migraines Sister    Asthma Daughter    Protein C deficiency Daughter    Brain cancer Daughter    Cancer Daughter    Migraines  Daughter    Hypertension Maternal Grandmother    Hypertension Mother    Obesity Mother    Migraines Mother    Cancer Father 69       lung cancer   Deep vein thrombosis Father    Protein C deficiency Father    COPD Father    Congestive Heart Failure Father    Diabetes Brother    COPD Paternal Grandmother    Deep vein thrombosis Paternal Grandmother    Protein C deficiency Daughter    Migraines Daughter    Breast cancer Neg Hx     Social History   Tobacco Use   Smoking  status: Former    Packs/day: 0.25    Years: 5.00    Pack years: 1.25    Types: Cigarettes    Start date: 05/15/2002    Quit date: 05/16/2007    Years since quitting: 13.7   Smokeless tobacco: Never  Substance Use Topics   Alcohol use: Yes    Alcohol/week: 1.0 standard drink    Types: 1 Glasses of wine per week    Comment: occ wine none last 24 hrs     Current Outpatient Medications:    Boric Acid CRYS, Place 600 mg vaginally 2 (two) times a week., Disp: 500 g, Rfl: 5   estrogens, conjugated, (PREMARIN) 0.625 MG tablet, Take 1 tablet (0.625 mg total) by mouth daily., Disp: 90 tablet, Rfl: 3   FLUoxetine (PROZAC) 10 MG capsule, Take 10 mg by mouth daily., Disp: , Rfl:    Fremanezumab-vfrm (AJOVY) 225 MG/1.5ML SOAJ, Inject 225 mg into the skin every 30 (thirty) days., Disp: 4.5 mL, Rfl: 3   temazepam (RESTORIL) 15 MG capsule, Take 1 capsule (15 mg total) by mouth at bedtime as needed for sleep., Disp: 90 capsule, Rfl: 0   traZODone (DESYREL) 50 MG tablet, TAKE 0.5-1 TABLETS BY MOUTH AT BEDTIME AS NEEDED FOR SLEEP., Disp: 90 tablet, Rfl: 0   Vitamin D, Ergocalciferol, (DRISDOL) 1.25 MG (50000 UNIT) CAPS capsule, TAKE 1 CAPSULE (50,000 UNITS TOTAL) BY MOUTH EVERY 7 (SEVEN) DAYS, Disp: 12 capsule, Rfl: 1   rivaroxaban (XARELTO) 20 MG TABS tablet, Take 1 tablet (20 mg total) by mouth daily., Disp: 90 tablet, Rfl: 2  Allergies  Allergen Reactions   Topamax [Topiramate] Other (See Comments)    Confusion , slurred speech    Effexor [Venlafaxine]     Gas and bloating     I personally reviewed active problem list, medication list, allergies, family history, social history, health maintenance with the patient/caregiver today.   ROS  Constitutional: Negative for fever or weight change.  Respiratory: Negative for cough and shortness of breath.   Cardiovascular: Negative for chest pain or palpitations.  Gastrointestinal: Negative for abdominal pain, no bowel changes.  Musculoskeletal:  Negative for gait problem or joint swelling.  Skin: Negative for rash.  Neurological: Negative for dizziness or headache.  No other specific complaints in a complete review of systems (except as listed in HPI above).   Objective  Vitals:   02/11/21 1036  BP: 116/70  Pulse: 79  Resp: 16  Temp: 98.4 F (36.9 C)  SpO2: 98%  Weight: 173 lb (78.5 kg)  Height: 5' 6"  (1.676 m)    Body mass index is 27.92 kg/m.  Physical Exam  Constitutional: Patient appears well-developed and well-nourished. No distress.  HEENT: head atraumatic, normocephalic, pupils equal and reactive to light, neck supple Cardiovascular: Normal rate, regular rhythm and normal heart sounds.  No murmur  heard. No BLE edema. Pulmonary/Chest: Effort normal and breath sounds normal. No respiratory distress. Abdominal: Soft.  There is no tenderness. Psychiatric: Patient has a normal mood and affect. behavior is normal. Judgment and thought content normal.  Muscular Skeletal: normal rom of right ankle.   PHQ2/9: Depression screen [facility] 2/9 02/11/2021 11/10/2020 09/10/2020 07/26/2020 05/27/2020  Decreased Interest 2 2 2 3 3   Down, Depressed, Hopeless 1 1 2 2  0  PHQ - 2 Score 3 3 4 5 3   Altered sleeping 3 3 1 1 3   Tired, decreased energy 3 3 1 3 2   Change in appetite 0 3 0 0 2  Feeling bad or failure about yourself  0 0 0 0 1  Trouble concentrating 3 1 0 1 2  Moving slowly or fidgety/restless 0 0 0 0 1  Suicidal thoughts 0 0 0 0 0  PHQ-9 Score 12 13 6 10 14   Difficult doing work/chores - - - - Very difficult  Some recent data might be hidden    phq 9 is positive   Fall Risk: Fall Risk  02/11/2021 11/10/2020 09/10/2020 07/26/2020 05/27/2020  Falls in the past year? 0 0 0 0 0  Number falls in past yr: 0 0 0 0 0  Injury with Fall? 0 0 0 0 0  Comment - - - - -  Risk for fall due to : No Fall Risks - - - -  Follow up Falls prevention discussed - - - -      Functional Status Survey: Is the patient deaf or have  difficulty hearing?: Yes Does the patient have difficulty seeing, even when wearing glasses/contacts?: No Does the patient have difficulty concentrating, remembering, or making decisions?: Yes Does the patient have difficulty walking or climbing stairs?: No Does the patient have difficulty dressing or bathing?: No Does the patient have difficulty doing errands alone such as visiting a doctor's office or shopping?: No    Assessment & Plan  1. Chronic deep vein thrombosis (DVT) of distal vein of left lower extremity (HCC)  Taking Xarelto   2. Protein C deficiency ([place])  Under th care of hematologist  3. GAD (generalized anxiety disorder)   4. Migraine with aura and without status migrainosus, not intractable  Continue follow up with neurologist   5. Monoclonal gammopathy of unknown significance (MGUS)   6. Dysthymia  Seeing psychatrist   7. Insomnia, unspecified type   8. Vitamin D deficiency   9. Right Achilles tendinitis  Doing better

## 2021-02-11 ENCOUNTER — Ambulatory Visit: Payer: 59 | Admitting: Family Medicine

## 2021-02-11 ENCOUNTER — Other Ambulatory Visit: Payer: Self-pay

## 2021-02-11 ENCOUNTER — Encounter: Payer: Self-pay | Admitting: Family Medicine

## 2021-02-11 VITALS — BP 116/70 | HR 79 | Temp 98.4°F | Resp 16 | Ht 66.0 in | Wt 173.0 lb

## 2021-02-11 DIAGNOSIS — I825Z2 Chronic embolism and thrombosis of unspecified deep veins of left distal lower extremity: Secondary | ICD-10-CM

## 2021-02-11 DIAGNOSIS — F411 Generalized anxiety disorder: Secondary | ICD-10-CM

## 2021-02-11 DIAGNOSIS — G43109 Migraine with aura, not intractable, without status migrainosus: Secondary | ICD-10-CM | POA: Diagnosis not present

## 2021-02-11 DIAGNOSIS — G4700 Insomnia, unspecified: Secondary | ICD-10-CM

## 2021-02-11 DIAGNOSIS — D6859 Other primary thrombophilia: Secondary | ICD-10-CM

## 2021-02-11 DIAGNOSIS — D472 Monoclonal gammopathy: Secondary | ICD-10-CM

## 2021-02-11 DIAGNOSIS — F341 Dysthymic disorder: Secondary | ICD-10-CM

## 2021-02-11 DIAGNOSIS — E559 Vitamin D deficiency, unspecified: Secondary | ICD-10-CM

## 2021-02-11 DIAGNOSIS — M7661 Achilles tendinitis, right leg: Secondary | ICD-10-CM

## 2021-02-11 MED ORDER — TEMAZEPAM 15 MG PO CAPS: 15.0000 mg | ORAL_CAPSULE | Freq: Every evening | ORAL | 0 refills | Status: DC | PRN

## 2021-02-15 ENCOUNTER — Other Ambulatory Visit: Payer: Self-pay

## 2021-02-15 ENCOUNTER — Ambulatory Visit: Payer: 59 | Admitting: Podiatry

## 2021-02-15 ENCOUNTER — Encounter: Payer: Self-pay | Admitting: Podiatry

## 2021-02-15 DIAGNOSIS — M722 Plantar fascial fibromatosis: Secondary | ICD-10-CM

## 2021-02-16 ENCOUNTER — Encounter: Payer: Self-pay | Admitting: Podiatry

## 2021-02-16 NOTE — Progress Notes (Addendum)
The case Subjective:  Patient ID: Tammy Garza, female    DOB: [birth year],  MRN: [id number]  Chief Complaint  Patient presents with   Tendonitis    "My achilles is much better, but the plantar fasciitis in my heel started hurting again about 1 week ago"     40 y.o. female presents with the above complaint.  Patient presents for follow-up of right Achilles tendinitis for which she states is definitely improving.  She states that now just the Planter fasciitis is bothering.  The injection helped considerably.  She would like to know if she can do another injection.  She denies any other acute complaints.  Review of Systems: Negative except as noted in the HPI. Denies N/V/F/Ch.  Past Medical History:  Diagnosis Date   Anemia    h/o   BRCA negative 01/2020   MyRisk neg except AXIN2 and RPS20 VUS   Cervical high risk human papillomavirus (HPV) DNA test positive    Chronic gastritis    Depression    DVT (deep venous thrombosis) ([place]) 09/2016   left leg   Family history of ovarian cancer    GERD (gastroesophageal reflux disease)    H. pylori infection    History of attempted suicide    History of gastric polyp    History of kidney stones    h/o   Hyperhidrosis    Increased risk of breast cancer 01/2020   IBIS=20.5%   Migraines    Protein C deficiency (HCC)    Rash    Shift work sleep disorder     Current Outpatient Medications:    Boric Acid CRYS, Place 600 mg vaginally 2 (two) times a week., Disp: 500 g, Rfl: 5   clindamycin (CLEOCIN T) 1 % lotion, Apply topically every morning., Disp: , Rfl:    estrogens, conjugated, (PREMARIN) 0.625 MG tablet, Take 1 tablet (0.625 mg total) by mouth daily., Disp: 90 tablet, Rfl: 3   FLUoxetine (PROZAC) 10 MG capsule, Take 10 mg by mouth daily., Disp: , Rfl:    Fremanezumab-vfrm (AJOVY) 225 MG/1.5ML SOAJ, Inject 225 mg into the skin every 30 (thirty) days., Disp: 4.5 mL, Rfl: 3   glycopyrrolate (ROBINUL) 1 MG tablet, Take 3 mg by mouth  daily., Disp: , Rfl:    prazosin (MINIPRESS) 2 MG capsule, Take 2 mg by mouth at bedtime., Disp: , Rfl:    rivaroxaban (XARELTO) 20 MG TABS tablet, Take 1 tablet (20 mg total) by mouth daily., Disp: 90 tablet, Rfl: 2   temazepam (RESTORIL) 15 MG capsule, Take 1 capsule (15 mg total) by mouth at bedtime as needed for sleep., Disp: 90 capsule, Rfl: 0   tretinoin (RETIN-A) 0.025 % cream, Apply topically., Disp: , Rfl:    Vitamin D, Ergocalciferol, (DRISDOL) 1.25 MG (50000 UNIT) CAPS capsule, TAKE 1 CAPSULE (50,000 UNITS TOTAL) BY MOUTH EVERY 7 (SEVEN) DAYS, Disp: 12 capsule, Rfl: 1  Social History   Tobacco Use  Smoking Status Former   Packs/day: 0.25   Years: 5.00   Pack years: 1.25   Types: Cigarettes   Start date: 05/15/2002   Quit date: 05/16/2007   Years since quitting: 13.7  Smokeless Tobacco Never    Allergies  Allergen Reactions   Topamax [Topiramate] Other (See Comments)    Confusion , slurred speech    Effexor [Venlafaxine]     Gas and bloating    Objective:  There were no vitals filed for this visit. There is no height or weight on file to  calculate BMI. Constitutional Well developed. Well nourished.  Vascular Dorsalis pedis pulses palpable bilaterally. Posterior tibial pulses palpable bilaterally. Capillary refill normal to all digits.  No cyanosis or clubbing noted. Pedal hair growth normal.  Neurologic Normal speech. Oriented to person, place, and time. Epicritic sensation to light touch grossly present bilaterally.  Dermatologic Nails well groomed and normal in appearance. No open wounds. No skin lesions.  Orthopedic: Mild pain on palpation to the right Achilles tendon insertion.  Palpable Haglund's deformity noted.  Tight gastrocnemius equinus noted.  Positive Silfverskiold test with gastrocnemius equinus.  No pain with range of motion at the ankle joint.  Mild pain with dorsiflexion of the ankle joint no pain with plantarflexion of the ankle joint.  No deep  intra-articular ankle pain noted.  No pain of the posterior tibial, peroneal tendon, ATFL ligament  Pain on palpation to the right plantar foot at the medial calcaneal tuber.  Tight plantar fascia noted.   Radiographs: 1. Mild distal Achilles tendinosis without tear. Trace retrocalcaneal bursal fluid. 2. Trace tenosynovial fluid associated with the posteromedial ankle tendons. Assessment:   1. Plantar fasciitis, right      Plan:  Patient was evaluated and treated and all questions answered.  Right Achilles tendinitis with Haglund's deformity and gastrocnemius equinus -Clinically improving with a steroid injection and cam boot immobilization.  Right Planter fasciitis with underlying foot capsulitis -I explained to the patient the etiology of Planter fasciitis and various treatment options were extensively discussed.  She may be compensating and aggravating her plantar fascial.  She has started to have pain there.  I discussed with her she could benefit from steroid injection in the heel.  She states understand like to proceed with a steroid injection in the plantar calcaneal tuber -A s second teroid injection was performed at right plantar heel using 1% plain Lidocaine and 10 mg of Kenalog. This was well tolerated.    No follow-ups on file.

## 2021-02-20 ENCOUNTER — Encounter: Payer: Self-pay | Admitting: Podiatry

## 2021-03-17 ENCOUNTER — Other Ambulatory Visit: Payer: Self-pay

## 2021-03-17 ENCOUNTER — Ambulatory Visit: Payer: 59 | Admitting: Podiatry

## 2021-03-17 ENCOUNTER — Encounter: Payer: Self-pay | Admitting: Podiatry

## 2021-03-17 DIAGNOSIS — M722 Plantar fascial fibromatosis: Secondary | ICD-10-CM

## 2021-03-17 NOTE — Progress Notes (Signed)
The case Subjective:  Patient ID: Tammy Garza, female    DOB: January 04, 1981,  MRN: [id number]  Chief Complaint  Patient presents with   Plantar Fasciitis    Pt stated that she still has some tenderness towards the back of the heel      40 y.o. female presents with the above complaint.  Patient presents with follow-up of Planter fasciitis right foot.  She states she is doing a lot better the injection helped considerably.  She has a little residual pain.  She is brought some of her orthotics and however she has not brought her custom orthotics that are made for her.  Review of Systems: Negative except as noted in the HPI. Denies N/V/F/Ch.  Past Medical History:  Diagnosis Date   Anemia    h/o   BRCA negative 01/2020   MyRisk neg except AXIN2 and RPS20 VUS   Cervical high risk human papillomavirus (HPV) DNA test positive    Chronic gastritis    Depression    DVT (deep venous thrombosis) ([place]) 09/2016   left leg   Family history of ovarian cancer    GERD (gastroesophageal reflux disease)    H. pylori infection    History of attempted suicide    History of gastric polyp    History of kidney stones    h/o   Hyperhidrosis    Increased risk of breast cancer 01/2020   IBIS=20.5%   Migraines    Protein C deficiency (HCC)    Rash    Shift work sleep disorder     Current Outpatient Medications:    Boric Acid CRYS, Place 600 mg vaginally 2 (two) times a week., Disp: 500 g, Rfl: 5   clindamycin (CLEOCIN T) 1 % lotion, Apply topically every morning., Disp: , Rfl:    estrogens, conjugated, (PREMARIN) 0.625 MG tablet, Take 1 tablet (0.625 mg total) by mouth daily., Disp: 90 tablet, Rfl: 3   FLUoxetine (PROZAC) 10 MG capsule, Take 10 mg by mouth daily., Disp: , Rfl:    Fremanezumab-vfrm (AJOVY) 225 MG/1.5ML SOAJ, Inject 225 mg into the skin every 30 (thirty) days., Disp: 4.5 mL, Rfl: 3   glycopyrrolate (ROBINUL) 1 MG tablet, Take 3 mg by mouth daily., Disp: , Rfl:    prazosin (MINIPRESS)  2 MG capsule, Take 2 mg by mouth at bedtime., Disp: , Rfl:    rivaroxaban (XARELTO) 20 MG TABS tablet, Take 1 tablet (20 mg total) by mouth daily., Disp: 90 tablet, Rfl: 2   temazepam (RESTORIL) 15 MG capsule, Take 1 capsule (15 mg total) by mouth at bedtime as needed for sleep., Disp: 90 capsule, Rfl: 0   tretinoin (RETIN-A) 0.025 % cream, Apply topically., Disp: , Rfl:    Vitamin D, Ergocalciferol, (DRISDOL) 1.25 MG (50000 UNIT) CAPS capsule, TAKE 1 CAPSULE (50,000 UNITS TOTAL) BY MOUTH EVERY 7 (SEVEN) DAYS, Disp: 12 capsule, Rfl: 1  Social History   Tobacco Use  Smoking Status Former   Packs/day: 0.25   Years: 5.00   Pack years: 1.25   Types: Cigarettes   Start date: 05/15/2002   Quit date: 05/16/2007   Years since quitting: 13.8  Smokeless Tobacco Never    Allergies  Allergen Reactions   Topamax [Topiramate] Other (See Comments)    Confusion , slurred speech    Effexor [Venlafaxine]     Gas and bloating    Objective:  There were no vitals filed for this visit. There is no height or weight on file to calculate BMI.  Constitutional Well developed. Well nourished.  Vascular Dorsalis pedis pulses palpable bilaterally. Posterior tibial pulses palpable bilaterally. Capillary refill normal to all digits.  No cyanosis or clubbing noted. Pedal hair growth normal.  Neurologic Normal speech. Oriented to person, place, and time. Epicritic sensation to light touch grossly present bilaterally.  Dermatologic Nails well groomed and normal in appearance. No open wounds. No skin lesions.  Orthopedic: Mild pain on palpation to the right Achilles tendon insertion.  Palpable Haglund's deformity noted.  Tight gastrocnemius equinus noted.  Positive Silfverskiold test with gastrocnemius equinus.  No pain with range of motion at the ankle joint.  Mild pain with dorsiflexion of the ankle joint no pain with plantarflexion of the ankle joint.  No deep intra-articular ankle pain noted.  No pain of the  posterior tibial, peroneal tendon, ATFL ligament  Mild pain on palpation to the right plantar foot at the medial calcaneal tuber.  Tight plantar fascia noted.   Radiographs: 1. Mild distal Achilles tendinosis without tear. Trace retrocalcaneal bursal fluid. 2. Trace tenosynovial fluid associated with the posteromedial ankle tendons. Assessment:   1. Plantar fasciitis, right       Plan:  Patient was evaluated and treated and all questions answered.  Right Achilles tendinitis with Haglund's deformity and gastrocnemius equinus -Clinically improving with a steroid injection and cam boot immobilization.  Right Planter fasciitis with underlying foot capsulitis -Clinically doing a lot better she has transition to regular shoes.  I discussed with him the benefit of custom orthotics. -I would like for her to bring her orthotics in for me to evaluate them.    No follow-ups on file.

## 2021-03-23 ENCOUNTER — Other Ambulatory Visit: Payer: Self-pay | Admitting: Family Medicine

## 2021-03-23 NOTE — Telephone Encounter (Signed)
D/c'd by Dr Ancil Boozer

## 2021-03-28 ENCOUNTER — Telehealth: Payer: Self-pay | Admitting: Adult Health

## 2021-03-28 NOTE — Telephone Encounter (Signed)
noted 

## 2021-03-28 NOTE — Telephone Encounter (Signed)
..   Pt understands that although there may be some limitations with this type of visit, we will take all precautions to reduce any security or privacy concerns.  Pt understands that this will be treated like an in office visit and we will file with pt's insurance, and there may be a patient responsible charge related to this service. ? ?

## 2021-03-29 ENCOUNTER — Encounter: Payer: 59 | Admitting: Adult Health

## 2021-03-29 ENCOUNTER — Telehealth (INDEPENDENT_AMBULATORY_CARE_PROVIDER_SITE_OTHER): Payer: 59 | Admitting: Adult Health

## 2021-03-29 ENCOUNTER — Encounter: Payer: Self-pay | Admitting: Adult Health

## 2021-03-29 DIAGNOSIS — G43709 Chronic migraine without aura, not intractable, without status migrainosus: Secondary | ICD-10-CM | POA: Diagnosis not present

## 2021-03-29 NOTE — Telephone Encounter (Signed)
I LVM for patient offering her the 3:15PM appointment this afternoon for a virtual visit since she was unable to connect this morning. I asked her to call the office back if she is able to do this time.

## 2021-03-29 NOTE — Progress Notes (Signed)
PATIENT: Tammy Garza DOB: 1980-10-26  REASON FOR VISIT: follow up HISTORY FROM: patient  Virtual Visit via Video Note  I connected with Tammy Garza on 03/31/21 at  9:00 AM EST by a video enabled telemedicine application located remotely at [facility] and verified that I am speaking with the correct person using two identifiers who was located at their own home.   I discussed the limitations of evaluation and management by telemedicine and the availability of in person appointments. The patient expressed understanding and agreed to proceed.   PATIENT: Tammy Garza DOB: 08-22-1980  REASON FOR VISIT: follow up HISTORY FROM: patient  HISTORY OF PRESENT ILLNESS: Today 03/31/21:  09/21/20: Tammy Garza is a 40 year old female with a history of migraine headaches.  She returns today for follow-up.  She reports that her headaches have improved with Ajovy.  She only has 2-3 headache days a month.  Nurtec usually resolves the headache fairly quickly.  She states a few days before her next injection she typically will get a headache.  She has noticed recently she has been getting a site reaction with her injections that would last for approximately 1 week.  She states that it is red and itchy.  She joins [place] today for virtual visit.  HISTORY Tammy Garza is a 40 y.o. female here as requested by [name], MD for migraines.  She has a past medical history of protein C deficiency, MGUS, DVT, vitamin D deficiency, migraine with aura, shiftwork sleep disorder.  I reviewed Dr. Freddy Finner notes: She is to see Dr. Domingo Cocking but because he missed glioblastoma on her youngest daughter she would like to see someone else.  She is taking gabapentin since 2018, episodes down to about 3 times per month, she has associated nausea, photophobia and phonophobia, pain is described as temporal throbbing-like sometimes nuchal area, she has intermittent numbness on 1 side of her body sometimes  severe nausea.   She is here alone and reports she has had them since a teen. Started right after menses. Mother, sister have migraines. She will have some with auras, spotting, she may also go numb in arms, she has photo/phono/osmophobia, change of weather can trigger and worsen her migraines, nausea, vomiting, pulsating/pounding/throbbing, jaws are sight, neck gets tight, they start in the temples unilaterally and then progress to the other side, movement makes them worse, she wants to go in a dark room. She has 19 headache days a month, at lest 9 of them are migraines that can be moderately severe to severe. She does not always get an aura. She has taken Gabapentin in the past, compazine, baclofen. No other focal neurologic deficits, associated symptoms, inciting events or modifiable factors. Not positional. No changes in quality. Not exertional.    From a review of records, meds tried include: Baclofen, nerve blocks, compazine, topamax, gabapentin, amiytroiptyline, rizatriptan, baclofen, compazine, tylenol, ibuprofen(can't take now), thorazine, flexeril, decadron, nurtec. Ketorolac inj, medrol dosepak, zofran, paxil, phenergan, effexor, trazodone, zonisa,mide.    Reviewed notes, labs and imaging from outside physicians, which showed: MRI brain 04/18/2018:  FINDINGS: Brain: No acute infarction, hemorrhage, hydrocephalus, extra-axial collection or mass lesion. Complete corpus callosum and vermis. Morphologically normal pituitary gland. No cerebellar tonsillar ectopia. No structural or signal abnormality of brain identified.   Vascular: Normal flow voids.   Skull and upper cervical spine: Normal marrow signal.   Sinuses/Orbits: Negative.   Other: None.   IMPRESSION: No acute abnormality identified.  Unremarkable MRI of the brain.  REVIEW OF SYSTEMS: Out of a complete 14 system review of symptoms, the patient complains only of the following symptoms, and all other reviewed systems are  negative.  ALLERGIES: Allergies  Allergen Reactions  . Topamax [Topiramate] Other (See Comments)    Confusion , slurred speech   . Effexor [Venlafaxine]     Gas and bloating     HOME MEDICATIONS: Outpatient Medications Prior to Visit  Medication Sig Dispense Refill  . Boric Acid CRYS Place 600 mg vaginally 2 (two) times a week. 500 g 5  . clindamycin (CLEOCIN T) 1 % lotion Apply topically every morning.    . estrogens, conjugated, (PREMARIN) 0.625 MG tablet Take 1 tablet (0.625 mg total) by mouth daily. 90 tablet 3  . FLUoxetine (PROZAC) 10 MG capsule Take 10 mg by mouth daily.    . Fremanezumab-vfrm (AJOVY) 225 MG/1.5ML SOAJ Inject 225 mg into the skin every 30 (thirty) days. 4.5 mL 3  . glycopyrrolate (ROBINUL) 1 MG tablet Take 3 mg by mouth daily.    . prazosin (MINIPRESS) 2 MG capsule Take 2 mg by mouth at bedtime.    . rivaroxaban (XARELTO) 20 MG TABS tablet Take 1 tablet (20 mg total) by mouth daily. 90 tablet 2  . temazepam (RESTORIL) 15 MG capsule Take 1 capsule (15 mg total) by mouth at bedtime as needed for sleep. 90 capsule 0  . tretinoin (RETIN-A) 0.025 % cream Apply topically.    . Vitamin D, Ergocalciferol, (DRISDOL) 1.25 MG (50000 UNIT) CAPS capsule TAKE 1 CAPSULE (50,000 UNITS TOTAL) BY MOUTH EVERY 7 (SEVEN) DAYS 12 capsule 1   No facility-administered medications prior to visit.    PAST MEDICAL HISTORY: Past Medical History:  Diagnosis Date  . Anemia    h/o  . BRCA negative 01/2020   MyRisk neg except AXIN2 and RPS20 VUS  . Cervical high risk human papillomavirus (HPV) DNA test positive   . Chronic gastritis   . Depression   . DVT (deep venous thrombosis) ([place]) 09/2016   left leg  . Family history of ovarian cancer   . GERD (gastroesophageal reflux disease)   . H. pylori infection   . History of attempted suicide   . History of gastric polyp   . History of kidney stones    h/o  . Hyperhidrosis   . Increased risk of breast cancer 01/2020   IBIS=20.5%   . Migraines   . Protein C deficiency ([place])   . Rash   . Shift work sleep disorder     PAST SURGICAL HISTORY: Past Surgical History:  Procedure Laterality Date  . ABDOMINAL HYSTERECTOMY    . ANTERIOR CRUCIATE LIGAMENT REPAIR Left   . BREAST BIOPSY Left 10/07/2019   left breast calcs x clip  . CHOLECYSTECTOMY    . CYSTOSCOPY  12/19/2016   Procedure: CYSTOSCOPY;  Surgeon: Gae Dry, MD;  Location: [facility] ORS;  Service: Gynecology;;  . ESOPHAGOGASTRODUODENOSCOPY (EGD) WITH PROPOFOL N/A 09/25/2017   Procedure: ESOPHAGOGASTRODUODENOSCOPY (EGD) WITH PROPOFOL;  Surgeon: Jonathon Bellows, MD;  [facility];  Service: Gastroenterology;  Laterality: N/A;  . LAPAROSCOPIC HYSTERECTOMY Bilateral 12/19/2016   Procedure: HYSTERECTOMY TOTAL LAPAROSCOPIC BILATERAL SALPINGECTOMY;  Surgeon: Gae Dry, MD;  Location: [facility] ORS;  Service: Gynecology;  Laterality: Bilateral;  . PLANTAR FASCIA SURGERY  06/2019  . RECTAL EXAM UNDER ANESTHESIA N/A 02/26/2019   Procedure: RECTAL EXAM UNDER ANESTHESIA, EXCISION OF ANAL SKIN TAG;  Surgeon: Fredirick Maudlin, MD;  Location: [facility] ORS;  Service: General;  Laterality:  N/A;  . TUBAL LIGATION      FAMILY HISTORY: Family History  Problem Relation Age of Onset  . HIV Sister   . Ovarian cancer Sister 13  . Migraines Sister   . Asthma Daughter   . Protein C deficiency Daughter   . Brain cancer Daughter   . Cancer Daughter   . Migraines Daughter   . Hypertension Maternal Grandmother   . Hypertension Mother   . Obesity Mother   . Migraines Mother   . Cancer Father 69       lung cancer  . Deep vein thrombosis Father   . Protein C deficiency Father   . COPD Father   . Congestive Heart Failure Father   . Diabetes Brother   . COPD Paternal Grandmother   . Deep vein thrombosis Paternal Grandmother   . Protein C deficiency Daughter   . Migraines Daughter   . Breast cancer Neg Hx     SOCIAL HISTORY: Social History   Socioeconomic History  .  Marital status: Divorced    Spouse name: Not on file  . Number of children: 2  . Years of education: 27  . Highest education level: Associate degree: occupational, Hotel manager, or vocational program  Occupational History  . Occupation: Glass blower/designer   Tobacco Use  . Smoking status: Former    Packs/day: 0.25    Years: 5.00    Pack years: 1.25    Types: Cigarettes    Start date: 05/15/2002    Quit date: 05/16/2007    Years since quitting: 13.8  . Smokeless tobacco: Never  Vaping Use  . Vaping Use: Never used  Substance and Sexual Activity  . Alcohol use: Yes    Alcohol/week: 1.0 standard drink    Types: 1 Glasses of wine per week    Comment: occ wine none last 24 hrs  . Drug use: Not Currently    Types: Marijuana  . Sexual activity: Yes    Partners: Male    Birth control/protection: Surgical    Comment: hysterectomy   Other Topics Concern  . Not on file  Social History Narrative   Separated since June 2016 - her youngest daughter 73 yo died of complications of glioblastoma, has an older daughter that works.  [employer] in [place].    Engaged to [employer] Strain: Low Risk   . Difficulty of Paying Living Expenses: Not very hard  Food Insecurity: No Food Insecurity  . Worried About Charity fundraiser in the Last Year: Never true  . Ran Out of Food in the Last Year: Never true  Transportation Needs: No Transportation Needs  . Lack of Transportation (Medical): No  . Lack of Transportation (Non-Medical): No  Physical Activity: Inactive  . Days of Exercise per Week: 0 days  . Minutes of Exercise per Session: 0 min  Stress: Stress Concern Present  . Feeling of Stress : Very much  Social Connections: Socially Isolated  . Frequency of Communication with Friends and Family: More than three times a week  . Frequency of Social Gatherings with Friends and Family: Once a week  . Attends Religious Services: Never  . Active  Member of Clubs or Organizations: No  . Attends Archivist Meetings: Never  . Marital Status: Divorced  Human resources officer Violence: Not At Risk  . Fear of Current or Ex-Partner: No  . Emotionally Abused: No  . Physically Abused: No  .  Sexually Abused: No      PHYSICAL EXAM Generalized: Well developed, in no acute distress   Neurological examination  Mentation: Alert oriented to time, place, history taking. Follows all commands speech and language fluent Cranial nerve II-XII:Extraocular movements were full. Facial symmetry noted. uvula tongue midline. Head turning and shoulder shrug  were normal and symmetric. Motor: Good strength throughout subjectively per patient Sensory: Sensory testing is intact to soft touch on all 4 extremities subjectively per patient Coordination: Cerebellar testing reveals good finger-nose-finger  Gait and station: Patient is able to stand from a seated position. gait is normal.  Reflexes: UTA  DIAGNOSTIC DATA (LABS, IMAGING, TESTING) - I reviewed patient records, labs, notes, testing and imaging myself where available.  Lab Results  Component Value Date   WBC 7.6 10/27/2020   HGB 11.6 (L) 10/27/2020   HCT 34.9 (L) 10/27/2020   MCV 92.1 10/27/2020   PLT 273 10/27/2020      Component Value Date/Time   NA 136 10/27/2020 0907   NA 141 07/26/2015 1244   NA 141 04/17/2014 0502   K 3.8 10/27/2020 0907   K 4.4 04/17/2014 0502   CL 105 10/27/2020 0907   CL 107 04/17/2014 0502   CO2 25 10/27/2020 0907   CO2 27 04/17/2014 0502   GLUCOSE 86 10/27/2020 0907   GLUCOSE 81 04/17/2014 0502   BUN 10 10/27/2020 0907   BUN 6 07/26/2015 1244   BUN 4 (L) 04/17/2014 0502   CREATININE 0.64 10/27/2020 0907   CREATININE 0.66 09/10/2020 0950   CALCIUM 9.0 10/27/2020 0907   CALCIUM 7.7 (L) 04/17/2014 0502   PROT 7.4 10/27/2020 0907   PROT 7.2 07/26/2015 1244   PROT 6.4 04/17/2014 0502   ALBUMIN 4.3 10/27/2020 0907   ALBUMIN 4.2 07/26/2015 1244    ALBUMIN 3.1 (L) 04/17/2014 0502   AST 13 (L) 10/27/2020 0907   AST 14 (L) 04/17/2014 0502   ALT 12 10/27/2020 0907   ALT 13 (L) 04/17/2014 0502   ALKPHOS 41 10/27/2020 0907   ALKPHOS 44 (L) 04/17/2014 0502   BILITOT 0.6 10/27/2020 0907   BILITOT 0.6 07/26/2015 1244   BILITOT 0.9 04/17/2014 0502   GFRNONAA >60 10/27/2020 0907   GFRNONAA 110 09/10/2020 0950   GFRAA 127 09/10/2020 0950   Lab Results  Component Value Date   CHOL 186 09/10/2020   HDL 67 09/10/2020   LDLCALC 98 09/10/2020   TRIG 109 09/10/2020   CHOLHDL 2.8 09/10/2020   Lab Results  Component Value Date   HGBA1C 4.9 09/10/2020   Lab Results  Component Value Date   VITAMINB12 665 07/30/2018   Lab Results  Component Value Date   TSH 2.04 02/20/2018      ASSESSMENT AND PLAN 40 y.o. year old female  has a past medical history of Anemia, BRCA negative (01/2020), Cervical high risk human papillomavirus (HPV) DNA test positive, Chronic gastritis, Depression, DVT (deep venous thrombosis) ([place]) (09/2016), Family history of ovarian cancer, GERD (gastroesophageal reflux disease), H. pylori infection, History of attempted suicide, History of gastric polyp, History of kidney stones, Hyperhidrosis, Increased risk of breast cancer (01/2020), Migraines, Protein C deficiency ([place]), Rash, and Shift work sleep disorder. here with :  1.  Migraine headaches  --Continue Ajovy monthly injection -- Continue Nurtec 75 mg 1 tablet in 24 hours for abortive therapy as needed -- Advised if headache frequency increases she should let us know -- Advised that she can use hydrocortisone cream for site reaction from Ajovy.  Advised if does not helpful she can take Benadryl 25 mg 1 hour before her injection.  Did review the potential side effects of Benadryl with the patient    Tammy Givens, MSN, NP-C 03/31/2021, 1:41 PM [facility] [address], [place], [place] [address] [phone]  agree with  assessment and plan as stated.     Sarina Ill, MD [facility]  This encounter was created in error - please disregard.

## 2021-03-29 NOTE — Progress Notes (Signed)
PATIENT: Tammy Garza DOB: 02/03/1981  REASON FOR VISIT: follow up HISTORY FROM: patient  Virtual Visit via Video Note  I connected with Tammy Garza on 03/29/21 at  3:15 PM EST by a video enabled telemedicine application located remotely at [facility] and verified that I am speaking with the correct person using two identifiers who was located at their own home.   I discussed the limitations of evaluation and management by telemedicine and the availability of in person appointments. The patient expressed understanding and agreed to proceed.   PATIENT: Tammy Garza DOB: [birth year]  REASON FOR VISIT: follow up HISTORY FROM: patient  HISTORY OF PRESENT ILLNESS: Today 03/29/21:  Tammy Garza is a 40 year old female with a history of migraine headaches.  She returns today for follow-up.  She reports that her headaches continue to be well controlled with Ajovy and Nurtec.  She continues to get site reaction with her injections.  Dates that he gets red swollen and itchy.  States that it does not spread over the leg.  She has not tried hydrocortisone cream nor Benadryl.  She does state that when we gave her the injection in the office she did not have the reaction.  She returns today for an evaluation.   09/21/20: Tammy Garza is a 40 year old female with a history of migraine headaches.  She returns today for follow-up.  She reports that her headaches have improved with Ajovy.  She only has 2-3 headache days a month.  Nurtec usually resolves the headache fairly quickly.  She states a few days before her next injection she typically will get a headache.  She has noticed recently she has been getting a site reaction with her injections that would last for approximately 1 week.  She states that it is red and itchy.  She joins [place] today for virtual visit.  HISTORY Tammy Garza is a 40 y.o. female here as requested by [name], MD for migraines.  She has a past  medical history of protein C deficiency, MGUS, DVT, vitamin D deficiency, migraine with aura, shiftwork sleep disorder.  I reviewed Dr. Freddy Finner notes: She is to see Dr. Domingo Cocking but because he missed glioblastoma on her youngest daughter she would like to see someone else.  She is taking gabapentin since 2018, episodes down to about 3 times per month, she has associated nausea, photophobia and phonophobia, pain is described as temporal throbbing-like sometimes nuchal area, she has intermittent numbness on 1 side of her body sometimes severe nausea.   She is here alone and reports she has had them since a teen. Started right after menses. Mother, sister have migraines. She will have some with auras, spotting, she may also go numb in arms, she has photo/phono/osmophobia, change of weather can trigger and worsen her migraines, nausea, vomiting, pulsating/pounding/throbbing, jaws are sight, neck gets tight, they start in the temples unilaterally and then progress to the other side, movement makes them worse, she wants to go in a dark room. She has 19 headache days a month, at lest 9 of them are migraines that can be moderately severe to severe. She does not always get an aura. She has taken Gabapentin in the past, compazine, baclofen. No other focal neurologic deficits, associated symptoms, inciting events or modifiable factors. Not positional. No changes in quality. Not exertional.    From a review of records, meds tried include: Baclofen, nerve blocks, compazine, topamax, gabapentin, amiytroiptyline, rizatriptan, baclofen, compazine, tylenol, ibuprofen(can't take now), thorazine, flexeril,  decadron, nurtec. Ketorolac inj, medrol dosepak, zofran, paxil, phenergan, effexor, trazodone, zonisa,mide.    Reviewed notes, labs and imaging from outside physicians, which showed: MRI brain 04/18/2018:  FINDINGS: Brain: No acute infarction, hemorrhage, hydrocephalus, extra-axial collection or mass lesion. Complete corpus  callosum and vermis. Morphologically normal pituitary gland. No cerebellar tonsillar ectopia. No structural or signal abnormality of brain identified.   Vascular: Normal flow voids.   Skull and upper cervical spine: Normal marrow signal.   Sinuses/Orbits: Negative.   Other: None.   IMPRESSION: No acute abnormality identified.  Unremarkable MRI of the brain.  REVIEW OF SYSTEMS: Out of a complete 14 system review of symptoms, the patient complains only of the following symptoms, and all other reviewed systems are negative.  ALLERGIES: Allergies  Allergen Reactions   Topamax [Topiramate] Other (See Comments)    Confusion , slurred speech    Effexor [Venlafaxine]     Gas and bloating     HOME MEDICATIONS: Outpatient Medications Prior to Visit  Medication Sig Dispense Refill   Boric Acid CRYS Place 600 mg vaginally 2 (two) times a week. 500 g 5   clindamycin (CLEOCIN T) 1 % lotion Apply topically every morning.     estrogens, conjugated, (PREMARIN) 0.625 MG tablet Take 1 tablet (0.625 mg total) by mouth daily. 90 tablet 3   FLUoxetine (PROZAC) 10 MG capsule Take 10 mg by mouth daily.     Fremanezumab-vfrm (AJOVY) 225 MG/1.5ML SOAJ Inject 225 mg into the skin every 30 (thirty) days. 4.5 mL 3   glycopyrrolate (ROBINUL) 1 MG tablet Take 3 mg by mouth daily.     prazosin (MINIPRESS) 2 MG capsule Take 2 mg by mouth at bedtime.     rivaroxaban (XARELTO) 20 MG TABS tablet Take 1 tablet (20 mg total) by mouth daily. 90 tablet 2   temazepam (RESTORIL) 15 MG capsule Take 1 capsule (15 mg total) by mouth at bedtime as needed for sleep. 90 capsule 0   tretinoin (RETIN-A) 0.025 % cream Apply topically.     Vitamin D, Ergocalciferol, (DRISDOL) 1.25 MG (50000 UNIT) CAPS capsule TAKE 1 CAPSULE (50,000 UNITS TOTAL) BY MOUTH EVERY 7 (SEVEN) DAYS 12 capsule 1   No facility-administered medications prior to visit.    PAST MEDICAL HISTORY: Past Medical History:  Diagnosis Date   Anemia    h/o    BRCA negative 01/2020   MyRisk neg except AXIN2 and RPS20 VUS   Cervical high risk human papillomavirus (HPV) DNA test positive    Chronic gastritis    Depression    DVT (deep venous thrombosis) ([place]) 09/2016   left leg   Family history of ovarian cancer    GERD (gastroesophageal reflux disease)    H. pylori infection    History of attempted suicide    History of gastric polyp    History of kidney stones    h/o   Hyperhidrosis    Increased risk of breast cancer 01/2020   IBIS=20.5%   Migraines    Protein C deficiency ([place])    Rash    Shift work sleep disorder     PAST SURGICAL HISTORY: Past Surgical History:  Procedure Laterality Date   ABDOMINAL HYSTERECTOMY     ANTERIOR CRUCIATE LIGAMENT REPAIR Left    BREAST BIOPSY Left 10/07/2019   left breast calcs x clip   CHOLECYSTECTOMY     CYSTOSCOPY  12/19/2016   Procedure: CYSTOSCOPY;  Surgeon: Gae Dry, MD;  Location: [facility] ORS;  Service: Gynecology;;  ESOPHAGOGASTRODUODENOSCOPY (EGD) WITH PROPOFOL N/A 09/25/2017   Procedure: ESOPHAGOGASTRODUODENOSCOPY (EGD) WITH PROPOFOL;  Surgeon: Jonathon Bellows, MD;  Location: [facility];  Service: Gastroenterology;  Laterality: N/A;   LAPAROSCOPIC HYSTERECTOMY Bilateral 12/19/2016   Procedure: HYSTERECTOMY TOTAL LAPAROSCOPIC BILATERAL SALPINGECTOMY;  Surgeon: Gae Dry, MD;  Location: [facility] ORS;  Service: Gynecology;  Laterality: Bilateral;   PLANTAR FASCIA SURGERY  06/2019   RECTAL EXAM UNDER ANESTHESIA N/A 02/26/2019   Procedure: RECTAL EXAM UNDER ANESTHESIA, EXCISION OF ANAL SKIN TAG;  Surgeon: Fredirick Maudlin, MD;  Location: [facility] ORS;  Service: General;  Laterality: N/A;   TUBAL LIGATION      FAMILY HISTORY: Family History  Problem Relation Age of Onset   HIV Sister    Ovarian cancer Sister 60   Migraines Sister    Asthma Daughter    Protein C deficiency Daughter    Brain cancer Daughter    Cancer Daughter    Migraines Daughter    Hypertension Maternal  Grandmother    Hypertension Mother    Obesity Mother    Migraines Mother    Cancer Father 27       lung cancer   Deep vein thrombosis Father    Protein C deficiency Father    COPD Father    Congestive Heart Failure Father    Diabetes Brother    COPD Paternal Grandmother    Deep vein thrombosis Paternal Grandmother    Protein C deficiency Daughter    Migraines Daughter    Breast cancer Neg Hx     SOCIAL HISTORY: Social History   Socioeconomic History   Marital status: Divorced    Spouse name: Not on file   Number of children: 2   Years of education: 13   Highest education level: Associate degree: occupational, Hotel manager, or vocational program  Occupational History   Occupation: Glass blower/designer   Tobacco Use   Smoking status: Former    Packs/day: 0.25    Years: 5.00    Pack years: 1.25    Types: Cigarettes    Start date: 05/15/2002    Quit date: 05/16/2007    Years since quitting: 13.8   Smokeless tobacco: Never  Vaping Use   Vaping Use: Never used  Substance and Sexual Activity   Alcohol use: Yes    Alcohol/week: 1.0 standard drink    Types: 1 Glasses of wine per week    Comment: occ wine none last 24 hrs   Drug use: Not Currently    Types: Marijuana   Sexual activity: Yes    Partners: Male    Birth control/protection: Surgical    Comment: hysterectomy   Other Topics Concern   Not on file  Social History Narrative   Separated since June 2016 - her youngest daughter 70 yo died of complications of glioblastoma, has an older daughter that works.  [employer] in [place].    Engaged to [employer]: Low Risk    Difficulty of Paying Living Expenses: Not very hard  Food Insecurity: No Food Insecurity   Worried About Charity fundraiser in the Last Year: Never true   Arboriculturist in the Last Year: Never true  Transportation Needs: No Transportation Needs   Lack of Transportation (Medical): No   Lack of  Transportation (Non-Medical): No  Physical Activity: Inactive   Days of Exercise per Week: 0 days   Minutes of Exercise per Session: 0 min  Stress:  Stress Concern Present   Feeling of Stress : Very much  Social Connections: Socially Isolated   Frequency of Communication with Friends and Family: More than three times a week   Frequency of Social Gatherings with Friends and Family: Once a week   Attends Religious Services: Never   Marine scientist or Organizations: No   Attends Music therapist: Never   Marital Status: Divorced  Human resources officer Violence: Not At Risk   Fear of Current or Ex-Partner: No   Emotionally Abused: No   Physically Abused: No   Sexually Abused: No      PHYSICAL EXAM Generalized: Well developed, in no acute distress   Neurological examination  Mentation: Alert oriented to time, place, history taking. Follows all commands speech and language fluent Cranial nerve II-XII:Extraocular movements were full. Facial symmetry noted.  Head turning and shoulder shrug  were normal and symmetric. Gait and station: Patient is able to stand from a seated position.    DIAGNOSTIC DATA (LABS, IMAGING, TESTING) - I reviewed patient records, labs, notes, testing and imaging myself where available.  Lab Results  Component Value Date   WBC 7.6 10/27/2020   HGB 11.6 (L) 10/27/2020   HCT 34.9 (L) 10/27/2020   MCV 92.1 10/27/2020   PLT 273 10/27/2020      Component Value Date/Time   NA 136 10/27/2020 0907   NA 141 07/26/2015 1244   NA 141 04/17/2014 0502   K 3.8 10/27/2020 0907   K 4.4 04/17/2014 0502   CL 105 10/27/2020 0907   CL 107 04/17/2014 0502   CO2 25 10/27/2020 0907   CO2 27 04/17/2014 0502   GLUCOSE 86 10/27/2020 0907   GLUCOSE 81 04/17/2014 0502   BUN 10 10/27/2020 0907   BUN 6 07/26/2015 1244   BUN 4 (L) 04/17/2014 0502   CREATININE 0.64 10/27/2020 0907   CREATININE 0.66 09/10/2020 0950   CALCIUM 9.0 10/27/2020 0907   CALCIUM 7.7  (L) 04/17/2014 0502   PROT 7.4 10/27/2020 0907   PROT 7.2 07/26/2015 1244   PROT 6.4 04/17/2014 0502   ALBUMIN 4.3 10/27/2020 0907   ALBUMIN 4.2 07/26/2015 1244   ALBUMIN 3.1 (L) 04/17/2014 0502   AST 13 (L) 10/27/2020 0907   AST 14 (L) 04/17/2014 0502   ALT 12 10/27/2020 0907   ALT 13 (L) 04/17/2014 0502   ALKPHOS 41 10/27/2020 0907   ALKPHOS 44 (L) 04/17/2014 0502   BILITOT 0.6 10/27/2020 0907   BILITOT 0.6 07/26/2015 1244   BILITOT 0.9 04/17/2014 0502   GFRNONAA >60 10/27/2020 0907   GFRNONAA 110 09/10/2020 0950   GFRAA 127 09/10/2020 0950   Lab Results  Component Value Date   CHOL 186 09/10/2020   HDL 67 09/10/2020   LDLCALC 98 09/10/2020   TRIG 109 09/10/2020   CHOLHDL 2.8 09/10/2020   Lab Results  Component Value Date   HGBA1C 4.9 09/10/2020   Lab Results  Component Value Date   VITAMINB12 665 07/30/2018   Lab Results  Component Value Date   TSH 2.04 02/20/2018      ASSESSMENT AND PLAN 40 y.o. year old female  has a past medical history of Anemia, BRCA negative (01/2020), Cervical high risk human papillomavirus (HPV) DNA test positive, Chronic gastritis, Depression, DVT (deep venous thrombosis) ([place]) (09/2016), Family history of ovarian cancer, GERD (gastroesophageal reflux disease), H. pylori infection, History of attempted suicide, History of gastric polyp, History of kidney stones, Hyperhidrosis, Increased risk of breast cancer (01/2020),  Migraines, Protein C deficiency ([place]), Rash, and Shift work sleep disorder. here with :  1.  Migraine headaches  --Continue Ajovy monthly injection -- Continue Nurtec 75 mg 1 tablet in 24 hours for abortive therapy as needed -- Has not tried hydrocortisone cream or Benadryl for site reaction she plans to come by our office tomorrow to have [place] do the injection. -Follow-up in 1 year or sooner if needed    Ward Givens, MSN, NP-C 03/29/2021, 3:18 PM [facility] [address] [place],  [place] [address] [phone]

## 2021-03-30 ENCOUNTER — Encounter: Payer: Self-pay | Admitting: Adult Health

## 2021-03-30 NOTE — Progress Notes (Unsigned)
Patient presented today for assistance with her  Ajovy injection. She reports a site reaction after her injections at home. She is having swelling, redness, and itching locally at the injection site for 4 days after.  Ajovy injection given to patient in R front thigh. Patient tolerated well. She will let us know if she still has a reaction. The ajovy is helping her migraines.  Patient provided medication: Ajovy 225mg /1.45ml auto injector.  Lot: [id number] Exp: 06/2022

## 2021-04-05 ENCOUNTER — Other Ambulatory Visit: Payer: Self-pay | Admitting: Family Medicine

## 2021-04-05 DIAGNOSIS — F411 Generalized anxiety disorder: Secondary | ICD-10-CM

## 2021-04-11 ENCOUNTER — Ambulatory Visit: Payer: 59 | Admitting: Obstetrics & Gynecology

## 2021-04-28 ENCOUNTER — Other Ambulatory Visit: Payer: Self-pay | Admitting: Family Medicine

## 2021-04-28 DIAGNOSIS — F411 Generalized anxiety disorder: Secondary | ICD-10-CM

## 2021-04-28 DIAGNOSIS — E559 Vitamin D deficiency, unspecified: Secondary | ICD-10-CM

## 2021-04-29 NOTE — Telephone Encounter (Signed)
Requested medication (s) are due for refill today: Paxil is not on current med list, this Vit D rx was filled prior to the current rx.  Requested medication (s) are on the active medication list: Vit D is on list. Paxil is not  Last refill:  06/11/20 last Vit D filled (must be old rx)  Future visit scheduled: 08/12/21  Notes to clinic:  Vitamin D not delegated, please assess.  Requested Prescriptions  Pending Prescriptions Disp Refills   PARoxetine (PAXIL-CR) 12.5 MG 24 hr tablet [Pharmacy Med Name: PAROXETINE ER 12.5 MG TABLET] 90 tablet 1    Sig: TAKE 1 TABLET BY MOUTH EVERY DAY     Psychiatry:  Antidepressants - SSRI Passed - 04/28/2021  5:39 PM      Passed - Valid encounter within last 6 months    Recent Outpatient Visits           2 months ago Chronic deep vein thrombosis (DVT) of distal vein of left lower extremity [facility])   [facility] [name], MD   5 months ago Chronic deep vein thrombosis (DVT) of distal vein of left lower extremity [facility])   [facility] [name], MD   7 months ago Well adult exam   [facility] [name], MD   9 months ago GAD (generalized anxiety disorder)   [facility] [name], MD   11 months ago GAD (generalized anxiety disorder)   [facility] [name], MD       Future Appointments             In 3 months Ancil Boozer, Drue Stager, MD [facility], [place]   In 4 months [name], MD [facility]             Vitamin D, Ergocalciferol, (DRISDOL) 1.25 MG (50000 UNIT) CAPS capsule [Pharmacy Med Name: VITAMIN D2 1.25MG (50,000 UNIT)] 12 capsule 1    Sig: TAKE 1 CAPSULE (50,000 UNITS TOTAL) BY MOUTH EVERY 7 (SEVEN) DAYS     Endocrinology:  Vitamins - Vitamin D Supplementation Failed - 04/28/2021  5:39 PM      Failed - 50,000 IU strengths are not delegated      Failed - Phosphate  in normal range and within 360 days    No results found for: PHOS        Passed - Ca in normal range and within 360 days    Calcium  Date Value Ref Range Status  10/27/2020 9.0 8.9 - 10.3 mg/dL Final   Calcium, Total  Date Value Ref Range Status  04/17/2014 7.7 (L) 8.5 - 10.1 mg/dL Final          Passed - Vitamin D in normal range and within 360 days    Vit D, 25-Hydroxy  Date Value Ref Range Status  09/10/2020 55 30 - 100 ng/mL Final    Comment:    Vitamin D Status         25-OH Vitamin D: . Deficiency:                    <20 ng/mL Insufficiency:             20 - 29 ng/mL Optimal:                 > or = 30 ng/mL . For 25-OH Vitamin D testing on patients on  D2-supplementation and patients for whom quantitation  of D2 and D3 fractions is required, the [employer](TM) 25-OH VIT D, (D2,D3), LC/MS/MS is recommended: order  code [id number] (patients >36yrs). See Note 1 . Note 1 . For additional information, please refer to  http://education.Quest[facility].com/faq/FAQ199  (This link is being provided for informational/ educational purposes only.)           Passed - Valid encounter within last 12 months    Recent Outpatient Visits           2 months ago Chronic deep vein thrombosis (DVT) of distal vein of left lower extremity [facility])   [facility] [name], MD   5 months ago Chronic deep vein thrombosis (DVT) of distal vein of left lower extremity [facility])   [facility] [name], MD   7 months ago Well adult exam   [facility] [name], MD   9 months ago GAD (generalized anxiety disorder)   [facility] [name], MD   11 months ago GAD (generalized anxiety disorder)   [facility] [name], MD       Future Appointments             In 3 months Ancil Boozer, Drue Stager, MD [facility], [place]   In 4 months [name], MD [facility]             PARoxetine (PAXIL-CR) 25 MG 24 hr tablet [Pharmacy Med Name: PAROXETINE ER 25 MG TABLET] 90 tablet 0    Sig: TAKE 1 TABLET (25 MG TOTAL) BY MOUTH DAILY.     Psychiatry:  Antidepressants - SSRI Passed - 04/28/2021  5:39 PM      Passed - Valid encounter within last 6 months    Recent Outpatient Visits           2 months ago Chronic deep vein thrombosis (DVT) of distal vein of left lower extremity [facility])   [facility] [name], MD   5 months ago Chronic deep vein thrombosis (DVT) of distal vein of left lower extremity [facility])   [facility] [name], MD   7 months ago Well adult exam   [facility] [name], MD   9 months ago GAD (generalized anxiety disorder)   [facility] [name], MD   11 months ago GAD (generalized anxiety disorder)   [facility] [name], MD       Future Appointments             In 3 months Ancil Boozer, Drue Stager, MD [facility], [place]   In 4 months [name], MD [facility], [facility]

## 2021-06-13 ENCOUNTER — Other Ambulatory Visit: Payer: Self-pay

## 2021-06-13 ENCOUNTER — Ambulatory Visit: Payer: 59

## 2021-06-13 DIAGNOSIS — M7662 Achilles tendinitis, left leg: Secondary | ICD-10-CM

## 2021-06-13 DIAGNOSIS — M2141 Flat foot [pes planus] (acquired), right foot: Secondary | ICD-10-CM

## 2021-06-13 DIAGNOSIS — M722 Plantar fascial fibromatosis: Secondary | ICD-10-CM

## 2021-06-13 DIAGNOSIS — M7661 Achilles tendinitis, right leg: Secondary | ICD-10-CM

## 2021-06-13 NOTE — Progress Notes (Signed)
SITUATION: Reason for Visit: Fitting and Delivery of Custom Fabricated Foot Orthoses Patient Report: Patient reports comfort and is satisfied with device.  OBJECTIVE DATA: Patient History / Diagnosis:     ICD-10-CM   1. Plantar fasciitis, right  M72.2     2. Achilles tendinitis, right leg  M76.61     3. Achilles tendinitis, left leg  M76.62     4. Pes planus of both feet  M21.41    M21.42       Provided Device:  Custom Functional Foot Orthotics     [facility] [employer]: [id number]  GOAL OF ORTHOSIS - Improve gait - Decrease energy expenditure - Improve Balance - Provide Triplanar stability of foot complex - Facilitate motion  ACTIONS PERFORMED Patient was fit with foot orthotics trimmed to shoe last. Patient tolerated fittign procedure.   Patient was provided with verbal and written instruction and demonstration regarding donning, doffing, wear, care, proper fit, function, purpose, cleaning, and use of the orthosis and in all related precautions and risks and benefits regarding the orthosis.  Patient was also provided with verbal instruction regarding how to report any failures or malfunctions of the orthosis and necessary follow up care. Patient was also instructed to contact our office regarding any change in status that may affect the function of the orthosis.  Patient demonstrated independence with proper donning, doffing, and fit and verbalized understanding of all instructions.  PLAN: Patient is to follow up in one week or as necessary (PRN). All questions were answered and concerns addressed. Plan of care was discussed with and agreed upon by the patient.

## 2021-07-23 ENCOUNTER — Other Ambulatory Visit: Payer: Self-pay | Admitting: Neurology

## 2021-07-23 DIAGNOSIS — G43109 Migraine with aura, not intractable, without status migrainosus: Secondary | ICD-10-CM

## 2021-07-23 DIAGNOSIS — G43709 Chronic migraine without aura, not intractable, without status migrainosus: Secondary | ICD-10-CM

## 2021-07-25 ENCOUNTER — Other Ambulatory Visit: Payer: Self-pay | Admitting: Emergency Medicine

## 2021-07-25 ENCOUNTER — Ambulatory Visit
Admission: RE | Admit: 2021-07-25 | Discharge: 2021-07-25 | Disposition: A | Discharge status: Home / Self Care | Payer: Worker's Compensation | Source: Ambulatory Visit | Attending: Emergency Medicine | Admitting: Emergency Medicine

## 2021-07-25 DIAGNOSIS — W19XXXA Unspecified fall, initial encounter: Secondary | ICD-10-CM

## 2021-08-12 ENCOUNTER — Encounter: Payer: Self-pay | Admitting: Family Medicine

## 2021-08-12 ENCOUNTER — Ambulatory Visit: Payer: 59 | Admitting: Family Medicine

## 2021-08-12 VITALS — BP 112/72 | HR 75 | Temp 98.1°F | Resp 16 | Ht 66.0 in | Wt 172.9 lb

## 2021-08-12 DIAGNOSIS — D472 Monoclonal gammopathy: Secondary | ICD-10-CM

## 2021-08-12 DIAGNOSIS — I825Z2 Chronic embolism and thrombosis of unspecified deep veins of left distal lower extremity: Secondary | ICD-10-CM

## 2021-08-12 DIAGNOSIS — G43109 Migraine with aura, not intractable, without status migrainosus: Secondary | ICD-10-CM | POA: Diagnosis not present

## 2021-08-12 DIAGNOSIS — E559 Vitamin D deficiency, unspecified: Secondary | ICD-10-CM

## 2021-08-12 DIAGNOSIS — D6859 Other primary thrombophilia: Secondary | ICD-10-CM

## 2021-08-12 DIAGNOSIS — F411 Generalized anxiety disorder: Secondary | ICD-10-CM

## 2021-08-12 DIAGNOSIS — F341 Dysthymic disorder: Secondary | ICD-10-CM

## 2021-08-12 NOTE — Progress Notes (Signed)
Name: Tammy Garza   MRN: [id number]    DOB: 04/03/1981   Date:08/12/2021 ? ?     Progress Note ? ?Subjective ? ?Chief Complaint ? ?Follow Up ? ?HPI ? ?Dysthymia/Anxiety: She lost her mother to multiple myeloma July 18, 2011 her sister to ovarian cancer 07-18-15  youngest daughter died from complications of brain cancer in 2019-07-18, followed by her father complications of heart transplant 11-14-20 and father of oldest daughter died also Nov 14, 2020.  Daughter died  14-Sep-2020. She is seeing Dr. Arlyn Leak psychiatrist, she also has a therapist Dr. Geoffry Paradise . She is struggling now because she has been home since knee contusion a few weeks ago and has too much time to think, the main support is her boyfriend that lives 4 hours away. Worries about her teenager daughter  ? ?Chronic DVT, she takes medications as prescribed , Xarelto, no calf pain, no bruising . She has protein C deficiency . She is compliant with medication and denies side effects . Unchanged  ? ?MGUS: under the care of hematologist . Unchanged  ? ?Right knee contusion: fell at work 3 weeks ago and is not working, wearing a brace and seeing Percell Miller and Noemi Chapel -  pain currently is 3/10  ? ?Migraine headaches: she is using Ajovy and Nurtec prn now, frequency is down to about 2 episodes per months, she states she has a rash for 5 days after the injections, advised her to discuss switching brand of the medication to see if improves.  She states starts with blurred , nausea, phonophobia and photophobia. She is under the care of Dr. Jaynee Eagles  ? ?Patient Active Problem List  ? Diagnosis Date Noted  ? Chronic migraine without aura without status migrainosus, not intractable 05/09/2020  ? Family history of ovarian cancer 03/23/2020  ? Pelvic mass in female 03/23/2020  ? Abnormal mammogram 10/15/2019  ? History of hemorrhoidectomy   ? Hypocalcemia 10/13/2018  ? Protein C deficiency ([place]) 12/10/2016  ? Monoclonal gammopathy of unknown significance (MGUS) 11/30/2016  ? History of DVT (deep vein  thrombosis) 11/02/2016  ? Elevated blood protein 09/26/2016  ? Chronic deep vein thrombosis (DVT) of distal vein of left lower extremity ([place]) 09/17/2016  ? Vitamin D deficiency 07/26/2015  ? Thyroid cyst 07/26/2015  ? History of Helicobacter pylori infection 04/22/2015  ? Migraine with aura and without status migrainosus 04/22/2015  ? Shift work sleep disorder 04/22/2015  ? ? ?Past Surgical History:  ?Procedure Laterality Date  ? ABDOMINAL HYSTERECTOMY    ? ANTERIOR CRUCIATE LIGAMENT REPAIR Left   ? BREAST BIOPSY Left 10/07/2019  ? left breast calcs x clip  ? CHOLECYSTECTOMY    ? CYSTOSCOPY  12/19/2016  ? Procedure: CYSTOSCOPY;  Surgeon: Gae Dry, MD;  Location: [facility] ORS;  Service: Gynecology;;  ? ESOPHAGOGASTRODUODENOSCOPY (EGD) WITH PROPOFOL N/A 09/25/2017  ? Procedure: ESOPHAGOGASTRODUODENOSCOPY (EGD) WITH PROPOFOL;  Surgeon: Jonathon Bellows, MD;  [facility] ENDOSCOPY;  Service: Gastroenterology;  Laterality: N/A;  ? LAPAROSCOPIC HYSTERECTOMY Bilateral 12/19/2016  ? Procedure: HYSTERECTOMY TOTAL LAPAROSCOPIC BILATERAL SALPINGECTOMY;  Surgeon: Gae Dry, MD;  Location: [facility] ORS;  Service: Gynecology;  Laterality: Bilateral;  ? PLANTAR FASCIA SURGERY  2019-07-18  ? RECTAL EXAM UNDER ANESTHESIA N/A 02/26/2019  ? Procedure: RECTAL EXAM UNDER ANESTHESIA, EXCISION OF ANAL SKIN TAG;  Surgeon: Fredirick Maudlin, MD;  Location: [facility] ORS;  Service: General;  Laterality: N/A;  ? TUBAL LIGATION    ? ? ?Family History  ?Problem Relation Age of Onset  ? Cancer Mother   ?  multiple myeloma  ? Hypertension Mother   ? Obesity Mother   ? Migraines Mother   ? Heart failure Father   ? Cancer Father 22  ?     lung cancer  ? Deep vein thrombosis Father   ? Protein C deficiency Father   ? COPD Father   ? Congestive Heart Failure Father   ? Cancer Sister   ?     cervical  ? HIV Sister   ? Ovarian cancer Sister 58  ? Migraines Sister   ? Diabetes Brother   ? Hypertension Maternal Grandmother   ? COPD Paternal Grandmother   ?  Deep vein thrombosis Paternal Grandmother   ? Asthma Daughter   ? Protein C deficiency Daughter   ? Brain cancer Daughter   ? Cancer Daughter   ? Migraines Daughter   ? Protein C deficiency Daughter   ? Migraines Daughter   ? Breast cancer Neg Hx   ? ? ?Social History  ? ?Tobacco Use  ? Smoking status: Former  ?  Packs/day: 0.25  ?  Years: 5.00  ?  Pack years: 1.25  ?  Types: Cigarettes  ?  Start date: 05/15/2002  ?  Quit date: 05/16/2007  ?  Years since quitting: 14.2  ? Smokeless tobacco: Never  ?Substance Use Topics  ? Alcohol use: Yes  ?  Alcohol/week: 1.0 standard drink  ?  Types: 1 Glasses of wine per week  ?  Comment: occ wine none last 24 hrs  ? ? ? ?Current Outpatient Medications:  ?  AJOVY 225 MG/1.5ML SOAJ, INJECT 225 MG INTO THE SKIN EVERY 30 (THIRTY) DAYS., Disp: 4.5 mL, Rfl: 1 ?  Boric Acid CRYS, Place 600 mg vaginally 2 (two) times a week., Disp: 500 g, Rfl: 5 ?  clindamycin (CLEOCIN T) 1 % lotion, Apply topically every morning., Disp: , Rfl:  ?  FLUoxetine (PROZAC) 10 MG capsule, Take 10 mg by mouth daily., Disp: , Rfl:  ?  glycopyrrolate (ROBINUL) 1 MG tablet, Take 3 mg by mouth daily., Disp: , Rfl:  ?  NURTEC 75 MG TBDP, , Disp: , Rfl:  ?  prazosin (MINIPRESS) 2 MG capsule, Take 2 mg by mouth at bedtime., Disp: , Rfl:  ?  traZODone (DESYREL) 100 MG tablet, Take 100 mg by mouth at bedtime., Disp: , Rfl:  ?  tretinoin (RETIN-A) 0.025 % cream, Apply topically., Disp: , Rfl:  ?  Vitamin D, Ergocalciferol, (DRISDOL) 1.25 MG (50000 UNIT) CAPS capsule, TAKE 1 CAPSULE (50,000 UNITS TOTAL) BY MOUTH EVERY 7 (SEVEN) DAYS, Disp: 12 capsule, Rfl: 1 ?  estrogens, conjugated, (PREMARIN) 0.625 MG tablet, Take 1 tablet (0.625 mg total) by mouth daily. (Patient not taking: Reported on 08/12/2021), Disp: 90 tablet, Rfl: 3 ?  rivaroxaban (XARELTO) 20 MG TABS tablet, Take 1 tablet (20 mg total) by mouth daily., Disp: 90 tablet, Rfl: 2 ?  temazepam (RESTORIL) 15 MG capsule, Take 1 capsule (15 mg total) by mouth at bedtime as  needed for sleep. (Patient not taking: Reported on 08/12/2021), Disp: 90 capsule, Rfl: 0 ? ?Allergies  ?Allergen Reactions  ? Topamax [Topiramate] Other (See Comments)  ?  Confusion , slurred speech   ? Effexor [Venlafaxine]   ?  Gas and bloating   ? ? ?I personally reviewed active problem list, medication list, allergies, family history, social history, health maintenance with the patient/caregiver today. ? ? ?ROS ? ?Constitutional: Negative for fever or weight change.  ?Respiratory: Negative for cough and shortness  of breath.   ?Cardiovascular: Negative for chest pain or palpitations.  ?Gastrointestinal: Negative for abdominal pain, no bowel changes.  ?Musculoskeletal: positive for gait problem right knee joint swelling.  ?Skin: Negative for rash.  ?Neurological: Negative for dizziness or headache.  ?No other specific complaints in a complete review of systems (except as listed in HPI above).  ? ?Objective ? ?Vitals:  ? 08/12/21 1553  ?BP: 112/72  ?Pulse: 75  ?Resp: 16  ?Temp: 98.1 ?F (36.7 ?C)  ?TempSrc: Oral  ?SpO2: 96%  ?Weight: 172 lb 14.4 oz (78.4 kg)  ?Height: _0  (1.676 m)  ? ? ?Body mass index is 27.91 kg/m?. ? ?Physical Exam ? ?Constitutional: Patient appears well-developed and well-nourished. No distress.  ?HEENT: head atraumatic, normocephalic, pupils equal and reactive to light, neck supple, throat within normal limits ?Cardiovascular: Normal rate, regular rhythm and normal heart sounds.  No murmur heard. No BLE edema. ?Pulmonary/Chest: Effort normal and breath sounds normal. No respiratory distress. ?Abdominal: Soft.  There is no tenderness. ?Psychiatric: Patient has a normal mood and affect. behavior is normal. Judgment and thought content normal.  ?Muscular skeletal: brace on right knee  ? ? ?PHQ2/9: ? ?  08/12/2021  ?  3:58 PM 02/11/2021  ? 10:35 AM 11/10/2020  ? 11:06 AM 09/10/2020  ?  9:04 AM 07/26/2020  ?  3:06 PM  ?Depression screen PHQ 2/9  ?Decreased Interest _1 ?Down, Depressed, Hopeless  _2 ?PHQ - 2 Score _3 ?Altered sleeping _4 ?Tired, decreased energy _5 ?Change in appetite 0 0 3 0 0  ?Feeling bad or failure about yourself  0 0 0 0 0  ?Trouble concentrati

## 2021-08-22 ENCOUNTER — Other Ambulatory Visit: Payer: Self-pay | Admitting: Family Medicine

## 2021-08-22 DIAGNOSIS — Z1231 Encounter for screening mammogram for malignant neoplasm of breast: Secondary | ICD-10-CM

## 2021-09-10 ENCOUNTER — Other Ambulatory Visit: Payer: Self-pay | Admitting: Oncology

## 2021-09-11 ENCOUNTER — Other Ambulatory Visit: Payer: Self-pay | Admitting: Oncology

## 2021-09-11 MED ORDER — RIVAROXABAN 20 MG PO TABS: 20.0000 mg | ORAL_TABLET | Freq: Every day | ORAL | 3 refills | Status: DC

## 2021-09-11 NOTE — Telephone Encounter (Signed)
Someone sent it in for a refill back to back on same day- only refilled first request, not the second one due to it is duplicate xarelto ?

## 2021-09-12 ENCOUNTER — Encounter: Payer: 59 | Admitting: Family Medicine

## 2021-09-12 NOTE — Telephone Encounter (Signed)
Thanks for letting me know!

## 2021-09-26 ENCOUNTER — Ambulatory Visit
Admission: RE | Admit: 2021-09-26 | Discharge: 2021-09-26 | Disposition: A | Discharge status: Home / Self Care | Payer: 59 | Source: Ambulatory Visit | Attending: Family Medicine | Admitting: Family Medicine

## 2021-09-26 DIAGNOSIS — Z1231 Encounter for screening mammogram for malignant neoplasm of breast: Secondary | ICD-10-CM | POA: Diagnosis not present

## 2021-09-27 ENCOUNTER — Other Ambulatory Visit: Payer: Self-pay

## 2021-09-27 DIAGNOSIS — R928 Other abnormal and inconclusive findings on diagnostic imaging of breast: Secondary | ICD-10-CM

## 2021-09-28 ENCOUNTER — Other Ambulatory Visit: Payer: Self-pay | Admitting: Family Medicine

## 2021-09-28 DIAGNOSIS — N6489 Other specified disorders of breast: Secondary | ICD-10-CM

## 2021-09-28 DIAGNOSIS — R928 Other abnormal and inconclusive findings on diagnostic imaging of breast: Secondary | ICD-10-CM

## 2021-10-13 ENCOUNTER — Ambulatory Visit
Admission: RE | Admit: 2021-10-13 | Discharge: 2021-10-13 | Disposition: A | Discharge status: Home / Self Care | Payer: 59 | Source: Ambulatory Visit | Attending: Family Medicine | Admitting: Family Medicine

## 2021-10-13 DIAGNOSIS — N6489 Other specified disorders of breast: Secondary | ICD-10-CM | POA: Diagnosis present

## 2021-10-13 DIAGNOSIS — R928 Other abnormal and inconclusive findings on diagnostic imaging of breast: Secondary | ICD-10-CM | POA: Insufficient documentation

## 2021-10-24 ENCOUNTER — Telehealth: Payer: Self-pay | Admitting: *Deleted

## 2021-10-24 NOTE — Telephone Encounter (Signed)
Pharmacy has refills but PA is pending.

## 2021-10-24 NOTE — Telephone Encounter (Signed)
Patient left a voicemail this afternoon returning a call regarding her Ajovy.  She reports that she needs refills on her [facility] to [facility] [address] in [place].

## 2021-10-24 NOTE — Telephone Encounter (Signed)
Ida Rajan Key: [id number] - PA Case ID: [id number] - Rx #: [id number]  PA Ajovy Complete waiting on approval

## 2021-10-25 NOTE — Telephone Encounter (Signed)
PA Ajovy was approved  Approval dates 10/25/2021-10/26/2022    Will make patient aware of approval through [facility]

## 2021-10-27 ENCOUNTER — Other Ambulatory Visit: Payer: Self-pay | Admitting: Family Medicine

## 2021-10-27 NOTE — Telephone Encounter (Signed)
Faxed [facility] approval letter to [facility] [address]. Received a receipt of confirmation.

## 2021-11-01 ENCOUNTER — Other Ambulatory Visit: Payer: Self-pay | Admitting: Medical Oncology

## 2021-11-01 DIAGNOSIS — Z7901 Long term (current) use of anticoagulants: Secondary | ICD-10-CM

## 2021-11-01 DIAGNOSIS — Z86718 Personal history of other venous thrombosis and embolism: Secondary | ICD-10-CM

## 2021-11-01 DIAGNOSIS — D6859 Other primary thrombophilia: Secondary | ICD-10-CM

## 2021-11-02 ENCOUNTER — Ambulatory Visit: Payer: 59 | Admitting: Oncology

## 2021-11-02 ENCOUNTER — Inpatient Hospital Stay: Payer: 59 | Attending: Internal Medicine

## 2021-11-02 ENCOUNTER — Other Ambulatory Visit: Payer: 59

## 2021-11-02 ENCOUNTER — Inpatient Hospital Stay: Payer: 59 | Admitting: Medical Oncology

## 2021-11-02 VITALS — BP 104/67 | HR 59 | Temp 98.4°F | Resp 16 | Wt 175.0 lb

## 2021-11-02 DIAGNOSIS — Z86718 Personal history of other venous thrombosis and embolism: Secondary | ICD-10-CM

## 2021-11-02 DIAGNOSIS — Z7901 Long term (current) use of anticoagulants: Secondary | ICD-10-CM

## 2021-11-02 DIAGNOSIS — Z79899 Other long term (current) drug therapy: Secondary | ICD-10-CM | POA: Diagnosis not present

## 2021-11-02 DIAGNOSIS — D472 Monoclonal gammopathy: Secondary | ICD-10-CM | POA: Insufficient documentation

## 2021-11-02 DIAGNOSIS — D6859 Other primary thrombophilia: Secondary | ICD-10-CM | POA: Diagnosis not present

## 2021-11-02 DIAGNOSIS — I825Z2 Chronic embolism and thrombosis of unspecified deep veins of left distal lower extremity: Secondary | ICD-10-CM

## 2021-11-02 LAB — CBC WITH DIFFERENTIAL/PLATELET
Abs Immature Granulocytes: 0.06 10*3/uL (ref 0.00–0.07)
Basophils Absolute: 0 10*3/uL (ref 0.0–0.1)
Basophils Relative: 0 %
Eosinophils Absolute: 0.2 10*3/uL (ref 0.0–0.5)
Eosinophils Relative: 2 %
HCT: 37.2 % (ref 36.0–46.0)
Hemoglobin: 12.1 g/dL (ref 12.0–15.0)
Immature Granulocytes: 1 %
Lymphocytes Relative: 38 %
Lymphs Abs: 4.4 10*3/uL — ABNORMAL HIGH (ref 0.7–4.0)
MCH: 31.1 pg (ref 26.0–34.0)
MCHC: 32.5 g/dL (ref 30.0–36.0)
MCV: 95.6 fL (ref 80.0–100.0)
Monocytes Absolute: 1.2 10*3/uL — ABNORMAL HIGH (ref 0.1–1.0)
Monocytes Relative: 10 %
Neutro Abs: 5.7 10*3/uL (ref 1.7–7.7)
Neutrophils Relative %: 49 %
Platelets: 252 10*3/uL (ref 150–400)
RBC: 3.89 MIL/uL (ref 3.87–5.11)
RDW: 12.4 % (ref 11.5–15.5)
WBC: 11.5 10*3/uL — ABNORMAL HIGH (ref 4.0–10.5)
nRBC: 0 % (ref 0.0–0.2)

## 2021-11-02 LAB — COMPREHENSIVE METABOLIC PANEL
ALT: 12 U/L (ref 0–44)
AST: 14 U/L — ABNORMAL LOW (ref 15–41)
Albumin: 3.9 g/dL (ref 3.5–5.0)
Alkaline Phosphatase: 42 U/L (ref 38–126)
Anion gap: 5 (ref 5–15)
BUN: 12 mg/dL (ref 6–20)
CO2: 27 mmol/L (ref 22–32)
Calcium: 7.9 mg/dL — ABNORMAL LOW (ref 8.9–10.3)
Chloride: 100 mmol/L (ref 98–111)
Creatinine, Ser: 0.8 mg/dL (ref 0.44–1.00)
GFR, Estimated: >60 mL/min (ref >60)
Glucose, Bld: 77 mg/dL (ref 70–99)
Potassium: 3.9 mmol/L (ref 3.5–5.1)
Sodium: 132 mmol/L — ABNORMAL LOW (ref 135–145)
Total Bilirubin: 0.7 mg/dL (ref 0.3–1.2)
Total Protein: 7.2 g/dL (ref 6.5–8.1)

## 2021-11-02 MED ORDER — RIVAROXABAN 20 MG PO TABS
20.0000 mg | ORAL_TABLET | Freq: Every day | ORAL | 3 refills | Status: DC
Start: 2021-11-02 — End: 2022-09-25

## 2021-11-02 NOTE — Progress Notes (Signed)
Returns for 1 year follow-up. Denies any concerns or medical changes. Continues Xarelto and needs refills.

## 2021-11-02 NOTE — Progress Notes (Signed)
Hematology/Oncology Consult note [facility]  [phone] [phone]  Patient Care Team: [name], MD as PCP - General (Family Medicine) Dasher, Rayvon Char, MD (Dermatology) Orie Rout, MD as Referring Physician (Specialist) Jonathon Bellows, MD as Consulting Physician (Gastroenterology) Donneta Romberg, MD as Referring Physician (Psychiatry) Cammie Sickle, MD as Consulting Physician (Internal Medicine)   Name of the patient: Tammy Garza  [id number]  1980/09/19   Date of visit: 11/02/21  Diagnosis-history of left lower extremity DVT and protein C deficiency on chronic long-term anticoagulation  Chief complaint/ Reason for visit-routine follow-up of left lower extremity DVT on Xarelto  Heme/Onc history: Patient is a 41 year old African-American female diagnosed with left lower extremity DVT in May 2018 following a 2-1/2-hour flight.  Prior to that she was on Depo-Provera for a year.  Left lower extremity Doppler at that time showed acute appearing thrombus in the calf veins involving the posterior tibial and peroneal veins.  Patient was on Eliquis since 2018 but was switched to Xarelto starting January 2022 due to insurance issues.  Family history of DVT in her father and her daughter who subsequently died of a glioblastoma Hypercoagulable work-up on 09/26/2016 and 11/30/2016 revealed the following negative studies:  Factor V Leiden, prothrombin gene mutation, lupus anticoagulant panel, anticardiolipin antibodies, beta2-glycoprotein, protein S antigen/activity, and ATIII antigen/activity.  She has protein C deficiency.  Labs on 11/30/2016 revealed a protein C total of 40% (60 - 150%) and protein C activity of 49% (73-180%).  Repeat testing on 03/29/2017 revealed a protein C total of 51% and a protein C activity of 51%.  She has a history of MGUS when IgA monoclonal protein was found on immunofixation on SPEP back in May 2018 but  was not subsequently detected on myeloma panel on multiple repeat labs    Interval history-patient reports that she is doing well.  She continues to tolerate the Xarelto well without any bleeding episodes.  She denies any bleeding of stool.  She denies any changes in her chronic extremity and actually thinks that things have been better recently.  In terms of her hypocalcemia she is not currently taking a calcium supplement.  ECOG PS- 0 Pain scale- 0   Review of systems- Review of Systems  Constitutional:  Negative for chills, fever, malaise/fatigue and weight loss.  HENT:  Negative for congestion, ear discharge and nosebleeds.   Eyes:  Negative for blurred vision.  Respiratory:  Negative for cough, hemoptysis, sputum production, shortness of breath and wheezing.   Cardiovascular:  Negative for chest pain, palpitations, orthopnea and claudication.  Gastrointestinal:  Negative for abdominal pain, blood in stool, constipation, diarrhea, heartburn, melena, nausea and vomiting.  Genitourinary:  Negative for dysuria, flank pain, frequency, hematuria and urgency.  Musculoskeletal:  Negative for back pain, joint pain and myalgias.  Skin:  Negative for rash.  Neurological:  Negative for dizziness, tingling, focal weakness, seizures, weakness and headaches.  Endo/Heme/Allergies:  Does not bruise/bleed easily.  Psychiatric/Behavioral:  Negative for depression and suicidal ideas. The patient does not have insomnia.       Allergies  Allergen Reactions   Topamax [Topiramate] Other (See Comments)    Confusion , slurred speech    Effexor [Venlafaxine]     Gas and bloating      Past Medical History:  Diagnosis Date   Anemia    h/o   BRCA negative 01/2020   MyRisk neg except AXIN2 and RPS20 VUS   Cervical high risk human  papillomavirus (HPV) DNA test positive    Chronic gastritis    Depression    DVT (deep venous thrombosis) ([place]) 09/2016   left leg   Family history of ovarian cancer     GERD (gastroesophageal reflux disease)    H. pylori infection    History of attempted suicide    History of gastric polyp    History of kidney stones    h/o   Hyperhidrosis    Increased risk of breast cancer 01/2020   IBIS=20.5%   Migraines    Protein C deficiency ([place])    Rash    Shift work sleep disorder      Past Surgical History:  Procedure Laterality Date   ABDOMINAL HYSTERECTOMY     ANTERIOR CRUCIATE LIGAMENT REPAIR Left    BREAST BIOPSY Left 10/07/2019   left breast calcs x clip   CHOLECYSTECTOMY     CYSTOSCOPY  12/19/2016   Procedure: CYSTOSCOPY;  Surgeon: Gae Dry, MD;  Location: [facility] ORS;  Service: Gynecology;;   ESOPHAGOGASTRODUODENOSCOPY (EGD) WITH PROPOFOL N/A 09/25/2017   Procedure: ESOPHAGOGASTRODUODENOSCOPY (EGD) WITH PROPOFOL;  Surgeon: Jonathon Bellows, MD;  Location: [facility] ENDOSCOPY;  Service: Gastroenterology;  Laterality: N/A;   LAPAROSCOPIC HYSTERECTOMY Bilateral 12/19/2016   Procedure: HYSTERECTOMY TOTAL LAPAROSCOPIC BILATERAL SALPINGECTOMY;  Surgeon: Gae Dry, MD;  Location: [facility] ORS;  Service: Gynecology;  Laterality: Bilateral;   PLANTAR FASCIA SURGERY  06/2019   RECTAL EXAM UNDER ANESTHESIA N/A 02/26/2019   Procedure: RECTAL EXAM UNDER ANESTHESIA, EXCISION OF ANAL SKIN TAG;  Surgeon: Fredirick Maudlin, MD;  Location: [facility] ORS;  Service: General;  Laterality: N/A;   TUBAL LIGATION      Social History   Socioeconomic History   Marital status: Divorced    Spouse name: Not on file   Number of children: 2   Years of education: 13   Highest education level: Associate degree: occupational, Hotel manager, or vocational program  Occupational History   Occupation: Glass blower/designer   Tobacco Use   Smoking status: Former    Packs/day: 0.25    Years: 5.00    Total pack years: 1.25    Types: Cigarettes    Start date: 05/15/2002    Quit date: 05/16/2007    Years since quitting: 14.4   Smokeless tobacco: Never  Vaping Use   Vaping Use: Never used   Substance and Sexual Activity   Alcohol use: Yes    Alcohol/week: 1.0 standard drink of alcohol    Types: 1 Glasses of wine per week    Comment: occ wine none last 24 hrs   Drug use: Not Currently    Types: Marijuana   Sexual activity: Yes    Partners: Male    Birth control/protection: Surgical    Comment: hysterectomy   Other Topics Concern   Not on file  Social History Narrative   Separated since June 2016 - her youngest daughter 68 yo died of complications of glioblastoma, has an older daughter that works.     Engaged to [employer]: Low Risk  (09/10/2020)   Overall Financial Resource Strain (CARDIA)    Difficulty of Paying Living Expenses: Not very hard  Food Insecurity: No Food Insecurity (09/10/2020)   Hunger Vital Sign    Worried About Running Out of Food in the Last Year: Never true    Ran Out of Food in the Last Year: Never true  Transportation Needs: No Transportation Needs (09/10/2020)  PRAPARE - Hydrologist (Medical): No    Lack of Transportation (Non-Medical): No  Physical Activity: Inactive (09/10/2020)   Exercise Vital Sign    Days of Exercise per Week: 0 days    Minutes of Exercise per Session: 0 min  Stress: Stress Concern Present (09/10/2020)   [place]    Feeling of Stress : Very much  Social Connections: Socially Isolated (09/10/2020)   Social Connection and Isolation Panel [NHANES]    Frequency of Communication with Friends and Family: More than three times a week    Frequency of Social Gatherings with Friends and Family: Once a week    Attends Religious Services: Never    Marine scientist or Organizations: No    Attends Archivist Meetings: Never    Marital Status: Divorced  Human resources officer Violence: Not At Risk (09/10/2020)   Humiliation, Afraid, Rape, and Kick questionnaire    Fear  of Current or Ex-Partner: No    Emotionally Abused: No    Physically Abused: No    Sexually Abused: No    Family History  Problem Relation Age of Onset   Cancer Mother        multiple myeloma   Hypertension Mother    Obesity Mother    Migraines Mother    Heart failure Father    Cancer Father 52       lung cancer   Deep vein thrombosis Father    Protein C deficiency Father    COPD Father    Congestive Heart Failure Father    Cancer Sister        cervical   HIV Sister    Ovarian cancer Sister 87   Migraines Sister    Diabetes Brother    Hypertension Maternal Grandmother    COPD Paternal Grandmother    Deep vein thrombosis Paternal Grandmother    Asthma Daughter    Protein C deficiency Daughter    Brain cancer Daughter    Cancer Daughter    Migraines Daughter    Protein C deficiency Daughter    Migraines Daughter    Breast cancer Neg Hx      Current Outpatient Medications:    AJOVY 225 MG/1.5ML SOAJ, INJECT 225 MG INTO THE SKIN EVERY 30 (THIRTY) DAYS., Disp: 4.5 mL, Rfl: 1   clindamycin (CLEOCIN T) 1 % lotion, Apply topically every morning., Disp: , Rfl:    FLUoxetine (PROZAC) 20 MG tablet, Take 20 mg by mouth every morning., Disp: , Rfl:    glycopyrrolate (ROBINUL) 1 MG tablet, Take 3 mg by mouth daily., Disp: , Rfl:    NURTEC 75 MG TBDP, , Disp: , Rfl:    prazosin (MINIPRESS) 2 MG capsule, Take 2 mg by mouth at bedtime., Disp: , Rfl:    rivaroxaban (XARELTO) 20 MG TABS tablet, Take 1 tablet (20 mg total) by mouth daily., Disp: 90 tablet, Rfl: 3   traZODone (DESYREL) 100 MG tablet, Take 100 mg by mouth at bedtime., Disp: , Rfl:    tretinoin (RETIN-A) 0.025 % cream, Apply topically., Disp: , Rfl:    Vitamin D, Ergocalciferol, (DRISDOL) 1.25 MG (50000 UNIT) CAPS capsule, TAKE 1 CAPSULE (50,000 UNITS TOTAL) BY MOUTH EVERY 7 (SEVEN) DAYS, Disp: 12 capsule, Rfl: 1   Boric Acid CRYS, Place 600 mg vaginally 2 (two) times a week., Disp: 500 g, Rfl: 5   propranolol (INDERAL)  10 MG tablet, Take 5 mg  by mouth 3 (three) times daily as needed., Disp: , Rfl:   Physical exam:  Vitals:   11/02/21 1016  BP: 104/67  Pulse: (!) 59  Resp: 16  Temp: 98.4 F (36.9 C)  TempSrc: Tympanic  SpO2: 98%  Weight: 175 lb (79.4 kg)   Physical Exam Cardiovascular:     Rate and Rhythm: Normal rate and regular rhythm.     Heart sounds: Normal heart sounds.  Pulmonary:     Effort: Pulmonary effort is normal.     Breath sounds: Normal breath sounds.  Abdominal:     General: Bowel sounds are normal.     Palpations: Abdomen is soft.  Skin:    General: Skin is warm and dry.  Neurological:     Mental Status: She is alert and oriented to person, place, and time.         Latest Ref Rng & Units 11/02/2021    9:54 AM  CMP  Glucose 70 - 99 mg/dL 77   BUN 6 - 20 mg/dL 12   Creatinine 0.44 - 1.00 mg/dL 0.80   Sodium 135 - 145 mmol/L 132   Potassium 3.5 - 5.1 mmol/L 3.9   Chloride 98 - 111 mmol/L 100   CO2 22 - 32 mmol/L 27   Calcium 8.9 - 10.3 mg/dL 7.9   Total Protein 6.5 - 8.1 g/dL 7.2   Total Bilirubin 0.3 - 1.2 mg/dL 0.7   Alkaline Phos 38 - 126 U/L 42   AST 15 - 41 U/L 14   ALT 0 - 44 U/L 12       Latest Ref Rng & Units 11/02/2021    9:54 AM  CBC  WBC 4.0 - 10.5 K/uL 11.5   Hemoglobin 12.0 - 15.0 g/dL 12.1   Hematocrit 36.0 - 46.0 % 37.2   Platelets 150 - 400 K/uL 252      Assessment and plan- Patient is a 41 y.o. female with history of left lower extremity DVT in 2018 secondary to protein C deficiency on chronic anticoagulation here for routine follow-up  Patient will continue to be on lifelong anticoagulation given her history of protein C deficiency with history of left lower extremity DVT in 2018 as well as family history of thrombosis.  She was on Eliquis previously and was switched to Xarelto since January 2022 which she will continue.  Labs revealed mild anemia with a hemoglobin of 11.6 which is close to her baseline.  I will see her back in 1 year  with labs.  We will continue to provide refills for Xarelto   Visit Diagnosis 1. Current use of long term anticoagulation   2. History of DVT (deep vein thrombosis)   3. Protein C deficiency ([place])   4. Hypocalcemia   5. Chronic deep vein thrombosis (DVT) of distal vein of left lower extremity (H[facility])   6. Monoclonal gammopathy of unknown significance (MGUS)     Patient appears to be doing well.  She is tolerating her Xarelto well.  We will refill this medication x1 year.  I have asked her to increase her calcium intake via oral increase in calcium rich foods or supplementation to help with her hypocalcemia.  In terms of her distant potential history of MGUS this appears to be not repeated on multiple repeat labs.  Not obtained today however I will see if the lab can draw this for him and place a future order for protein electrophoresis.  Follow-up in 1 year or sooner as needed.  Hughie Closs PA-C  [place] [facility] 11/02/2021 10:39 AM

## 2021-11-09 ENCOUNTER — Other Ambulatory Visit: Payer: Self-pay | Admitting: Family Medicine

## 2021-11-09 DIAGNOSIS — F411 Generalized anxiety disorder: Secondary | ICD-10-CM

## 2021-11-09 DIAGNOSIS — E559 Vitamin D deficiency, unspecified: Secondary | ICD-10-CM

## 2021-11-10 NOTE — Telephone Encounter (Signed)
Requested medication (s) are due for refill today: yes  Requested medication (s) are on the active medication list: yes  Last refill:  04/29/21 #12 1 RF  Future visit scheduled: yes  Notes to clinic:  med not delegated to NT to RF   Requested Prescriptions  Pending Prescriptions Disp Refills   Vitamin D, Ergocalciferol, (DRISDOL) 1.25 MG (50000 UNIT) CAPS capsule [Pharmacy Med Name: VITAMIN D2 1.'25MG'$ (50,000 UNIT)] 12 capsule     Sig: TAKE 1 CAPSULE (50,000 UNITS TOTAL) BY MOUTH EVERY 7 (SEVEN) DAYS     Endocrinology:  Vitamins - Vitamin D Supplementation 2 Failed - 11/09/2021  6:00 PM      Failed - Manual Review: Route requests for 50,000 IU strength to the provider      Failed - Ca in normal range and within 360 days    Calcium  Date Value Ref Range Status  11/02/2021 7.9 (L) 8.9 - 10.3 mg/dL Final   Calcium, Total  Date Value Ref Range Status  04/17/2014 7.7 (L) 8.5 - 10.1 mg/dL Final         Failed - Vitamin D in normal range and within 360 days    Vit D, 25-Hydroxy  Date Value Ref Range Status  09/10/2020 55 30 - 100 ng/mL Final    Comment:    Vitamin D Status         25-OH Vitamin D: . Deficiency:                    <20 ng/mL Insufficiency:             20 - 29 ng/mL Optimal:                 > or = 30 ng/mL . For 25-OH Vitamin D testing on patients on  D2-supplementation and patients for whom quantitation  of D2 and D3 fractions is required, the QuestAssureD(TM) 25-OH VIT D, (D2,D3), LC/MS/MS is recommended: order  code [id number] (patients >22yr). See Note 1 . Note 1 . For additional information, please refer to  http://education.QuestDiagnostics.com/faq/FAQ199  (This link is being provided for informational/ educational purposes only.)          Passed - Valid encounter within last 12 months    Recent Outpatient Visits           3 months ago Migraine with aura and without status migrainosus, not intractable   [facility]ue [facility]   9 months ago Chronic deep vein thrombosis (DVT) of distal vein of left lower extremity ([facility]   [facility]   1 year ago Chronic deep vein thrombosis (DVT) of distal vein of left lower extremity ([facility]   [facility]   1 year ago Well adult exam   [facility]   1 year ago GAD (generalized anxiety disorder)   [facility]       Future Appointments             In 1 month [facility] [place]  In 4 months SSteele Sizer MD [facility]            Refused Prescriptions Disp Refills   PARoxetine (PAXIL-CR) 25 MG 24 hr tablet [Pharmacy Med Name: PAROXETINE ER 25 MG TABLET] 90 tablet     Sig: TAKE 1 TABLET (25 MG  TOTAL) BY MOUTH DAILY.     Psychiatry:  Antidepressants - SSRI Passed - 11/09/2021  6:00 PM      Passed - Valid encounter within last 6 months    Recent Outpatient Visits           3 months ago Migraine with aura and without status migrainosus, not intractable   [facility] [place], Drue Stager, MD   9 months ago Chronic deep vein thrombosis (DVT) of distal vein of left lower extremity [facility])   [facility] [name], MD   1 year ago Chronic deep vein thrombosis (DVT) of distal vein of left lower extremity [facility])   [facility] [name], MD   1 year ago Well adult exam   [facility] [name], MD   1 year ago GAD (generalized anxiety disorder)   [facility] [name], MD       Future Appointments             In 1 month Ancil Boozer, Drue Stager, MD [facility], [place]   In 4 months [name], MD [facility], [place]             traZODone (DESYREL) 50 MG tablet [Pharmacy Med Name:  TRAZODONE 50 MG TABLET] 90 tablet     Sig: TAKE 1/2 TO 1 TABLET BY MOUTH AT BEDTIME AS NEEDED FOR SLEEP     Psychiatry: Antidepressants - Serotonin Modulator Passed - 11/09/2021  6:00 PM      Passed - Valid encounter within last 6 months    Recent Outpatient Visits           3 months ago Migraine with aura and without status migrainosus, not intractable   [facility] [place], Drue Stager, MD   9 months ago Chronic deep vein thrombosis (DVT) of distal vein of left lower extremity [facility])   [facility] [name], MD   1 year ago Chronic deep vein thrombosis (DVT) of distal vein of left lower extremity [facility])   [facility] [name], MD   1 year ago Well adult exam   [facility] [name], MD   1 year ago GAD (generalized anxiety disorder)   [facility] [name], MD       Future Appointments             In 1 month Ancil Boozer, Drue Stager, MD [facility], [place]   In 4 months [name], MD [facility], [facility]

## 2021-11-10 NOTE — Telephone Encounter (Signed)
Requested Prescriptions  Pending Prescriptions Disp Refills  . Vitamin D, Ergocalciferol, (DRISDOL) 1.25 MG (50000 UNIT) CAPS capsule [Pharmacy Med Name: VITAMIN D2 1.'25MG'$ (50,000 UNIT)] 12 capsule     Sig: TAKE 1 CAPSULE (50,000 UNITS TOTAL) BY MOUTH EVERY 7 (SEVEN) DAYS     Endocrinology:  Vitamins - Vitamin D Supplementation 2 Failed - 11/09/2021  6:00 PM      Failed - Manual Review: Route requests for 50,000 IU strength to the provider      Failed - Ca in normal range and within 360 days    Calcium  Date Value Ref Range Status  11/02/2021 7.9 (L) 8.9 - 10.3 mg/dL Final   Calcium, Total  Date Value Ref Range Status  04/17/2014 7.7 (L) 8.5 - 10.1 mg/dL Final         Failed - Vitamin D in normal range and within 360 days    Vit D, 25-Hydroxy  Date Value Ref Range Status  09/10/2020 55 30 - 100 ng/mL Final    Comment:    Vitamin D Status         25-OH Vitamin D: . Deficiency:                    <20 ng/mL Insufficiency:             20 - 29 ng/mL Optimal:                 > or = 30 ng/mL . For 25-OH Vitamin D testing on patients on  D2-supplementation and patients for whom quantitation  of D2 and D3 fractions is required, the QuestAssureD(TM) 25-OH VIT D, (D2,D3), LC/MS/MS is recommended: order  code [id number] (patients >27yr). See Note 1 . Note 1 . For additional information, please refer to  http://education.QuestDiagnostics.com/faq/FAQ199  (This link is being provided for informational/ educational purposes only.)          Passed - Valid encounter within last 12 months    Recent Outpatient Visits          3 months ago Migraine with aura and without status migrainosus, not intractable   [facility] St[facility]   9 months ago Chronic deep vein thrombosis (DVT) of distal vein of left lower extremity ([facility]   [facility]   1 year ago Chronic deep vein thrombosis (DVT) of distal vein of left lower extremity  ([facility]   [facility]   1 year ago Well adult exam   [facility]   1 year ago GAD (generalized anxiety disorder)   [facility]      Future Appointments            In 1 month SAncil Boozer KDrue Stager MD [facility] [place]  In 4 months SSteele Sizer MD [facility]          . PARoxetine (PAXIL-CR) 25 MG 24 hr tablet [Pharmacy Med Name: PAROXETINE ER 25 MG TABLET] 90 tablet     Sig: TAKE 1 TABLET (25 MG TOTAL) BY MOUTH DAILY.     Psychiatry:  Antidepressants - SSRI Passed - 11/09/2021  6:00 PM      Passed - Valid encounter within last 6 months    Recent Outpatient Visits          3 months ago Migraine with aura and without status  migrainosus, not intractable   [facility] [place], Drue Stager, MD   9 months ago Chronic deep vein thrombosis (DVT) of distal vein of left lower extremity [facility])   [facility] [name], MD   1 year ago Chronic deep vein thrombosis (DVT) of distal vein of left lower extremity [facility])   [facility] [name], MD   1 year ago Well adult exam   [facility] [name], MD   1 year ago GAD (generalized anxiety disorder)   [facility] [name], MD      Future Appointments            In 1 month Ancil Boozer, Drue Stager, MD [facility], [place]   In 4 months [name], MD [facility], [place]           . traZODone (DESYREL) 50 MG tablet [Pharmacy Med Name: TRAZODONE 50 MG TABLET] 90 tablet     Sig: TAKE 1/2 TO 1 TABLET BY MOUTH AT BEDTIME AS NEEDED FOR SLEEP     Psychiatry: Antidepressants - Serotonin Modulator Passed - 11/09/2021  6:00 PM      Passed - Valid encounter within last 6 months    Recent Outpatient Visits          3 months ago Migraine with  aura and without status migrainosus, not intractable   [facility] [place], Drue Stager, MD   9 months ago Chronic deep vein thrombosis (DVT) of distal vein of left lower extremity [facility])   [facility] [name], MD   1 year ago Chronic deep vein thrombosis (DVT) of distal vein of left lower extremity [facility])   [facility] [name], MD   1 year ago Well adult exam   [facility] [name], MD   1 year ago GAD (generalized anxiety disorder)   [facility] [name], MD      Future Appointments            In 1 month Ancil Boozer, Drue Stager, MD [facility], [place]   In 4 months [name], MD [facility], [facility]

## 2021-11-14 ENCOUNTER — Other Ambulatory Visit (HOSPITAL_COMMUNITY): Payer: Self-pay

## 2021-11-14 ENCOUNTER — Other Ambulatory Visit: Payer: Self-pay

## 2021-11-14 MED ORDER — NURTEC 75 MG PO TBDP
1.0000 | ORAL_TABLET | Freq: Every day | ORAL | 0 refills | Status: DC | PRN
  Filled 2021-11-14: qty 8, 24d supply, fill #0

## 2021-11-14 NOTE — Telephone Encounter (Signed)
Spoke with pharmacist and clarified Dr. Ancil Boozer only prescribed Nurtec. Patient was notified to contact Dr. Delice Lesch for dermatological refills and Dr. Arlyn Leak for psychiatry medication refills.

## 2021-11-24 ENCOUNTER — Encounter: Payer: Self-pay | Admitting: Podiatry

## 2021-12-06 ENCOUNTER — Ambulatory Visit: Payer: 59 | Admitting: Podiatry

## 2021-12-06 DIAGNOSIS — M722 Plantar fascial fibromatosis: Secondary | ICD-10-CM

## 2021-12-06 MED ORDER — MELOXICAM 15 MG PO TABS: 15.0000 mg | ORAL_TABLET | Freq: Every day | ORAL | 0 refills | Status: DC

## 2021-12-06 MED ORDER — METHYLPREDNISOLONE 4 MG PO TBPK: ORAL_TABLET | ORAL | 0 refills | Status: DC

## 2021-12-06 NOTE — Progress Notes (Signed)
The case Subjective:  Patient ID: Tammy Garza, female    DOB: November 07, 1980,  MRN: [id number]  Chief Complaint  Patient presents with   Plantar Fasciitis     41 y.o. female presents with the above complaint.  Patient presents with follow-up of Planter fasciitis right foot.  She states that it started to hurt again.  She states that there is some tenderness when she is walking noted some improvement but overall still about the same.  Review of Systems: Negative except as noted in the HPI. Denies N/V/F/Ch.  Past Medical History:  Diagnosis Date   Anemia    h/o   BRCA negative 01/2020   MyRisk neg except AXIN2 and RPS20 VUS   Cervical high risk human papillomavirus (HPV) DNA test positive    Chronic gastritis    Depression    DVT (deep venous thrombosis) ([place]) 09/2016   left leg   Family history of ovarian cancer    GERD (gastroesophageal reflux disease)    H. pylori infection    History of attempted suicide    History of gastric polyp    History of kidney stones    h/o   Hyperhidrosis    Increased risk of breast cancer 01/2020   IBIS=20.5%   Migraines    Protein C deficiency (HCC)    Rash    Shift work sleep disorder     Current Outpatient Medications:    meloxicam (MOBIC) 15 MG tablet, Take 1 tablet (15 mg total) by mouth daily., Disp: 30 tablet, Rfl: 0   methylPREDNISolone (MEDROL DOSEPAK) 4 MG TBPK tablet, Take as directed, Disp: 21 each, Rfl: 0   AJOVY 225 MG/1.5ML SOAJ, INJECT 225 MG INTO THE SKIN EVERY 30 (THIRTY) DAYS., Disp: 4.5 mL, Rfl: 1   clindamycin (CLEOCIN T) 1 % lotion, Apply topically every morning., Disp: , Rfl:    FLUoxetine (PROZAC) 20 MG tablet, Take 20 mg by mouth every morning., Disp: , Rfl:    glycopyrrolate (ROBINUL) 1 MG tablet, Take 3 mg by mouth daily., Disp: , Rfl:    NURTEC 75 MG TBDP, Dissolve 1 tablet by mouth daily as needed., Disp: 30 tablet, Rfl: 0   prazosin (MINIPRESS) 2 MG capsule, Take 2 mg by mouth at bedtime., Disp: , Rfl:     propranolol (INDERAL) 10 MG tablet, Take 5 mg by mouth 3 (three) times daily as needed., Disp: , Rfl:    rivaroxaban (XARELTO) 20 MG TABS tablet, Take 1 tablet (20 mg total) by mouth daily., Disp: 90 tablet, Rfl: 3   traZODone (DESYREL) 100 MG tablet, Take 100 mg by mouth at bedtime., Disp: , Rfl:    tretinoin (RETIN-A) 0.025 % cream, Apply topically., Disp: , Rfl:    Vitamin D, Ergocalciferol, (DRISDOL) 1.25 MG (50000 UNIT) CAPS capsule, TAKE 1 CAPSULE (50,000 UNITS TOTAL) BY MOUTH EVERY 7 (SEVEN) DAYS, Disp: 12 capsule, Rfl: 0  Social History   Tobacco Use  Smoking Status Former   Packs/day: 0.25   Years: 5.00   Total pack years: 1.25   Types: Cigarettes   Start date: 05/15/2002   Quit date: 05/16/2007   Years since quitting: 14.5  Smokeless Tobacco Never    Allergies  Allergen Reactions   Topamax [Topiramate] Other (See Comments)    Confusion , slurred speech    Effexor [Venlafaxine]     Gas and bloating    Objective:  There were no vitals filed for this visit. There is no height or weight on file to calculate  BMI. Constitutional Well developed. Well nourished.  Vascular Dorsalis pedis pulses palpable bilaterally. Posterior tibial pulses palpable bilaterally. Capillary refill normal to all digits.  No cyanosis or clubbing noted. Pedal hair growth normal.  Neurologic Normal speech. Oriented to person, place, and time. Epicritic sensation to light touch grossly present bilaterally.  Dermatologic Nails well groomed and normal in appearance. No open wounds. No skin lesions.  Orthopedic: Mild pain on palpation to the right Achilles tendon insertion.  Palpable Haglund's deformity noted.  Tight gastrocnemius equinus noted.  Positive Silfverskiold test with gastrocnemius equinus.  No pain with range of motion at the ankle joint.  Mild pain with dorsiflexion of the ankle joint no pain with plantarflexion of the ankle joint.  No deep intra-articular ankle pain noted.  No pain of the  posterior tibial, peroneal tendon, ATFL ligament   pain on palpation to the right plantar foot at the medial calcaneal tuber.  Tight plantar fascia noted.   Radiographs: 1. Mild distal Achilles tendinosis without tear. Trace retrocalcaneal bursal fluid. 2. Trace tenosynovial fluid associated with the posteromedial ankle tendons. Assessment:   No diagnosis found.     Plan:  Patient was evaluated and treated and all questions answered.  Right Achilles tendinitis with Haglund's deformity and gastrocnemius equinus -Clinically improving with a steroid injection and cam boot immobilization.  Right Planter fasciitis with underlying foot capsulitis -Clinically her Planter fasciitis continues to be an issue.  She states she is still hurting.  I have asked her to place herself back in the boot to let it get it immobilized.  I believe she will benefit from Medrol Dosepak and meloxicam as well.  I will send both of those to the pharmacy.  If there is no improvement we will discuss surgical options to the right side.    No follow-ups on file.

## 2021-12-07 ENCOUNTER — Encounter: Payer: Self-pay | Admitting: Podiatry

## 2021-12-15 ENCOUNTER — Other Ambulatory Visit: Payer: Self-pay | Admitting: Podiatry

## 2021-12-15 ENCOUNTER — Ambulatory Visit: Payer: 59 | Admitting: Podiatry

## 2021-12-15 DIAGNOSIS — M722 Plantar fascial fibromatosis: Secondary | ICD-10-CM

## 2021-12-15 DIAGNOSIS — M7661 Achilles tendinitis, right leg: Secondary | ICD-10-CM

## 2021-12-21 ENCOUNTER — Ambulatory Visit
Admission: RE | Admit: 2021-12-21 | Discharge: 2021-12-21 | Disposition: A | Discharge status: Home / Self Care | Payer: 59 | Source: Ambulatory Visit | Attending: Podiatry | Admitting: Podiatry

## 2021-12-21 ENCOUNTER — Telehealth: Payer: Self-pay | Admitting: *Deleted

## 2021-12-21 DIAGNOSIS — Z86718 Personal history of other venous thrombosis and embolism: Secondary | ICD-10-CM

## 2021-12-21 DIAGNOSIS — M79676 Pain in unspecified toe(s): Secondary | ICD-10-CM

## 2021-12-21 DIAGNOSIS — Z7901 Long term (current) use of anticoagulants: Secondary | ICD-10-CM

## 2021-12-21 DIAGNOSIS — M7661 Achilles tendinitis, right leg: Secondary | ICD-10-CM

## 2021-12-21 DIAGNOSIS — M722 Plantar fascial fibromatosis: Secondary | ICD-10-CM

## 2021-12-21 NOTE — Telephone Encounter (Signed)
Received incoming fax Form to be completed from [facility] (surgeon: Dr. Ophelia Charter phone # [phone])  Pt requires clearance for d/c xarelto prior to right knee scop. Date of surgery TBD.  Dr. Elroy Channel patient. Judson Roch - last prescribed xarelto on behalf of Dr. Janese Banks.   Sarah- Please advise.

## 2021-12-21 NOTE — Telephone Encounter (Signed)
RN spoke with Judson Roch, [place]. Patient will require an in office apt to discuss d/c the xarelto. Sarah requested that I call pt to see if she would like to wait until Dr. Janese Banks returns to clinic on 8/21 for clearance or see the NP sooner to work pt up for this concern. Pt contacted. Pt expressed that she doesn't want to wait until Dr. Janese Banks returns to start the medical clearance process and asked for an apt tomorrow to discuss her care with NP. Apt given to patient for 915 am tom for labs and 930 am for [place], [place]. Labs entered- cbc/metc v/o Judson Roch, [place]

## 2021-12-22 ENCOUNTER — Inpatient Hospital Stay: Payer: 59 | Attending: Internal Medicine

## 2021-12-22 ENCOUNTER — Inpatient Hospital Stay (HOSPITAL_BASED_OUTPATIENT_CLINIC_OR_DEPARTMENT_OTHER): Payer: 59 | Admitting: Medical Oncology

## 2021-12-22 ENCOUNTER — Encounter: Payer: Self-pay | Admitting: Medical Oncology

## 2021-12-22 VITALS — BP 117/80 | HR 75 | Temp 97.3°F | Resp 16 | Wt 178.1 lb

## 2021-12-22 DIAGNOSIS — Z86718 Personal history of other venous thrombosis and embolism: Secondary | ICD-10-CM

## 2021-12-22 DIAGNOSIS — D6859 Other primary thrombophilia: Secondary | ICD-10-CM

## 2021-12-22 DIAGNOSIS — Z7901 Long term (current) use of anticoagulants: Secondary | ICD-10-CM

## 2021-12-22 DIAGNOSIS — Z01818 Encounter for other preprocedural examination: Secondary | ICD-10-CM

## 2021-12-22 DIAGNOSIS — Z79899 Other long term (current) drug therapy: Secondary | ICD-10-CM | POA: Insufficient documentation

## 2021-12-22 DIAGNOSIS — D472 Monoclonal gammopathy: Secondary | ICD-10-CM

## 2021-12-22 LAB — COMPREHENSIVE METABOLIC PANEL
ALT: 11 U/L (ref 0–44)
AST: 14 U/L — ABNORMAL LOW (ref 15–41)
Albumin: 4 g/dL (ref 3.5–5.0)
Alkaline Phosphatase: 41 U/L (ref 38–126)
Anion gap: 7 (ref 5–15)
BUN: 11 mg/dL (ref 6–20)
CO2: 25 mmol/L (ref 22–32)
Calcium: 8.5 mg/dL — ABNORMAL LOW (ref 8.9–10.3)
Chloride: 106 mmol/L (ref 98–111)
Creatinine, Ser: 0.71 mg/dL (ref 0.44–1.00)
GFR, Estimated: >60 mL/min (ref >60)
Glucose, Bld: 87 mg/dL (ref 70–99)
Potassium: 4 mmol/L (ref 3.5–5.1)
Sodium: 138 mmol/L (ref 135–145)
Total Bilirubin: 0.6 mg/dL (ref 0.3–1.2)
Total Protein: 7 g/dL (ref 6.5–8.1)

## 2021-12-22 LAB — CBC WITH DIFFERENTIAL/PLATELET
Abs Immature Granulocytes: 0.02 10*3/uL (ref 0.00–0.07)
Basophils Absolute: 0.1 10*3/uL (ref 0.0–0.1)
Basophils Relative: 1 %
Eosinophils Absolute: 0.2 10*3/uL (ref 0.0–0.5)
Eosinophils Relative: 2 %
HCT: 39.8 % (ref 36.0–46.0)
Hemoglobin: 12.9 g/dL (ref 12.0–15.0)
Immature Granulocytes: 0 %
Lymphocytes Relative: 37 %
Lymphs Abs: 3.1 10*3/uL (ref 0.7–4.0)
MCH: 31.4 pg (ref 26.0–34.0)
MCHC: 32.4 g/dL (ref 30.0–36.0)
MCV: 96.8 fL (ref 80.0–100.0)
Monocytes Absolute: 0.8 10*3/uL (ref 0.1–1.0)
Monocytes Relative: 9 %
Neutro Abs: 4.3 10*3/uL (ref 1.7–7.7)
Neutrophils Relative %: 51 %
Platelets: 223 10*3/uL (ref 150–400)
RBC: 4.11 MIL/uL (ref 3.87–5.11)
RDW: 12.5 % (ref 11.5–15.5)
WBC: 8.4 10*3/uL (ref 4.0–10.5)
nRBC: 0 % (ref 0.0–0.2)

## 2021-12-22 NOTE — Progress Notes (Signed)
Symptom Management [place] at [facility] [phone] [phone]  Patient Care Team: [name], MD as PCP - General (Family Medicine) Dasher, Rayvon Char, MD (Dermatology) Orie Rout, MD as Referring Physician (Specialist) Jonathon Bellows, MD as Consulting Physician (Gastroenterology) Donneta Romberg, MD as Referring Physician (Psychiatry) Cammie Sickle, MD as Consulting Physician (Internal Medicine)   Name of the patient: Tammy Garza  [id number]  October 20, 1980   Date of visit: 12/22/21  Reason for Consult: Tammy Garza is a 41 y.o. female who presents today for:   Medication Management: Patient presents today to discuss anticoagulant cessation prior to upcoming surgery. She is having a arthroscopy performed on 12/28/2021 by Dr. Griffin Basil with [facility]. She is currently on Xarelto anticoagulation for history of DVT and protein C deficiency. Her DVT was in 2018 and she has not had any subsequent thromboembolic events.   Denies any neurologic complaints. Denies recent fevers or illnesses. Denies any easy bleeding or bruising. Reports good appetite and denies weight loss. Denies chest pain. Denies any nausea, vomiting, constipation, or diarrhea. Denies urinary complaints. Patient offers no further specific complaints today.    PAST MEDICAL HISTORY: Past Medical History:  Diagnosis Date   Anemia    h/o   BRCA negative 01/2020   MyRisk neg except AXIN2 and RPS20 VUS   Cervical high risk human papillomavirus (HPV) DNA test positive    Chronic gastritis    Depression    DVT (deep venous thrombosis) ([place]) 09/2016   left leg   Family history of ovarian cancer    GERD (gastroesophageal reflux disease)    H. pylori infection    History of attempted suicide    History of gastric polyp    History of kidney stones    h/o   Hyperhidrosis    Increased risk of breast cancer 01/2020   IBIS=20.5%    Migraines    Protein C deficiency ([place])    Rash    Shift work sleep disorder     PAST SURGICAL HISTORY:  Past Surgical History:  Procedure Laterality Date   ABDOMINAL HYSTERECTOMY     ANTERIOR CRUCIATE LIGAMENT REPAIR Left    BREAST BIOPSY Left 10/07/2019   left breast calcs x clip   CHOLECYSTECTOMY     CYSTOSCOPY  12/19/2016   Procedure: CYSTOSCOPY;  Surgeon: Gae Dry, MD;  Location: [facility] ORS;  Service: Gynecology;;   ESOPHAGOGASTRODUODENOSCOPY (EGD) WITH PROPOFOL N/A 09/25/2017   Procedure: ESOPHAGOGASTRODUODENOSCOPY (EGD) WITH PROPOFOL;  Surgeon: Jonathon Bellows, MD;  Location: [facility] ENDOSCOPY;  Service: Gastroenterology;  Laterality: N/A;   LAPAROSCOPIC HYSTERECTOMY Bilateral 12/19/2016   Procedure: HYSTERECTOMY TOTAL LAPAROSCOPIC BILATERAL SALPINGECTOMY;  Surgeon: Gae Dry, MD;  Location: [facility] ORS;  Service: Gynecology;  Laterality: Bilateral;   PLANTAR FASCIA SURGERY  06/2019   RECTAL EXAM UNDER ANESTHESIA N/A 02/26/2019   Procedure: RECTAL EXAM UNDER ANESTHESIA, EXCISION OF ANAL SKIN TAG;  Surgeon: Fredirick Maudlin, MD;  Location: [facility] ORS;  Service: General;  Laterality: N/A;   TUBAL LIGATION      HEMATOLOGY/ONCOLOGY HISTORY:  Oncology History   No history exists.    ALLERGIES:  is allergic to topamax [topiramate] and effexor [venlafaxine].  MEDICATIONS:  Current Outpatient Medications  Medication Sig Dispense Refill   AJOVY 225 MG/1.5ML SOAJ INJECT 225 MG INTO THE SKIN EVERY 30 (THIRTY) DAYS. 4.5 mL 1   clindamycin (CLEOCIN T) 1 % lotion Apply topically every morning.     FLUoxetine (PROZAC) 40 MG  capsule Take 40 mg by mouth every morning.     glycopyrrolate (ROBINUL) 1 MG tablet Take 3 mg by mouth daily.     NURTEC 75 MG TBDP Dissolve 1 tablet by mouth daily as needed. 30 tablet 0   prazosin (MINIPRESS) 2 MG capsule Take 2 mg by mouth at bedtime.     propranolol (INDERAL) 10 MG tablet Take 5 mg by mouth 3 (three) times daily as needed.     rivaroxaban  (XARELTO) 20 MG TABS tablet Take 1 tablet (20 mg total) by mouth daily. 90 tablet 3   traZODone (DESYREL) 100 MG tablet Take 100 mg by mouth at bedtime.     tretinoin (RETIN-A) 0.025 % cream Apply topically.     Vitamin D, Ergocalciferol, (DRISDOL) 1.25 MG (50000 UNIT) CAPS capsule TAKE 1 CAPSULE (50,000 UNITS TOTAL) BY MOUTH EVERY 7 (SEVEN) DAYS 12 capsule 0   FLUoxetine (PROZAC) 20 MG tablet Take 20 mg by mouth every morning. (Patient not taking: Reported on 12/22/2021)     No current facility-administered medications for this visit.    VITAL SIGNS: BP 117/80   Pulse 75   Temp (!) 97.3 F (36.3 C)   Resp 16   Wt 178 lb 1.6 oz (80.8 kg)   LMP 01/04/2016 Comment: partial hysterectomy, tubal   SpO2 98%   BMI 28.75 kg/m  Filed Weights   12/22/21 0933  Weight: 178 lb 1.6 oz (80.8 kg)    Estimated body mass index is 28.75 kg/m as calculated from the following:   Height as of 08/12/21: 5' 6"  (1.676 m).   Weight as of this encounter: 178 lb 1.6 oz (80.8 kg).  LABS: CBC:    Component Value Date/Time   WBC 8.4 12/22/2021 0913   HGB 12.9 12/22/2021 0913   HGB 12.4 07/26/2015 1244   HCT 39.8 12/22/2021 0913   HCT 37.5 07/26/2015 1244   PLT 223 12/22/2021 0913   PLT 302 07/26/2015 1244   MCV 96.8 12/22/2021 0913   MCV 93 07/26/2015 1244   MCV 98 04/17/2014 0502   NEUTROABS 4.3 12/22/2021 0913   NEUTROABS 3.8 07/26/2015 1244   NEUTROABS 6.0 04/17/2014 0502   LYMPHSABS 3.1 12/22/2021 0913   LYMPHSABS 4.2 (H) 07/26/2015 1244   LYMPHSABS 3.3 04/17/2014 0502   MONOABS 0.8 12/22/2021 0913   MONOABS 1.1 (H) 04/17/2014 0502   EOSABS 0.2 12/22/2021 0913   EOSABS 0.2 07/26/2015 1244   EOSABS 0.1 04/17/2014 0502   BASOSABS 0.1 12/22/2021 0913   BASOSABS 0.0 07/26/2015 1244   BASOSABS 0.0 04/17/2014 0502   Comprehensive Metabolic Panel:    Component Value Date/Time   NA 138 12/22/2021 0913   NA 141 07/26/2015 1244   NA 141 04/17/2014 0502   K 4.0 12/22/2021 0913   K 4.4  04/17/2014 0502   CL 106 12/22/2021 0913   CL 107 04/17/2014 0502   CO2 25 12/22/2021 0913   CO2 27 04/17/2014 0502   BUN 11 12/22/2021 0913   BUN 6 07/26/2015 1244   BUN 4 (L) 04/17/2014 0502   CREATININE 0.71 12/22/2021 0913   CREATININE 0.66 09/10/2020 0950   GLUCOSE 87 12/22/2021 0913   GLUCOSE 81 04/17/2014 0502   CALCIUM 8.5 (L) 12/22/2021 0913   CALCIUM 7.7 (L) 04/17/2014 0502   AST 14 (L) 12/22/2021 0913   AST 14 (L) 04/17/2014 0502   ALT 11 12/22/2021 0913   ALT 13 (L) 04/17/2014 0502   ALKPHOS 41 12/22/2021 0913   ALKPHOS  44 (L) 04/17/2014 0502   BILITOT 0.6 12/22/2021 0913   BILITOT 0.6 07/26/2015 1244   BILITOT 0.9 04/17/2014 0502   PROT 7.0 12/22/2021 0913   PROT 7.2 07/26/2015 1244   PROT 6.4 04/17/2014 0502   ALBUMIN 4.0 12/22/2021 0913   ALBUMIN 4.2 07/26/2015 1244   ALBUMIN 3.1 (L) 04/17/2014 0502    RADIOGRAPHIC STUDIES: MR ANKLE RIGHT WO CONTRAST  Result Date: 12/22/2021 CLINICAL DATA:  Right ankle pain. EXAM: MRI OF THE RIGHT ANKLE WITHOUT CONTRAST TECHNIQUE: Multiplanar, multisequence MR imaging of the ankle was performed. No intravenous contrast was administered. COMPARISON:  None Available. FINDINGS: TENDONS Peroneal: Peroneal longus tendon intact. Peroneal brevis intact. Posteromedial: Posterior tibial tendon intact. Flexor hallucis longus tendon intact. Flexor digitorum longus tendon intact. Anterior: Tibialis anterior tendon intact. Extensor hallucis longus tendon intact Extensor digitorum longus tendon intact. Achilles: Mild tendinosis of the distal Achilles tendon without a tear. Small amount of fluid in the retrocalcaneal bursa. Plantar Fascia: Intact. LIGAMENTS Lateral: Mild thickening of the anterior talofibular ligament likely reflecting prior injury without a complete tear. Calcaneofibular ligament intact. Posterior talofibular ligament intact. Anterior and posterior tibiofibular ligaments intact. Medial: Deltoid ligament intact. Spring ligament  intact. CARTILAGE Ankle Joint: No joint effusion. Normal ankle mortise. No chondral defect. Subtalar Joints/Sinus Tarsi: Normal subtalar joints. No subtalar joint effusion. Normal sinus tarsi. Bones: No aggressive osseous lesion. No fracture or dislocation. Soft Tissue: No fluid collection or hematoma. Muscles are normal without edema or atrophy. Tarsal tunnel is normal. IMPRESSION: 1. Mild tendinosis of the distal Achilles tendon without a tear. Mild retrocalcaneal bursitis. Electronically Signed   By: Kathreen Devoid M.D.   On: 12/22/2021 08:33    PERFORMANCE STATUS (ECOG) : 1 - Symptomatic but completely ambulatory  Review of Systems Unless otherwise noted, a complete review of systems is negative.  Physical Exam General: NAD  Assessment and Plan- Patient is a 40 y.o. female    Perioperative anticoagulation management: Patient is considered high risk for thrombotic events given her protein C deficiency despite distant history of DVT. Her surgery is considered low risk for bleeding. She is on Xeralto. Spoke with Dr. Griffin Basil regarding the work time of surgery and her bleeding risk. We have discussed the risks involved and he is concerned that her being on her on blood thinners at time of surgery increases her risk of intraarticular bleeding, pain and complications. He is also concerned about not being able to complete the surgery if she is still on this medication. Therefore we elected to have her hold her Alen Blew one day before surgery and resume 1 day after. She will not need a bridge of heparin given that she is on Xeralto.   Patient expressed understanding and was in agreement with this plan. She also understands that She can call clinic at any time with any questions, concerns, or complaints.   Thank you for allowing me to participate in the care of this very pleasant patient.   Time Total: 25  Visit consisted of counseling and education dealing with the complex and emotionally intense issues  of symptom management in the setting of serious illness.Greater than 50%  of this time was spent counseling and coordinating care related to the above assessment and plan.  Signed by: Nelwyn Salisbury, PA-C

## 2021-12-22 NOTE — Progress Notes (Signed)
Pt surgery is scheduled for next Wednesday on 8/16 and needs medical clearance to d/c xarelto.

## 2021-12-26 LAB — PROTEIN ELECTROPHORESIS, SERUM
A/G Ratio: 1.4 (ref 0.7–1.7)
Albumin ELP: 3.8 g/dL (ref 2.9–4.4)
Alpha-1-Globulin: 0.2 g/dL (ref 0.0–0.4)
Alpha-2-Globulin: 0.6 g/dL (ref 0.4–1.0)
Beta Globulin: 0.8 g/dL (ref 0.7–1.3)
Gamma Globulin: 1.2 g/dL (ref 0.4–1.8)
Globulin, Total: 2.8 g/dL (ref 2.2–3.9)
Total Protein ELP: 6.6 g/dL (ref 6.0–8.5)

## 2021-12-28 ENCOUNTER — Encounter: Payer: 59 | Admitting: Family Medicine

## 2022-01-02 ENCOUNTER — Other Ambulatory Visit: Payer: Self-pay | Admitting: Podiatry

## 2022-01-03 ENCOUNTER — Encounter: Payer: Self-pay | Admitting: *Deleted

## 2022-01-03 ENCOUNTER — Ambulatory Visit: Payer: 59 | Admitting: Podiatry

## 2022-01-03 ENCOUNTER — Encounter: Payer: Self-pay | Admitting: Podiatry

## 2022-01-03 DIAGNOSIS — M7661 Achilles tendinitis, right leg: Secondary | ICD-10-CM | POA: Diagnosis not present

## 2022-01-03 DIAGNOSIS — M722 Plantar fascial fibromatosis: Secondary | ICD-10-CM

## 2022-01-03 NOTE — Progress Notes (Signed)
Subjective:  Patient ID: Tammy Garza, female    DOB: 15-Feb-1981,  MRN: [id number]  Chief Complaint  Patient presents with   Foot Pain    Right foot pain      41 y.o. female presents with the above complaint.  Patient is also found to right Achilles tendinitis and right Planter fasciitis.  She states that the plantar fascia is doing better she still having some soreness to the Achilles tendon.  She would like to discuss another steroid injection.  She is also here to go over the MRI  Review of Systems: Negative except as noted in the HPI. Denies N/V/F/Ch.  Past Medical History:  Diagnosis Date   Anemia    h/o   BRCA negative 01/2020   MyRisk neg except AXIN2 and RPS20 VUS   Cervical high risk human papillomavirus (HPV) DNA test positive    Chronic gastritis    Depression    DVT (deep venous thrombosis) ([place]) 09/2016   left leg   Family history of ovarian cancer    GERD (gastroesophageal reflux disease)    H. pylori infection    History of attempted suicide    History of gastric polyp    History of kidney stones    h/o   Hyperhidrosis    Increased risk of breast cancer 01/2020   IBIS=20.5%   Migraines    Protein C deficiency ([place])    Rash    Shift work sleep disorder     Current Outpatient Medications:    AJOVY 225 MG/1.5ML SOAJ, INJECT 225 MG INTO THE SKIN EVERY 30 (THIRTY) DAYS., Disp: 4.5 mL, Rfl: 1   clindamycin (CLEOCIN T) 1 % lotion, Apply topically every morning., Disp: , Rfl:    FLUoxetine (PROZAC) 20 MG tablet, Take 20 mg by mouth every morning. (Patient not taking: Reported on 12/22/2021), Disp: , Rfl:    FLUoxetine (PROZAC) 40 MG capsule, Take 40 mg by mouth every morning., Disp: , Rfl:    glycopyrrolate (ROBINUL) 1 MG tablet, Take 3 mg by mouth daily., Disp: , Rfl:    NURTEC 75 MG TBDP, Dissolve 1 tablet by mouth daily as needed., Disp: 30 tablet, Rfl: 0   prazosin (MINIPRESS) 2 MG capsule, Take 2 mg by mouth at bedtime., Disp: , Rfl:    propranolol  (INDERAL) 10 MG tablet, Take 5 mg by mouth 3 (three) times daily as needed., Disp: , Rfl:    rivaroxaban (XARELTO) 20 MG TABS tablet, Take 1 tablet (20 mg total) by mouth daily., Disp: 90 tablet, Rfl: 3   traZODone (DESYREL) 100 MG tablet, Take 100 mg by mouth at bedtime., Disp: , Rfl:    tretinoin (RETIN-A) 0.025 % cream, Apply topically., Disp: , Rfl:    Vitamin D, Ergocalciferol, (DRISDOL) 1.25 MG (50000 UNIT) CAPS capsule, TAKE 1 CAPSULE (50,000 UNITS TOTAL) BY MOUTH EVERY 7 (SEVEN) DAYS, Disp: 12 capsule, Rfl: 0  Social History   Tobacco Use  Smoking Status Former   Packs/day: 0.25   Years: 5.00   Total pack years: 1.25   Types: Cigarettes   Start date: 05/15/2002   Quit date: 05/16/2007   Years since quitting: 14.6  Smokeless Tobacco Never    Allergies  Allergen Reactions   Topamax [Topiramate] Other (See Comments)    Confusion , slurred speech    Effexor [Venlafaxine]     Gas and bloating    Objective:  There were no vitals filed for this visit. There is no height or weight on file  to calculate BMI. Constitutional Well developed. Well nourished.  Vascular Dorsalis pedis pulses palpable bilaterally. Posterior tibial pulses palpable bilaterally. Capillary refill normal to all digits.  No cyanosis or clubbing noted. Pedal hair growth normal.  Neurologic Normal speech. Oriented to person, place, and time. Epicritic sensation to light touch grossly present bilaterally.  Dermatologic Nails well groomed and normal in appearance. No open wounds. No skin lesions.  Orthopedic: Pain on palpation to the right Achilles tendon insertion.  Palpable Haglund's deformity noted.  Tight gastrocnemius equinus noted.  Positive Silfverskiold test with gastrocnemius equinus.  No pain with range of motion at the ankle joint.  Pain with dorsiflexion of the ankle joint no pain with plantarflexion of the ankle joint.  No deep intra-articular ankle pain noted.  No pain of the posterior tibial,  peroneal tendon, ATFL ligament  Pain on palpation to the right plantar foot at the medial calcaneal tuber.  Tight plantar fascia noted.   Radiographs: 1. Mild tendinosis of the distal Achilles tendon without a tear. Mild retrocalcaneal bursitis. Assessment:   1. Achilles tendinitis, right leg   2. Plantar fasciitis, right      Plan:  Patient was evaluated and treated and all questions answered.  Right Achilles tendinitis with Haglund's deformity and gastrocnemius equinus -I explained to the patient the etiology of Achilles tendinitis and various treatment options were discussed.   -Continue cam boot immobilization -Given that she still has residual pain I believe she will benefit from steroid injection.  I discussed with her that given the risk of tendon rupture associated with it we will plan on doing 1 injection.  She states understanding and would like to proceed with a steroid injection. -A steroid injection was performed at right Kager's fat pad using 1% plain Lidocaine and 10 mg of Kenalog. This was well tolerated. -MRI was reviewed which shows tendinitis of the Achilles without tearing.  Right Planter fasciitis with underlying foot capsulitis -Clinically improved.  MRI was reviewed which does not show any Planter fasciitis inflammation.    No follow-ups on file.

## 2022-01-17 ENCOUNTER — Ambulatory Visit: Payer: 59 | Admitting: Podiatry

## 2022-01-31 ENCOUNTER — Ambulatory Visit: Payer: 59 | Admitting: Podiatry

## 2022-01-31 DIAGNOSIS — M7661 Achilles tendinitis, right leg: Secondary | ICD-10-CM | POA: Diagnosis not present

## 2022-01-31 NOTE — Progress Notes (Signed)
Subjective:  Patient ID: Tammy Garza, female    DOB: 1980-12-15,  MRN: [id number]  Chief Complaint  Patient presents with   Foot Pain    Right foot pain follow up  Pt stated that she is doing much better physical therapy is helping      41 y.o. female presents with the above complaint.  Patient is also found to right Achilles tendinitis and right Planter fasciitis.  She is doing a lot better.  She her pain is getting much better she is been doing stretching immobilization and icing.  She denies any other acute complaint she is ready go back to work  Review of Systems: Negative except as noted in the HPI. Denies N/V/F/Ch.  Past Medical History:  Diagnosis Date   Anemia    h/o   BRCA negative 01/2020   MyRisk neg except AXIN2 and RPS20 VUS   Cervical high risk human papillomavirus (HPV) DNA test positive    Chronic gastritis    Depression    DVT (deep venous thrombosis) ([place]) 09/2016   left leg   Family history of ovarian cancer    GERD (gastroesophageal reflux disease)    H. pylori infection    History of attempted suicide    History of gastric polyp    History of kidney stones    h/o   Hyperhidrosis    Increased risk of breast cancer 01/2020   IBIS=20.5%   Migraines    Protein C deficiency ([place])    Rash    Shift work sleep disorder     Current Outpatient Medications:    AJOVY 225 MG/1.5ML SOAJ, INJECT 225 MG INTO THE SKIN EVERY 30 (THIRTY) DAYS., Disp: 4.5 mL, Rfl: 1   clindamycin (CLEOCIN T) 1 % lotion, Apply topically every morning., Disp: , Rfl:    FLUoxetine (PROZAC) 20 MG tablet, Take 20 mg by mouth every morning. (Patient not taking: Reported on 12/22/2021), Disp: , Rfl:    FLUoxetine (PROZAC) 40 MG capsule, Take 40 mg by mouth every morning., Disp: , Rfl:    glycopyrrolate (ROBINUL) 1 MG tablet, Take 3 mg by mouth daily., Disp: , Rfl:    NURTEC 75 MG TBDP, Dissolve 1 tablet by mouth daily as needed., Disp: 30 tablet, Rfl: 0   prazosin (MINIPRESS) 2 MG  capsule, Take 2 mg by mouth at bedtime., Disp: , Rfl:    propranolol (INDERAL) 10 MG tablet, Take 5 mg by mouth 3 (three) times daily as needed., Disp: , Rfl:    rivaroxaban (XARELTO) 20 MG TABS tablet, Take 1 tablet (20 mg total) by mouth daily., Disp: 90 tablet, Rfl: 3   traZODone (DESYREL) 100 MG tablet, Take 100 mg by mouth at bedtime., Disp: , Rfl:    tretinoin (RETIN-A) 0.025 % cream, Apply topically., Disp: , Rfl:    Vitamin D, Ergocalciferol, (DRISDOL) 1.25 MG (50000 UNIT) CAPS capsule, TAKE 1 CAPSULE (50,000 UNITS TOTAL) BY MOUTH EVERY 7 (SEVEN) DAYS, Disp: 12 capsule, Rfl: 0  Social History   Tobacco Use  Smoking Status Former   Packs/day: 0.25   Years: 5.00   Total pack years: 1.25   Types: Cigarettes   Start date: 05/15/2002   Quit date: 05/16/2007   Years since quitting: 14.7  Smokeless Tobacco Never    Allergies  Allergen Reactions   Topamax [Topiramate] Other (See Comments)    Confusion , slurred speech    Effexor [Venlafaxine]     Gas and bloating    Objective:  There were  no vitals filed for this visit. There is no height or weight on file to calculate BMI. Constitutional Well developed. Well nourished.  Vascular Dorsalis pedis pulses palpable bilaterally. Posterior tibial pulses palpable bilaterally. Capillary refill normal to all digits.  No cyanosis or clubbing noted. Pedal hair growth normal.  Neurologic Normal speech. Oriented to person, place, and time. Epicritic sensation to light touch grossly present bilaterally.  Dermatologic Nails well groomed and normal in appearance. No open wounds. No skin lesions.  Orthopedic: No further pain on palpation to the right Achilles tendon insertion.  Palpable Haglund's deformity noted.  Tight gastrocnemius equinus noted.  Positive Silfverskiold test with gastrocnemius equinus.  No pain with range of motion at the ankle joint.  No pain with dorsiflexion of the ankle joint no pain with plantarflexion of the ankle  joint.  No deep intra-articular ankle pain noted.  No pain of the posterior tibial, peroneal tendon, ATFL ligament  No further pain on palpation to the right plantar foot at the medial calcaneal tuber.  Tight plantar fascia noted.   Radiographs: 1. Mild tendinosis of the distal Achilles tendon without a tear. Mild retrocalcaneal bursitis. Assessment:   No diagnosis found.    Plan:  Patient was evaluated and treated and all questions answered.  Right Achilles tendinitis with Haglund's deformity and gastrocnemius equinus -I explained to the patient the etiology of Achilles tendinitis and various treatment options were discussed.   -Clinically healed and is doing much better still little residual pain for which she would continue wearing cam boot night splint and stretching until completely resolved.  If she is unable to do that we will discuss other options include PRP injection and surgical options.  Right Planter fasciitis with underlying foot capsulitis -Clinically improved.  MRI was reviewed which does not show any Planter fasciitis inflammation.    No follow-ups on file.

## 2022-02-08 ENCOUNTER — Ambulatory Visit (INDEPENDENT_AMBULATORY_CARE_PROVIDER_SITE_OTHER): Payer: 59 | Admitting: Nurse Practitioner

## 2022-02-08 ENCOUNTER — Other Ambulatory Visit (HOSPITAL_COMMUNITY)
Admission: RE | Admit: 2022-02-08 | Discharge: 2022-02-08 | Disposition: A | Discharge status: Home / Self Care | Payer: 59 | Source: Ambulatory Visit | Attending: Nurse Practitioner | Admitting: Nurse Practitioner

## 2022-02-08 ENCOUNTER — Encounter: Payer: Self-pay | Admitting: Nurse Practitioner

## 2022-02-08 VITALS — BP 118/76 | HR 88 | Temp 98.4°F | Resp 16 | Ht 66.0 in | Wt 178.5 lb

## 2022-02-08 DIAGNOSIS — Z131 Encounter for screening for diabetes mellitus: Secondary | ICD-10-CM

## 2022-02-08 DIAGNOSIS — Z113 Encounter for screening for infections with a predominantly sexual mode of transmission: Secondary | ICD-10-CM | POA: Diagnosis present

## 2022-02-08 DIAGNOSIS — E559 Vitamin D deficiency, unspecified: Secondary | ICD-10-CM | POA: Diagnosis not present

## 2022-02-08 DIAGNOSIS — Z13220 Encounter for screening for lipoid disorders: Secondary | ICD-10-CM | POA: Diagnosis not present

## 2022-02-08 DIAGNOSIS — Z0000 Encounter for general adult medical examination without abnormal findings: Secondary | ICD-10-CM

## 2022-02-08 DIAGNOSIS — M2550 Pain in unspecified joint: Secondary | ICD-10-CM

## 2022-02-08 NOTE — Progress Notes (Signed)
Name: Tammy Garza   MRN: [id number]    DOB: January 23, 1981   Date:02/08/2022       Progress Note  Subjective  Chief Complaint  Chief Complaint  Patient presents with   Annual Exam    HPI  Patient presents for annual CPE.  Diet: well balanced diet, she says she is working on her calcium intake Exercise: physical therapy due to injuries, she is going to start walking more.  She does currently walk her dog.   Sleep: 5 hours Last dental exam:January 2023 Last eye exam: this year  [place] Visit from 08/12/2021 in [facility]  AUDIT-C Score 1      Depression: Phq 9 is  positive, she is currently on medication Prozac 40 mg daily.     02/08/2022   10:14 AM 08/12/2021    3:58 PM 02/11/2021   10:35 AM 11/10/2020   11:06 AM 09/10/2020    9:04 AM  Depression screen PHQ 2/9  Decreased Interest _0 Down, Depressed, Hopeless 0 _1 PHQ - 2 Score _2 Altered sleeping _3 Tired, decreased energy _4 Change in appetite 1 0 0 3 0  Feeling bad or failure about yourself  0 0 0 0 0  Trouble concentrating _5 0  Moving slowly or fidgety/restless 0 0 0 0 0  Suicidal thoughts 0 0 0 0 0  PHQ-9 Score _6 Difficult doing work/chores Somewhat difficult Somewhat difficult      Hypertension: BP Readings from Last 3 Encounters:  02/08/22 118/76  12/22/21 117/80  11/02/21 104/67   Obesity: Wt Readings from Last 3 Encounters:  02/08/22 178 lb 8 oz (81 kg)  12/22/21 178 lb 1.6 oz (80.8 kg)  11/02/21 175 lb (79.4 kg)   BMI Readings from Last 3 Encounters:  02/08/22 28.81 kg/m  12/22/21 28.75 kg/m  11/02/21 28.25 kg/m     Vaccines:  HPV: up to at age 79 , ask insurance if age between 57-45  Shingrix: 88-64 yo and ask insurance if covered when patient above 45 yo Pneumonia:  educated and discussed with patient. Flu:  educated and discussed with patient.  Hep C Screening: 09/06/2018 STD testing and prevention  (HIV/chl/gon/syphilis): 09/06/2018 Intimate partner violence:none Sexual History :yes Menstrual History/LMP/Abnormal Bleeding: hysterectomy, no bleeding Incontinence Symptoms: none  Breast cancer:  - Last Mammogram: 09/26/2021 - BRCA gene screening: sister ovarian cancer, seen by Dr. Kenton Kingfisher and negative genetic screen   Osteoporosis: Discussed high calcium and vitamin D supplementation, weight bearing exercises  Cervical cancer screening: hysterectomy  Skin cancer: Discussed monitoring for atypical lesions  Colorectal cancer: no concerns, does not qualify   Lung cancer:   Low Dose CT Chest recommended if Age 36-80 years, 20 pack-year currently smoking OR have quit w/in 15years. Patient does not qualify.   ECG: 07/13/2017  Advanced Care Planning: A voluntary discussion about advance care planning including the explanation and discussion of advance directives.  Discussed health care proxy and Living will, and the patient was able to identify a health care proxy as daughter.  Patient does not have a living will at present time. If patient does have living will, I have requested they bring this to the clinic to be scanned in to their chart.  Lipids: Lab Results  Component Value Date   CHOL 186 09/10/2020  CHOL 184 09/10/2019   CHOL 153 09/06/2018   Lab Results  Component Value Date   HDL 67 09/10/2020   HDL 57 09/10/2019   HDL 61 09/06/2018   Lab Results  Component Value Date   LDLCALC 98 09/10/2020   LDLCALC 104 (H) 09/10/2019   LDLCALC 74 09/06/2018   Lab Results  Component Value Date   TRIG 109 09/10/2020   TRIG 124 09/10/2019   TRIG 94 09/06/2018   Lab Results  Component Value Date   CHOLHDL 2.8 09/10/2020   CHOLHDL 3.2 09/10/2019   CHOLHDL 2.5 09/06/2018   No results found for: "LDLDIRECT"  Glucose: Glucose  Date Value Ref Range Status  04/17/2014 81 65 - 99 mg/dL Final  04/16/2014 103 (H) 65 - 99 mg/dL Final  04/14/2014 77 65 - 99 mg/dL Final   Glucose, Bld   Date Value Ref Range Status  12/22/2021 87 70 - 99 mg/dL Final    Comment:    Glucose reference range applies only to samples taken after fasting for at least 8 hours.  11/02/2021 77 70 - 99 mg/dL Final    Comment:    Glucose reference range applies only to samples taken after fasting for at least 8 hours.  10/27/2020 86 70 - 99 mg/dL Final    Comment:    Glucose reference range applies only to samples taken after fasting for at least 8 hours.    Patient Active Problem List   Diagnosis Date Noted   Chronic migraine without aura without status migrainosus, not intractable 05/09/2020   Family history of ovarian cancer 03/23/2020   Pelvic mass in female 03/23/2020   Abnormal mammogram 10/15/2019   History of hemorrhoidectomy    Hypocalcemia 10/13/2018   Protein C deficiency ([place]) 12/10/2016   Monoclonal gammopathy of unknown significance (MGUS) 11/30/2016   History of DVT (deep vein thrombosis) 11/02/2016   Elevated blood protein 09/26/2016   Chronic deep vein thrombosis (DVT) of distal vein of left lower extremity ([place]) 09/17/2016   Vitamin D deficiency 07/26/2015   Thyroid cyst 22/84/0698   History of Helicobacter pylori infection 04/22/2015   Migraine with aura and without status migrainosus 04/22/2015   Shift work sleep disorder 04/22/2015    Past Surgical History:  Procedure Laterality Date   ABDOMINAL HYSTERECTOMY     ANTERIOR CRUCIATE LIGAMENT REPAIR Left    BREAST BIOPSY Left 10/07/2019   left breast calcs x clip   CHOLECYSTECTOMY     CYSTOSCOPY  12/19/2016   Procedure: CYSTOSCOPY;  Surgeon: Gae Dry, MD;  Location: [facility] ORS;  Service: Gynecology;;   ESOPHAGOGASTRODUODENOSCOPY (EGD) WITH PROPOFOL N/A 09/25/2017   Procedure: ESOPHAGOGASTRODUODENOSCOPY (EGD) WITH PROPOFOL;  Surgeon: Jonathon Bellows, MD;  Location: [facility] END[facility]COPY;  Service: Gastroenterology;  Laterality: N/A;   LAPAROSCOPIC HYSTERECTOMY Bilateral 12/19/2016   Procedure: HYSTERECTOMY TOTAL LAPAROSCOPIC  BILATERAL SALPINGECTOMY;  Surgeon: Gae Dry, MD;  Location: [facility] ORS;  Service: Gynecology;  Laterality: Bilateral;   PLANTAR FASCIA SURGERY  06/2019   RECTAL EXAM UNDER ANESTHESIA N/A 02/26/2019   Procedure: RECTAL EXAM UNDER ANESTHESIA, EXCISION OF ANAL SKIN TAG;  Surgeon: Fredirick Maudlin, MD;  Location: [facility] ORS;  Service: General;  Laterality: N/A;   TUBAL LIGATION      Family History  Problem Relation Age of Onset   Cancer Mother        multiple myeloma   Hypertension Mother    Obesity Mother    Migraines Mother    Heart failure Father  Cancer Father 71       lung cancer   Deep vein thrombosis Father    Protein C deficiency Father    COPD Father    Congestive Heart Failure Father    Cancer Sister        cervical   HIV Sister    Ovarian cancer Sister 97   Migraines Sister    Diabetes Brother    Hypertension Maternal Grandmother    COPD Paternal Grandmother    Deep vein thrombosis Paternal Grandmother    Asthma Daughter    Protein C deficiency Daughter    Brain cancer Daughter    Cancer Daughter    Migraines Daughter    Protein C deficiency Daughter    Migraines Daughter    Breast cancer Neg Hx     Social History   Socioeconomic History   Marital status: Divorced    Spouse name: Not on file   Number of children: 2   Years of education: 13   Highest education level: Associate degree: occupational, Hotel manager, or vocational program  Occupational History   Occupation: Glass blower/designer   Tobacco Use   Smoking status: Former    Packs/day: 0.25    Years: 5.00    Total pack years: 1.25    Types: Cigarettes    Start date: 05/15/2002    Quit date: 05/16/2007    Years since quitting: 14.7   Smokeless tobacco: Never  Vaping Use   Vaping Use: Never used  Substance and Sexual Activity   Alcohol use: Yes    Alcohol/week: 1.0 standard drink of alcohol    Types: 1 Glasses of wine per week    Comment: occ wine none last 24 hrs   Drug use: Not Currently     Types: Marijuana   Sexual activity: Yes    Partners: Male    Birth control/protection: Surgical    Comment: hysterectomy   Other Topics Concern   Not on file  Social History Narrative   Separated since June 2016 - her youngest daughter 8 yo died of complications of glioblastoma, has an older daughter that works.     Engaged to [employer]: Low Risk  (02/08/2022)   Overall Financial Resource Strain (CARDIA)    Difficulty of Paying Living Expenses: Not very hard  Food Insecurity: No Food Insecurity (02/08/2022)   Hunger Vital Sign    Worried About Running Out of Food in the Last Year: Never true    Ran Out of Food in the Last Year: Never true  Transportation Needs: No Transportation Needs (09/10/2020)   PRAPARE - Hydrologist (Medical): No    Lack of Transportation (Non-Medical): No  Physical Activity: Insufficiently Active (02/08/2022)   Exercise Vital Sign    Days of Exercise per Week: 1 day    Minutes of Exercise per Session: 30 min  Stress: Stress Concern Present (02/08/2022)   [place]    Feeling of Stress : Rather much  Social Connections: Moderately Isolated (02/08/2022)   Social Connection and Isolation Panel [NHANES]    Frequency of Communication with Friends and Family: More than three times a week    Frequency of Social Gatherings with Friends and Family: Once a week    Attends Religious Services: Never    Marine scientist or Organizations: Yes    Attends Archivist Meetings: 1  to 4 times per year    Marital Status: Divorced  Intimate Partner Violence: Not At Risk (02/08/2022)   Humiliation, Afraid, Rape, and Kick questionnaire    Fear of Current or Ex-Partner: No    Emotionally Abused: No    Physically Abused: No    Sexually Abused: No     Current Outpatient Medications:    AJOVY 225 MG/1.5ML SOAJ,  INJECT 225 MG INTO THE SKIN EVERY 30 (THIRTY) DAYS., Disp: 4.5 mL, Rfl: 1   clindamycin (CLEOCIN T) 1 % lotion, Apply topically every morning., Disp: , Rfl:    FLUoxetine (PROZAC) 40 MG capsule, Take 40 mg by mouth every morning., Disp: , Rfl:    glycopyrrolate (ROBINUL) 1 MG tablet, Take 3 mg by mouth daily., Disp: , Rfl:    NURTEC 75 MG TBDP, Dissolve 1 tablet by mouth daily as needed., Disp: 30 tablet, Rfl: 0   prazosin (MINIPRESS) 2 MG capsule, Take 2 mg by mouth at bedtime., Disp: , Rfl:    propranolol (INDERAL) 10 MG tablet, Take 5 mg by mouth 3 (three) times daily as needed., Disp: , Rfl:    rivaroxaban (XARELTO) 20 MG TABS tablet, Take 1 tablet (20 mg total) by mouth daily., Disp: 90 tablet, Rfl: 3   traZODone (DESYREL) 100 MG tablet, Take 100 mg by mouth at bedtime., Disp: , Rfl:    tretinoin (RETIN-A) 0.025 % cream, Apply topically., Disp: , Rfl:    Vitamin D, Ergocalciferol, (DRISDOL) 1.25 MG (50000 UNIT) CAPS capsule, TAKE 1 CAPSULE (50,000 UNITS TOTAL) BY MOUTH EVERY 7 (SEVEN) DAYS, Disp: 12 capsule, Rfl: 0  Allergies  Allergen Reactions   Topamax [Topiramate] Other (See Comments)    Confusion , slurred speech    Effexor [Venlafaxine]     Gas and bloating      ROS  Constitutional: Negative for fever or weight change.  Respiratory: Negative for cough and shortness of breath.   Cardiovascular: Negative for chest pain or palpitations.  Gastrointestinal: Negative for abdominal pain, no bowel changes.  Musculoskeletal: Negative for gait problem or joint swelling. Positive for joint pain Skin: Negative for rash.  Neurological: Negative for dizziness or headache.  No other specific complaints in a complete review of systems (except as listed in HPI above).   Objective  Vitals:   02/08/22 1010  BP: 118/76  Pulse: 88  Resp: 16  Temp: 98.4 F (36.9 C)  SpO2: 99%  Weight: 178 lb 8 oz (81 kg)  Height: _0  (1.676 m)    Body mass index is 28.81 kg/m.  Physical  Exam  Constitutional: Patient appears well-developed and well-nourished. No distress.  HENT: Head: Normocephalic and atraumatic. Ears: B TMs ok, no erythema or effusion; Nose: Nose normal. Mouth/Throat: Oropharynx is clear and moist. No oropharyngeal exudate.  Eyes: Conjunctivae and EOM are normal. Pupils are equal, round, and reactive to light. No scleral icterus.  Neck: Normal range of motion. Neck supple. No JVD present. No thyromegaly present.  Cardiovascular: Normal rate, regular rhythm and normal heart sounds.  No murmur heard. No BLE edema. Pulmonary/Chest: Effort normal and breath sounds normal. No respiratory distress. Abdominal: Soft. Bowel sounds are normal, no distension. There is no tenderness. no masses Breast: no lumps or masses, no nipple discharge or rashes Musculoskeletal: Normal range of motion, no joint effusions. No gross deformities Neurological: he is alert and oriented to person, place, and time. No cranial nerve deficit. Coordination, balance, strength, speech and gait are normal.  Skin: Skin is warm  and dry. No rash noted. No erythema.  Psychiatric: Patient has a normal mood and affect. behavior is normal. Judgment and thought content normal.   Recent Results (from the past 2160 hour(s))  Comprehensive metabolic panel     Status: Abnormal   Collection Time: 12/22/21  9:13 AM  Result Value Ref Range   Sodium 138 135 - 145 mmol/L   Potassium 4.0 3.5 - 5.1 mmol/L   Chloride 106 98 - 111 mmol/L   CO2 25 22 - 32 mmol/L   Glucose, Bld 87 70 - 99 mg/dL    Comment: Glucose reference range applies only to samples taken after fasting for at least 8 hours.   BUN 11 6 - 20 mg/dL   Creatinine, Ser 0.71 0.44 - 1.00 mg/dL   Calcium 8.5 (L) 8.9 - 10.3 mg/dL   Total Protein 7.0 6.5 - 8.1 g/dL   Albumin 4.0 3.5 - 5.0 g/dL   AST 14 (L) 15 - 41 U/L   ALT 11 0 - 44 U/L   Alkaline Phosphatase 41 38 - 126 U/L   Total Bilirubin 0.6 0.3 - 1.2 mg/dL   GFR, Estimated >60 >60 mL/min     Comment: (NOTE) Calculated using the CKD-EPI Creatinine Equation (2021)    Anion gap 7 5 - 15    Comment: Performed at [facility], [address] [place], [place] [address]  CBC with Differential     Status: None   Collection Time: 12/22/21  9:13 AM  Result Value Ref Range   WBC 8.4 4.0 - 10.5 K/uL   RBC 4.11 3.87 - 5.11 MIL/uL   Hemoglobin 12.9 12.0 - 15.0 g/dL   HCT 39.8 36.0 - 46.0 %   MCV 96.8 80.0 - 100.0 fL   MCH 31.4 26.0 - 34.0 pg   MCHC 32.4 30.0 - 36.0 g/dL   RDW 12.5 11.5 - 15.5 %   Platelets 223 150 - 400 K/uL   nRBC 0.0 0.0 - 0.2 %   Neutrophils Relative % 51 %   Neutro Abs 4.3 1.7 - 7.7 K/uL   Lymphocytes Relative 37 %   Lymphs Abs 3.1 0.7 - 4.0 K/uL   Monocytes Relative 9 %   Monocytes Absolute 0.8 0.1 - 1.0 K/uL   Eosinophils Relative 2 %   Eosinophils Absolute 0.2 0.0 - 0.5 K/uL   Basophils Relative 1 %   Basophils Absolute 0.1 0.0 - 0.1 K/uL   Immature Granulocytes 0 %   Abs Immature Granulocytes 0.02 0.00 - 0.07 K/uL    Comment: Performed at [facility], [place], [place] [address]  Protein electrophoresis, serum     Status: None   Collection Time: 12/22/21  9:13 AM  Result Value Ref Range   Total Protein ELP 6.6 6.0 - 8.5 g/dL   Albumin ELP 3.8 2.9 - 4.4 g/dL   Alpha-1-Globulin 0.2 0.0 - 0.4 g/dL   Alpha-2-Globulin 0.6 0.4 - 1.0 g/dL   Beta Globulin 0.8 0.7 - 1.3 g/dL   Gamma Globulin 1.2 0.4 - 1.8 g/dL   M-Spike, % Not Observed Not Observed g/dL   SPE Interp. Comment     Comment: (NOTE) The SPE pattern appears unremarkable. Evidence of monoclonal protein is not apparent. Performed At: [facility] [place], [place] [id number] Rush Farmer MD MW:[id number]    Comment Comment     Comment: (NOTE) Protein electrophoresis scan will follow via computer, mail, or courier delivery.    Globulin, Total  2.8 2.2 - 3.9 g/dL   A/G Ratio 1.4 0.7 - 1.7     Fall Risk:    02/08/2022   10:13 AM  08/12/2021    3:57 PM 02/11/2021   10:35 AM 11/10/2020   11:06 AM 09/10/2020    9:00 AM  Fall Risk   Falls in the past year? 1 1 0 0 0  Number falls in past yr: 0 0 0 0 0  Injury with Fall? 1 1 0 0 0  Risk for fall due to :  History of fall(s) No Fall Risks    Follow up  Falls prevention discussed;Falls evaluation completed;Education provided Falls prevention discussed       Functional Status Survey: Is the patient deaf or have difficulty hearing?: Yes Does the patient have difficulty seeing, even when wearing glasses/contacts?: No Does the patient have difficulty concentrating, remembering, or making decisions?: Yes Does the patient have difficulty walking or climbing stairs?: Yes Does the patient have difficulty dressing or bathing?: No Does the patient have difficulty doing errands alone such as visiting a doctor's office or shopping?: No   Assessment & Plan  1. Annual physical exam  - Lipid panel - Hemoglobin A1c - VITAMIN D 25 Hydroxy (Vit-D Deficiency, Fractures)  2. Vitamin D deficiency, unspecified  - VITAMIN D 25 Hydroxy (Vit-D Deficiency, Fractures)  3. Screening for diabetes mellitus  - Hemoglobin A1c  4. Screening for cholesterol level  - Lipid panel  5. Arthralgia, unspecified joint  - Rheumatoid factor  6. Screening examination for STD (sexually transmitted disease)  - RPR - Hepatitis C antibody - HIV Antibody (routine testing w rflx) - Cervicovaginal ancillary only   -USPSTF grade A and B recommendations reviewed with patient; age-appropriate recommendations, preventive care, screening tests, etc discussed and encouraged; healthy living encouraged; see AVS for patient education given to patient -Discussed importance of 150 minutes of physical activity weekly, eat two servings of fish weekly, eat one serving of tree nuts ( cashews, pistachios, pecans, almonds.Marland Kitchen) every other day, eat 6 servings of fruit/vegetables daily and drink plenty of water and  avoid sweet beverages.

## 2022-02-09 ENCOUNTER — Other Ambulatory Visit: Payer: Self-pay | Admitting: Nurse Practitioner

## 2022-02-09 DIAGNOSIS — B379 Candidiasis, unspecified: Secondary | ICD-10-CM

## 2022-02-09 DIAGNOSIS — B9689 Other specified bacterial agents as the cause of diseases classified elsewhere: Secondary | ICD-10-CM

## 2022-02-09 LAB — CERVICOVAGINAL ANCILLARY ONLY
Bacterial Vaginitis (gardnerella): POSITIVE — AB
Candida Glabrata: NEGATIVE
Candida Vaginitis: POSITIVE — AB
Chlamydia: NEGATIVE
Comment: NEGATIVE
Comment: NEGATIVE
Comment: NEGATIVE
Comment: NEGATIVE
Comment: NEGATIVE
Comment: NORMAL
Neisseria Gonorrhea: NEGATIVE
Trichomonas: NEGATIVE

## 2022-02-09 LAB — LIPID PANEL
Cholesterol: 229 mg/dL — ABNORMAL HIGH (ref <200)
HDL: 83 mg/dL (ref >=50)
LDL Cholesterol (Calc): 126 mg/dL (calc) — ABNORMAL HIGH
Non-HDL Cholesterol (Calc): 146 mg/dL (calc) — ABNORMAL HIGH (ref <130)
Total CHOL/HDL Ratio: 2.8 (calc) (ref <5.0)
Triglycerides: 101 mg/dL (ref <150)

## 2022-02-09 LAB — HEPATITIS C ANTIBODY: Hepatitis C Ab: NONREACTIVE

## 2022-02-09 LAB — HEMOGLOBIN A1C
Hgb A1c MFr Bld: 4.8 % of total Hgb (ref <5.7)
Mean Plasma Glucose: 91 mg/dL
eAG (mmol/L): 5 mmol/L

## 2022-02-09 LAB — VITAMIN D 25 HYDROXY (VIT D DEFICIENCY, FRACTURES): Vit D, 25-Hydroxy: 62 ng/mL (ref 30–100)

## 2022-02-09 LAB — HIV ANTIBODY (ROUTINE TESTING W REFLEX): HIV 1&2 Ab, 4th Generation: NONREACTIVE

## 2022-02-09 LAB — RHEUMATOID FACTOR: Rheumatoid fact SerPl-aCnc: <14 IU/mL (ref <14)

## 2022-02-09 LAB — RPR: RPR Ser Ql: NONREACTIVE

## 2022-02-09 MED ORDER — METRONIDAZOLE 500 MG PO TABS: 500.0000 mg | ORAL_TABLET | Freq: Two times a day (BID) | ORAL | 0 refills | Status: AC

## 2022-02-09 MED ORDER — FLUCONAZOLE 150 MG PO TABS: 150.0000 mg | ORAL_TABLET | ORAL | 0 refills | Status: DC | PRN

## 2022-02-10 ENCOUNTER — Other Ambulatory Visit: Payer: Self-pay | Admitting: Nurse Practitioner

## 2022-02-10 ENCOUNTER — Encounter: Payer: Self-pay | Admitting: Nurse Practitioner

## 2022-02-10 DIAGNOSIS — B379 Candidiasis, unspecified: Secondary | ICD-10-CM

## 2022-02-10 DIAGNOSIS — B9689 Other specified bacterial agents as the cause of diseases classified elsewhere: Secondary | ICD-10-CM

## 2022-02-10 MED ORDER — BORIC ACID CRYS: 600.0000 mg | CRYSTALS | 3 refills | Status: DC

## 2022-03-02 ENCOUNTER — Encounter: Payer: Self-pay | Admitting: Adult Health

## 2022-03-06 ENCOUNTER — Other Ambulatory Visit: Payer: Self-pay

## 2022-03-06 DIAGNOSIS — G43109 Migraine with aura, not intractable, without status migrainosus: Secondary | ICD-10-CM

## 2022-03-06 DIAGNOSIS — G43709 Chronic migraine without aura, not intractable, without status migrainosus: Secondary | ICD-10-CM

## 2022-03-06 MED ORDER — AJOVY 225 MG/1.5ML ~~LOC~~ SOAJ: 225.0000 mg | SUBCUTANEOUS | 2 refills | Status: DC

## 2022-03-06 MED ORDER — NURTEC 75 MG PO TBDP: 1.0000 | ORAL_TABLET | Freq: Every day | ORAL | 1 refills | Status: DC | PRN

## 2022-03-10 ENCOUNTER — Ambulatory Visit: Payer: 59 | Admitting: Family Medicine

## 2022-03-12 ENCOUNTER — Other Ambulatory Visit: Payer: Self-pay | Admitting: Podiatry

## 2022-03-23 ENCOUNTER — Encounter: Payer: Self-pay | Admitting: Podiatry

## 2022-03-24 ENCOUNTER — Other Ambulatory Visit: Payer: Self-pay | Admitting: Podiatry

## 2022-03-24 MED ORDER — OXYCODONE-ACETAMINOPHEN 5-325 MG PO TABS: 1.0000 | ORAL_TABLET | ORAL | 0 refills | Status: DC | PRN

## 2022-04-11 NOTE — Progress Notes (Unsigned)
Name: Tammy Garza   MRN: [id number]    DOB: 07/15/1980   Date:04/12/2022       Progress Note  Subjective  Chief Complaint  Follow Up  HPI  Dysthymia/Anxiety: She lost her mother to multiple myeloma Jul 05, 2011 her sister to ovarian cancer Jul 05, 2015  youngest daughter died from complications of brain cancer in 2019/07/05, followed by her father complications of heart transplant 11-01-2020 and father of oldest daughter died also November 01, 2020.  Daughter died  Sep 01, 2020. She is seeing Dr. Arlyn Leak psychiatrist, she also has a therapist Dr. Geoffry Paradise . She misses her daughter even more this time of the years, has intermittent crying spells, but able to go to work and take care of herself and her house.   Chronic DVT/Protein C deficiency , she takes medications as prescribed , Xarelto, no calf pain, no bruising . She has protein C deficiency . She is compliant with medication and denies side effects, she knows that she is not able to take NSAID's .   Dyslipidemia: she states she was not eating healthy at the time she had labs done.   The 10-year ASCVD risk score (Arnett DK, et al., 2017/07/04) is: 0.1%   Values used to calculate the score:     Age: 41 years     Sex: Female     Is Non-Hispanic African American: Yes     Diabetic: No     Tobacco smoker: No     Systolic Blood Pressure: 628 mmHg     Is BP treated: No     HDL Cholesterol: 83 mg/dL     Total Cholesterol: 229 mg/dL   MGUS: under the care of hematologist .Stable   Vitamin D deficiency: continue supplementation, level at goal   Right achilles tendinitis: still has pain, Dr. Posey Pronto gave her oxycodone to take prn since she is unable to take nsaid's   Migraine headaches: she is using Ajovy and Nurtec prn now, frequency was  down to about 2 episodes per months but now up to 3-4 times per month usually before her next injection.  She states starts with blurred , nausea, phonophobia and photophobia. She is under the care of Dr. Jaynee Eagles   Patient Active Problem List    Diagnosis Date Noted   Right Achilles tendinitis 04/12/2022   GAD (generalized anxiety disorder) 04/12/2022   Chronic migraine without aura without status migrainosus, not intractable 05/09/2020   Family history of ovarian cancer 03/23/2020   Pelvic mass in female 03/23/2020   Abnormal mammogram 10/15/2019   History of hemorrhoidectomy    Hypocalcemia 10/13/2018   Protein C deficiency ([place]) 12/10/2016   Monoclonal gammopathy of unknown significance (MGUS) 11/30/2016   History of DVT (deep vein thrombosis) 11/02/2016   Elevated blood protein 09/26/2016   Chronic deep vein thrombosis (DVT) of distal vein of left lower extremity ([place]) 09/17/2016   Vitamin D deficiency 07/26/2015   Thyroid cyst 63/81/7711   History of Helicobacter pylori infection 04/22/2015   Migraine with aura and without status migrainosus 04/22/2015   Shift work sleep disorder 04/22/2015    Past Surgical History:  Procedure Laterality Date   ABDOMINAL HYSTERECTOMY     ANTERIOR CRUCIATE LIGAMENT REPAIR Left    BREAST BIOPSY Left 10/07/2019   left breast calcs x clip   CHOLECYSTECTOMY     CYSTOSCOPY  12/19/2016   Procedure: CYSTOSCOPY;  Surgeon: Gae Dry, MD;  Location: [facility] ORS;  Service: Gynecology;;   ESOPHAGOGASTRODUODENOSCOPY (EGD) WITH PROPOFOL N/A 09/25/2017  Procedure: ESOPHAGOGASTRODUODENOSCOPY (EGD) WITH PROPOFOL;  Surgeon: Jonathon Bellows, MD;  Location: [facility] ENDOSCOPY;  Service: Gastroenterology;  Laterality: N/A;   LAPAROSCOPIC HYSTERECTOMY Bilateral 12/19/2016   Procedure: HYSTERECTOMY TOTAL LAPAROSCOPIC BILATERAL SALPINGECTOMY;  Surgeon: Gae Dry, MD;  Location: [facility] ORS;  Service: Gynecology;  Laterality: Bilateral;   PLANTAR FASCIA SURGERY  06/2019   RECTAL EXAM UNDER ANESTHESIA N/A 02/26/2019   Procedure: RECTAL EXAM UNDER ANESTHESIA, EXCISION OF ANAL SKIN TAG;  Surgeon: Fredirick Maudlin, MD;  Location: [facility] ORS;  Service: General;  Laterality: N/A;   TUBAL LIGATION      Family History   Problem Relation Age of Onset   Cancer Mother        multiple myeloma   Hypertension Mother    Obesity Mother    Migraines Mother    Heart failure Father    Cancer Father 53       lung cancer   Deep vein thrombosis Father    Protein C deficiency Father    COPD Father    Congestive Heart Failure Father    Cancer Sister        cervical   HIV Sister    Ovarian cancer Sister 82   Migraines Sister    Diabetes Brother    Hypertension Maternal Grandmother    COPD Paternal Grandmother    Deep vein thrombosis Paternal Grandmother    Asthma Daughter    Protein C deficiency Daughter    Brain cancer Daughter    Cancer Daughter    Migraines Daughter    Protein C deficiency Daughter    Migraines Daughter    Breast cancer Neg Hx     Social History   Tobacco Use   Smoking status: Former    Packs/day: 0.25    Years: 5.00    Total pack years: 1.25    Types: Cigarettes    Start date: 05/15/2002    Quit date: 05/16/2007    Years since quitting: 14.9   Smokeless tobacco: Never  Substance Use Topics   Alcohol use: Yes    Alcohol/week: 1.0 standard drink of alcohol    Types: 1 Glasses of wine per week    Comment: occ wine none last 24 hrs     Current Outpatient Medications:    Boric Acid CRYS, Place 600 mg vaginally 2 (two) times a week., Disp: 500 g, Rfl: 3   clindamycin (CLEOCIN T) 1 % lotion, Apply topically every morning., Disp: , Rfl:    FLUoxetine (PROZAC) 40 MG capsule, Take 40 mg by mouth every morning., Disp: , Rfl:    Fremanezumab-vfrm (AJOVY) 225 MG/1.5ML SOAJ, Inject 225 mg into the skin every 30 (thirty) days., Disp: 4.5 mL, Rfl: 2   glycopyrrolate (ROBINUL) 1 MG tablet, Take 3 mg by mouth daily., Disp: , Rfl:    NURTEC 75 MG TBDP, Dissolve 1 tablet by mouth daily as needed., Disp: 30 tablet, Rfl: 1   oxyCODONE-acetaminophen (PERCOCET) 5-325 MG tablet, Take 1 tablet by mouth every 4 (four) hours as needed for severe pain., Disp: 30 tablet, Rfl: 0   propranolol  (INDERAL) 10 MG tablet, Take 5 mg by mouth 3 (three) times daily as needed., Disp: , Rfl:    rivaroxaban (XARELTO) 20 MG TABS tablet, Take 1 tablet (20 mg total) by mouth daily., Disp: 90 tablet, Rfl: 3   traZODone (DESYREL) 100 MG tablet, Take 100 mg by mouth at bedtime., Disp: , Rfl:    tretinoin (RETIN-A) 0.025 % cream, Apply topically., Disp: ,  Rfl:    Vitamin D, Ergocalciferol, (DRISDOL) 1.25 MG (50000 UNIT) CAPS capsule, Take 1 capsule (50,000 Units total) by mouth every 7 (seven) days., Disp: 12 capsule, Rfl: 1  Allergies  Allergen Reactions   Topamax [Topiramate] Other (See Comments)    Confusion , slurred speech    Effexor [Venlafaxine]     Gas and bloating     I personally reviewed active problem list, medication list, allergies, family history, social history, health maintenance with the patient/caregiver today.   ROS  Constitutional: Negative for fever or weight change.  Respiratory: Negative for cough and shortness of breath.   Cardiovascular: Negative for chest pain or palpitations.  Gastrointestinal: Negative for abdominal pain, no bowel changes.  Musculoskeletal: Negative for gait problem or joint swelling.  Skin: Negative for rash.  Neurological: Negative for dizziness , positive for  headache.  No other specific complaints in a complete review of systems (except as listed in HPI above).   Objective  Vitals:   04/12/22 1025  BP: 108/72  Pulse: 87  Resp: 18  Temp: (!) 97.4 F (36.3 C)  TempSrc: Oral  SpO2: 98%  Weight: 178 lb 3.2 oz (80.8 kg)  Height: _0  (1.676 m)    Body mass index is 28.76 kg/m.  Physical Exam  Constitutional: Patient appears well-developed and well-nourished.  No distress.  HEENT: head atraumatic, normocephalic, pupils equal and reactive to light, neck supple, throat within normal limits Cardiovascular: Normal rate, regular rhythm and normal heart sounds.  No murmur heard. No BLE edema. Pulmonary/Chest: Effort normal and breath  sounds normal. No respiratory distress. Abdominal: Soft.  There is no tenderness. Psychiatric: Patient has a normal mood and affect. behavior is normal. Judgment and thought content normal.    PHQ2/9:    04/12/2022   10:25 AM 02/08/2022   10:14 AM 08/12/2021    3:58 PM 02/11/2021   10:35 AM 11/10/2020   11:06 AM  Depression screen PHQ 2/9  Decreased Interest _1 Down, Depressed, Hopeless 1 0 _2 PHQ - 2 Score _3 Altered sleeping _4 Tired, decreased energy _5 Change in appetite 1 1 0 0 3  Feeling bad or failure about yourself  0 0 0 0 0  Trouble concentrating _6 Moving slowly or fidgety/restless 0 0 0 0 0  Suicidal thoughts 0 0 0 0 0  PHQ-9 Score _7 Difficult doing work/chores Very difficult Somewhat difficult Somewhat difficult      phq 9 is positive   Fall Risk:    04/12/2022   10:24 AM 02/08/2022   10:13 AM 08/12/2021    3:57 PM 02/11/2021   10:35 AM 11/10/2020   11:06 AM  Fall Risk   Falls in the past year? _8 0 0  Number falls in past yr: 0 0 0 0 0  Injury with Fall? _9 0 0  Risk for fall due to : No Fall Risks;Impaired balance/gait  History of fall(s) No Fall Risks   Follow up Falls prevention discussed;Education provided;Falls evaluation completed  Falls prevention discussed;Falls evaluation completed;Education provided Falls prevention discussed      Assessment & Plan  1. Migraine with aura and without status migrainosus, not intractable  Continue follow up with neurologist   2. Protein C deficiency ([place])  Keep follow up with hematologist  3. Chronic deep vein thrombosis (DVT) of distal vein of left lower extremity ([facility])  Continue medication  4. Monoclonal gammopathy of unknown significance (MGUS)   5. Vitamin D deficiency  - Vitamin D, Ergocalciferol, (DRISDOL) 1.25 MG (50000 UNIT) CAPS capsule; Take 1 capsule (50,000 Units total) by mouth every 7 (seven) days.  Dispense: 12 capsule; Refill:  1  6. Right Achilles tendinitis   7. GAD (generalized anxiety disorder)   Doing better

## 2022-04-12 ENCOUNTER — Ambulatory Visit: Payer: 59 | Admitting: Family Medicine

## 2022-04-12 ENCOUNTER — Encounter: Payer: Self-pay | Admitting: Family Medicine

## 2022-04-12 VITALS — BP 108/72 | HR 87 | Temp 97.4°F | Resp 18 | Ht 66.0 in | Wt 178.2 lb

## 2022-04-12 DIAGNOSIS — I825Z2 Chronic embolism and thrombosis of unspecified deep veins of left distal lower extremity: Secondary | ICD-10-CM | POA: Diagnosis not present

## 2022-04-12 DIAGNOSIS — G43109 Migraine with aura, not intractable, without status migrainosus: Secondary | ICD-10-CM

## 2022-04-12 DIAGNOSIS — D6859 Other primary thrombophilia: Secondary | ICD-10-CM

## 2022-04-12 DIAGNOSIS — D472 Monoclonal gammopathy: Secondary | ICD-10-CM | POA: Diagnosis not present

## 2022-04-12 DIAGNOSIS — E559 Vitamin D deficiency, unspecified: Secondary | ICD-10-CM

## 2022-04-12 DIAGNOSIS — M7661 Achilles tendinitis, right leg: Secondary | ICD-10-CM | POA: Insufficient documentation

## 2022-04-12 DIAGNOSIS — F411 Generalized anxiety disorder: Secondary | ICD-10-CM | POA: Insufficient documentation

## 2022-04-12 MED ORDER — VITAMIN D (ERGOCALCIFEROL) 1.25 MG (50000 UNIT) PO CAPS: 50000.0000 [IU] | ORAL_CAPSULE | ORAL | 1 refills | Status: DC

## 2022-05-04 ENCOUNTER — Ambulatory Visit: Payer: 59 | Admitting: Podiatry

## 2022-05-04 DIAGNOSIS — M7661 Achilles tendinitis, right leg: Secondary | ICD-10-CM | POA: Diagnosis not present

## 2022-05-12 NOTE — Progress Notes (Signed)
Subjective:  Patient ID: Tammy Garza, female    DOB: 1981-03-21,  MRN: [id number]  Chief Complaint  Patient presents with   Plantar Fasciitis     41 y.o. female presents with the above complaint.  Patient presents for follow-up of right Achilles tendinitis.  Patient states started to hurt again.  She states she was doing good but is coming back.  She would like to discuss PRP injection  Review of Systems: Negative except as noted in the HPI. Denies N/V/F/Ch.  Past Medical History:  Diagnosis Date   Anemia    h/o   BRCA negative 01/2020   MyRisk neg except AXIN2 and RPS20 VUS   Cervical high risk human papillomavirus (HPV) DNA test positive    Chronic gastritis    Depression    DVT (deep venous thrombosis) ([place]) 09/2016   left leg   Family history of ovarian cancer    GERD (gastroesophageal reflux disease)    H. pylori infection    History of attempted suicide    History of gastric polyp    History of kidney stones    h/o   Hyperhidrosis    Increased risk of breast cancer 01/2020   IBIS=20.5%   Migraines    Protein C deficiency ([facility])    Rash    Shift work sleep disorder     Current Outpatient Medications:    Boric Acid CRYS, Place 600 mg vaginally 2 (two) times a week., Disp: 500 g, Rfl: 3   clindamycin (CLEOCIN T) 1 % lotion, Apply topically every morning., Disp: , Rfl:    FLUoxetine (PROZAC) 40 MG capsule, Take 40 mg by mouth every morning., Disp: , Rfl:    Fremanezumab-vfrm (AJOVY) 225 MG/1.5ML SOAJ, Inject 225 mg into the skin every 30 (thirty) days., Disp: 4.5 mL, Rfl: 2   glycopyrrolate (ROBINUL) 1 MG tablet, Take 3 mg by mouth daily., Disp: , Rfl:    NURTEC 75 MG TBDP, Dissolve 1 tablet by mouth daily as needed., Disp: 30 tablet, Rfl: 1   oxyCODONE-acetaminophen (PERCOCET) 5-325 MG tablet, Take 1 tablet by mouth every 4 (four) hours as needed for severe pain., Disp: 30 tablet, Rfl: 0   propranolol (INDERAL) 10 MG tablet, Take 5 mg by mouth 3 (three) times  daily as needed., Disp: , Rfl:    rivaroxaban (XARELTO) 20 MG TABS tablet, Take 1 tablet (20 mg total) by mouth daily., Disp: 90 tablet, Rfl: 3   traZODone (DESYREL) 100 MG tablet, Take 100 mg by mouth at bedtime., Disp: , Rfl:    tretinoin (RETIN-A) 0.025 % cream, Apply topically., Disp: , Rfl:    Vitamin D, Ergocalciferol, (DRISDOL) 1.25 MG (50000 UNIT) CAPS capsule, Take 1 capsule (50,000 Units total) by mouth every 7 (seven) days., Disp: 12 capsule, Rfl: 1  Social History   Tobacco Use  Smoking Status Former   Packs/day: 0.25   Years: 5.00   Total pack years: 1.25   Types: Cigarettes   Start date: 05/15/2002   Quit date: 05/16/2007   Years since quitting: 15.0  Smokeless Tobacco Never    Allergies  Allergen Reactions   Topamax [Topiramate] Other (See Comments)    Confusion , slurred speech    Effexor [Venlafaxine]     Gas and bloating    Objective:  There were no vitals filed for this visit. There is no height or weight on file to calculate BMI. Constitutional Well developed. Well nourished.  Vascular Dorsalis pedis pulses palpable bilaterally. Posterior tibial pulses palpable  bilaterally. Capillary refill normal to all digits.  No cyanosis or clubbing noted. Pedal hair growth normal.  Neurologic Normal speech. Oriented to person, place, and time. Epicritic sensation to light touch grossly present bilaterally.  Dermatologic Nails well groomed and normal in appearance. No open wounds. No skin lesions.  Orthopedic: pain on palpation to the right Achilles tendon insertion.  Palpable Haglund's deformity noted.  Tight gastrocnemius equinus noted.  Positive Silfverskiold test with gastrocnemius equinus.  No pain with range of motion at the ankle joint.  No pain with dorsiflexion of the ankle joint no pain with plantarflexion of the ankle joint.  No deep intra-articular ankle pain noted.  No pain of the posterior tibial, peroneal tendon, ATFL ligament  No further pain on  palpation to the right plantar foot at the medial calcaneal tuber.  Tight plantar fascia noted.   Radiographs: 1. Mild tendinosis of the distal Achilles tendon without a tear. Mild retrocalcaneal bursitis. Assessment:   No diagnosis found.    Plan:  Patient was evaluated and treated and all questions answered.  Right Achilles tendinitis with Haglund's deformity and gastrocnemius equinus -I explained to the patient the etiology of Achilles tendinitis and various treatment options were discussed.   -Given that her pain continues to return she will benefit from PRP injection which was discussed.  Schedule her for PRP injection.  Right Planter fasciitis with underlying foot capsulitis -Clinically improved.  MRI was reviewed which does not show any Planter fasciitis inflammation.    No follow-ups on file.

## 2022-06-07 ENCOUNTER — Other Ambulatory Visit: Payer: 59

## 2022-07-10 ENCOUNTER — Encounter: Payer: Self-pay | Admitting: Podiatry

## 2022-07-31 ENCOUNTER — Other Ambulatory Visit: Payer: Self-pay | Admitting: Family Medicine

## 2022-07-31 DIAGNOSIS — Z1231 Encounter for screening mammogram for malignant neoplasm of breast: Secondary | ICD-10-CM

## 2022-09-12 ENCOUNTER — Encounter: Payer: Self-pay | Admitting: Obstetrics and Gynecology

## 2022-09-12 ENCOUNTER — Other Ambulatory Visit (HOSPITAL_COMMUNITY)
Admission: RE | Admit: 2022-09-12 | Discharge: 2022-09-12 | Disposition: A | Discharge status: Home / Self Care | Payer: 59 | Source: Ambulatory Visit | Attending: Obstetrics and Gynecology | Admitting: Obstetrics and Gynecology

## 2022-09-12 ENCOUNTER — Ambulatory Visit (INDEPENDENT_AMBULATORY_CARE_PROVIDER_SITE_OTHER): Payer: 59 | Admitting: Obstetrics and Gynecology

## 2022-09-12 VITALS — BP 120/90 | Ht 66.0 in | Wt 182.0 lb

## 2022-09-12 DIAGNOSIS — Z1151 Encounter for screening for human papillomavirus (HPV): Secondary | ICD-10-CM | POA: Insufficient documentation

## 2022-09-12 DIAGNOSIS — N6311 Unspecified lump in the right breast, upper outer quadrant: Secondary | ICD-10-CM

## 2022-09-12 DIAGNOSIS — Z8041 Family history of malignant neoplasm of ovary: Secondary | ICD-10-CM

## 2022-09-12 DIAGNOSIS — Z124 Encounter for screening for malignant neoplasm of cervix: Secondary | ICD-10-CM | POA: Diagnosis present

## 2022-09-12 DIAGNOSIS — Z01419 Encounter for gynecological examination (general) (routine) without abnormal findings: Secondary | ICD-10-CM

## 2022-09-12 DIAGNOSIS — Z1231 Encounter for screening mammogram for malignant neoplasm of breast: Secondary | ICD-10-CM

## 2022-09-12 DIAGNOSIS — Z01411 Encounter for gynecological examination (general) (routine) with abnormal findings: Secondary | ICD-10-CM | POA: Diagnosis not present

## 2022-09-12 DIAGNOSIS — Z9189 Other specified personal risk factors, not elsewhere classified: Secondary | ICD-10-CM

## 2022-09-12 NOTE — Patient Instructions (Signed)
I value your feedback and you entrusting us with your care. If you get a [facility] patient survey, I would appreciate you taking the time to let us know about your experience today. Thank you! ? ? ?

## 2022-09-12 NOTE — Progress Notes (Signed)
PCP:  Alba Cory, MD   Chief Complaint  Patient presents with   Gynecologic Exam    Breast tenderness x 1.5 month     HPI:      [name] is a 42 y.o. [id number] whose LMP was Patient's last menstrual period was 01/04/2016., presents today for her annual examination.  Her menses are absent due to hyst for leio/AUB. No PMB. Has terrible vasomotor sx, can't have ERT.  Hx of migraines and DVT.  Sex activity: single partner, contraception - status post hysterectomy. Has vaginal dryness, improved with lubricants.  Last Pap: 08/22/16 Results were: benign cellular changes /neg HPV DNA; POS HPV DNA 2017  Last mammogram: 10/13/21  Results were: normal--routine follow-up in 12 months; has appt through PCP There is no FH of breast cancer. There is a FH of ovarian cancer in her sister, colon cancer in her mat uncle and lost a daughter to glioblastoma. Pt is MyRisk neg except AXIN2 and RPS20 VUS 9/21. IBIS=20.5%.  The patient does do self-breast exams. Noticed a tender area RT breast intermittently for past 1 1/2 months; noticed a breast mass where tender. No erythema/trauma/nipple d/c.   Tobacco use: The patient denies current or previous tobacco use. Alcohol use: social drinker No drug use.  Exercise: moderately active  She does get adequate calcium and Vitamin D in her diet.  Patient Active Problem List   Diagnosis Date Noted   Increased risk of breast cancer 09/12/2022   Right Achilles tendinitis 04/12/2022   GAD (generalized anxiety disorder) 04/12/2022   Chronic migraine without aura without status migrainosus, not intractable 05/09/2020   Family history of ovarian cancer 03/23/2020   Pelvic mass in female 03/23/2020   Abnormal mammogram 10/15/2019   History of hemorrhoidectomy    Hypocalcemia 10/13/2018   Protein C deficiency (HCC) 12/10/2016   Monoclonal gammopathy of unknown significance (MGUS) 11/30/2016   History of DVT (deep vein thrombosis) 11/02/2016   Elevated  blood protein 09/26/2016   Chronic deep vein thrombosis (DVT) of distal vein of left lower extremity (HCC) 09/17/2016   Vitamin D deficiency 07/26/2015   Thyroid cyst 07/26/2015   History of Helicobacter pylori infection 04/22/2015   Migraine with aura and without status migrainosus 04/22/2015   Shift work sleep disorder 04/22/2015    Past Surgical History:  Procedure Laterality Date   ABDOMINAL HYSTERECTOMY     ANTERIOR CRUCIATE LIGAMENT REPAIR Left    BREAST BIOPSY Left 10/07/2019   left breast calcs x clip   CHOLECYSTECTOMY     CYSTOSCOPY  12/19/2016   Procedure: CYSTOSCOPY;  Surgeon: Nadara Mustard, MD;  Location: [facility] ORS;  Service: Gynecology;;   ESOPHAGOGASTRODUODENOSCOPY (EGD) WITH PROPOFOL N/A 09/25/2017   Procedure: ESOPHAGOGASTRODUODENOSCOPY (EGD) WITH PROPOFOL;  Surgeon: Wyline Mood, MD;  Location: [facility] ENDOSCOPY;  Service: Gastroenterology;  Laterality: N/A;   LAPAROSCOPIC HYSTERECTOMY Bilateral 12/19/2016   Procedure: HYSTERECTOMY TOTAL LAPAROSCOPIC BILATERAL SALPINGECTOMY;  Surgeon: Nadara Mustard, MD;  Location: [facility] ORS;  Service: Gynecology;  Laterality: Bilateral;   OTHER SURGICAL HISTORY     Knee surgery, 8/23 and 3/24   PLANTAR FASCIA SURGERY  06/2019   RECTAL EXAM UNDER ANESTHESIA N/A 02/26/2019   Procedure: RECTAL EXAM UNDER ANESTHESIA, EXCISION OF ANAL SKIN TAG;  Surgeon: Duanne Guess, MD;  Location: [facility] ORS;  Service: General;  Laterality: N/A;   TUBAL LIGATION      Family History  Problem Relation Age of Onset   Cancer Mother  multiple myeloma   Hypertension Mother    Obesity Mother    Migraines Mother    Heart failure Father    Cancer Father 75       lung cancer   Deep vein thrombosis Father    Protein C deficiency Father    COPD Father    Congestive Heart Failure Father    Cancer Sister        cervical   HIV Sister    Ovarian cancer Sister 31   Migraines Sister    Diabetes Brother    Hypertension Maternal Grandmother     COPD Paternal Grandmother    Deep vein thrombosis Paternal Grandmother    Asthma Daughter    Protein C deficiency Daughter    Brain cancer Daughter    Cancer Daughter    Migraines Daughter    Protein C deficiency Daughter    Migraines Daughter    Breast cancer Neg Hx     Social History   Socioeconomic History   Marital status: Divorced    Spouse name: Not on file   Number of children: 2   Years of education: 13   Highest education level: Associate degree: occupational, Scientist, product/process development, or vocational program  Occupational History   Occupation: Location manager   Tobacco Use   Smoking status: Former    Packs/day: 0.25    Years: 5.00    Additional pack years: 0.00    Total pack years: 1.25    Types: Cigarettes    Start date: 05/15/2002    Quit date: 05/16/2007    Years since quitting: 15.3   Smokeless tobacco: Never  Vaping Use   Vaping Use: Never used  Substance and Sexual Activity   Alcohol use: Yes    Alcohol/week: 1.0 standard drink of alcohol    Types: 1 Glasses of wine per week    Comment: occ wine none last 24 hrs   Drug use: Not Currently   Sexual activity: Yes    Partners: Male    Birth control/protection: Surgical    Comment: hysterectomy   Other Topics Concern   Not on file  Social History Narrative   Separated since June 2016 - her youngest daughter 31 yo died of complications of glioblastoma, has an older daughter that works.     Engaged to [employer]: Low Risk  (02/08/2022)   Overall Financial Resource Strain (CARDIA)    Difficulty of Paying Living Expenses: Not very hard  Food Insecurity: No Food Insecurity (02/08/2022)   Hunger Vital Sign    Worried About Running Out of Food in the Last Year: Never true    Ran Out of Food in the Last Year: Never true  Transportation Needs: No Transportation Needs (09/10/2020)   PRAPARE - Administrator, Civil Service (Medical): No    Lack of  Transportation (Non-Medical): No  Physical Activity: Insufficiently Active (02/08/2022)   Exercise Vital Sign    Days of Exercise per Week: 1 day    Minutes of Exercise per Session: 30 min  Stress: Stress Concern Present (02/08/2022)   [employer] of Occupational Health - Occupational Stress Questionnaire    Feeling of Stress : Rather much  Social Connections: Moderately Isolated (02/08/2022)   Social Connection and Isolation Panel [NHANES]    Frequency of Communication with Friends and Family: More than three times a week    Frequency of Social Gatherings with Friends and Family: Once  a week    Attends Religious Services: Never    Active Member of Clubs or Organizations: Yes    Attends Banker Meetings: 1 to 4 times per year    Marital Status: Divorced  Intimate Partner Violence: Not At Risk (02/08/2022)   Humiliation, Afraid, Rape, and Kick questionnaire    Fear of Current or Ex-Partner: No    Emotionally Abused: No    Physically Abused: No    Sexually Abused: No     Current Outpatient Medications:    Boric Acid CRYS, Place 600 mg vaginally 2 (two) times a week., Disp: 500 g, Rfl: 3   clindamycin (CLEOCIN T) 1 % lotion, Apply topically every morning., Disp: , Rfl:    FLUoxetine (PROZAC) 20 MG tablet, Take by mouth., Disp: , Rfl:    Fremanezumab-vfrm (AJOVY) 225 MG/1.5ML SOAJ, Inject 225 mg into the skin every 30 (thirty) days., Disp: 4.5 mL, Rfl: 2   glycopyrrolate (ROBINUL) 1 MG tablet, Take 3 mg by mouth daily., Disp: , Rfl:    hydrOXYzine (ATARAX) 10 MG tablet, SMARTSIG:1 Pill By Mouth Every Night PRN, Disp: , Rfl:    NURTEC 75 MG TBDP, Dissolve 1 tablet by mouth daily as needed., Disp: 30 tablet, Rfl: 1   propranolol (INDERAL) 10 MG tablet, Take 5 mg by mouth 3 (three) times daily as needed., Disp: , Rfl:    rivaroxaban (XARELTO) 20 MG TABS tablet, Take 1 tablet (20 mg total) by mouth daily., Disp: 90 tablet, Rfl: 3   traZODone (DESYREL) 100 MG tablet, Take 100  mg by mouth at bedtime., Disp: , Rfl:    tretinoin (RETIN-A) 0.025 % cream, Apply topically., Disp: , Rfl:    Vitamin D, Ergocalciferol, (DRISDOL) 1.25 MG (50000 UNIT) CAPS capsule, Take 1 capsule (50,000 Units total) by mouth every 7 (seven) days., Disp: 12 capsule, Rfl: 1     ROS:  Review of Systems  Constitutional:  Negative for fatigue, fever and unexpected weight change.  Respiratory:  Negative for cough, shortness of breath and wheezing.   Cardiovascular:  Negative for chest pain, palpitations and leg swelling.  Gastrointestinal:  Negative for blood in stool, constipation, diarrhea, nausea and vomiting.  Endocrine: Negative for cold intolerance, heat intolerance and polyuria.  Genitourinary:  Negative for dyspareunia, dysuria, flank pain, frequency, genital sores, hematuria, menstrual problem, pelvic pain, urgency, vaginal bleeding, vaginal discharge and vaginal pain.  Musculoskeletal:  Negative for back pain, joint swelling and myalgias.  Skin:  Negative for rash.  Neurological:  Negative for dizziness, syncope, light-headedness, numbness and headaches.  Hematological:  Negative for adenopathy.  Psychiatric/Behavioral:  Positive for agitation and dysphoric mood. Negative for confusion, sleep disturbance and suicidal ideas. The patient is not nervous/anxious.    BREAST: mass/tenderness   Objective: BP (!) 120/90   Ht 5\' 6"  (1.676 m)   Wt 182 lb (82.6 kg)   LMP 01/04/2016 Comment: partial hysterectomy, tubal   BMI 29.38 kg/m    Physical Exam Constitutional:      Appearance: She is well-developed.  Genitourinary:     Vulva normal.     Genitourinary Comments: UTERUS/CX SURG REM     Right Labia: No rash, tenderness or lesions.    Left Labia: No tenderness, lesions or rash.    Vaginal cuff intact.    No vaginal discharge, erythema or tenderness.      Right Adnexa: not tender and no mass present.    Left Adnexa: not tender and no mass present.  Cervix is absent.      Uterus is absent.  Breasts:    Right: Mass and tenderness present. No nipple discharge or skin change.     Left: No mass, nipple discharge, skin change or tenderness.  Neck:     Thyroid: No thyromegaly.  Cardiovascular:     Rate and Rhythm: Normal rate and regular rhythm.     Heart sounds: Normal heart sounds. No murmur heard. Pulmonary:     Effort: Pulmonary effort is normal.     Breath sounds: Normal breath sounds.  Chest:    Abdominal:     Palpations: Abdomen is soft.     Tenderness: There is no abdominal tenderness. There is no guarding.  Musculoskeletal:        General: Normal range of motion.     Cervical back: Normal range of motion.  Neurological:     General: No focal deficit present.     Mental Status: She is alert and oriented to person, place, and time.     Cranial Nerves: No cranial nerve deficit.  Skin:    General: Skin is warm and dry.  Psychiatric:        Mood and Affect: Mood normal.        Behavior: Behavior normal.        Thought Content: Thought content normal.        Judgment: Judgment normal.  Vitals reviewed.    Assessment/Plan: Encounter for annual routine gynecological examination  Cervical cancer screening - Plan: Cytology - PAP  Screening for HPV (human papillomavirus) - Plan: Cytology - PAP  Encounter for screening mammogram for malignant neoplasm of breast - Plan: [place] LIMITED ULTRASOUND INCLUDING AXILLA RIGHT BREAST, [place] LIMITED ULTRASOUND INCLUDING AXILLA LEFT BREAST , MM 3D DIAGNOSTIC MAMMOGRAM BILATERAL BREAST  Increased risk of breast cancer - Plan: [place] LIMITED ULTRASOUND INCLUDING AXILLA RIGHT BREAST, [place] LIMITED ULTRASOUND INCLUDING AXILLA LEFT BREAST , MM 3D DIAGNOSTIC MAMMOGRAM BILATERAL BREAST; aware of recommendations of monthly SBE, yearly CBE and mammos, as well as scr breast MRI. Will call for MRI ref prn.   Family history of ovarian cancer--pt is MyRisk neg.   Mass of upper outer quadrant of right breast - Plan: [place] LIMITED  ULTRASOUND INCLUDING AXILLA RIGHT BREAST, [place] LIMITED ULTRASOUND INCLUDING AXILLA LEFT BREAST , MM 3D DIAGNOSTIC MAMMOGRAM BILATERAL BREAST; mass at 11:30 position RT breast. Pt to call to schedule dx mammo and u/s. Will f/u with results.       GYN counsel breast self exam, mammography screening, adequate intake of calcium and vitamin D, diet and exercise     F/U  Return in about 1 year (around 09/12/2023).  Kathyjo Briere B. Tyriana Helmkamp, PA-C 09/12/2022 2:41 PM

## 2022-09-14 LAB — CYTOLOGY - PAP
Adequacy: ABSENT
Comment: NEGATIVE
Diagnosis: NEGATIVE
Diagnosis: REACTIVE
High risk HPV: NEGATIVE

## 2022-09-15 ENCOUNTER — Telehealth: Payer: Self-pay

## 2022-09-18 NOTE — Telephone Encounter (Signed)
LMTRC

## 2022-09-19 ENCOUNTER — Telehealth: Payer: Self-pay

## 2022-09-19 NOTE — Telephone Encounter (Signed)
Pt returning [facility] phone call. She's aware [facility] is seeing pt's and will call her as soon as she has time.

## 2022-09-19 NOTE — Telephone Encounter (Signed)
Spoke with pt, questions answered. 

## 2022-09-20 ENCOUNTER — Other Ambulatory Visit: Payer: Self-pay | Admitting: Adult Health

## 2022-09-20 DIAGNOSIS — G43109 Migraine with aura, not intractable, without status migrainosus: Secondary | ICD-10-CM

## 2022-09-20 DIAGNOSIS — G43709 Chronic migraine without aura, not intractable, without status migrainosus: Secondary | ICD-10-CM

## 2022-09-21 NOTE — Telephone Encounter (Signed)
LVM for patient to call back to make sooner appointment

## 2022-09-22 ENCOUNTER — Encounter: Payer: Self-pay | Admitting: Oncology

## 2022-09-22 ENCOUNTER — Other Ambulatory Visit: Payer: Self-pay | Admitting: *Deleted

## 2022-09-22 ENCOUNTER — Telehealth (INDEPENDENT_AMBULATORY_CARE_PROVIDER_SITE_OTHER): Payer: Commercial Managed Care - HMO | Admitting: Adult Health

## 2022-09-22 DIAGNOSIS — G43009 Migraine without aura, not intractable, without status migrainosus: Secondary | ICD-10-CM | POA: Diagnosis not present

## 2022-09-22 DIAGNOSIS — G43709 Chronic migraine without aura, not intractable, without status migrainosus: Secondary | ICD-10-CM

## 2022-09-22 NOTE — Progress Notes (Signed)
PATIENT: Tammy Garza DOB: 11/12/1980  REASON FOR VISIT: follow up HISTORY FROM: patient  Virtual Visit via Video Note  I connected with Alfredo Bach on 09/22/22 at 11:30 AM EDT by a video enabled telemedicine application located remotely at [facility] and verified that I am speaking with the correct person using two identifiers who was located at their own home.   I discussed the limitations of evaluation and management by telemedicine and the availability of in person appointments. The patient expressed understanding and agreed to proceed.   PATIENT: Tammy Garza DOB: 1981/02/28  REASON FOR VISIT: follow up HISTORY FROM: patient  HISTORY OF PRESENT ILLNESS: Today 09/22/22:  Tammy Garza is a 42 y.o. female with a history of Migraine headaches. Returns today for follow-up. Currently taking Ajovy but we got a message from pharmacy stating that her insurance prefers [employer].  She states that Ajovy has been working well.  Has approximately 2 headaches a month.  Can typically take Nurtec and the headache resolves within 30 minutes     03/29/21: Tammy Garza is a 42 year old female with a history of migraine headaches.  She returns today for follow-up.  She reports that her headaches continue to be well controlled with Ajovy and Nurtec.  She continues to get site reaction with her injections.  Dates that he gets red swollen and itchy.  States that it does not spread over the leg.  She has not tried hydrocortisone cream nor Benadryl.  She does state that when we gave her the injection in the office she did not have the reaction.  She returns today for an evaluation.   09/21/20: Tammy Garza is a 42 year old female with a history of migraine headaches.  She returns today for follow-up.  She reports that her headaches have improved with Ajovy.  She only has 2-3 headache days a month.  Nurtec usually resolves the headache fairly quickly.  She states a few days before  her next injection she typically will get a headache.  She has noticed recently she has been getting a site reaction with her injections that would last for approximately 1 week.  She states that it is red and itchy.  She joins [place] today for virtual visit.  HISTORY Tammy Garza is a 42 y.o. female here as requested by Alba Cory, MD for migraines.  She has a past medical history of protein C deficiency, MGUS, DVT, vitamin D deficiency, migraine with aura, shiftwork sleep disorder.  I reviewed Dr. Rosalee Kaufman notes: She is to see Dr. Neale Burly but because he missed glioblastoma on her youngest daughter she would like to see someone else.  She is taking gabapentin since 2018, episodes down to about 3 times per month, she has associated nausea, photophobia and phonophobia, pain is described as temporal throbbing-like sometimes nuchal area, she has intermittent numbness on 1 side of her body sometimes severe nausea.   She is here alone and reports she has had them since a teen. Started right after menses. Mother, sister have migraines. She will have some with auras, spotting, she may also go numb in arms, she has photo/phono/osmophobia, change of weather can trigger and worsen her migraines, nausea, vomiting, pulsating/pounding/throbbing, jaws are sight, neck gets tight, they start in the temples unilaterally and then progress to the other side, movement makes them worse, she wants to go in a dark room. She has 19 headache days a month, at lest 9 of them are migraines that can be moderately severe  to severe. She does not always get an aura. She has taken Gabapentin in the past, compazine, baclofen. No other focal neurologic deficits, associated symptoms, inciting events or modifiable factors. Not positional. No changes in quality. Not exertional.    From a review of records, meds tried include: Baclofen, nerve blocks, compazine, topamax, gabapentin, amiytroiptyline, rizatriptan, baclofen, compazine, tylenol,  ibuprofen(can't take now), thorazine, flexeril, decadron, nurtec. Ketorolac inj, medrol dosepak, zofran, paxil, phenergan, effexor, trazodone, zonisa,mide.    Reviewed notes, labs and imaging from outside physicians, which showed: MRI brain 04/18/2018:  FINDINGS: Brain: No acute infarction, hemorrhage, hydrocephalus, extra-axial collection or mass lesion. Complete corpus callosum and vermis. Morphologically normal pituitary gland. No cerebellar tonsillar ectopia. No structural or signal abnormality of brain identified.   Vascular: Normal flow voids.   Skull and upper cervical spine: Normal marrow signal.   Sinuses/Orbits: Negative.   Other: None.   IMPRESSION: No acute abnormality identified.  Unremarkable MRI of the brain.  REVIEW OF SYSTEMS: Out of a complete 14 system review of symptoms, the patient complains only of the following symptoms, and all other reviewed systems are negative.  ALLERGIES: Allergies  Allergen Reactions   Topamax [Topiramate] Other (See Comments)    Confusion , slurred speech    Effexor [Venlafaxine]     Gas and bloating     HOME MEDICATIONS: Outpatient Medications Prior to Visit  Medication Sig Dispense Refill   Boric Acid CRYS Place 600 mg vaginally 2 (two) times a week. 500 g 3   clindamycin (CLEOCIN T) 1 % lotion Apply topically every morning.     FLUoxetine (PROZAC) 20 MG tablet Take by mouth.     Fremanezumab-vfrm (AJOVY) 225 MG/1.5ML SOAJ Inject 225 mg into the skin every 30 (thirty) days. 4.5 mL 2   glycopyrrolate (ROBINUL) 1 MG tablet Take 3 mg by mouth daily.     hydrOXYzine (ATARAX) 10 MG tablet SMARTSIG:1 Pill By Mouth Every Night PRN     NURTEC 75 MG TBDP Dissolve 1 tablet by mouth daily as needed. 30 tablet 1   propranolol (INDERAL) 10 MG tablet Take 5 mg by mouth 3 (three) times daily as needed.     rivaroxaban (XARELTO) 20 MG TABS tablet Take 1 tablet (20 mg total) by mouth daily. 90 tablet 3   traZODone (DESYREL) 100 MG tablet Take  100 mg by mouth at bedtime.     tretinoin (RETIN-A) 0.025 % cream Apply topically.     Vitamin D, Ergocalciferol, (DRISDOL) 1.25 MG (50000 UNIT) CAPS capsule Take 1 capsule (50,000 Units total) by mouth every 7 (seven) days. 12 capsule 1   No facility-administered medications prior to visit.    PAST MEDICAL HISTORY: Past Medical History:  Diagnosis Date   Anemia    h/o   BRCA negative 01/2020   MyRisk neg except AXIN2 and RPS20 VUS   Cervical high risk human papillomavirus (HPV) DNA test positive    Chronic gastritis    Depression    DVT (deep venous thrombosis) (HCC) 09/2016   left leg   Family history of ovarian cancer    GERD (gastroesophageal reflux disease)    H. pylori infection    History of attempted suicide    History of gastric polyp    History of kidney stones    h/o   Hyperhidrosis    Increased risk of breast cancer 01/2020   IBIS=20.5%   Migraines    Protein C deficiency (HCC)    Rash    Shift work  sleep disorder     PAST SURGICAL HISTORY: Past Surgical History:  Procedure Laterality Date   ABDOMINAL HYSTERECTOMY     ANTERIOR CRUCIATE LIGAMENT REPAIR Left    BREAST BIOPSY Left 10/07/2019   left breast calcs x clip   CHOLECYSTECTOMY     CYSTOSCOPY  12/19/2016   Procedure: CYSTOSCOPY;  Surgeon: Nadara Mustard, MD;  Location: [facility];  Service: Gynecology;;   ESOPHAGOGASTRODUODENOSCOPY (EGD) WITH PROPOFOL N/A 09/25/2017   Procedure: ESOPHAGOGASTRODUODENOSCOPY (EGD) WITH PROPOFOL;  Surgeon: Wyline Mood, MD;  Location: [facility];  Service: Gastroenterology;  Laterality: N/A;   LAPAROSCOPIC HYSTERECTOMY Bilateral 12/19/2016   Procedure: HYSTERECTOMY TOTAL LAPAROSCOPIC BILATERAL SALPINGECTOMY;  Surgeon: Nadara Mustard, MD;  Location: [facility];  Service: Gynecology;  Laterality: Bilateral;   OTHER SURGICAL HISTORY     Knee surgery, 8/23 and 3/24   PLANTAR FASCIA SURGERY  06/2019   RECTAL EXAM UNDER ANESTHESIA N/A 02/26/2019   Procedure: RECTAL EXAM  UNDER ANESTHESIA, EXCISION OF ANAL SKIN TAG;  Surgeon: Duanne Guess, MD;  Location: [facility];  Service: General;  Laterality: N/A;   TUBAL LIGATION      FAMILY HISTORY: Family History  Problem Relation Age of Onset   Cancer Mother        multiple myeloma   Hypertension Mother    Obesity Mother    Migraines Mother    Heart failure Father    Cancer Father 51       lung cancer   Deep vein thrombosis Father    Protein C deficiency Father    COPD Father    Congestive Heart Failure Father    Cancer Sister        cervical   HIV Sister    Ovarian cancer Sister 50   Migraines Sister    Diabetes Brother    Hypertension Maternal Grandmother    COPD Paternal Grandmother    Deep vein thrombosis Paternal Grandmother    Asthma Daughter    Protein C deficiency Daughter    Brain cancer Daughter    Cancer Daughter    Migraines Daughter    Protein C deficiency Daughter    Migraines Daughter    Breast cancer Neg Hx     SOCIAL HISTORY: Social History   Socioeconomic History   Marital status: Divorced    Spouse name: Not on file   Number of children: 2   Years of education: 13   Highest education level: Associate degree: occupational, Scientist, product/process development, or vocational program  Occupational History   Occupation: Location manager   Tobacco Use   Smoking status: Former    Packs/day: 0.25    Years: 5.00    Additional pack years: 0.00    Total pack years: 1.25    Types: Cigarettes    Start date: 05/15/2002    Quit date: 05/16/2007    Years since quitting: 15.3   Smokeless tobacco: Never  Vaping Use   Vaping Use: Never used  Substance and Sexual Activity   Alcohol use: Yes    Alcohol/week: 1.0 standard drink of alcohol    Types: 1 Glasses of wine per week    Comment: occ wine none last 24 hrs   Drug use: Not Currently   Sexual activity: Yes    Partners: Male    Birth control/protection: Surgical    Comment: hysterectomy   Other Topics Concern   Not on file  Social History  Narrative   Separated since June 2016 - her youngest daughter 46 yo died  of complications of glioblastoma, has an older daughter that works.     Engaged to [employer]: Low Risk  (02/08/2022)   Overall Financial Resource Strain (CARDIA)    Difficulty of Paying Living Expenses: Not very hard  Food Insecurity: No Food Insecurity (02/08/2022)   Hunger Vital Sign    Worried About Running Out of Food in the Last Year: Never true    Ran Out of Food in the Last Year: Never true  Transportation Needs: No Transportation Needs (09/10/2020)   PRAPARE - Administrator, Civil Service (Medical): No    Lack of Transportation (Non-Medical): No  Physical Activity: Insufficiently Active (02/08/2022)   Exercise Vital Sign    Days of Exercise per Week: 1 day    Minutes of Exercise per Session: 30 min  Stress: Stress Concern Present (02/08/2022)   [employer] of Occupational Health - Occupational Stress Questionnaire    Feeling of Stress : Rather much  Social Connections: Moderately Isolated (02/08/2022)   Social Connection and Isolation Panel [NHANES]    Frequency of Communication with Friends and Family: More than three times a week    Frequency of Social Gatherings with Friends and Family: Once a week    Attends Religious Services: Never    Database administrator or Organizations: Yes    Attends Banker Meetings: 1 to 4 times per year    Marital Status: Divorced  Intimate Partner Violence: Not At Risk (02/08/2022)   Humiliation, Afraid, Rape, and Kick questionnaire    Fear of Current or Ex-Partner: No    Emotionally Abused: No    Physically Abused: No    Sexually Abused: No      PHYSICAL EXAM Generalized: Well developed, in no acute distress   Neurological examination  Mentation: Alert oriented to time, place, history taking. Follows all commands speech and language fluent Cranial nerve II-XII: facial  symmetry noted  DIAGNOSTIC DATA (LABS, IMAGING, TESTING) - I reviewed patient records, labs, notes, testing and imaging myself where available.  Lab Results  Component Value Date   WBC 8.4 12/22/2021   HGB 12.9 12/22/2021   HCT 39.8 12/22/2021   MCV 96.8 12/22/2021   PLT 223 12/22/2021      Component Value Date/Time   NA 138 12/22/2021 0913   NA 141 07/26/2015 1244   NA 141 04/17/2014 0502   K 4.0 12/22/2021 0913   K 4.4 04/17/2014 0502   CL 106 12/22/2021 0913   CL 107 04/17/2014 0502   CO2 25 12/22/2021 0913   CO2 27 04/17/2014 0502   GLUCOSE 87 12/22/2021 0913   GLUCOSE 81 04/17/2014 0502   BUN 11 12/22/2021 0913   BUN 6 07/26/2015 1244   BUN 4 (L) 04/17/2014 0502   CREATININE 0.71 12/22/2021 0913   CREATININE 0.66 09/10/2020 0950   CALCIUM 8.5 (L) 12/22/2021 0913   CALCIUM 7.7 (L) 04/17/2014 0502   PROT 7.0 12/22/2021 0913   PROT 7.2 07/26/2015 1244   PROT 6.4 04/17/2014 0502   ALBUMIN 4.0 12/22/2021 0913   ALBUMIN 4.2 07/26/2015 1244   ALBUMIN 3.1 (L) 04/17/2014 0502   AST 14 (L) 12/22/2021 0913   AST 14 (L) 04/17/2014 0502   ALT 11 12/22/2021 0913   ALT 13 (L) 04/17/2014 0502   ALKPHOS 41 12/22/2021 0913   ALKPHOS 44 (L) 04/17/2014 0502   BILITOT 0.6 12/22/2021 0913   BILITOT 0.6 07/26/2015 1244  BILITOT 0.9 04/17/2014 0502   GFRNONAA >60 12/22/2021 0913   GFRNONAA 110 09/10/2020 0950   GFRAA 127 09/10/2020 0950   Lab Results  Component Value Date   CHOL 229 (H) 02/08/2022   HDL 83 02/08/2022   LDLCALC 126 (H) 02/08/2022   TRIG 101 02/08/2022   CHOLHDL 2.8 02/08/2022   Lab Results  Component Value Date   HGBA1C 4.8 02/08/2022   Lab Results  Component Value Date   VITAMINB12 665 07/30/2018   Lab Results  Component Value Date   TSH 2.04 02/20/2018      ASSESSMENT AND PLAN 42 y.o. year old female  has a past medical history of Anemia, BRCA negative (01/2020), Cervical high risk human papillomavirus (HPV) DNA test positive, Chronic  gastritis, Depression, DVT (deep venous thrombosis) (HCC) (09/2016), Family history of ovarian cancer, GERD (gastroesophageal reflux disease), H. pylori infection, History of attempted suicide, History of gastric polyp, History of kidney stones, Hyperhidrosis, Increased risk of breast cancer (01/2020), Migraines, Protein C deficiency (HCC), Rash, and Shift work sleep disorder. here with :  1.  Migraine headaches  -- For now continue Ajovy monthly injection.  I will have our staff reach out to see if we can do an appeal letter to keep her on Ajovy since it has been working well. --If she is unable to stay on Ajovy she will switch to Emgality injection 120 mg monthly -- Continue Nurtec 75 mg 1 tablet in 24 hours for abortive therapy as needed -Follow-up in 1 year or sooner if needed    Butch Penny, MSN, NP-C 09/22/2022, 11:23 AM [facility] [address] [place], [place] [address] [phone]

## 2022-09-25 ENCOUNTER — Telehealth: Payer: Self-pay | Admitting: *Deleted

## 2022-09-25 ENCOUNTER — Other Ambulatory Visit: Payer: Self-pay | Admitting: *Deleted

## 2022-09-25 ENCOUNTER — Other Ambulatory Visit: Payer: Self-pay | Admitting: Adult Health

## 2022-09-25 ENCOUNTER — Telehealth: Payer: Self-pay

## 2022-09-25 DIAGNOSIS — G43709 Chronic migraine without aura, not intractable, without status migrainosus: Secondary | ICD-10-CM

## 2022-09-25 DIAGNOSIS — G43109 Migraine with aura, not intractable, without status migrainosus: Secondary | ICD-10-CM

## 2022-09-25 MED ORDER — RIVAROXABAN 20 MG PO TABS: 20.0000 mg | ORAL_TABLET | Freq: Every day | ORAL | 3 refills | Status: DC

## 2022-09-25 NOTE — Telephone Encounter (Signed)
CMM Initiated KEY [id number] for ajovy.

## 2022-09-25 NOTE — Telephone Encounter (Signed)
Patient states she currently out of Xarelto and has been out for the last week. She states the pharmacy is waiting on MD authorization.

## 2022-09-25 NOTE — Telephone Encounter (Signed)
-----   Message from Butch Penny, NP sent at 09/25/2022  8:52 AM EDT ----- Can we see if she can continue on Ajovy?  Possible to do an appeal letter?  If so hard no we can switch her to [employer]

## 2022-09-26 ENCOUNTER — Other Ambulatory Visit: Payer: Self-pay

## 2022-09-26 ENCOUNTER — Other Ambulatory Visit: Payer: Self-pay | Admitting: Adult Health

## 2022-09-26 DIAGNOSIS — G43709 Chronic migraine without aura, not intractable, without status migrainosus: Secondary | ICD-10-CM

## 2022-09-26 DIAGNOSIS — G43109 Migraine with aura, not intractable, without status migrainosus: Secondary | ICD-10-CM

## 2022-09-26 MED ORDER — AJOVY 225 MG/1.5ML ~~LOC~~ SOAJ
225.0000 mg | SUBCUTANEOUS | 2 refills | Status: DC
Start: 2022-09-26 — End: 2022-09-26

## 2022-09-28 ENCOUNTER — Ambulatory Visit
Admission: RE | Admit: 2022-09-28 | Discharge: 2022-09-28 | Disposition: A | Discharge status: Home / Self Care | Payer: Commercial Managed Care - HMO | Source: Ambulatory Visit | Attending: Obstetrics and Gynecology | Admitting: Obstetrics and Gynecology

## 2022-09-28 DIAGNOSIS — Z9189 Other specified personal risk factors, not elsewhere classified: Secondary | ICD-10-CM

## 2022-09-28 DIAGNOSIS — Z1231 Encounter for screening mammogram for malignant neoplasm of breast: Secondary | ICD-10-CM | POA: Diagnosis present

## 2022-09-28 DIAGNOSIS — N6311 Unspecified lump in the right breast, upper outer quadrant: Secondary | ICD-10-CM

## 2022-10-23 ENCOUNTER — Other Ambulatory Visit: Payer: Self-pay | Admitting: Family Medicine

## 2022-10-27 ENCOUNTER — Encounter: Payer: Self-pay | Admitting: Oncology

## 2022-10-27 ENCOUNTER — Other Ambulatory Visit: Payer: Self-pay

## 2022-10-27 ENCOUNTER — Inpatient Hospital Stay: Payer: 59

## 2022-10-27 ENCOUNTER — Inpatient Hospital Stay: Payer: 59 | Attending: Oncology | Admitting: Oncology

## 2022-10-27 VITALS — BP 121/73 | HR 67 | Temp 97.1°F | Resp 18 | Ht 66.0 in | Wt 184.9 lb

## 2022-10-27 DIAGNOSIS — Z86718 Personal history of other venous thrombosis and embolism: Secondary | ICD-10-CM | POA: Insufficient documentation

## 2022-10-27 DIAGNOSIS — D6859 Other primary thrombophilia: Secondary | ICD-10-CM | POA: Diagnosis present

## 2022-10-27 DIAGNOSIS — Z7901 Long term (current) use of anticoagulants: Secondary | ICD-10-CM | POA: Diagnosis not present

## 2022-10-27 DIAGNOSIS — D472 Monoclonal gammopathy: Secondary | ICD-10-CM | POA: Insufficient documentation

## 2022-10-27 LAB — BASIC METABOLIC PANEL - CANCER CENTER ONLY
Anion gap: 8 (ref 5–15)
BUN: 11 mg/dL (ref 6–20)
CO2: 23 mmol/L (ref 22–32)
Calcium: 8.6 mg/dL — ABNORMAL LOW (ref 8.9–10.3)
Chloride: 106 mmol/L (ref 98–111)
Creatinine: 0.74 mg/dL (ref 0.44–1.00)
GFR, Estimated: >60 mL/min (ref >60)
Glucose, Bld: 83 mg/dL (ref 70–99)
Potassium: 3.9 mmol/L (ref 3.5–5.1)
Sodium: 137 mmol/L (ref 135–145)

## 2022-10-27 LAB — CBC WITH DIFFERENTIAL (CANCER CENTER ONLY)
Abs Immature Granulocytes: 0.01 10*3/uL (ref 0.00–0.07)
Basophils Absolute: 0.1 10*3/uL (ref 0.0–0.1)
Basophils Relative: 1 %
Eosinophils Absolute: 0.2 10*3/uL (ref 0.0–0.5)
Eosinophils Relative: 4 %
HCT: 39.2 % (ref 36.0–46.0)
Hemoglobin: 12.7 g/dL (ref 12.0–15.0)
Immature Granulocytes: 0 %
Lymphocytes Relative: 46 %
Lymphs Abs: 2.8 10*3/uL (ref 0.7–4.0)
MCH: 30.7 pg (ref 26.0–34.0)
MCHC: 32.4 g/dL (ref 30.0–36.0)
MCV: 94.7 fL (ref 80.0–100.0)
Monocytes Absolute: 0.6 10*3/uL (ref 0.1–1.0)
Monocytes Relative: 10 %
Neutro Abs: 2.4 10*3/uL (ref 1.7–7.7)
Neutrophils Relative %: 39 %
Platelet Count: 234 10*3/uL (ref 150–400)
RBC: 4.14 MIL/uL (ref 3.87–5.11)
RDW: 12.1 % (ref 11.5–15.5)
WBC Count: 6.1 10*3/uL (ref 4.0–10.5)
nRBC: 0 % (ref 0.0–0.2)

## 2022-10-27 NOTE — Progress Notes (Signed)
Hematology/Oncology Consult note [facility]  [phone] [phone]  Patient Care Team: Alba Cory, MD as PCP - General (Family Medicine) Dasher, Cliffton Asters, MD (Dermatology) Santiago Glad, MD as Referring Physician (Specialist) Wyline Mood, MD as Consulting Physician (Gastroenterology) Mitchell Heir, MD as Referring Physician (Psychiatry) Earna Coder, MD as Consulting Physician (Internal Medicine)   Name of the patient: Tammy Garza  [id number]  24-Nov-1980   Date of visit: 10/27/22  Diagnosis-history of left lower extremity DVT and protein C deficiency currently on Xarelto  Chief complaint/ Reason for visit-routine follow-up of DVT on Xarelto  Heme/Onc history: Patient is a 42 year old African-American femaleerican female diagnosed with left lower extremity DVT in May 2018 following a 2-1/2-hour flight.  Prior to that she was on Depo-Provera for a year.  Left lower extremity Doppler at that time showed acute appearing thrombus in the calf veins involving the posterior tibial and peroneal veins.  Patient was on Eliquis since 2018 but was switched to Xarelto starting January 2022 due to insurance issues.  Family history of DVT in her father and her daughter who subsequently died of a glioblastoma Hypercoagulable work-up on 09/26/2016 and 11/30/2016 revealed the following negative studies:  Factor V Leiden, prothrombin gene mutation, lupus anticoagulant panel, anticardiolipin antibodies, beta2-glycoprotein, protein S antigen/activity, and ATIII antigen/activity.  She has protein C deficiency.  Labs on 11/30/2016 revealed a protein C total of 40% (60 - 150%) and protein C activity of 49% (73-180%).  Repeat testing on 03/29/2017 revealed a protein C total of 51% and a protein C activity of 51%.   She has a history of MGUS when IgA monoclonal protein was found on immunofixation on SPEP back in May 2018 but was not subsequently detected on  myeloma panel on 2 separate occasions.  Interval history-patient is tolerating Xarelto well without any significant bleeding issues.  She has not had any recent thrombotic events.  ECOG PS- 0 Pain scale- 0   Review of systems- Review of Systems  Constitutional:  Negative for chills, fever, malaise/fatigue and weight loss.  HENT:  Negative for congestion, ear discharge and nosebleeds.   Eyes:  Negative for blurred vision.  Respiratory:  Negative for cough, hemoptysis, sputum production, shortness of breath and wheezing.   Cardiovascular:  Negative for chest pain, palpitations, orthopnea and claudication.  Gastrointestinal:  Negative for abdominal pain, blood in stool, constipation, diarrhea, heartburn, melena, nausea and vomiting.  Genitourinary:  Negative for dysuria, flank pain, frequency, hematuria and urgency.  Musculoskeletal:  Negative for back pain, joint pain and myalgias.  Skin:  Negative for rash.  Neurological:  Negative for dizziness, tingling, focal weakness, seizures, weakness and headaches.  Endo/Heme/Allergies:  Does not bruise/bleed easily.  Psychiatric/Behavioral:  Negative for depression and suicidal ideas. The patient does not have insomnia.       Allergies  Allergen Reactions   Topamax [Topiramate] Other (See Comments)    Confusion , slurred speech    Effexor [Venlafaxine]     Gas and bloating      Past Medical History:  Diagnosis Date   Anemia    h/o   BRCA negative 01/2020   MyRisk neg except AXIN2 and RPS20 VUS   Cervical high risk human papillomavirus (HPV) DNA test positive    Chronic gastritis    Depression    DVT (deep venous thrombosis) (HCC) 09/2016   left leg   Family history of ovarian cancer    GERD (gastroesophageal reflux disease)  H. pylori infection    History of attempted suicide    History of gastric polyp    History of kidney stones    h/o   Hyperhidrosis    Increased risk of breast cancer 01/2020   IBIS=20.5%   Migraines     Protein C deficiency (HCC)    Rash    Shift work sleep disorder      Past Surgical History:  Procedure Laterality Date   ABDOMINAL HYSTERECTOMY     ANTERIOR CRUCIATE LIGAMENT REPAIR Left    BREAST BIOPSY Left 10/07/2019   left breast calcs x clip   CHOLECYSTECTOMY     CYSTOSCOPY  12/19/2016   Procedure: CYSTOSCOPY;  Surgeon: Nadara Mustard, MD;  Location: [facility] ORS;  Service: Gynecology;;   ESOPHAGOGASTRODUODENOSCOPY (EGD) WITH PROPOFOL N/A 09/25/2017   Procedure: ESOPHAGOGASTRODUODENOSCOPY (EGD) WITH PROPOFOL;  Surgeon: Wyline Mood, MD;  Location: [facility] ENDOSCOPY;  Service: Gastroenterology;  Laterality: N/A;   LAPAROSCOPIC HYSTERECTOMY Bilateral 12/19/2016   Procedure: HYSTERECTOMY TOTAL LAPAROSCOPIC BILATERAL SALPINGECTOMY;  Surgeon: Nadara Mustard, MD;  Location: [facility] ORS;  Service: Gynecology;  Laterality: Bilateral;   OTHER SURGICAL HISTORY     Knee surgery, 8/23 and 3/24   PLANTAR FASCIA SURGERY  06/2019   RECTAL EXAM UNDER ANESTHESIA N/A 02/26/2019   Procedure: RECTAL EXAM UNDER ANESTHESIA, EXCISION OF ANAL SKIN TAG;  Surgeon: Duanne Guess, MD;  Location: [facility] ORS;  Service: General;  Laterality: N/A;   TUBAL LIGATION      Social History   Socioeconomic History   Marital status: Divorced    Spouse name: Not on file   Number of children: 2   Years of education: 13   Highest education level: Associate degree: occupational, Scientist, product/process development, or vocational program  Occupational History   Occupation: Location manager   Tobacco Use   Smoking status: Former    Packs/day: 0.25    Years: 5.00    Additional pack years: 0.00    Total pack years: 1.25    Types: Cigarettes    Start date: 05/15/2002    Quit date: 05/16/2007    Years since quitting: 15.4   Smokeless tobacco: Never  Vaping Use   Vaping Use: Never used  Substance and Sexual Activity   Alcohol use: Yes    Alcohol/week: 1.0 standard drink of alcohol    Types: 1 Glasses of wine per week    Comment: occ wine  none last 24 hrs   Drug use: Not Currently   Sexual activity: Yes    Partners: Male    Birth control/protection: Surgical    Comment: hysterectomy   Other Topics Concern   Not on file  Social History Narrative   Separated since June 2016 - her youngest daughter 4 yo died of complications of glioblastoma, has an older daughter that works.     Engaged to [employer]: Low Risk  (02/08/2022)   Overall Financial Resource Strain (CARDIA)    Difficulty of Paying Living Expenses: Not very hard  Food Insecurity: No Food Insecurity (02/08/2022)   Hunger Vital Sign    Worried About Running Out of Food in the Last Year: Never true    Ran Out of Food in the Last Year: Never true  Transportation Needs: No Transportation Needs (09/10/2020)   PRAPARE - Administrator, Civil Service (Medical): No    Lack of Transportation (Non-Medical): No  Physical Activity: Insufficiently Active (02/08/2022)   Exercise  Vital Sign    Days of Exercise per Week: 1 day    Minutes of Exercise per Session: 30 min  Stress: Stress Concern Present (02/08/2022)   [employer] of Occupational Health - Occupational Stress Questionnaire    Feeling of Stress : Rather much  Social Connections: Moderately Isolated (02/08/2022)   Social Connection and Isolation Panel [NHANES]    Frequency of Communication with Friends and Family: More than three times a week    Frequency of Social Gatherings with Friends and Family: Once a week    Attends Religious Services: Never    Database administrator or Organizations: Yes    Attends Banker Meetings: 1 to 4 times per year    Marital Status: Divorced  Catering manager Violence: Not At Risk (02/08/2022)   Humiliation, Afraid, Rape, and Kick questionnaire    Fear of Current or Ex-Partner: No    Emotionally Abused: No    Physically Abused: No    Sexually Abused: No    Family History  Problem Relation  Age of Onset   Cancer Mother        multiple myeloma   Hypertension Mother    Obesity Mother    Migraines Mother    Heart failure Father    Cancer Father 16       lung cancer   Deep vein thrombosis Father    Protein C deficiency Father    COPD Father    Congestive Heart Failure Father    Cancer Sister        cervical   HIV Sister    Ovarian cancer Sister 64   Migraines Sister    Diabetes Brother    Hypertension Maternal Grandmother    COPD Paternal Grandmother    Deep vein thrombosis Paternal Grandmother    Asthma Daughter    Protein C deficiency Daughter    Brain cancer Daughter    Cancer Daughter    Migraines Daughter    Protein C deficiency Daughter    Migraines Daughter    Breast cancer Neg Hx      Current Outpatient Medications:    Boric Acid CRYS, Place 600 mg vaginally 2 (two) times a week., Disp: 500 g, Rfl: 3   clindamycin (CLEOCIN T) 1 % lotion, Apply topically every morning., Disp: , Rfl:    FLUoxetine (PROZAC) 20 MG tablet, Take by mouth., Disp: , Rfl:    Fremanezumab-vfrm (AJOVY) 225 MG/1.5ML SOAJ, INJECT 225 MG INTO THE SKIN EVERY 30 (THIRTY) DAYS., Disp: 4.5 mL, Rfl: 2   glycopyrrolate (ROBINUL) 1 MG tablet, Take 3 mg by mouth daily., Disp: , Rfl:    hydrOXYzine (ATARAX) 10 MG tablet, SMARTSIG:1 Pill By Mouth Every Night PRN, Disp: , Rfl:    NURTEC 75 MG TBDP, Dissolve 1 tablet by mouth daily as needed., Disp: 30 tablet, Rfl: 1   propranolol (INDERAL) 10 MG tablet, Take 5 mg by mouth 3 (three) times daily as needed., Disp: , Rfl:    rivaroxaban (XARELTO) 20 MG TABS tablet, Take 1 tablet (20 mg total) by mouth daily., Disp: 90 tablet, Rfl: 3   traZODone (DESYREL) 100 MG tablet, Take 100 mg by mouth at bedtime., Disp: , Rfl:    tretinoin (RETIN-A) 0.025 % cream, Apply topically., Disp: , Rfl:    Vitamin D, Ergocalciferol, (DRISDOL) 1.25 MG (50000 UNIT) CAPS capsule, Take 1 capsule (50,000 Units total) by mouth every 7 (seven) days., Disp: 12 capsule, Rfl:  1  Physical exam:  Vitals:  10/27/22 1028  BP: 121/73  Pulse: 67  Resp: 18  Temp: (!) 97.1 F (36.2 C)  TempSrc: Tympanic  SpO2: 100%  Weight: 184 lb 14.4 oz (83.9 kg)  Height: 5\' 6"  (1.676 m)   Physical Exam Cardiovascular:     Rate and Rhythm: Normal rate and regular rhythm.     Heart sounds: Normal heart sounds.  Pulmonary:     Effort: Pulmonary effort is normal.     Breath sounds: Normal breath sounds.  Abdominal:     General: Bowel sounds are normal.     Palpations: Abdomen is soft.  Skin:    General: Skin is warm and dry.  Neurological:     Mental Status: She is alert and oriented to person, place, and time.         Latest Ref Rng & Units 10/27/2022   10:15 AM  CMP  Glucose 70 - 99 mg/dL 83   BUN 6 - 20 mg/dL 11   Creatinine 1.61 - 1.00 mg/dL 0.96   Sodium 045 - 409 mmol/L 137   Potassium 3.5 - 5.1 mmol/L 3.9   Chloride 98 - 111 mmol/L 106   CO2 22 - 32 mmol/L 23   Calcium 8.9 - 10.3 mg/dL 8.6       Latest Ref Rng & Units 10/27/2022   10:15 AM  CBC  WBC 4.0 - 10.5 K/uL 6.1   Hemoglobin 12.0 - 15.0 g/dL 81.1   Hematocrit 91.4 - 46.0 % 39.2   Platelets 150 - 400 K/uL 234     No images are attached to the encounter.  MM 3D DIAGNOSTIC MAMMOGRAM BILATERAL BREAST  Result Date: 09/28/2022 CLINICAL DATA:  Palpable lump in the right breast. EXAM: DIGITAL DIAGNOSTIC BILATERAL MAMMOGRAM WITH TOMOSYNTHESIS; ULTRASOUND RIGHT BREAST LIMITED; ULTRASOUND LEFT BREAST LIMITED TECHNIQUE: Bilateral digital diagnostic mammography and breast tomosynthesis was performed.; Targeted ultrasound examination of the right breast was performed; Targeted ultrasound examination of the left breast was performed. COMPARISON:  Previous exam(s). ACR Breast Density Category c: The breasts are heterogeneously dense, which may obscure small masses. FINDINGS: No suspicious findings in the right breast. There is a mass in the medial left breast which persists on additional imaging. The mass is  oval, circumscribed, and low in density. No other suspicious findings in the left breast. Targeted ultrasound is performed, showing fibrocystic changes bilaterally correlating with the palpable lump on the right and the mass medially on the left. IMPRESSION: Fibrocystic changes.  No evidence of malignancy. RECOMMENDATION: Annual screening mammography. I have discussed the findings and recommendations with the patient. If applicable, a reminder letter will be sent to the patient regarding the next appointment. BI-RADS CATEGORY  2: Benign. Electronically Signed   By: Gerome Sam III M.D.   On: 09/28/2022 12:02  [place] LIMITED ULTRASOUND INCLUDING AXILLA LEFT BREAST   Result Date: 09/28/2022 CLINICAL DATA:  Palpable lump in the right breast. EXAM: DIGITAL DIAGNOSTIC BILATERAL MAMMOGRAM WITH TOMOSYNTHESIS; ULTRASOUND RIGHT BREAST LIMITED; ULTRASOUND LEFT BREAST LIMITED TECHNIQUE: Bilateral digital diagnostic mammography and breast tomosynthesis was performed.; Targeted ultrasound examination of the right breast was performed; Targeted ultrasound examination of the left breast was performed. COMPARISON:  Previous exam(s). ACR Breast Density Category c: The breasts are heterogeneously dense, which may obscure small masses. FINDINGS: No suspicious findings in the right breast. There is a mass in the medial left breast which persists on additional imaging. The mass is oval, circumscribed, and low in density. No other suspicious findings in the left breast.  Targeted ultrasound is performed, showing fibrocystic changes bilaterally correlating with the palpable lump on the right and the mass medially on the left. IMPRESSION: Fibrocystic changes.  No evidence of malignancy. RECOMMENDATION: Annual screening mammography. I have discussed the findings and recommendations with the patient. If applicable, a reminder letter will be sent to the patient regarding the next appointment. BI-RADS CATEGORY  2: Benign. Electronically  Signed   By: Gerome Sam III M.D.   On: 09/28/2022 12:02  [place] LIMITED ULTRASOUND INCLUDING AXILLA RIGHT BREAST  Result Date: 09/28/2022 CLINICAL DATA:  Palpable lump in the right breast. EXAM: DIGITAL DIAGNOSTIC BILATERAL MAMMOGRAM WITH TOMOSYNTHESIS; ULTRASOUND RIGHT BREAST LIMITED; ULTRASOUND LEFT BREAST LIMITED TECHNIQUE: Bilateral digital diagnostic mammography and breast tomosynthesis was performed.; Targeted ultrasound examination of the right breast was performed; Targeted ultrasound examination of the left breast was performed. COMPARISON:  Previous exam(s). ACR Breast Density Category c: The breasts are heterogeneously dense, which may obscure small masses. FINDINGS: No suspicious findings in the right breast. There is a mass in the medial left breast which persists on additional imaging. The mass is oval, circumscribed, and low in density. No other suspicious findings in the left breast. Targeted ultrasound is performed, showing fibrocystic changes bilaterally correlating with the palpable lump on the right and the mass medially on the left. IMPRESSION: Fibrocystic changes.  No evidence of malignancy. RECOMMENDATION: Annual screening mammography. I have discussed the findings and recommendations with the patient. If applicable, a reminder letter will be sent to the patient regarding the next appointment. BI-RADS CATEGORY  2: Benign. Electronically Signed   By: Gerome Sam III M.D.   On: 09/28/2022 12:02    Assessment and plan- Patient is a 42 y.o. female with history of left lower extremity DVT in 2018 secondary to protein C deficiency here for routine follow-up  Patient has remained on lifelong anticoagulation with Xarelto as she was found to have protein C deficiency for workup of her left lower extremity DVT in 2018.  She is tolerating Xarelto well without any significant side effects.  No bleeding issues and no recurrent thrombotic events.  I will continue to see her on a yearly  basis. Patient was found to have a small amount of M protein in 2018 but was not subsequently detected on 2 separate occasions and therefore does not require any further follow-up    Visit Diagnosis 1. Protein C deficiency (HCC)   2. History of DVT (deep vein thrombosis)   3. Monoclonal gammopathy of unknown significance (MGUS)   4. Current use of long term anticoagulation      Dr. Owens Shark, MD, MPH [facility] at [facility] [phone] 10/27/2022 1:51 PM

## 2022-10-31 LAB — PROTEIN ELECTROPHORESIS, SERUM
A/G Ratio: 1.4 (ref 0.7–1.7)
Albumin ELP: 3.9 g/dL (ref 2.9–4.4)
Alpha-1-Globulin: 0.2 g/dL (ref 0.0–0.4)
Alpha-2-Globulin: 0.6 g/dL (ref 0.4–1.0)
Beta Globulin: 0.8 g/dL (ref 0.7–1.3)
Gamma Globulin: 1.1 g/dL (ref 0.4–1.8)
Globulin, Total: 2.8 g/dL (ref 2.2–3.9)
Total Protein ELP: 6.7 g/dL (ref 6.0–8.5)

## 2022-11-03 ENCOUNTER — Other Ambulatory Visit: Payer: 59

## 2022-11-03 ENCOUNTER — Ambulatory Visit: Payer: 59 | Admitting: Oncology

## 2023-02-13 ENCOUNTER — Encounter: Payer: 59 | Admitting: Family Medicine

## 2023-02-16 NOTE — Progress Notes (Unsigned)
Name: Tammy Garza   MRN: [id number]    DOB: [birth year]   Date:02/19/2023       Progress Note  Subjective  Chief Complaint  Annual Exam  HPI  Patient presents for annual CPE.  Diet: eats 2-3 times per day, cooks at home, eats out seldom Exercise:  discussed importance of increasing to 30 minutes at least 5 days a week  Last Eye Exam: scheduled for this upcoming weekend  Last Dental Exam: she is going to schedule it   [employer] Visit from 08/12/2021 in [facility]  AUDIT-C Score 1      Depression: Phq 9 is  positive    02/19/2023    2:05 PM 04/12/2022   10:25 AM 02/08/2022   10:14 AM 08/12/2021    3:58 PM 02/11/2021   10:35 AM  Depression screen PHQ 2/9  Decreased Interest 2 2 2 1 2   Down, Depressed, Hopeless 2 1 0 1 1  PHQ - 2 Score 4 3 2 2 3   Altered sleeping 0 2 3 3 3   Tired, decreased energy 0 3 1 2 3   Change in appetite 0 1 1 0 0  Feeling bad or failure about yourself  0 0 0 0 0  Trouble concentrating 0 1 3 2 3   Moving slowly or fidgety/restless 0 0 0 0 0  Suicidal thoughts 0 0 0 0 0  PHQ-9 Score 4 10 10 9 12   Difficult doing work/chores  Very difficult Somewhat difficult Somewhat difficult    Hypertension: BP Readings from Last 3 Encounters:  02/19/23 126/80  10/27/22 121/73  09/12/22 (!) 120/90   Obesity: Wt Readings from Last 3 Encounters:  02/19/23 176 lb (79.8 kg)  10/27/22 184 lb 14.4 oz (83.9 kg)  09/12/22 182 lb (82.6 kg)   BMI Readings from Last 3 Encounters:  02/19/23 28.41 kg/m  10/27/22 29.84 kg/m  09/12/22 29.38 kg/m     Vaccines:   HPV: N/A Tdap: up to date Shingrix: N/A Pneumonia: N/A Flu: denied  COVID-19: up to date   Hep C Screening: 02/08/22 STD testing and prevention (HIV/chl/gon/syphilis): 02/08/22 Intimate partner violence: negative screen  Sexual History : dating , one sexual partner, pap smear up to date - sees GYN Menstrual History/LMP/Abnormal Bleeding: s/p hysterectomy  supra-cervical  Discussed importance of follow up if any post-menopausal bleeding: N/A Incontinence Symptoms: she has urgency   Breast cancer:  - Last Mammogram: 09/28/22 - BRCA gene screening: N/A  Osteoporosis Prevention : Discussed high calcium and vitamin D supplementation, weight bearing exercises Bone density: N/A   Cervical cancer screening: up to date   Skin cancer: Discussed monitoring for atypical lesions  Colorectal cancer: N/A   Lung cancer:  Low Dose CT Chest recommended if Age 20-80 years, 20 pack-year currently smoking OR have quit w/in 15years. Patient does not qualify for screen   ECG: 07/13/17  Advanced Care Planning: A voluntary discussion about advance care planning including the explanation and discussion of advance directives.  Discussed health care proxy and Living will, and the patient was able to identify a health care proxy as daughter .  Patient does not have a living will and power of attorney of health care   Lipids: Lab Results  Component Value Date   CHOL 229 (H) 02/08/2022   CHOL 186 09/10/2020   CHOL 184 09/10/2019   Lab Results  Component Value Date   HDL 83 02/08/2022   HDL 67 09/10/2020   HDL 57 09/10/2019  Lab Results  Component Value Date   LDLCALC 126 (H) 02/08/2022   LDLCALC 98 09/10/2020   LDLCALC 104 (H) 09/10/2019   Lab Results  Component Value Date   TRIG 101 02/08/2022   TRIG 109 09/10/2020   TRIG 124 09/10/2019   Lab Results  Component Value Date   CHOLHDL 2.8 02/08/2022   CHOLHDL 2.8 09/10/2020   CHOLHDL 3.2 09/10/2019   No results found for: "LDLDIRECT"  Glucose: Glucose  Date Value Ref Range Status  04/17/2014 81 65 - 99 mg/dL Final  40/98/1191 478 (H) 65 - 99 mg/dL Final  29/56/2130 77 65 - 99 mg/dL Final   Glucose, Bld  Date Value Ref Range Status  10/27/2022 83 70 - 99 mg/dL Final    Comment:    Glucose reference range applies only to samples taken after fasting for at least 8 hours.  12/22/2021 87 70 -  99 mg/dL Final    Comment:    Glucose reference range applies only to samples taken after fasting for at least 8 hours.  11/02/2021 77 70 - 99 mg/dL Final    Comment:    Glucose reference range applies only to samples taken after fasting for at least 8 hours.    Patient Active Problem List   Diagnosis Date Noted   Increased risk of breast cancer 09/12/2022   Right Achilles tendinitis 04/12/2022   GAD (generalized anxiety disorder) 04/12/2022   Chronic migraine without aura without status migrainosus, not intractable 05/09/2020   Family history of ovarian cancer 03/23/2020   Pelvic mass in female 03/23/2020   Abnormal mammogram 10/15/2019   History of hemorrhoidectomy    Hypocalcemia 10/13/2018   Protein C deficiency (HCC) 12/10/2016   Monoclonal gammopathy of unknown significance (MGUS) 11/30/2016   History of DVT (deep vein thrombosis) 11/02/2016   Elevated blood protein 09/26/2016   Chronic deep vein thrombosis (DVT) of distal vein of left lower extremity (HCC) 09/17/2016   Vitamin D deficiency 07/26/2015   Thyroid cyst 07/26/2015   History of Helicobacter pylori infection 04/22/2015   Migraine with aura and without status migrainosus 04/22/2015   Shift work sleep disorder 04/22/2015    Past Surgical History:  Procedure Laterality Date   ABDOMINAL HYSTERECTOMY     ANTERIOR CRUCIATE LIGAMENT REPAIR Left    BREAST BIOPSY Left 10/07/2019   left breast calcs x clip   CHOLECYSTECTOMY     CYSTOSCOPY  12/19/2016   Procedure: CYSTOSCOPY;  Surgeon: Nadara Mustard, MD;  Location: [facility] ORS;  Service: Gynecology;;   ESOPHAGOGASTRODUODENOSCOPY (EGD) WITH PROPOFOL N/A 09/25/2017   Procedure: ESOPHAGOGASTRODUODENOSCOPY (EGD) WITH PROPOFOL;  Surgeon: Wyline Mood, MD;  Location: [facility];  Service: Gastroenterology;  Laterality: N/A;   LAPAROSCOPIC HYSTERECTOMY Bilateral 12/19/2016   Procedure: HYSTERECTOMY TOTAL LAPAROSCOPIC BILATERAL SALPINGECTOMY;  Surgeon: Nadara Mustard,  MD;  Location: [facility] ORS;  Service: Gynecology;  Laterality: Bilateral;   OTHER SURGICAL HISTORY     Knee surgery, 8/23 and 3/24   PLANTAR FASCIA SURGERY  06/2019   RECTAL EXAM UNDER ANESTHESIA N/A 02/26/2019   Procedure: RECTAL EXAM UNDER ANESTHESIA, EXCISION OF ANAL SKIN TAG;  Surgeon: Duanne Guess, MD;  Location: [facility] ORS;  Service: General;  Laterality: N/A;   TUBAL LIGATION      Family History  Problem Relation Age of Onset   Cancer Mother        multiple myeloma   Hypertension Mother    Obesity Mother    Migraines Mother    Heart failure Father  Cancer Father 96       lung cancer   Deep vein thrombosis Father    Protein C deficiency Father    COPD Father    Congestive Heart Failure Father    Cancer Sister        cervical   HIV Sister    Ovarian cancer Sister 74   Migraines Sister    Diabetes Brother    Hypertension Maternal Grandmother    COPD Paternal Grandmother    Deep vein thrombosis Paternal Grandmother    Asthma Daughter    Protein C deficiency Daughter    Brain cancer Daughter    Cancer Daughter    Migraines Daughter    Protein C deficiency Daughter    Migraines Daughter    Breast cancer Neg Hx     Social History   Socioeconomic History   Marital status: Divorced    Spouse name: Not on file   Number of children: 2   Years of education: 13   Highest education level: Associate degree: occupational, Scientist, product/process development, or vocational program  Occupational History   Occupation: Location manager   Tobacco Use   Smoking status: Former    Current packs/day: 0.00    Average packs/day: 0.3 packs/day for 5.0 years (1.3 ttl pk-yrs)    Types: Cigarettes    Start date: 05/15/2002    Quit date: 05/16/2007    Years since quitting: 15.7   Smokeless tobacco: Never  Vaping Use   Vaping status: Never Used  Substance and Sexual Activity   Alcohol use: Yes    Alcohol/week: 1.0 standard drink of alcohol    Types: 1 Glasses of wine per week    Comment: occ wine none  last 24 hrs   Drug use: Not Currently   Sexual activity: Yes    Partners: Male    Birth control/protection: Surgical    Comment: hysterectomy   Other Topics Concern   Not on file  Social History Narrative   Separated since June 2016 - her youngest daughter 61 yo died of complications of glioblastoma, has an older daughter that works.     Engaged to [employer]: Low Risk  (02/19/2023)   Overall Financial Resource Strain (CARDIA)    Difficulty of Paying Living Expenses: Not hard at all  Food Insecurity: No Food Insecurity (02/19/2023)   Hunger Vital Sign    Worried About Running Out of Food in the Last Year: Never true    Ran Out of Food in the Last Year: Never true  Transportation Needs: No Transportation Needs (02/19/2023)   PRAPARE - Administrator, Civil Service (Medical): No    Lack of Transportation (Non-Medical): No  Physical Activity: Insufficiently Active (02/19/2023)   Exercise Vital Sign    Days of Exercise per Week: 1 day    Minutes of Exercise per Session: 30 min  Stress: Stress Concern Present (02/19/2023)   [employer] of Occupational Health - Occupational Stress Questionnaire    Feeling of Stress : To some extent  Social Connections: Socially Isolated (02/19/2023)   Social Connection and Isolation Panel [NHANES]    Frequency of Communication with Friends and Family: More than three times a week    Frequency of Social Gatherings with Friends and Family: Once a week    Attends Religious Services: Never    Database administrator or Organizations: No    Attends Banker Meetings: Never  Marital Status: Never married  Intimate Partner Violence: Not At Risk (02/19/2023)   Humiliation, Afraid, Rape, and Kick questionnaire    Fear of Current or Ex-Partner: No    Emotionally Abused: No    Physically Abused: No    Sexually Abused: No     Current Outpatient Medications:    Boric Acid  CRYS, Place 600 mg vaginally 2 (two) times a week., Disp: 500 g, Rfl: 3   clindamycin (CLEOCIN T) 1 % lotion, Apply topically every morning., Disp: , Rfl:    FLUoxetine (PROZAC) 20 MG tablet, Take by mouth., Disp: , Rfl:    Fremanezumab-vfrm (AJOVY) 225 MG/1.5ML SOAJ, INJECT 225 MG INTO THE SKIN EVERY 30 (THIRTY) DAYS., Disp: 4.5 mL, Rfl: 2   glycopyrrolate (ROBINUL) 1 MG tablet, Take 3 mg by mouth daily., Disp: , Rfl:    hydrOXYzine (ATARAX) 10 MG tablet, SMARTSIG:1 Pill By Mouth Every Night PRN, Disp: , Rfl:    NURTEC 75 MG TBDP, Dissolve 1 tablet by mouth daily as needed., Disp: 30 tablet, Rfl: 1   propranolol (INDERAL) 10 MG tablet, Take 5 mg by mouth 3 (three) times daily as needed., Disp: , Rfl:    rivaroxaban (XARELTO) 20 MG TABS tablet, Take 1 tablet (20 mg total) by mouth daily., Disp: 90 tablet, Rfl: 3   traZODone (DESYREL) 100 MG tablet, Take 100 mg by mouth at bedtime., Disp: , Rfl:    tretinoin (RETIN-A) 0.025 % cream, Apply topically., Disp: , Rfl:    Vitamin D, Ergocalciferol, (DRISDOL) 1.25 MG (50000 UNIT) CAPS capsule, Take 1 capsule (50,000 Units total) by mouth every 7 (seven) days., Disp: 12 capsule, Rfl: 1  Allergies  Allergen Reactions   Topamax [Topiramate] Other (See Comments)    Confusion , slurred speech    Effexor [Venlafaxine]     Gas and bloating      ROS  Constitutional: Negative for fever , positive for mild weight change. She has noticed increase in hot flashes and watch showed increase in heart rate over the past few weeks Respiratory: Negative for cough and shortness of breath.   Cardiovascular: Negative for chest pain or palpitations.  Gastrointestinal: Negative for abdominal pain, no bowel changes.  Musculoskeletal: Negative for gait problem or joint swelling.  Skin: Negative for rash.  Neurological: Negative for dizziness or headache.  No other specific complaints in a complete review of systems (except as listed in HPI above).    Objective  Vitals:   02/19/23 1406  BP: 126/80  Pulse: 90  Resp: 16  SpO2: 97%  Weight: 176 lb (79.8 kg)  Height: 5\' 6"  (1.676 m)    Body mass index is 28.41 kg/m.  Physical Exam  Constitutional: Patient appears well-developed and well-nourished. No distress.  HENT: Head: Normocephalic and atraumatic. Ears: B TMs ok, no erythema or effusion; Nose: Nose normal. Mouth/Throat: Oropharynx is clear and moist. No oropharyngeal exudate.  Eyes: Conjunctivae and EOM are normal. Pupils are equal, round, and reactive to light. No scleral icterus.  Neck: Normal range of motion. Neck supple. No JVD present. No thyromegaly present.  Cardiovascular: Normal rate, regular rhythm and normal heart sounds.  No murmur heard. No BLE edema. Pulmonary/Chest: Effort normal and breath sounds normal. No respiratory distress. Abdominal: Soft. Bowel sounds are normal, no distension. There is no tenderness. no masses Breast: no lumps or masses, no nipple discharge or rashes FEMALE GENITALIA:  Not done  RECTAL: not done  Musculoskeletal: Normal range of motion, no joint effusions. No gross  deformities Neurological: he is alert and oriented to person, place, and time. No cranial nerve deficit. Coordination, balance, strength, speech and gait are normal.  Skin: Skin is warm and dry. No rash noted. No erythema.  Psychiatric: Patient has a normal mood and affect. behavior is normal. Judgment and thought content normal.   Fall Risk:    02/19/2023    2:05 PM 04/12/2022   10:24 AM 02/08/2022   10:13 AM 08/12/2021    3:57 PM 02/11/2021   10:35 AM  Fall Risk   Falls in the past year? 0 1 1 1  0  Number falls in past yr: 0 0 0 0 0  Injury with Fall? 0 1 1 1  0  Risk for fall due to : No Fall Risks No Fall Risks;Impaired balance/gait  History of fall(s) No Fall Risks  Follow up Falls prevention discussed Falls prevention discussed;Education provided;Falls evaluation completed  Falls prevention discussed;Falls  evaluation completed;Education provided Falls prevention discussed     Functional Status Survey: Is the patient deaf or have difficulty hearing?: Yes Does the patient have difficulty seeing, even when wearing glasses/contacts?: No Does the patient have difficulty concentrating, remembering, or making decisions?: Yes Does the patient have difficulty walking or climbing stairs?: No Does the patient have difficulty dressing or bathing?: No Does the patient have difficulty doing errands alone such as visiting a doctor's office or shopping?: No   Assessment & Plan   1. Well adult exam  - CBC with Differential/Platelet - COMPLETE METABOLIC PANEL WITH GFR - TSH - Lipid panel  2. Hot flashes  - CBC with Differential/Platelet - COMPLETE METABOLIC PANEL WITH GFR - TSH  3. Dyslipidemia  - Lipid panel  4. Tachycardia  - CBC with Differential/Platelet - TSH  5. Screening examination for STI  - RPR - HIV Antibody (routine testing w rflx)   -USPSTF grade A and B recommendations reviewed with patient; age-appropriate recommendations, preventive care, screening tests, etc discussed and encouraged; healthy living encouraged; see AVS for patient education given to patient -Discussed importance of 150 minutes of physical activity weekly, eat two servings of fish weekly, eat one serving of tree nuts ( cashews, pistachios, pecans, almonds.Marland Kitchen) every other day, eat 6 servings of fruit/vegetables daily and drink plenty of water and avoid sweet beverages.   -Reviewed Health Maintenance: Yes.

## 2023-02-19 ENCOUNTER — Ambulatory Visit (INDEPENDENT_AMBULATORY_CARE_PROVIDER_SITE_OTHER): Payer: 59 | Admitting: Family Medicine

## 2023-02-19 ENCOUNTER — Encounter: Payer: Self-pay | Admitting: Family Medicine

## 2023-02-19 VITALS — BP 126/80 | HR 90 | Resp 16 | Ht 66.0 in | Wt 176.0 lb

## 2023-02-19 DIAGNOSIS — R232 Flushing: Secondary | ICD-10-CM

## 2023-02-19 DIAGNOSIS — R000 Tachycardia, unspecified: Secondary | ICD-10-CM | POA: Diagnosis not present

## 2023-02-19 DIAGNOSIS — Z113 Encounter for screening for infections with a predominantly sexual mode of transmission: Secondary | ICD-10-CM

## 2023-02-19 DIAGNOSIS — Z0000 Encounter for general adult medical examination without abnormal findings: Secondary | ICD-10-CM

## 2023-02-19 DIAGNOSIS — E785 Hyperlipidemia, unspecified: Secondary | ICD-10-CM

## 2023-02-20 ENCOUNTER — Encounter: Payer: 59 | Admitting: Family Medicine

## 2023-02-21 LAB — TSH: TSH: 1.14 m[IU]/L

## 2023-02-21 LAB — CBC WITH DIFFERENTIAL/PLATELET
Absolute Monocytes: 700 {cells}/uL (ref 200–950)
Basophils Absolute: 53 {cells}/uL (ref 0–200)
Basophils Relative: 0.8 %
Eosinophils Absolute: 191 {cells}/uL (ref 15–500)
Eosinophils Relative: 2.9 %
HCT: 40.8 % (ref 35.0–45.0)
Hemoglobin: 13.1 g/dL (ref 11.7–15.5)
Lymphs Abs: 2462 {cells}/uL (ref 850–3900)
MCH: 30.7 pg (ref 27.0–33.0)
MCHC: 32.1 g/dL (ref 32.0–36.0)
MCV: 95.6 fL (ref 80.0–100.0)
MPV: 11.4 fL (ref 7.5–12.5)
Monocytes Relative: 10.6 %
Neutro Abs: 3194 {cells}/uL (ref 1500–7800)
Neutrophils Relative %: 48.4 %
Platelets: 294 10*3/uL (ref 140–400)
RBC: 4.27 10*6/uL (ref 3.80–5.10)
RDW: 11.2 % (ref 11.0–15.0)
Total Lymphocyte: 37.3 %
WBC: 6.6 10*3/uL (ref 3.8–10.8)

## 2023-02-21 LAB — LIPID PANEL
Cholesterol: 172 mg/dL (ref <200)
HDL: 60 mg/dL (ref >=50)
LDL Cholesterol (Calc): 75 mg/dL
Non-HDL Cholesterol (Calc): 112 mg/dL (ref <130)
Total CHOL/HDL Ratio: 2.9 (calc) (ref <5.0)
Triglycerides: 282 mg/dL — ABNORMAL HIGH (ref <150)

## 2023-02-21 LAB — COMPLETE METABOLIC PANEL WITH GFR
AG Ratio: 1.5 (calc) (ref 1.0–2.5)
ALT: 11 U/L (ref 6–29)
AST: 13 U/L (ref 10–30)
Albumin: 4.4 g/dL (ref 3.6–5.1)
Alkaline phosphatase (APISO): 56 U/L (ref 31–125)
BUN: 12 mg/dL (ref 7–25)
CO2: 25 mmol/L (ref 20–32)
Calcium: 9.4 mg/dL (ref 8.6–10.2)
Chloride: 107 mmol/L (ref 98–110)
Creat: 0.79 mg/dL (ref 0.50–0.99)
Globulin: 2.9 g/dL (ref 1.9–3.7)
Glucose, Bld: 86 mg/dL (ref 65–99)
Potassium: 4.9 mmol/L (ref 3.5–5.3)
Sodium: 140 mmol/L (ref 135–146)
Total Bilirubin: 0.4 mg/dL (ref 0.2–1.2)
Total Protein: 7.3 g/dL (ref 6.1–8.1)
eGFR: 95 mL/min/{1.73_m2} (ref >=60)

## 2023-02-21 LAB — RPR: RPR Ser Ql: NONREACTIVE

## 2023-02-21 LAB — HIV ANTIBODY (ROUTINE TESTING W REFLEX): HIV 1&2 Ab, 4th Generation: NONREACTIVE

## 2023-03-09 ENCOUNTER — Other Ambulatory Visit: Payer: Self-pay | Admitting: Family Medicine

## 2023-03-09 DIAGNOSIS — E559 Vitamin D deficiency, unspecified: Secondary | ICD-10-CM

## 2023-04-10 ENCOUNTER — Telehealth (INDEPENDENT_AMBULATORY_CARE_PROVIDER_SITE_OTHER): Payer: Managed Care, Other (non HMO) | Admitting: Adult Health

## 2023-04-10 DIAGNOSIS — G43109 Migraine with aura, not intractable, without status migrainosus: Secondary | ICD-10-CM

## 2023-04-10 NOTE — Progress Notes (Signed)
PATIENT: Tammy Garza DOB: 12/23/1980  REASON FOR VISIT: follow up HISTORY FROM: patient  Virtual Visit via Video Note  I connected with Alfredo Bach on 04/10/23 at  2:30 PM EST by a video enabled telemedicine application located remotely at [facility] and verified that I am speaking with the correct person using two identifiers who was located at their own home.   I discussed the limitations of evaluation and management by telemedicine and the availability of in person appointments. The patient expressed understanding and agreed to proceed.   PATIENT: Tammy Garza DOB: 03/19/1981  REASON FOR VISIT: follow up HISTORY FROM: patient  HISTORY OF PRESENT ILLNESS: Today 04/10/23:  Tammy Garza is a 42 y.o. female with a history of Migraine headaches. Returns today for follow-up. She was able to get back on Ajovy. Has about 2 migraines a month.  She does use Nurtec when she gets a migraine.  Reports that this typically works well for her.  She denies any new issues.  03/29/21: Tammy Garza is a 42 year old female with a history of migraine headaches.  She returns today for follow-up.  She reports that her headaches continue to be well controlled with Ajovy and Nurtec.  She continues to get site reaction with her injections.  Dates that he gets red swollen and itchy.  States that it does not spread over the leg.  She has not tried hydrocortisone cream nor Benadryl.  She does state that when we gave her the injection in the office she did not have the reaction.  She returns today for an evaluation.   09/21/20: Tammy Garza is a 42 year old female with a history of migraine headaches.  She returns today for follow-up.  She reports that her headaches have improved with Ajovy.  She only has 2-3 headache days a month.  Nurtec usually resolves the headache fairly quickly.  She states a few days before her next injection she typically will get a headache.  She has noticed  recently she has been getting a site reaction with her injections that would last for approximately 1 week.  She states that it is red and itchy.  She joins [place] today for virtual visit.  HISTORY Tammy Garza is a 42 y.o. female here as requested by Alba Cory, MD for migraines.  She has a past medical history of protein C deficiency, MGUS, DVT, vitamin D deficiency, migraine with aura, shiftwork sleep disorder.  I reviewed Dr. Rosalee Kaufman notes: She is to see Dr. Neale Burly but because he missed glioblastoma on her youngest daughter she would like to see someone else.  She is taking gabapentin since 2018, episodes down to about 3 times per month, she has associated nausea, photophobia and phonophobia, pain is described as temporal throbbing-like sometimes nuchal area, she has intermittent numbness on 1 side of her body sometimes severe nausea.   She is here alone and reports she has had them since a teen. Started right after menses. Mother, sister have migraines. She will have some with auras, spotting, she may also go numb in arms, she has photo/phono/osmophobia, change of weather can trigger and worsen her migraines, nausea, vomiting, pulsating/pounding/throbbing, jaws are sight, neck gets tight, they start in the temples unilaterally and then progress to the other side, movement makes them worse, she wants to go in a dark room. She has 19 headache days a month, at lest 9 of them are migraines that can be moderately severe to severe. She does not always get  an aura. She has taken Gabapentin in the past, compazine, baclofen. No other focal neurologic deficits, associated symptoms, inciting events or modifiable factors. Not positional. No changes in quality. Not exertional.    From a review of records, meds tried include: Baclofen, nerve blocks, compazine, topamax, gabapentin, amiytroiptyline, rizatriptan, baclofen, compazine, tylenol, ibuprofen(can't take now), thorazine, flexeril, decadron, nurtec. Ketorolac  inj, medrol dosepak, zofran, paxil, phenergan, effexor, trazodone, zonisa,mide.    Reviewed notes, labs and imaging from outside physicians, which showed: MRI brain 04/18/2018:  FINDINGS: Brain: No acute infarction, hemorrhage, hydrocephalus, extra-axial collection or mass lesion. Complete corpus callosum and vermis. Morphologically normal pituitary gland. No cerebellar tonsillar ectopia. No structural or signal abnormality of brain identified.   Vascular: Normal flow voids.   Skull and upper cervical spine: Normal marrow signal.   Sinuses/Orbits: Negative.   Other: None.   IMPRESSION: No acute abnormality identified.  Unremarkable MRI of the brain.  REVIEW OF SYSTEMS: Out of a complete 14 system review of symptoms, the patient complains only of the following symptoms, and all other reviewed systems are negative.  ALLERGIES: Allergies  Allergen Reactions   Topamax [Topiramate] Other (See Comments)    Confusion , slurred speech    Effexor [Venlafaxine]     Gas and bloating     HOME MEDICATIONS: Outpatient Medications Prior to Visit  Medication Sig Dispense Refill   Boric Acid CRYS Place 600 mg vaginally 2 (two) times a week. 500 g 3   clindamycin (CLEOCIN T) 1 % lotion Apply topically every morning.     FLUoxetine (PROZAC) 20 MG tablet Take by mouth.     Fremanezumab-vfrm (AJOVY) 225 MG/1.5ML SOAJ INJECT 225 MG INTO THE SKIN EVERY 30 (THIRTY) DAYS. 4.5 mL 2   glycopyrrolate (ROBINUL) 1 MG tablet Take 3 mg by mouth daily.     hydrOXYzine (ATARAX) 10 MG tablet SMARTSIG:1 Pill By Mouth Every Night PRN     NURTEC 75 MG TBDP Dissolve 1 tablet by mouth daily as needed. 30 tablet 1   propranolol (INDERAL) 10 MG tablet Take 5 mg by mouth 3 (three) times daily as needed.     rivaroxaban (XARELTO) 20 MG TABS tablet Take 1 tablet (20 mg total) by mouth daily. 90 tablet 3   traZODone (DESYREL) 100 MG tablet Take 100 mg by mouth at bedtime.     tretinoin (RETIN-A) 0.025 % cream Apply  topically.     Vitamin D, Ergocalciferol, (DRISDOL) 1.25 MG (50000 UNIT) CAPS capsule TAKE 1 CAPSULE (50,000 UNITS TOTAL) BY MOUTH EVERY 7 (SEVEN) DAYS 12 capsule 1   No facility-administered medications prior to visit.    PAST MEDICAL HISTORY: Past Medical History:  Diagnosis Date   Anemia    h/o   BRCA negative 01/2020   MyRisk neg except AXIN2 and RPS20 VUS   Cervical high risk human papillomavirus (HPV) DNA test positive    Chronic gastritis    Depression    DVT (deep venous thrombosis) (HCC) 09/2016   left leg   Family history of ovarian cancer    GERD (gastroesophageal reflux disease)    H. pylori infection    History of attempted suicide    History of gastric polyp    History of kidney stones    h/o   Hyperhidrosis    Increased risk of breast cancer 01/2020   IBIS=20.5%   Migraines    Protein C deficiency (HCC)    Rash    Shift work sleep disorder     PAST  SURGICAL HISTORY: Past Surgical History:  Procedure Laterality Date   ABDOMINAL HYSTERECTOMY     ANTERIOR CRUCIATE LIGAMENT REPAIR Left    BREAST BIOPSY Left 10/07/2019   left breast calcs x clip   CHOLECYSTECTOMY     CYSTOSCOPY  12/19/2016   Procedure: CYSTOSCOPY;  Surgeon: Nadara Mustard, MD;  Location: [facility] ORS;  Service: Gynecology;;   ESOPHAGOGASTRODUODENOSCOPY (EGD) WITH PROPOFOL N/A 09/25/2017   Procedure: ESOPHAGOGASTRODUODENOSCOPY (EGD) WITH PROPOFOL;  Surgeon: Wyline Mood, MD;  Location: [facility] ENDOSCOPY;  Service: Gastroenterology;  Laterality: N/A;   LAPAROSCOPIC HYSTERECTOMY Bilateral 12/19/2016   Procedure: HYSTERECTOMY TOTAL LAPAROSCOPIC BILATERAL SALPINGECTOMY;  Surgeon: Nadara Mustard, MD;  Location: [facility] ORS;  Service: Gynecology;  Laterality: Bilateral;   OTHER SURGICAL HISTORY     Knee surgery, 8/23 and 3/24   PLANTAR FASCIA SURGERY  06/2019   RECTAL EXAM UNDER ANESTHESIA N/A 02/26/2019   Procedure: RECTAL EXAM UNDER ANESTHESIA, EXCISION OF ANAL SKIN TAG;  Surgeon: Duanne Guess,  MD;  Location: [facility] ORS;  Service: General;  Laterality: N/A;   TUBAL LIGATION      FAMILY HISTORY: Family History  Problem Relation Age of Onset   Cancer Mother        multiple myeloma   Hypertension Mother    Obesity Mother    Migraines Mother    Heart failure Father    Cancer Father 20       lung cancer   Deep vein thrombosis Father    Protein C deficiency Father    COPD Father    Congestive Heart Failure Father    Cancer Sister        cervical   HIV Sister    Ovarian cancer Sister 75   Migraines Sister    Diabetes Brother    Hypertension Maternal Grandmother    COPD Paternal Grandmother    Deep vein thrombosis Paternal Grandmother    Asthma Daughter    Protein C deficiency Daughter    Brain cancer Daughter    Cancer Daughter    Migraines Daughter    Protein C deficiency Daughter    Migraines Daughter    Breast cancer Neg Hx     SOCIAL HISTORY: Social History   Socioeconomic History   Marital status: Divorced    Spouse name: Not on file   Number of children: 2   Years of education: 13   Highest education level: Associate degree: occupational, Scientist, product/process development, or vocational program  Occupational History   Occupation: Location manager   Tobacco Use   Smoking status: Former    Current packs/day: 0.00    Average packs/day: 0.3 packs/day for 5.0 years (1.3 ttl pk-yrs)    Types: Cigarettes    Start date: 05/15/2002    Quit date: 05/16/2007    Years since quitting: 15.9   Smokeless tobacco: Never  Vaping Use   Vaping status: Never Used  Substance and Sexual Activity   Alcohol use: Yes    Alcohol/week: 1.0 standard drink of alcohol    Types: 1 Glasses of wine per week    Comment: occ wine none last 24 hrs   Drug use: Not Currently   Sexual activity: Yes    Partners: Male    Birth control/protection: Surgical    Comment: hysterectomy   Other Topics Concern   Not on file  Social History Narrative   Separated since June 2016 - her youngest daughter 34 yo died of  complications of glioblastoma, has an older daughter that works.  Engaged to [employer]: Low Risk  (02/19/2023)   Overall Financial Resource Strain (CARDIA)    Difficulty of Paying Living Expenses: Not hard at all  Food Insecurity: No Food Insecurity (02/19/2023)   Hunger Vital Sign    Worried About Running Out of Food in the Last Year: Never true    Ran Out of Food in the Last Year: Never true  Transportation Needs: No Transportation Needs (02/19/2023)   PRAPARE - Administrator, Civil Service (Medical): No    Lack of Transportation (Non-Medical): No  Physical Activity: Insufficiently Active (02/19/2023)   Exercise Vital Sign    Days of Exercise per Week: 1 day    Minutes of Exercise per Session: 30 min  Stress: Stress Concern Present (02/19/2023)   [employer] of Occupational Health - Occupational Stress Questionnaire    Feeling of Stress : To some extent  Social Connections: Socially Isolated (02/19/2023)   Social Connection and Isolation Panel [NHANES]    Frequency of Communication with Friends and Family: More than three times a week    Frequency of Social Gatherings with Friends and Family: Once a week    Attends Religious Services: Never    Database administrator or Organizations: No    Attends Banker Meetings: Never    Marital Status: Never married  Intimate Partner Violence: Not At Risk (02/19/2023)   Humiliation, Afraid, Rape, and Kick questionnaire    Fear of Current or Ex-Partner: No    Emotionally Abused: No    Physically Abused: No    Sexually Abused: No      PHYSICAL EXAM Generalized: Well developed, in no acute distress   Neurological examination  Mentation: Alert oriented to time, place, history taking. Follows all commands speech and language fluent Cranial nerve II-XII: facial symmetry noted  DIAGNOSTIC DATA (LABS, IMAGING, TESTING) - I reviewed patient records,  labs, notes, testing and imaging myself where available.  Lab Results  Component Value Date   WBC 6.6 02/19/2023   HGB 13.1 02/19/2023   HCT 40.8 02/19/2023   MCV 95.6 02/19/2023   PLT 294 02/19/2023      Component Value Date/Time   NA 140 02/19/2023 1457   NA 141 07/26/2015 1244   NA 141 04/17/2014 0502   K 4.9 02/19/2023 1457   K 4.4 04/17/2014 0502   CL 107 02/19/2023 1457   CL 107 04/17/2014 0502   CO2 25 02/19/2023 1457   CO2 27 04/17/2014 0502   GLUCOSE 86 02/19/2023 1457   GLUCOSE 81 04/17/2014 0502   BUN 12 02/19/2023 1457   BUN 6 07/26/2015 1244   BUN 4 (L) 04/17/2014 0502   CREATININE 0.79 02/19/2023 1457   CALCIUM 9.4 02/19/2023 1457   CALCIUM 7.7 (L) 04/17/2014 0502   PROT 7.3 02/19/2023 1457   PROT 7.2 07/26/2015 1244   PROT 6.4 04/17/2014 0502   ALBUMIN 4.0 12/22/2021 0913   ALBUMIN 4.2 07/26/2015 1244   ALBUMIN 3.1 (L) 04/17/2014 0502   AST 13 02/19/2023 1457   AST 14 (L) 04/17/2014 0502   ALT 11 02/19/2023 1457   ALT 13 (L) 04/17/2014 0502   ALKPHOS 41 12/22/2021 0913   ALKPHOS 44 (L) 04/17/2014 0502   BILITOT 0.4 02/19/2023 1457   BILITOT 0.6 07/26/2015 1244   BILITOT 0.9 04/17/2014 0502   GFRNONAA >60 10/27/2022 1015   GFRNONAA 110 09/10/2020 0950   GFRAA 127 09/10/2020 0950  Lab Results  Component Value Date   CHOL 172 02/19/2023   HDL 60 02/19/2023   LDLCALC 75 02/19/2023   TRIG 282 (H) 02/19/2023   CHOLHDL 2.9 02/19/2023   Lab Results  Component Value Date   HGBA1C 4.8 02/08/2022   Lab Results  Component Value Date   VITAMINB12 665 07/30/2018   Lab Results  Component Value Date   TSH 1.14 02/19/2023      ASSESSMENT AND PLAN 42 y.o. year old female  has a past medical history of Anemia, BRCA negative (01/2020), Cervical high risk human papillomavirus (HPV) DNA test positive, Chronic gastritis, Depression, DVT (deep venous thrombosis) (HCC) (09/2016), Family history of ovarian cancer, GERD (gastroesophageal reflux disease),  H. pylori infection, History of attempted suicide, History of gastric polyp, History of kidney stones, Hyperhidrosis, Increased risk of breast cancer (01/2020), Migraines, Protein C deficiency (HCC), Rash, and Shift work sleep disorder. here with :  1.  Migraine headaches  -- Continue Ajovy monthly injection -- Continue Nurtec 75 mg 1 tablet in 24 hours for abortive therapy as needed -Follow-up in 1 year or sooner if needed    Butch Penny, MSN, NP-C 04/10/2023, 1:59 PM [facility] [address] [place], [place] [address] [phone]

## 2023-04-13 ENCOUNTER — Ambulatory Visit: Payer: 59 | Admitting: Family Medicine

## 2023-04-13 ENCOUNTER — Other Ambulatory Visit: Payer: Self-pay | Admitting: Family Medicine

## 2023-04-21 ENCOUNTER — Other Ambulatory Visit: Payer: Self-pay | Admitting: Adult Health

## 2023-05-10 ENCOUNTER — Other Ambulatory Visit: Payer: Self-pay | Admitting: Family Medicine

## 2023-05-17 ENCOUNTER — Encounter: Payer: Self-pay | Admitting: Adult Health

## 2023-05-18 ENCOUNTER — Encounter: Payer: Self-pay | Admitting: Family Medicine

## 2023-05-18 ENCOUNTER — Telehealth: Payer: Self-pay

## 2023-05-18 ENCOUNTER — Other Ambulatory Visit (HOSPITAL_COMMUNITY): Payer: Self-pay

## 2023-05-18 NOTE — Telephone Encounter (Signed)
 Pharmacy Patient Advocate Encounter  Received notification from [facility] that Prior Authorization for AJOVY  (fremanezumab -vfrm) injection 225MG /1.5ML auto-injectors has been APPROVED from 05/18/2023 to 05/17/2024. Ran test claim, Copay is $24.98 per 30DS. This test claim was processed through [facility]- copay amounts may vary at other pharmacies due to pharmacy/plan contracts, or as the patient moves through the different stages of their insurance plan.   PA #/Case ID/Reference #: PA Case ID #: [id number]  Key: [id number]

## 2023-05-21 ENCOUNTER — Ambulatory Visit
Admission: RE | Admit: 2023-05-21 | Discharge: 2023-05-21 | Disposition: A | Discharge status: Home / Self Care | Payer: Managed Care, Other (non HMO) | Source: Ambulatory Visit | Attending: Family Medicine | Admitting: Family Medicine

## 2023-05-21 ENCOUNTER — Ambulatory Visit (INDEPENDENT_AMBULATORY_CARE_PROVIDER_SITE_OTHER): Payer: Managed Care, Other (non HMO) | Admitting: Family Medicine

## 2023-05-21 ENCOUNTER — Encounter: Payer: Self-pay | Admitting: Family Medicine

## 2023-05-21 VITALS — BP 119/77 | HR 63 | Resp 16 | Ht 66.0 in | Wt 186.0 lb

## 2023-05-21 DIAGNOSIS — N6322 Unspecified lump in the left breast, upper inner quadrant: Secondary | ICD-10-CM | POA: Insufficient documentation

## 2023-05-21 DIAGNOSIS — Z1331 Encounter for screening for depression: Secondary | ICD-10-CM | POA: Diagnosis not present

## 2023-05-21 DIAGNOSIS — R200 Anesthesia of skin: Secondary | ICD-10-CM | POA: Insufficient documentation

## 2023-05-21 DIAGNOSIS — H539 Unspecified visual disturbance: Secondary | ICD-10-CM | POA: Insufficient documentation

## 2023-05-21 NOTE — Progress Notes (Signed)
 Acute Office Visit  Introduced to nurse practitioner role and practice setting.  All questions answered.  Discussed provider/patient relationship and expectations.   Subjective:     Patient ID: Tammy Garza, female    DOB: 1980/07/31, 43 y.o.   MRN: [id number]  Chief Complaint  Patient presents with   Breast Mass    Lump in left breast near the nipple, found Friday AM, sensitive to touch, some redness around the area of the lump, no discharge noted from nipple    Pt presents with concerns for L breast lump/nodule with tenderness since 05/18/23. She has a hx of partial hysterectomy - just ovaries left. Denies nipple changes, redness, swelling, skin changes, discharge or dimpling. No family hx of breast CA, but personal hx of fibrocystic changes, which they placed L breast clip in for monitoring in 2021. She describes It as a knot tender to touch, also feels L nipple is flatter than normal. R breast no issues. No fevers, chills, or recent infections.   She also has concerns for vision changes, blurred and double since yesterday. States hx of migraines, but these aura was different. She had R hand numbness and lack of mobility in R hand/fingers for about 2-3 hours. Still has slight headache today and vision continues to be blurry despite her prescription eye wear.  Sunday night. She has neurologist, normal migraine last 45-1hr with auras typical of jaw locking and spots in visions, but this was different, felt I was having stroke was facetiming daughter to use bathroom to make sure she was okay. She took a nurtec, and eventually R hand numbness went away, but still have vision issues. Today. Pt's daughter died from GBM. No hx of HTN.       Review of Systems  All other systems reviewed and are negative.     05/21/2023   10:52 AM 04/12/2022   10:34 AM 08/12/2021    3:59 PM 07/26/2020    3:06 PM  GAD 7 : Generalized Anxiety Score  Nervous, Anxious, on Edge 1 1 2 3   Control/stop worrying  0 0 1 3  Worry too much - different things 1 0 0 2  Trouble relaxing 3 2 1 2   Restless 0 2 1 2   Easily annoyed or irritable 1 1 1 1   Afraid - awful might happen 0 0 1 1  Total GAD 7 Score 6 6 7 14   Anxiety Difficulty Very difficult Very difficult Somewhat difficult         05/21/2023   10:52 AM 02/19/2023    2:05 PM 04/12/2022   10:25 AM  PHQ9 SCORE ONLY  PHQ-9 Total Score 16 4 10        Objective:    BP 119/77   Pulse 63   Resp 16   Ht 5' 6 (1.676 m)   Wt 186 lb (84.4 kg)   LMP 01/04/2016 Comment: partial hysterectomy, tubal   SpO2 96%   BMI 30.02 kg/m    Physical Exam Constitutional:      Appearance: Normal appearance. She is normal weight.  HENT:     Head: Normocephalic.     Nose: Nose normal.     Mouth/Throat:     Mouth: Mucous membranes are moist.     Pharynx: Oropharynx is clear.  Eyes:     Extraocular Movements: Extraocular movements intact.     Conjunctiva/sclera: Conjunctivae normal.     Pupils: Pupils are equal, round, and reactive to light.  Cardiovascular:  Rate and Rhythm: Normal rate and regular rhythm.     Pulses: Normal pulses.     Heart sounds: Normal heart sounds. No murmur heard. Pulmonary:     Effort: Pulmonary effort is normal. No respiratory distress.     Breath sounds: Normal breath sounds. No stridor. No wheezing, rhonchi or rales.  Chest:  Breasts:    Right: Normal.     Left: Mass and tenderness present. No swelling, bleeding, inverted nipple, nipple discharge or skin change.       Comments: L breast tender mass at 10-11 o'clock. Asymmterical in borders, about 2x3cm in mass per palpation Musculoskeletal:     Cervical back: Normal range of motion. No tenderness.  Lymphadenopathy:     Cervical: No cervical adenopathy.     Upper Body:     Right upper body: No supraclavicular, axillary or pectoral adenopathy.     Left upper body: No supraclavicular, axillary or pectoral adenopathy.  Skin:    General: Skin is warm and dry.      Capillary Refill: Capillary refill takes less than 2 seconds.  Neurological:     Mental Status: She is alert. Mental status is at baseline.     GCS: GCS eye subscore is 4. GCS verbal subscore is 5. GCS motor subscore is 6.     Cranial Nerves: No cranial nerve deficit, dysarthria or facial asymmetry.     Sensory: Sensation is intact.     Motor: Motor function is intact.     Gait: Gait is intact.     Comments: Pt had dizziness with EOMs, Able to discern objects in vision, but pt states blurry compared to baseline. Otherwise cranial nerves intact.    No results found for any visits on 05/21/23.      Assessment & Plan:   Problem List Items Addressed This Visit       Other   Mass of upper inner quadrant of left breast - Primary   Hx of fibrocystic changes to L breast with clip in place based on mammogram 4 years ago with L breast calcifications.  Most recent mammogram - negative, annual screening recommended.  - Today Left breast nodule/mass palpable at 10/11 o'clock area. Tenderness to palpation. HX of partial hysterectomy, denies discharge, swelling, skin changes. L nipple pt feels slightly flatter than normal.  - Hormonal changes versus benign mass vs malignancy - Given mammogram hx and symptoms today - ordered diagnostic mammogram and L breast US .       Relevant Orders   MM 3D DIAGNOSTIC MAMMOGRAM UNILATERAL LEFT BREAST   US  LIMITED ULTRASOUND INCLUDING AXILLA LEFT BREAST    Vision changes   Hx of migraines with spotting, last night(05/20/23) had migraine with different auras (numbness and decreased mobility in R hand) and blurred/double vision. Pt still having vision issues today despite prescription glasses, otherwise neuor intact with exam. She felt she was having a stroke last night and daughter died of GBM.  - Given these auras were different than typical migraine, and pt still having vision changes today recommend urgent head CT w/o contrast to r/o any acute findings or changes.   - Discussed if this happens again pt needs to present to ED for stroke symptoms and not wait, as strokes are an emergent gent issues.  - No hx of HTN.        Relevant Orders   CT HEAD WO CONTRAST ( )   Numbness of right hand   Pt concerns for acute R hand numbness and  decreased mobility on 05/20/23 for 2-3 hours during migraine. Never had this aura before, she felt she was having a stroke. Subsided and normal sensation and mobility today. Concerns for new aura/ headache changes. Vision changes still present today, neuro intact otherwise during exam. Vital stable. Pt's daughter died from GBM.  - Ordered head CT w/o contrast to r/o any acute findings.  - F/u with neurology - based on CT results and future symptoms may need further w/u with MRI.       Relevant Orders   CT HEAD WO CONTRAST ( )   Positive depression screening   PHQ9= 16 Recommend future screening and discussion on this scoring with PCP need for counseling or medication. Today acute visit. Pt hx of anxiety and use of hydroxyzine  as needed for anxiety.          No orders of the defined types were placed in this encounter.   Return if symptoms worsen or fail to improve.  I, Curtis DELENA Boom, FNP, have reviewed all documentation for this visit. The documentation on 05/21/23 for the exam, diagnosis, procedures, and orders are all accurate and complete.   Curtis DELENA Boom, FNP

## 2023-05-21 NOTE — Assessment & Plan Note (Addendum)
 Pt concerns for acute R hand numbness and decreased mobility on 05/20/23 for 2-3 hours during migraine. Never had this aura before, she felt she was having a stroke. Subsided and normal sensation and mobility today. Concerns for new aura/ headache changes. Vision changes still present today, neuro intact otherwise during exam. Vital stable. Pt's daughter died from GBM.  - Ordered head CT w/o contrast to r/o any acute findings.  - F/u with neurology - based on CT results and future symptoms may need further w/u with MRI.

## 2023-05-21 NOTE — Assessment & Plan Note (Addendum)
 Hx of fibrocystic changes to L breast with clip in place based on mammogram 4 years ago with L breast calcifications.  Most recent mammogram - negative, annual screening recommended.  - Today Left breast nodule/mass palpable at 10/11 o'clock area. Tenderness to palpation. HX of partial hysterectomy, denies discharge, swelling, skin changes. L nipple pt feels slightly flatter than normal.  - Hormonal changes versus benign mass vs malignancy - Given mammogram hx and symptoms today - ordered diagnostic mammogram and L breast US .

## 2023-05-21 NOTE — Assessment & Plan Note (Addendum)
 PHQ9= 16 Recommend future screening and discussion on this scoring with PCP need for counseling or medication. Today acute visit. Pt hx of anxiety and use of hydroxyzine as needed for anxiety.

## 2023-05-21 NOTE — Assessment & Plan Note (Addendum)
 Hx of migraines with spotting, last night(05/20/23) had migraine with different auras (numbness and decreased mobility in R hand) and blurred/double vision. Pt still having vision issues today despite prescription glasses, otherwise neuor intact with exam. She felt she was having a stroke last night and daughter died of GBM.  - Given these auras were different than typical migraine, and pt still having vision changes today recommend urgent head CT w/o contrast to r/o any acute findings or changes.  - Discussed if this happens again pt needs to present to ED for stroke symptoms and not wait, as strokes are an emergent gent issues.  - No hx of HTN.

## 2023-05-31 ENCOUNTER — Ambulatory Visit
Admission: RE | Admit: 2023-05-31 | Discharge: 2023-05-31 | Disposition: A | Discharge status: Home / Self Care | Payer: Managed Care, Other (non HMO) | Source: Ambulatory Visit | Attending: Family Medicine | Admitting: Family Medicine

## 2023-05-31 ENCOUNTER — Other Ambulatory Visit: Payer: Self-pay | Admitting: Family Medicine

## 2023-05-31 DIAGNOSIS — N6322 Unspecified lump in the left breast, upper inner quadrant: Secondary | ICD-10-CM | POA: Insufficient documentation

## 2023-06-01 ENCOUNTER — Other Ambulatory Visit: Payer: Self-pay

## 2023-06-01 MED ORDER — TRETINOIN 0.025 % EX CREA
TOPICAL_CREAM | Freq: Every day | CUTANEOUS | 0 refills | Status: AC
  Filled 2023-06-01 – 2024-02-09 (×6): qty 45, 30d supply, fill #0

## 2023-06-04 ENCOUNTER — Telehealth: Payer: Self-pay

## 2023-06-04 NOTE — Telephone Encounter (Signed)
What dx code would you like for me to use for this PA? Under problem list nothing to relate

## 2023-06-04 NOTE — Telephone Encounter (Signed)
Called pt to verify what is she exactly using the TRETINOIN Cream to be able to do her Prior authorization for insurance to be able to cover.

## 2023-06-04 NOTE — Telephone Encounter (Signed)
Attn: CMA Felipa Furnace  This medication tretinoin (RETIN-A) 0.025 % cream is used for acne.

## 2023-06-05 NOTE — Telephone Encounter (Signed)
No answer from pt left detailed vm. °

## 2023-06-08 ENCOUNTER — Other Ambulatory Visit: Payer: Self-pay

## 2023-06-10 ENCOUNTER — Other Ambulatory Visit: Payer: Self-pay

## 2023-06-11 ENCOUNTER — Other Ambulatory Visit: Payer: Self-pay

## 2023-06-26 ENCOUNTER — Other Ambulatory Visit: Payer: Self-pay

## 2023-06-27 ENCOUNTER — Other Ambulatory Visit: Payer: Self-pay

## 2023-07-11 ENCOUNTER — Other Ambulatory Visit: Payer: Self-pay

## 2023-07-13 ENCOUNTER — Other Ambulatory Visit: Payer: Self-pay

## 2023-07-13 MED ORDER — RIVAROXABAN 20 MG PO TABS: 20.0000 mg | ORAL_TABLET | Freq: Every day | ORAL | 3 refills | Status: DC

## 2023-07-13 NOTE — Telephone Encounter (Signed)
 Refill request for Xarelto to be sent to new pharmacy [facility]

## 2023-09-03 ENCOUNTER — Other Ambulatory Visit: Payer: Self-pay | Admitting: Family Medicine

## 2023-09-03 DIAGNOSIS — Z1231 Encounter for screening mammogram for malignant neoplasm of breast: Secondary | ICD-10-CM

## 2023-10-05 ENCOUNTER — Ambulatory Visit
Admission: RE | Admit: 2023-10-05 | Discharge: 2023-10-05 | Disposition: A | Discharge status: Home / Self Care | Source: Ambulatory Visit | Attending: Family Medicine | Admitting: Family Medicine

## 2023-10-05 DIAGNOSIS — Z1231 Encounter for screening mammogram for malignant neoplasm of breast: Secondary | ICD-10-CM | POA: Diagnosis present

## 2023-10-11 ENCOUNTER — Ambulatory Visit: Payer: Self-pay | Admitting: Family Medicine

## 2023-10-11 ENCOUNTER — Other Ambulatory Visit: Payer: Self-pay | Admitting: Family Medicine

## 2023-10-11 DIAGNOSIS — R928 Other abnormal and inconclusive findings on diagnostic imaging of breast: Secondary | ICD-10-CM

## 2023-10-12 ENCOUNTER — Telehealth: Payer: Self-pay

## 2023-10-12 NOTE — Telephone Encounter (Signed)
 Copied from CRM [id number]. Topic: Clinical - Lab/Test Results >> Oct 12, 2023  4:07 PM Fonda T wrote: Reason for CRM: Patient calling to discuss mammogram results reviewed via Mychart.  Patient is requesting a return call to discuss further.  Patient can be reached at [phone]

## 2023-10-16 ENCOUNTER — Other Ambulatory Visit

## 2023-10-16 ENCOUNTER — Encounter

## 2023-10-16 NOTE — Progress Notes (Unsigned)
 PCP:  Sowles, Krichna, MD   No chief complaint on file.    HPI:      Tammy Garza is a 43 y.o. [id number] whose LMP was Patient's last menstrual period was 01/04/2016., presents today for her annual examination.  Her menses are absent due to hyst for leio/AUB. No PMB. Has terrible vasomotor sx, can't have ERT.  Hx of migraines and DVT.  Sex activity: single partner, contraception - status post hysterectomy. Has vaginal dryness, improved with lubricants.  Last Pap: 09/12/22 Results were: NILM/neg HPV DNA; POS HPV DNA 2017  Last mammogram: ### Results were: normal--routine follow-up in 12 months; has appt through PCP addl views 6/3/ There is no FH of breast cancer. There is a FH of ovarian cancer in her sister, colon cancer in her mat uncle and lost a daughter to glioblastoma. Pt is MyRisk neg except AXIN2 and RPS20 VUS 9/21. IBIS=20.5%.  The patient does do self-breast exams. Noticed a tender area RT breast intermittently for past 1 1/2 months; noticed a breast mass where tender--imaging 5/24 showed fibrocystic changes.   Tobacco use: The patient denies current or previous tobacco use. Alcohol use: social drinker No drug use.  Exercise: moderately active  She does get adequate calcium  and Vitamin D  in her diet.  Patient Active Problem List   Diagnosis Date Noted   Mass of upper inner quadrant of left breast 05/21/2023   Vision changes 05/21/2023   Numbness of right hand 05/21/2023   Positive depression screening 05/21/2023   Increased risk of breast cancer 09/12/2022   Right Achilles tendinitis 04/12/2022   GAD (generalized anxiety disorder) 04/12/2022   Chronic migraine without aura without status migrainosus, not intractable 05/09/2020   Family history of ovarian cancer 03/23/2020   Pelvic mass in female 03/23/2020   Abnormal mammogram 10/15/2019   History of hemorrhoidectomy    Hypocalcemia 10/13/2018   Protein C deficiency (HCC) 12/10/2016   Monoclonal gammopathy of  unknown significance (MGUS) 11/30/2016   History of DVT (deep vein thrombosis) 11/02/2016   Elevated blood protein 09/26/2016   Chronic deep vein thrombosis (DVT) of distal vein of left lower extremity (HCC) 09/17/2016   Vitamin D  deficiency 07/26/2015   Thyroid  cyst 07/26/2015   History of Helicobacter pylori infection 04/22/2015   Migraine with aura and without status migrainosus 04/22/2015   Shift work sleep disorder 04/22/2015    Past Surgical History:  Procedure Laterality Date   ABDOMINAL HYSTERECTOMY     ANTERIOR CRUCIATE LIGAMENT REPAIR Left    BREAST BIOPSY Left 10/07/2019   left breast calcs x clip   CHOLECYSTECTOMY     CYSTOSCOPY  12/19/2016   Procedure: CYSTOSCOPY;  Surgeon: Alben Alma, MD;  Location: [facility];  Service: Gynecology;;   ESOPHAGOGASTRODUODENOSCOPY (EGD) WITH PROPOFOL  N/A 09/25/2017   Procedure: ESOPHAGOGASTRODUODENOSCOPY (EGD) WITH PROPOFOL ;  Surgeon: Luke Salaam, MD;  Location: [facility];  Service: Gastroenterology;  Laterality: N/A;   LAPAROSCOPIC HYSTERECTOMY Bilateral 12/19/2016   Procedure: HYSTERECTOMY TOTAL LAPAROSCOPIC BILATERAL SALPINGECTOMY;  Surgeon: Alben Alma, MD;  Location: [facility];  Service: Gynecology;  Laterality: Bilateral;   OTHER SURGICAL HISTORY     Knee surgery, 8/23 and 3/24   PLANTAR FASCIA SURGERY  06/2019   RECTAL EXAM UNDER ANESTHESIA N/A 02/26/2019   Procedure: RECTAL EXAM UNDER ANESTHESIA, EXCISION OF ANAL SKIN TAG;  Surgeon: Mercy Stall, MD;  Location: [facility] OR[facility];  Service: General;  Laterality: N/A;   TUBAL LIGATION      Family History  Problem  Relation Age of Onset   Cancer Mother        multiple myeloma   Hypertension Mother    Obesity Mother    Migraines Mother    Heart failure Father    Cancer Father 49       lung cancer   Deep vein thrombosis Father    Protein C deficiency Father    COPD Father    Congestive Heart Failure Father    Cancer Sister        cervical   HIV Sister    Ovarian  cancer Sister 17   Migraines Sister    Diabetes Brother    Hypertension Maternal Grandmother    COPD Paternal Grandmother    Deep vein thrombosis Paternal Grandmother    Asthma Daughter    Protein C deficiency Daughter    Brain cancer Daughter    Cancer Daughter    Migraines Daughter    Protein C deficiency Daughter    Migraines Daughter    Breast cancer Neg Hx     Social History   Socioeconomic History   Marital status: Divorced    Spouse name: Not on file   Number of children: 2   Years of education: 13   Highest education level: Associate degree: occupational, Scientist, product/process development, or vocational program  Occupational History   Occupation: Location manager   Tobacco Use   Smoking status: Former    Current packs/day: 0.00    Average packs/day: 0.3 packs/day for 5.0 years (1.3 ttl pk-yrs)    Types: Cigarettes    Start date: 05/15/2002    Quit date: 05/16/2007    Years since quitting: 16.4   Smokeless tobacco: Never  Vaping Use   Vaping status: Never Used  Substance and Sexual Activity   Alcohol use: Yes    Alcohol/week: 1.0 standard drink of alcohol    Types: 1 Glasses of wine per week    Comment: occ wine none last 24 hrs   Drug use: Not Currently   Sexual activity: Yes    Partners: Male    Birth control/protection: Surgical    Comment: hysterectomy   Other Topics Concern   Not on file  Social History Narrative   Separated since June 2016 - her youngest daughter 28 yo died of complications of glioblastoma, has an older daughter that works.     Engaged to [employer]: Low Risk  (02/19/2023)   Overall Financial Resource Strain (CARDIA)    Difficulty of Paying Living Expenses: Not hard at all  Food Insecurity: No Food Insecurity (02/19/2023)   Hunger Vital Sign    Worried About Running Out of Food in the Last Year: Never true    Ran Out of Food in the Last Year: Never true  Transportation Needs: No Transportation Needs  (02/19/2023)   PRAPARE - Administrator, Civil Service (Medical): No    Lack of Transportation (Non-Medical): No  Physical Activity: Insufficiently Active (02/19/2023)   Exercise Vital Sign    Days of Exercise per Week: 1 day    Minutes of Exercise per Session: 30 min  Stress: Stress Concern Present (02/19/2023)   [employer] of Occupational Health - Occupational Stress Questionnaire    Feeling of Stress : To some extent  Social Connections: Socially Isolated (02/19/2023)   Social Connection and Isolation Panel [NHANES]    Frequency of Communication with Friends and Family: More than three times a week  Frequency of Social Gatherings with Friends and Family: Once a week    Attends Religious Services: Never    Database administrator or Organizations: No    Attends Banker Meetings: Never    Marital Status: Never married  Intimate Partner Violence: Not At Risk (02/19/2023)   Humiliation, Afraid, Rape, and Kick questionnaire    Fear of Current or Ex-Partner: No    Emotionally Abused: No    Physically Abused: No    Sexually Abused: No     Current Outpatient Medications:    Boric Acid CRYS, Place 600 mg vaginally 2 (two) times a week., Disp: 500 g, Rfl: 3   clindamycin  (CLEOCIN  T) 1 % lotion, Apply topically every morning., Disp: , Rfl:    Fremanezumab -vfrm (AJOVY ) 225 MG/1.5ML SOAJ, INJECT 225 MG INTO THE SKIN EVERY 30 (THIRTY) DAYS., Disp: 4.5 mL, Rfl: 2   glycopyrrolate  (ROBINUL ) 1 MG tablet, Take 3 mg by mouth daily., Disp: , Rfl:    hydrOXYzine  (ATARAX ) 10 MG tablet, SMARTSIG:1 Pill By Mouth Every Night PRN, Disp: , Rfl:    NURTEC 75 MG TBDP, DISSOLVE 1 TABLET BY MOUTH DAILY AS NEEDED., Disp: 8 tablet, Rfl: 7   rivaroxaban  (XARELTO ) 20 MG TABS tablet, Take 1 tablet (20 mg total) by mouth daily., Disp: 90 tablet, Rfl: 3   traZODone  (DESYREL ) 100 MG tablet, Take 100 mg by mouth at bedtime., Disp: , Rfl:    tretinoin  (RETIN-A ) 0.025 % cream, Apply  topically at bedtime., Disp: 45 g, Rfl: 0   Vitamin D , Ergocalciferol , (DRISDOL ) 1.25 MG (50000 UNIT) CAPS capsule, TAKE 1 CAPSULE (50,000 UNITS TOTAL) BY MOUTH EVERY 7 (SEVEN) DAYS, Disp: 12 capsule, Rfl: 1     ROS:  Review of Systems  Constitutional:  Negative for fatigue, fever and unexpected weight change.  Respiratory:  Negative for cough, shortness of breath and wheezing.   Cardiovascular:  Negative for chest pain, palpitations and leg swelling.  Gastrointestinal:  Negative for blood in stool, constipation, diarrhea, nausea and vomiting.  Endocrine: Negative for cold intolerance, heat intolerance and polyuria.  Genitourinary:  Negative for dyspareunia, dysuria, flank pain, frequency, genital sores, hematuria, menstrual problem, pelvic pain, urgency, vaginal bleeding, vaginal discharge and vaginal pain.  Musculoskeletal:  Negative for back pain, joint swelling and myalgias.  Skin:  Negative for rash.  Neurological:  Negative for dizziness, syncope, light-headedness, numbness and headaches.  Hematological:  Negative for adenopathy.  Psychiatric/Behavioral:  Positive for agitation and dysphoric mood. Negative for confusion, sleep disturbance and suicidal ideas. The patient is not nervous/anxious.    BREAST: mass/tenderness   Objective: LMP 01/04/2016 Comment: partial hysterectomy, tubal    Physical Exam Constitutional:      Appearance: She is well-developed.  Genitourinary:     Vulva normal.     Genitourinary Comments: UTERUS/CX SURG REM     Right Labia: No rash, tenderness or lesions.    Left Labia: No tenderness, lesions or rash.    Vaginal cuff intact.    No vaginal discharge, erythema or tenderness.      Right Adnexa: not tender and no mass present.    Left Adnexa: not tender and no mass present.    Cervix is absent.     Uterus is absent.  Breasts:    Right: Mass and tenderness present. No nipple discharge or skin change.     Left: No mass, nipple discharge, skin  change or tenderness.  Neck:     Thyroid : No thyromegaly.  Cardiovascular:  Rate and Rhythm: Normal rate and regular rhythm.     Heart sounds: Normal heart sounds. No murmur heard. Pulmonary:     Effort: Pulmonary effort is normal.     Breath sounds: Normal breath sounds.  Chest:    Abdominal:     Palpations: Abdomen is soft.     Tenderness: There is no abdominal tenderness. There is no guarding.  Musculoskeletal:        General: Normal range of motion.     Cervical back: Normal range of motion.  Neurological:     General: No focal deficit present.     Mental Status: She is alert and oriented to person, place, and time.     Cranial Nerves: No cranial nerve deficit.  Skin:    General: Skin is warm and dry.  Psychiatric:        Mood and Affect: Mood normal.        Behavior: Behavior normal.        Thought Content: Thought content normal.        Judgment: Judgment normal.  Vitals reviewed.    Assessment/Plan: Encounter for annual routine gynecological examination  Cervical cancer screening - Plan: Cytology - PAP  Screening for HPV (human papillomavirus) - Plan: Cytology - PAP  Encounter for screening mammogram for malignant neoplasm of breast - Plan: US  LIMITED ULTRASOUND INCLUDING AXILLA RIGHT BREAST, US  LIMITED ULTRASOUND INCLUDING AXILLA LEFT BREAST , MM 3D DIAGNOSTIC MAMMOGRAM BILATERAL BREAST  Increased risk of breast cancer - Plan: US  LIMITED ULTRASOUND INCLUDING AXILLA RIGHT BREAST, US  LIMITED ULTRASOUND INCLUDING AXILLA LEFT BREAST , MM 3D DIAGNOSTIC MAMMOGRAM BILATERAL BREAST; aware of recommendations of monthly SBE, yearly CBE and mammos, as well as scr breast MRI. Will call for MRI ref prn.   Family history of ovarian cancer--pt is MyRisk neg.   Mass of upper outer quadrant of right breast - Plan: US  LIMITED ULTRASOUND INCLUDING AXILLA RIGHT BREAST, US  LIMITED ULTRASOUND INCLUDING AXILLA LEFT BREAST , MM 3D DIAGNOSTIC MAMMOGRAM BILATERAL BREAST; mass at 11:30  position RT breast. Pt to call to schedule dx mammo and u/s. Will f/u with results.       GYN counsel breast self exam, mammography screening, adequate intake of calcium  and vitamin D , diet and exercise     F/U  No follow-ups on file.  Tameisha Covell B. Tallyn Holroyd, PA-C 10/16/2023 1:02 PM

## 2023-10-18 ENCOUNTER — Other Ambulatory Visit (HOSPITAL_COMMUNITY)
Admission: RE | Admit: 2023-10-18 | Discharge: 2023-10-18 | Disposition: A | Discharge status: Home / Self Care | Source: Ambulatory Visit | Attending: Obstetrics and Gynecology | Admitting: Obstetrics and Gynecology

## 2023-10-18 ENCOUNTER — Encounter: Payer: Self-pay | Admitting: Obstetrics and Gynecology

## 2023-10-18 ENCOUNTER — Ambulatory Visit (INDEPENDENT_AMBULATORY_CARE_PROVIDER_SITE_OTHER): Admitting: Obstetrics and Gynecology

## 2023-10-18 VITALS — BP 111/73 | HR 76 | Ht 66.0 in | Wt 182.0 lb

## 2023-10-18 DIAGNOSIS — Z01419 Encounter for gynecological examination (general) (routine) without abnormal findings: Secondary | ICD-10-CM | POA: Diagnosis not present

## 2023-10-18 DIAGNOSIS — Z9189 Other specified personal risk factors, not elsewhere classified: Secondary | ICD-10-CM

## 2023-10-18 DIAGNOSIS — Z113 Encounter for screening for infections with a predominantly sexual mode of transmission: Secondary | ICD-10-CM

## 2023-10-18 DIAGNOSIS — R928 Other abnormal and inconclusive findings on diagnostic imaging of breast: Secondary | ICD-10-CM

## 2023-10-18 DIAGNOSIS — Z8041 Family history of malignant neoplasm of ovary: Secondary | ICD-10-CM

## 2023-10-18 DIAGNOSIS — N951 Menopausal and female climacteric states: Secondary | ICD-10-CM

## 2023-10-18 DIAGNOSIS — Z1231 Encounter for screening mammogram for malignant neoplasm of breast: Secondary | ICD-10-CM

## 2023-10-18 NOTE — Patient Instructions (Signed)
 I value your feedback and you entrusting [place] with your care. If you get a [place] patient survey, I would appreciate you taking the time to let us know about your experience today. Thank you! ? ? ?

## 2023-10-19 ENCOUNTER — Ambulatory Visit
Admission: RE | Admit: 2023-10-19 | Discharge: 2023-10-19 | Disposition: A | Discharge status: Home / Self Care | Source: Ambulatory Visit | Attending: Family Medicine | Admitting: Family Medicine

## 2023-10-19 DIAGNOSIS — R928 Other abnormal and inconclusive findings on diagnostic imaging of breast: Secondary | ICD-10-CM | POA: Diagnosis present

## 2023-10-19 LAB — CERVICOVAGINAL ANCILLARY ONLY
Chlamydia: NEGATIVE
Comment: NEGATIVE
Comment: NEGATIVE
Comment: NORMAL
Neisseria Gonorrhea: NEGATIVE
Trichomonas: NEGATIVE

## 2023-10-20 LAB — HIV ANTIBODY (ROUTINE TESTING W REFLEX): HIV Screen 4th Generation wRfx: NONREACTIVE

## 2023-10-20 LAB — RPR: RPR Ser Ql: NONREACTIVE

## 2023-10-20 LAB — HEPATITIS C ANTIBODY: Hep C Virus Ab: NONREACTIVE

## 2023-11-01 ENCOUNTER — Other Ambulatory Visit: Payer: Self-pay

## 2023-11-01 DIAGNOSIS — D472 Monoclonal gammopathy: Secondary | ICD-10-CM

## 2023-11-02 ENCOUNTER — Encounter: Payer: Self-pay | Admitting: Oncology

## 2023-11-02 ENCOUNTER — Other Ambulatory Visit: Payer: 59

## 2023-11-02 ENCOUNTER — Ambulatory Visit: Payer: 59 | Admitting: Oncology

## 2023-11-02 ENCOUNTER — Inpatient Hospital Stay: Payer: 59 | Attending: Internal Medicine

## 2023-11-02 ENCOUNTER — Inpatient Hospital Stay: Payer: 59 | Admitting: Oncology

## 2023-11-02 VITALS — BP 124/84 | HR 64 | Temp 97.1°F | Resp 18 | Ht 66.0 in | Wt 182.2 lb

## 2023-11-02 DIAGNOSIS — D6859 Other primary thrombophilia: Secondary | ICD-10-CM | POA: Insufficient documentation

## 2023-11-02 DIAGNOSIS — Z86718 Personal history of other venous thrombosis and embolism: Secondary | ICD-10-CM | POA: Diagnosis present

## 2023-11-02 DIAGNOSIS — Z7901 Long term (current) use of anticoagulants: Secondary | ICD-10-CM | POA: Insufficient documentation

## 2023-11-02 DIAGNOSIS — Z79899 Other long term (current) drug therapy: Secondary | ICD-10-CM | POA: Diagnosis not present

## 2023-11-02 DIAGNOSIS — D472 Monoclonal gammopathy: Secondary | ICD-10-CM

## 2023-11-02 LAB — CMP (CANCER CENTER ONLY)
ALT: 11 U/L (ref 0–44)
AST: 13 U/L — ABNORMAL LOW (ref 15–41)
Albumin: 4.2 g/dL (ref 3.5–5.0)
Alkaline Phosphatase: 49 U/L (ref 38–126)
Anion gap: 7 (ref 5–15)
BUN: 12 mg/dL (ref 6–20)
CO2: 24 mmol/L (ref 22–32)
Calcium: 8.9 mg/dL (ref 8.9–10.3)
Chloride: 104 mmol/L (ref 98–111)
Creatinine: 0.64 mg/dL (ref 0.44–1.00)
GFR, Estimated: >60 mL/min (ref >60)
Glucose, Bld: 88 mg/dL (ref 70–99)
Potassium: 4.3 mmol/L (ref 3.5–5.1)
Sodium: 135 mmol/L (ref 135–145)
Total Bilirubin: 1 mg/dL (ref 0.0–1.2)
Total Protein: 7.6 g/dL (ref 6.5–8.1)

## 2023-11-02 LAB — IRON AND TIBC
Iron: 58 ug/dL (ref 28–170)
Saturation Ratios: 19 % (ref 10.4–31.8)
TIBC: 311 ug/dL (ref 250–450)
UIBC: 253 ug/dL

## 2023-11-02 LAB — CBC WITH DIFFERENTIAL (CANCER CENTER ONLY)
Abs Immature Granulocytes: 0.03 10*3/uL (ref 0.00–0.07)
Basophils Absolute: 0.1 10*3/uL (ref 0.0–0.1)
Basophils Relative: 1 %
Eosinophils Absolute: 0.2 10*3/uL (ref 0.0–0.5)
Eosinophils Relative: 2 %
HCT: 37.8 % (ref 36.0–46.0)
Hemoglobin: 12.3 g/dL (ref 12.0–15.0)
Immature Granulocytes: 0 %
Lymphocytes Relative: 43 %
Lymphs Abs: 4.6 10*3/uL — ABNORMAL HIGH (ref 0.7–4.0)
MCH: 30.8 pg (ref 26.0–34.0)
MCHC: 32.5 g/dL (ref 30.0–36.0)
MCV: 94.5 fL (ref 80.0–100.0)
Monocytes Absolute: 1.1 10*3/uL — ABNORMAL HIGH (ref 0.1–1.0)
Monocytes Relative: 11 %
Neutro Abs: 4.5 10*3/uL (ref 1.7–7.7)
Neutrophils Relative %: 43 %
Platelet Count: 247 10*3/uL (ref 150–400)
RBC: 4 MIL/uL (ref 3.87–5.11)
RDW: 11.9 % (ref 11.5–15.5)
WBC Count: 10.5 10*3/uL (ref 4.0–10.5)
nRBC: 0 % (ref 0.0–0.2)

## 2023-11-02 LAB — FERRITIN: Ferritin: 94 ng/mL (ref 11–307)

## 2023-11-02 NOTE — Progress Notes (Signed)
 Hematology/Oncology Consult note [facility]  [phone] [phone]  Patient Care Team: Sowles, Krichna, MD as PCP - General (Family Medicine) Dasher, Margette Sheldon, MD (Dermatology) Reinhold Carbine, MD as Referring Physician (Specialist) Luke Salaam, MD as Consulting Physician (Gastroenterology) Randine Butcher, MD as Referring Physician (Psychiatry) Gwyn Leos, MD as Consulting Physician (Internal Medicine)   Name of the patient: Tammy Garza  [id number]  08-Apr-1981   Date of visit: 11/02/23  Diagnosis- history of left lower extremity DVT and protein C deficiency currently on Xarelto    Chief complaint/ Reason for visit-routine follow-up of DVT on Xarelto   Heme/Onc history: Patient is a 43 year old African-American female diagnosed with left lower extremity DVT in May 2018 following a 2-1/2-hour flight.  Prior to that she was on Depo-Provera  for a year.  Left lower extremity Doppler at that time showed acute appearing thrombus in the calf veins involving the posterior tibial and peroneal veins.  Patient was on Eliquis  since 2018 but was switched to Xarelto  starting January 2022 due to insurance issues.  Family history of DVT in her father and her daughter who subsequently died of a glioblastoma Hypercoagulable work-up on 09/26/2016 and 11/30/2016 revealed the following negative studies:  Factor V Leiden, prothrombin gene mutation, lupus anticoagulant panel, anticardiolipin antibodies, beta2-glycoprotein, protein S antigen/activity, and ATIII antigen/activity.  She has protein C deficiency.  Labs on 11/30/2016 revealed a protein C total of 40% (60 - 150%) and protein C activity of 49% (73-180%).  Repeat testing on 03/29/2017 revealed a protein C total of 51% and a protein C activity of 51%.   She has a history of MGUS when IgA monoclonal protein was found on immunofixation on SPEP back in May 2018 but was not subsequently detected on  myeloma panel on 2 separate occasions.    Interval history-tolerating Xarelto  well without any significant side effects.  She has not had any bleeding issues.  No recurrent episodes of thrombosis.  She reports having developed fine tremors when she does her job which requires fine motor movement.  She sees neurology for her history of migraines.  ECOG PS- 0 Pain scale- 0   Review of systems- Review of Systems  Constitutional:  Negative for chills, fever, malaise/fatigue and weight loss.  HENT:  Negative for congestion, ear discharge and nosebleeds.   Eyes:  Negative for blurred vision.  Respiratory:  Negative for cough, hemoptysis, sputum production, shortness of breath and wheezing.   Cardiovascular:  Negative for chest pain, palpitations, orthopnea and claudication.  Gastrointestinal:  Negative for abdominal pain, blood in stool, constipation, diarrhea, heartburn, melena, nausea and vomiting.  Genitourinary:  Negative for dysuria, flank pain, frequency, hematuria and urgency.  Musculoskeletal:  Negative for back pain, joint pain and myalgias.  Skin:  Negative for rash.  Neurological:  Negative for dizziness, tingling, focal weakness, seizures, weakness and headaches.  Endo/Heme/Allergies:  Does not bruise/bleed easily.  Psychiatric/Behavioral:  Negative for depression and suicidal ideas. The patient does not have insomnia.       Allergies  Allergen Reactions   Topamax [Topiramate] Other (See Comments)    Confusion , slurred speech    Effexor  [Venlafaxine ]     Gas and bloating      Past Medical History:  Diagnosis Date   Anemia    h/o   BRCA negative 01/2020   MyRisk neg except AXIN2 and RPS20 VUS   Cervical high risk human papillomavirus (HPV) DNA test positive    Chronic gastritis  Depression    DVT (deep venous thrombosis) (HCC) 09/2016   left leg   Family history of ovarian cancer    GERD (gastroesophageal reflux disease)    H. pylori infection    History of  attempted suicide    History of gastric polyp    History of kidney stones    h/o   Hyperhidrosis    Increased risk of breast cancer 01/2020   IBIS=20.5%   Migraines    Protein C deficiency (HCC)    Rash    Shift work sleep disorder      Past Surgical History:  Procedure Laterality Date   ABDOMINAL HYSTERECTOMY     ANTERIOR CRUCIATE LIGAMENT REPAIR Left    BREAST BIOPSY Left 10/07/2019   left breast calcs x clip, benign   CHOLECYSTECTOMY     CYSTOSCOPY  12/19/2016   Procedure: CYSTOSCOPY;  Surgeon: Alben Alma, MD;  Location: [facility] ORS;  Service: Gynecology;;   ESOPHAGOGASTRODUODENOSCOPY (EGD) WITH PROPOFOL  N/A 09/25/2017   Procedure: ESOPHAGOGASTRODUODENOSCOPY (EGD) WITH PROPOFOL ;  Surgeon: Luke Salaam, MD;  Location: [facility] ENDOSCOPY;  Service: Gastroenterology;  Laterality: N/A;   LAPAROSCOPIC HYSTERECTOMY Bilateral 12/19/2016   Procedure: HYSTERECTOMY TOTAL LAPAROSCOPIC BILATERAL SALPINGECTOMY;  Surgeon: Alben Alma, MD;  Location: [facility] ORS;  Service: Gynecology;  Laterality: Bilateral;   OTHER SURGICAL HISTORY     Knee surgery, 8/23 and 3/24   PLANTAR FASCIA SURGERY  06/2019   RECTAL EXAM UNDER ANESTHESIA N/A 02/26/2019   Procedure: RECTAL EXAM UNDER ANESTHESIA, EXCISION OF ANAL SKIN TAG;  Surgeon: Mercy Stall, MD;  Location: [facility] ORS;  Service: General;  Laterality: N/A;   TUBAL LIGATION      Social History   Socioeconomic History   Marital status: Divorced    Spouse name: Not on file   Number of children: 2   Years of education: 13   Highest education level: Associate degree: occupational, Scientist, product/process development, or vocational program  Occupational History   Occupation: Location manager   Tobacco Use   Smoking status: Former    Current packs/day: 0.00    Average packs/day: 0.3 packs/day for 5.0 years (1.3 ttl pk-yrs)    Types: Cigarettes    Start date: 05/15/2002    Quit date: 05/16/2007    Years since quitting: 16.4   Smokeless tobacco: Never  Vaping Use    Vaping status: Never Used  Substance and Sexual Activity   Alcohol use: Yes    Alcohol/week: 1.0 standard drink of alcohol    Types: 1 Glasses of wine per week    Comment: occ wine none last 24 hrs   Drug use: Not Currently   Sexual activity: Yes    Partners: Male    Birth control/protection: Surgical    Comment: hysterectomy   Other Topics Concern   Not on file  Social History Narrative   Separated since June 2016 - her youngest daughter 68 yo died of complications of glioblastoma, has an older daughter that works.     Engaged to [employer]: Low Risk  (02/19/2023)   Overall Financial Resource Strain (CARDIA)    Difficulty of Paying Living Expenses: Not hard at all  Food Insecurity: No Food Insecurity (02/19/2023)   Hunger Vital Sign    Worried About Running Out of Food in the Last Year: Never true    Ran Out of Food in the Last Year: Never true  Transportation Needs: No Transportation Needs (02/19/2023)  PRAPARE - Administrator, Civil Service (Medical): No    Lack of Transportation (Non-Medical): No  Physical Activity: Insufficiently Active (02/19/2023)   Exercise Vital Sign    Days of Exercise per Week: 1 day    Minutes of Exercise per Session: 30 min  Stress: Stress Concern Present (02/19/2023)   [employer] of Occupational Health - Occupational Stress Questionnaire    Feeling of Stress : To some extent  Social Connections: Socially Isolated (02/19/2023)   Social Connection and Isolation Panel    Frequency of Communication with Friends and Family: More than three times a week    Frequency of Social Gatherings with Friends and Family: Once a week    Attends Religious Services: Never    Database administrator or Organizations: No    Attends Banker Meetings: Never    Marital Status: Never married  Catering manager Violence: Not At Risk (02/19/2023)   Humiliation, Afraid, Rape, and Kick  questionnaire    Fear of Current or Ex-Partner: No    Emotionally Abused: No    Physically Abused: No    Sexually Abused: No    Family History  Problem Relation Age of Onset   Cancer Mother        multiple myeloma   Hypertension Mother    Obesity Mother    Migraines Mother    Heart failure Father    Cancer Father 6       lung cancer   Deep vein thrombosis Father    Protein C deficiency Father    COPD Father    Congestive Heart Failure Father    Cancer Sister        cervical   HIV Sister    Ovarian cancer Sister 19   Migraines Sister    Diabetes Brother    Hypertension Maternal Grandmother    COPD Paternal Grandmother    Deep vein thrombosis Paternal Grandmother    Asthma Daughter    Protein C deficiency Daughter    Brain cancer Daughter    Cancer Daughter    Migraines Daughter    Protein C deficiency Daughter    Migraines Daughter    Breast cancer Neg Hx      Current Outpatient Medications:    Boric Acid CRYS, Place 600 mg vaginally 2 (two) times a week., Disp: 500 g, Rfl: 3   clindamycin  (CLEOCIN  T) 1 % lotion, Apply topically every morning., Disp: , Rfl:    clobetasol ointment (TEMOVATE) 0.05 %, Apply topically 2 (two) times daily., Disp: , Rfl:    Fremanezumab -vfrm (AJOVY ) 225 MG/1.5ML SOAJ, INJECT 225 MG INTO THE SKIN EVERY 30 (THIRTY) DAYS., Disp: 4.5 mL, Rfl: 2   glycopyrrolate  (ROBINUL ) 1 MG tablet, Take 3 mg by mouth daily., Disp: , Rfl:    hydrOXYzine  (ATARAX ) 10 MG tablet, SMARTSIG:1 Pill By Mouth Every Night PRN, Disp: , Rfl:    NURTEC 75 MG TBDP, DISSOLVE 1 TABLET BY MOUTH DAILY AS NEEDED., Disp: 8 tablet, Rfl: 7   rivaroxaban  (XARELTO ) 20 MG TABS tablet, Take 1 tablet (20 mg total) by mouth daily., Disp: 90 tablet, Rfl: 3   traZODone  (DESYREL ) 100 MG tablet, Take 100 mg by mouth at bedtime., Disp: , Rfl:    tretinoin  (RETIN-A ) 0.025 % cream, Apply topically at bedtime., Disp: 45 g, Rfl: 0   Vitamin D , Ergocalciferol , (DRISDOL ) 1.25 MG (50000 UNIT)  CAPS capsule, TAKE 1 CAPSULE (50,000 UNITS TOTAL) BY MOUTH EVERY 7 (SEVEN) DAYS, Disp: 12  capsule, Rfl: 1  Physical exam:  Vitals:   11/02/23 1259  Height: 5' 6 (1.676 m)   Physical Exam  Cardiovascular:     Rate and Rhythm: Normal rate and regular rhythm.     Heart sounds: Normal heart sounds.  Pulmonary:     Effort: Pulmonary effort is normal.     Breath sounds: Normal breath sounds.  Abdominal:     General: Bowel sounds are normal.   Skin:    General: Skin is warm and dry.   Neurological:     Mental Status: She is alert and oriented to person, place, and time.      I have personally reviewed labs listed below:    Latest Ref Rng & Units 02/19/2023    2:57 PM  CMP  Glucose 65 - 99 mg/dL 86   BUN 7 - 25 mg/dL 12   Creatinine 4.09 - 0.99 mg/dL 8.11   Sodium 914 - 782 mmol/L 140   Potassium 3.5 - 5.3 mmol/L 4.9   Chloride 98 - 110 mmol/L 107   CO2 20 - 32 mmol/L 25   Calcium  8.6 - 10.2 mg/dL 9.4   Total Protein 6.1 - 8.1 g/dL 7.3   Total Bilirubin 0.2 - 1.2 mg/dL 0.4   AST 10 - 30 U/L 13   ALT 6 - 29 U/L 11       Latest Ref Rng & Units 02/19/2023    2:57 PM  CBC  WBC 3.8 - 10.8 Thousand/uL 6.6   Hemoglobin 11.7 - 15.5 g/dL 95.6   Hematocrit 21.3 - 45.0 % 40.8   Platelets 140 - 400 Thousand/uL 294    I have personally reviewed Radiology images listed below: No images are attached to the encounter.  MM 3D DIAGNOSTIC MAMMOGRAM UNILATERAL RIGHT BREAST Result Date: 10/19/2023 CLINICAL DATA:  Screening recall for possible RIGHT breast asymmetry. EXAM: DIGITAL DIAGNOSTIC UNILATERAL RIGHT MAMMOGRAM WITH TOMOSYNTHESIS AND CAD TECHNIQUE: Right digital diagnostic mammography and breast tomosynthesis was performed. The images were evaluated with computer-aided detection. COMPARISON:  Previous exam(s). ACR Breast Density Category c: The breasts are heterogeneously dense, which may obscure small masses. FINDINGS: The previously described asymmetry does not persist with  additional views, consistent with superimposed fibroglandular tissue. No suspicious mass, microcalcification, or other finding is identified. IMPRESSION: No mammographic evidence of malignancy in the RIGHT breast. RECOMMENDATION: Return to routine screening mammography is recommended. The patient will be due for screening in May 2026. I have discussed the findings and recommendations with the patient. If applicable, a reminder letter will be sent to the patient regarding the next appointment. BI-RADS CATEGORY  1: Negative. Electronically Signed   By: Sande Cromer M.D.   On: 10/19/2023 14:47   MM 3D SCREENING MAMMOGRAM BILATERAL BREAST Result Date: 10/11/2023 CLINICAL DATA:  Screening. EXAM: DIGITAL SCREENING BILATERAL MAMMOGRAM WITH TOMOSYNTHESIS AND CAD TECHNIQUE: Bilateral screening digital craniocaudal and mediolateral oblique mammograms were obtained. Bilateral screening digital breast tomosynthesis was performed. The images were evaluated with computer-aided detection. COMPARISON:  Previous exam(s). ACR Breast Density Category c: The breasts are heterogeneously dense, which may obscure small masses. FINDINGS: In the right breast, possible distortion warrants further evaluation. In the left breast, no findings suspicious for malignancy. IMPRESSION: Further evaluation is suggested for possible distortion in the right breast. RECOMMENDATION: Diagnostic mammogram and possibly ultrasound of the right breast. (Code:[id number]) The patient will be contacted regarding the findings, and additional imaging will be scheduled. BI-RADS CATEGORY  0: Incomplete: Need additional imaging evaluation. Electronically Signed  By: Meghana  Konanur M.D.   On: 10/11/2023 10:52     Assessment and plan- Patient is a 43 y.o. female with history of protein C deficiency and prior history of left lower extremity DVT currently on Xarelto  here for routine follow-up  Patient is tolerating Xarelto  well without any significant  bleeding complications.  No recurrent episodes of thrombosis on Xarelto .  Given that she had protein see deficiency she will continue with anticoagulation lifelong and can follow-up with her primary care Dr. Ava Lei at this time.  Patient has a history of IgA MGUS which was found on immunofixation back in May 2018.  Subsequently this has been checked for 6 consecutive years and there was no evidence of M protein seen.  She does not require any further monitoring for this   Visit Diagnosis 1. Current use of long term anticoagulation      Dr. Seretha Dance, MD, MPH [facility] [phone] 11/02/2023 1:10 PM

## 2023-11-02 NOTE — Progress Notes (Signed)
 Patient would like to know if her history of protein c&s could be affecting her bones. She states she is having some chronic bone pain, when the pain is present, she rates the pain a level 10 on a pain scale from 1-10.

## 2023-11-10 ENCOUNTER — Other Ambulatory Visit: Payer: Self-pay | Admitting: Family Medicine

## 2023-11-10 DIAGNOSIS — E559 Vitamin D deficiency, unspecified: Secondary | ICD-10-CM

## 2023-11-14 ENCOUNTER — Telehealth: Payer: Self-pay

## 2023-11-14 NOTE — Telephone Encounter (Signed)
 Pt needs an appt

## 2023-11-14 NOTE — Telephone Encounter (Signed)
 Copied from CRM [id number]. Topic: Referral - Request for Referral >> Nov 14, 2023 10:52 AM Tiffini S wrote: Did the patient discuss referral with their provider in the last year? Yes (If No - schedule appointment) (If Yes - send message)  Appointment offered? Yes, patient is asking for the referral/ have a follow up appointment scheduled   Type of order/referral and detailed reason for visit: Neurology (Urgent referral) with:  Tammy Russell NP [facility] [address], [address] [place], [place] [address] [phone] Fax: [phone]  Patient needs to be seen for: Brain Fog, Blurry Vision Changes, Right Hand trebling  Preference of office, provider, location: Person In   If referral order, have you been seen by this specialty before? No (If Yes, this issue or another issue? When? Where?  Can we respond through MyChart? Yes

## 2023-11-14 NOTE — Telephone Encounter (Signed)
Lvm for pt to call back to schedule an appt  

## 2023-11-15 ENCOUNTER — Ambulatory Visit: Payer: Self-pay

## 2023-11-15 NOTE — Telephone Encounter (Signed)
 Copied from CRM [id number]. Topic: Appointments - Appointment Scheduling >> Nov 15, 2023  9:47 AM Winona SAUNDERS wrote: Worst symptoms of Blurred vision and hand tremors. Patient calling to schedule a appointment to see a Neurologist   FYI Only or Action Required?: FYI only for provider.  Patient was last seen in primary care on 05/21/2023 by Wellington Curtis LABOR, FNP. Called Nurse Triage reporting Fatigue. Symptoms began about a month ago. Symptoms are: gradually worsening.  Triage Disposition: See PCP When Office is Open (Within 3 Days)  Patient/caregiver understands and will follow disposition?: Yes   Reason for Disposition  [1] Fatigue (i.e., tires easily, decreased energy) AND [2] persists > 1 week  Answer Assessment - Initial Assessment Questions 1. DESCRIPTION: Describe how you are feeling.     Tired and bran fog 2. SEVERITY: How bad is it?  Can you stand and walk?   - MILD (0-3): Feels weak or tired, but does not interfere with work, school or normal activities.   - MODERATE (4-7): Able to stand and walk; weakness interferes with work, school, or normal activities.   - SEVERE (8-10): Unable to stand or walk; unable to do usual activities.     Mild 3. ONSET: When did these symptoms begin? (e.g., hours, days, weeks, months)     1 month ago 4. CAUSE: What do you think is causing the weakness or fatigue? (e.g., not drinking enough fluids, medical problem, trouble sleeping)     Unsure  5. NEW MEDICINES:  Have you started on any new medicines recently? (e.g., opioid pain medicines, benzodiazepines, muscle relaxants, antidepressants, antihistamines, neuroleptics, beta blockers)     No 6. OTHER SYMPTOMS: Do you have any other symptoms? (e.g., chest pain, fever, cough, SOB, vomiting, diarrhea, bleeding, other areas of pain)     Intermittent blurred vision, hand trembling,  7. PREGNANCY: Is there any chance you are pregnant? When was your last menstrual period?     No  Protocols  used: Weakness (Generalized) and Fatigue-A-AH

## 2023-11-21 ENCOUNTER — Ambulatory Visit (INDEPENDENT_AMBULATORY_CARE_PROVIDER_SITE_OTHER): Admitting: Nurse Practitioner

## 2023-11-21 ENCOUNTER — Encounter: Payer: Self-pay | Admitting: Nurse Practitioner

## 2023-11-21 VITALS — BP 126/78 | HR 100 | Temp 97.9°F | Resp 18 | Ht 66.0 in | Wt 183.0 lb

## 2023-11-21 DIAGNOSIS — R4189 Other symptoms and signs involving cognitive functions and awareness: Secondary | ICD-10-CM | POA: Diagnosis not present

## 2023-11-21 DIAGNOSIS — R5383 Other fatigue: Secondary | ICD-10-CM | POA: Diagnosis not present

## 2023-11-21 DIAGNOSIS — H539 Unspecified visual disturbance: Secondary | ICD-10-CM | POA: Diagnosis not present

## 2023-11-21 NOTE — Progress Notes (Signed)
 BP 126/78   Pulse 100   Temp 97.9 F (36.6 C)   Resp 18   Ht 5' 6 (1.676 m)   Wt 183 lb (83 kg)   LMP 01/04/2016 Comment: partial hysterectomy, tubal   SpO2 98%   BMI 29.54 kg/m    Subjective:    Patient ID: Tammy Garza, female    DOB: 1980/10/16, 43 y.o.   MRN: [id number]  HPI: Tammy Garza is a 43 y.o. female  Chief Complaint  Patient presents with   Referral    Neuro due to brain fog, hand termor and blurred vision onset 1 month    Discussed the use of AI scribe software for clinical note transcription with the patient, who gave verbal consent to proceed.  History of Present Illness Tammy Garza is a 43 year old female who presents with worsening vision changes and right hand tremor.  She has been experiencing progressive vision changes since January, with constant blurriness more pronounced in the right eye. Despite wearing glasses, including bifocals, her vision has deteriorated even after a prescription change last year.  Approximately a month and a half ago, she developed a tremor in her right hand, noticeable during tasks requiring fine motor skills, such as applying mascara or measuring chemicals at work, though it is not constant.  She experiences fatigue and brain fog, persistent since the onset of her vision changes. No chest pain or palpitations. She occasionally snores when very tired and uses hydroxyzine  to aid sleep, although she generally feels rested upon waking.  Her past medical history includes migraines, for which she has previously consulted neurology. A CT scan of the head and an MRI in 2019 were normal. Recent blood work, including a metabolic panel and syphilis test, showed no abnormalities.  Family history is significant for her daughter who passed away from glioblastoma three years ago. There is no family history of similar symptoms in her parents.         05/21/2023   10:52 AM 02/19/2023    2:05 PM 04/12/2022   10:25 AM  Depression  screen PHQ 2/9  Decreased Interest 2 2 2   Down, Depressed, Hopeless 1 2 1   PHQ - 2 Score 3 4 3   Altered sleeping 3 0 2  Tired, decreased energy 3 0 3  Change in appetite 3 0 1  Feeling bad or failure about yourself  0 0 0  Trouble concentrating 3 0 1  Moving slowly or fidgety/restless 1 0 0  Suicidal thoughts 0 0 0  PHQ-9 Score 16 4 10   Difficult doing work/chores Extremely dIfficult  Very difficult    Relevant past medical, surgical, family and social history reviewed and updated as indicated. Interim medical history since our last visit reviewed. Allergies and medications reviewed and updated.  Review of Systems  Ten systems reviewed and is negative except as mentioned in HPI      Objective:     BP 126/78   Pulse 100   Temp 97.9 F (36.6 C)   Resp 18   Ht 5' 6 (1.676 m)   Wt 183 lb (83 kg)   LMP 01/04/2016 Comment: partial hysterectomy, tubal   SpO2 98%   BMI 29.54 kg/m    Wt Readings from Last 3 Encounters:  11/21/23 183 lb (83 kg)  11/02/23 182 lb 3.2 oz (82.6 kg)  10/18/23 182 lb (82.6 kg)    Physical Exam Physical Exam GENERAL: Alert, cooperative, well developed, no acute distress. HEENT: Normocephalic, normal oropharynx,  moist mucous membranes. CHEST: Clear to auscultation bilaterally, no wheezes, rhonchi, or crackles. CARDIOVASCULAR: Normal heart rate and rhythm, S1 and S2 normal without murmurs. ABDOMEN: Soft, non-tender, non-distended, without organomegaly, normal bowel sounds. EXTREMITIES: No cyanosis or edema. NEUROLOGICAL: Cranial nerves grossly intact, moves all extremities without gross motor or sensory deficit, 5/5 strength in all extremities, reflexes present, negative Babinski response, sensation symmetric and intact in upper and lower extremities, heel to toe intact, finger to nose intact, grip strength intact.   Results for orders placed or performed in visit on 11/02/23  Iron and TIBC   Collection Time: 11/02/23  1:01 PM  Result Value Ref  Range   Iron 58 28 - 170 ug/dL   TIBC 688 749 - 549 ug/dL   Saturation Ratios 19 10.4 - 31.8 %   UIBC 253 ug/dL  Ferritin   Collection Time: 11/02/23  1:01 PM  Result Value Ref Range   Ferritin 94 11 - 307 ng/mL  CMP (Cancer Center only)   Collection Time: 11/02/23  1:01 PM  Result Value Ref Range   Sodium 135 135 - 145 mmol/L   Potassium 4.3 3.5 - 5.1 mmol/L   Chloride 104 98 - 111 mmol/L   CO2 24 22 - 32 mmol/L   Glucose, Bld 88 70 - 99 mg/dL   BUN 12 6 - 20 mg/dL   Creatinine 9.35 9.55 - 1.00 mg/dL   Calcium  8.9 8.9 - 10.3 mg/dL   Total Protein 7.6 6.5 - 8.1 g/dL   Albumin 4.2 3.5 - 5.0 g/dL   AST 13 (L) 15 - 41 U/L   ALT 11 0 - 44 U/L   Alkaline Phosphatase 49 38 - 126 U/L   Total Bilirubin 1.0 0.0 - 1.2 mg/dL   GFR, Estimated >39 >39 mL/min   Anion gap 7 5 - 15  CBC with Differential (Cancer Center Only)   Collection Time: 11/02/23  1:01 PM  Result Value Ref Range   WBC Count 10.5 4.0 - 10.5 K/uL   RBC 4.00 3.87 - 5.11 MIL/uL   Hemoglobin 12.3 12.0 - 15.0 g/dL   HCT 62.1 63.9 - 53.9 %   MCV 94.5 80.0 - 100.0 fL   MCH 30.8 26.0 - 34.0 pg   MCHC 32.5 30.0 - 36.0 g/dL   RDW 88.0 88.4 - 84.4 %   Platelet Count 247 150 - 400 K/uL   nRBC 0.0 0.0 - 0.2 %   Neutrophils Relative % 43 %   Neutro Abs 4.5 1.7 - 7.7 K/uL   Lymphocytes Relative 43 %   Lymphs Abs 4.6 (H) 0.7 - 4.0 K/uL   Monocytes Relative 11 %   Monocytes Absolute 1.1 (H) 0.1 - 1.0 K/uL   Eosinophils Relative 2 %   Eosinophils Absolute 0.2 0.0 - 0.5 K/uL   Basophils Relative 1 %   Basophils Absolute 0.1 0.0 - 0.1 K/uL   Immature Granulocytes 0 %   Abs Immature Granulocytes 0.03 0.00 - 0.07 K/uL   Last CBC Lab Results  Component Value Date   WBC 10.5 11/02/2023   HGB 12.3 11/02/2023   HCT 37.8 11/02/2023   MCV 94.5 11/02/2023   MCH 30.8 11/02/2023   RDW 11.9 11/02/2023   PLT 247 11/02/2023   Last metabolic panel Lab Results  Component Value Date   GLUCOSE 88 11/02/2023   NA 135 11/02/2023    K 4.3 11/02/2023   CL 104 11/02/2023   CO2 24 11/02/2023   BUN 12 11/02/2023  CREATININE 0.64 11/02/2023   GFRNONAA >60 11/02/2023   CALCIUM  8.9 11/02/2023   PROT 7.6 11/02/2023   ALBUMIN 4.2 11/02/2023   LABGLOB 2.8 10/27/2022   AGRATIO 1.4 10/27/2022   BILITOT 1.0 11/02/2023   ALKPHOS 49 11/02/2023   AST 13 (L) 11/02/2023   ALT 11 11/02/2023   ANIONGAP 7 11/02/2023           Assessment & Plan:   Problem List Items Addressed This Visit       Other   Vision changes   Relevant Orders   B12 and Folate Panel   Sedimentation rate   C-reactive protein   TSH   VITAMIN D  25 Hydroxy (Vit-D Deficiency, Fractures)   MR BRAIN WO CONTRAST   Analyzer ANA IFA w/RFLX Titer/Pattern,Systemic Autoimmune Panel 1   Ambulatory referral to Neurology   Other Visit Diagnoses       Other fatigue    -  Primary   Relevant Orders   B12 and Folate Panel   Sedimentation rate   C-reactive protein   TSH   VITAMIN D  25 Hydroxy (Vit-D Deficiency, Fractures)   MR BRAIN WO CONTRAST   Analyzer ANA IFA w/RFLX Titer/Pattern,Systemic Autoimmune Panel 1   Ambulatory referral to Neurology     Brain fog       Relevant Orders   B12 and Folate Panel   Sedimentation rate   C-reactive protein   TSH   VITAMIN D  25 Hydroxy (Vit-D Deficiency, Fractures)   MR BRAIN WO CONTRAST   Analyzer ANA IFA w/RFLX Titer/Pattern,Systemic Autoimmune Panel 1   Ambulatory referral to Neurology        Assessment and Plan Assessment & Plan Vision changes, worse in right eye Chronic vision changes since January, worsening in the right eye. Previous CT scan unremarkable. Differential diagnosis includes neurological causes, vitamin deficiencies, or autoimmune conditions. Family history of glioblastoma noted, but no direct family history of similar symptoms. - Order MRI of the brain - Check B12 and folate levels - Check thyroid  function - Order autoimmune panel including tests for lupus, Sjogren's syndrome, and  rheumatoid arthritis - Refer to neurology for further evaluation  Right hand tremor Tremor in the right hand, particularly noticeable during tasks requiring fine motor skills. Symptoms began approximately a month and a half ago. Differential diagnosis includes neurological disorders or vitamin deficiencies. - Order MRI of the brain - Check B12 and folate levels - Refer to neurology for further evaluation  Fatigue and brain fog Persistent fatigue and brain fog since January. Previous metabolic panel and blood counts were normal. Differential diagnosis includes vitamin deficiencies, thyroid  dysfunction, or autoimmune conditions. - Check B12 and folate levels - Check thyroid  function - Order autoimmune panel including tests for lupus, Sjogren's syndrome, and rheumatoid arthritis    Recently had cbc and cmp done.     Follow up plan: Return if symptoms worsen or fail to improve.

## 2023-11-22 ENCOUNTER — Ambulatory Visit: Payer: Self-pay | Admitting: Nurse Practitioner

## 2023-11-22 DIAGNOSIS — R4189 Other symptoms and signs involving cognitive functions and awareness: Secondary | ICD-10-CM

## 2023-11-22 DIAGNOSIS — M2550 Pain in unspecified joint: Secondary | ICD-10-CM

## 2023-11-22 DIAGNOSIS — R5383 Other fatigue: Secondary | ICD-10-CM

## 2023-11-22 DIAGNOSIS — R899 Unspecified abnormal finding in specimens from other organs, systems and tissues: Secondary | ICD-10-CM

## 2023-11-23 ENCOUNTER — Ambulatory Visit
Admission: RE | Admit: 2023-11-23 | Discharge: 2023-11-23 | Disposition: A | Discharge status: Home / Self Care | Source: Ambulatory Visit | Attending: Nurse Practitioner

## 2023-11-23 DIAGNOSIS — R4189 Other symptoms and signs involving cognitive functions and awareness: Secondary | ICD-10-CM | POA: Diagnosis present

## 2023-11-23 DIAGNOSIS — R5383 Other fatigue: Secondary | ICD-10-CM | POA: Diagnosis present

## 2023-11-23 DIAGNOSIS — H539 Unspecified visual disturbance: Secondary | ICD-10-CM | POA: Diagnosis present

## 2023-11-27 ENCOUNTER — Other Ambulatory Visit: Payer: Self-pay | Admitting: Medical Genetics

## 2023-11-27 LAB — ANALYZER(R)ANA IFA WITH REFLEX TITER/PATTRN,SYS AUTOIMM PNL1
Anti Nuclear Antibody (ANA): NEGATIVE
Anticardiolipin IgA: <2 [APL'U]/mL
Anticardiolipin IgG: <2 [GPL'U]/mL
Anticardiolipin IgM: <2 [MPL'U]/mL
Beta-2 Glyco 1 IgA: <2 U/mL
Beta-2 Glyco 1 IgM: <2 U/mL
Beta-2 Glyco I IgG: <2 U/mL
C3 Complement: 125 mg/dL (ref 83–193)
C4 Complement: 15 mg/dL (ref 15–57)
Centromere Ab Screen: <1 AI
Chromatin (Nucleosomal) Antibody: <1 AI
Cyclic Citrullin Peptide Ab: <16 U
DNA Ab (DS) Crithidia, IFA: NEGATIVE
ENA SM Ab Ser-aCnc: <1 AI
Jo-1 Autoabs: <1 AI
MUTATED CITRULLINATED VIMENTIN (MCV) AB: <20 U/mL (ref <20)
Rheumatoid Factor (IgA): 18 U — ABNORMAL HIGH
Rheumatoid Factor (IgG): <5 U
Rheumatoid Factor (IgM): 6 U
Ribonucleic Protein(ENA) Antibody, IgG: >8 AI — ABNORMAL HIGH
SM/RNP: <1 AI
SSA (Ro) (ENA) Antibody, IgG: <1 AI
SSB (La) (ENA) Antibody, IgG: <1 AI
Scleroderma (Scl-70) (ENA) Antibody, IgG: <1 AI
Thyroperoxidase Ab SerPl-aCnc: <1 [IU]/mL (ref <9)

## 2023-11-27 LAB — B12 AND FOLATE PANEL
Folate: 10.4 ng/mL
Vitamin B-12: 460 pg/mL (ref 200–1100)

## 2023-11-27 LAB — TSH: TSH: 2.76 m[IU]/L

## 2023-11-27 LAB — VITAMIN D 25 HYDROXY (VIT D DEFICIENCY, FRACTURES): Vit D, 25-Hydroxy: 80 ng/mL (ref 30–100)

## 2023-11-27 LAB — SEDIMENTATION RATE: Sed Rate: 6 mm/h (ref 0–20)

## 2023-11-27 LAB — C-REACTIVE PROTEIN: CRP: <3 mg/L (ref <8.0)

## 2023-12-03 ENCOUNTER — Other Ambulatory Visit

## 2023-12-10 ENCOUNTER — Other Ambulatory Visit

## 2024-01-12 ENCOUNTER — Other Ambulatory Visit
Admission: RE | Admit: 2024-01-12 | Discharge: 2024-01-12 | Disposition: A | Discharge status: Home / Self Care | Payer: Self-pay | Source: Ambulatory Visit | Attending: Medical Genetics | Admitting: Medical Genetics

## 2024-01-21 LAB — GENECONNECT MOLECULAR SCREEN: Genetic Analysis Overall Interpretation: NEGATIVE

## 2024-02-09 ENCOUNTER — Other Ambulatory Visit: Payer: Self-pay

## 2024-02-19 NOTE — Patient Instructions (Signed)
 Preventive Care 58-44 Years Old, Female  Preventive care refers to lifestyle choices and visits with your health care provider that can promote health and wellness. Preventive care visits are also called wellness exams.  What can I expect for my preventive care visit?  Counseling  Your health care provider may ask you questions about your:  Medical history, including:  Past medical problems.  Family medical history.  Pregnancy history.  Current health, including:  Menstrual cycle.  Method of birth control.  Emotional well-being.  Home life and relationship well-being.  Sexual activity and sexual health.  Lifestyle, including:  Alcohol, nicotine or tobacco, and drug use.  Access to firearms.  Diet, exercise, and sleep habits.  Work and work Astronomer.  Sunscreen use.  Safety issues such as seatbelt and bike helmet use.  Physical exam  Your health care provider will check your:  Height and weight. These may be used to calculate your BMI (body mass index). BMI is a measurement that tells if you are at a healthy weight.  Waist circumference. This measures the distance around your waistline. This measurement also tells if you are at a healthy weight and may help predict your risk of certain diseases, such as type 2 diabetes and high blood pressure.  Heart rate and blood pressure.  Body temperature.  Skin for abnormal spots.  What immunizations do I need?    Vaccines are usually given at various ages, according to a schedule. Your health care provider will recommend vaccines for you based on your age, medical history, and lifestyle or other factors, such as travel or where you work.  What tests do I need?  Screening  Your health care provider may recommend screening tests for certain conditions. This may include:  Lipid and cholesterol levels.  Diabetes screening. This is done by checking your blood sugar (glucose) after you have not eaten for a while (fasting).  Pelvic exam and Pap test.  Hepatitis B test.  Hepatitis C  test.  HIV (human immunodeficiency virus) test.  STI (sexually transmitted infection) testing, if you are at risk.  Lung cancer screening.  Colorectal cancer screening.  Mammogram. Talk with your health care provider about when you should start having regular mammograms. This may depend on whether you have a family history of breast cancer.  BRCA-related cancer screening. This may be done if you have a family history of breast, ovarian, tubal, or peritoneal cancers.  Bone density scan. This is done to screen for osteoporosis.  Talk with your health care provider about your test results, treatment options, and if necessary, the need for more tests.  Follow these instructions at home:  Eating and drinking    Eat a diet that includes fresh fruits and vegetables, whole grains, lean protein, and low-fat dairy products.  Take vitamin and mineral supplements as recommended by your health care provider.  Do not drink alcohol if:  Your health care provider tells you not to drink.  You are pregnant, may be pregnant, or are planning to become pregnant.  If you drink alcohol:  Limit how much you have to 0-1 drink a day.  Know how much alcohol is in your drink. In the [place] one drink equals one 12 oz bottle of beer (355 mL), one 5 oz glass of wine (148 mL), or one 1 oz glass of hard liquor (44 mL).  Lifestyle  Brush your teeth every morning and night with fluoride toothpaste. Floss one time each day.  Exercise for at least  30 minutes 5 or more days each week.  Do not use any products that contain nicotine or tobacco. These products include cigarettes, chewing tobacco, and vaping devices, such as e-cigarettes. If you need help quitting, ask your health care provider.  Do not use drugs.  If you are sexually active, practice safe sex. Use a condom or other form of protection to prevent STIs.  If you do not wish to become pregnant, use a form of birth control. If you plan to become pregnant, see your health care provider for a  prepregnancy visit.  Take aspirin only as told by your health care provider. Make sure that you understand how much to take and what form to take. Work with your health care provider to find out whether it is safe and beneficial for you to take aspirin daily.  Find healthy ways to manage stress, such as:  Meditation, yoga, or listening to music.  Journaling.  Talking to a trusted person.  Spending time with friends and family.  Minimize exposure to UV radiation to reduce your risk of skin cancer.  Safety  Always wear your seat belt while driving or riding in a vehicle.  Do not drive:  If you have been drinking alcohol. Do not ride with someone who has been drinking.  When you are tired or distracted.  While texting.  If you have been using any mind-altering substances or drugs.  Wear a helmet and other protective equipment during sports activities.  If you have firearms in your house, make sure you follow all gun safety procedures.  Seek help if you have been physically or sexually abused.  What's next?  Visit your health care provider once a year for an annual wellness visit.  Ask your health care provider how often you should have your eyes and teeth checked.  Stay up to date on all vaccines.  This information is not intended to replace advice given to you by your health care provider. Make sure you discuss any questions you have with your health care provider.  Document Revised: 10/27/2020 Document Reviewed: 10/27/2020  Elsevier Patient Education  2024 [employer].

## 2024-02-20 ENCOUNTER — Ambulatory Visit (INDEPENDENT_AMBULATORY_CARE_PROVIDER_SITE_OTHER): Admitting: Family Medicine

## 2024-02-20 ENCOUNTER — Encounter: Payer: Self-pay | Admitting: Family Medicine

## 2024-02-20 ENCOUNTER — Other Ambulatory Visit (HOSPITAL_COMMUNITY)
Admission: RE | Admit: 2024-02-20 | Discharge: 2024-02-20 | Disposition: A | Discharge status: Home / Self Care | Source: Ambulatory Visit | Attending: Family Medicine | Admitting: Family Medicine

## 2024-02-20 VITALS — BP 118/70 | HR 62 | Temp 98.1°F | Resp 18 | Ht 66.0 in | Wt 181.9 lb

## 2024-02-20 DIAGNOSIS — Z0000 Encounter for general adult medical examination without abnormal findings: Secondary | ICD-10-CM | POA: Insufficient documentation

## 2024-02-20 DIAGNOSIS — Z113 Encounter for screening for infections with a predominantly sexual mode of transmission: Secondary | ICD-10-CM | POA: Insufficient documentation

## 2024-02-20 DIAGNOSIS — Z1159 Encounter for screening for other viral diseases: Secondary | ICD-10-CM | POA: Diagnosis not present

## 2024-02-20 DIAGNOSIS — E785 Hyperlipidemia, unspecified: Secondary | ICD-10-CM

## 2024-02-20 DIAGNOSIS — L4050 Arthropathic psoriasis, unspecified: Secondary | ICD-10-CM | POA: Insufficient documentation

## 2024-02-20 DIAGNOSIS — Z131 Encounter for screening for diabetes mellitus: Secondary | ICD-10-CM | POA: Diagnosis not present

## 2024-02-20 DIAGNOSIS — Z1211 Encounter for screening for malignant neoplasm of colon: Secondary | ICD-10-CM

## 2024-02-20 NOTE — Progress Notes (Signed)
 Name: Tammy Garza   MRN: [id number]    DOB: 1980/05/28   Date:02/20/2024       Progress Note  Subjective  Chief Complaint  Chief Complaint  Patient presents with   Annual Exam    HPI  Patient presents for annual CPE.  Diet: cooks at home twice a week, otherwise eats out at [employer] , however she likes fruit and less red meat and more plant base diet at home  Exercise: walking on treadmill at home and stretches   Last Eye Exam: completed Last Dental Exam: completed  Flowsheet Row Office Visit from 11/21/2023 in [facility]  AUDIT-C Score 2    Depression: Phq 9 is  negative    02/20/2024   11:12 AM 05/21/2023   10:52 AM 02/19/2023    2:05 PM 04/12/2022   10:25 AM 02/08/2022   10:14 AM  Depression screen PHQ 2/9  Decreased Interest 0 2 2 2 2   Down, Depressed, Hopeless 0 1 2 1  0  PHQ - 2 Score 0 3 4 3 2   Altered sleeping 0 3 0 2 3  Tired, decreased energy 0 3 0 3 1  Change in appetite 0 3 0 1 1  Feeling bad or failure about yourself  0 0 0 0 0  Trouble concentrating 0 3 0 1 3  Moving slowly or fidgety/restless 0 1 0 0 0  Suicidal thoughts 0 0 0 0 0  PHQ-9 Score 0 16 4 10 10   Difficult doing work/chores Not difficult at all Extremely dIfficult  Very difficult Somewhat difficult   Hypertension: BP Readings from Last 3 Encounters:  02/20/24 118/70  11/21/23 126/78  11/02/23 124/84   Obesity: Wt Readings from Last 3 Encounters:  02/20/24 181 lb 14.4 oz (82.5 kg)  11/21/23 183 lb (83 kg)  11/02/23 182 lb 3.2 oz (82.6 kg)   BMI Readings from Last 3 Encounters:  02/20/24 29.36 kg/m  11/21/23 29.54 kg/m  11/02/23 29.41 kg/m     Vaccines: reviewed with the patient. Refused flu shot  Hep C Screening: completed STD testing and prevention (HIV/chl/gon/syphilis): we will check today  Intimate partner violence: negative screen  Sexual History : one partner, engaged  Menstrual History/LMP/Abnormal Bleeding: hysterectomy   Discussed importance of follow up if any post-menopausal bleeding: not applicable  Incontinence Symptoms: negative for symptoms   Breast cancer:  - Last Mammogram: up to date  - BRCA gene screening: family history of ovarian cancer, she had it done by gyn in the past   Osteoporosis Prevention : Discussed high calcium  and vitamin D  supplementation, weight bearing exercises Bone density :not applicable   Cervical cancer screening: up-to-date  Skin cancer: Discussed monitoring for atypical lesions  Colorectal cancer: start next year    Lung cancer:  Low Dose CT Chest recommended if Age 60-80 years, 20 pack-year currently smoking OR have quit w/in 15years. Patient does not qualify for screen   ECG: 2019  Advanced Care Planning: A voluntary discussion about advance care planning including the explanation and discussion of advance directives.  Discussed health care proxy and Living will, and the patient was able to identify a health care proxy as daughter .  Patient does not have a living will and power of attorney of health care   Patient Active Problem List   Diagnosis Date Noted   Psoriatic arthritis (HCC) 02/20/2024   Vision changes 05/21/2023   Numbness of right hand 05/21/2023   Increased risk of breast  cancer 09/12/2022   Right Achilles tendinitis 04/12/2022   GAD (generalized anxiety disorder) 04/12/2022   Chronic migraine without aura without status migrainosus, not intractable 05/09/2020   Family history of ovarian cancer 03/23/2020   History of hemorrhoidectomy    Protein C deficiency 12/10/2016   Monoclonal gammopathy of unknown significance (MGUS) 11/30/2016   Chronic deep vein thrombosis (DVT) of distal vein of left lower extremity (HCC) 09/17/2016   Vitamin D  deficiency 07/26/2015   Thyroid  cyst 07/26/2015   History of Helicobacter pylori infection 04/22/2015   Migraine with aura and without status migrainosus 04/22/2015   Shift work sleep disorder 04/22/2015     Past Surgical History:  Procedure Laterality Date   ABDOMINAL HYSTERECTOMY     ANTERIOR CRUCIATE LIGAMENT REPAIR Left    BREAST BIOPSY Left 10/07/2019   left breast calcs x clip, benign   CHOLECYSTECTOMY     CYSTOSCOPY  12/19/2016   Procedure: CYSTOSCOPY;  Surgeon: Arloa Lamar SQUIBB, MD;  Location: [facility] ORS;  Service: Gynecology;;   ESOPHAGOGASTRODUODENOSCOPY (EGD) WITH PROPOFOL  N/A 09/25/2017   Procedure: ESOPHAGOGASTRODUODENOSCOPY (EGD) WITH PROPOFOL ;  Surgeon: Therisa Bi, MD;  Location: [facility] ENDOSCOPY;  Service: Gastroenterology;  Laterality: N/A;   LAPAROSCOPIC HYSTERECTOMY Bilateral 12/19/2016   Procedure: HYSTERECTOMY TOTAL LAPAROSCOPIC BILATERAL SALPINGECTOMY;  Surgeon: Arloa Lamar SQUIBB, MD;  Location: [facility] ORS;  Service: Gynecology;  Laterality: Bilateral;   OTHER SURGICAL HISTORY     Knee surgery, 8/23 and 3/24   PLANTAR FASCIA SURGERY  06/2019   RECTAL EXAM UNDER ANESTHESIA N/A 02/26/2019   Procedure: RECTAL EXAM UNDER ANESTHESIA, EXCISION OF ANAL SKIN TAG;  Surgeon: Marolyn Nest, MD;  Location: [facility] ORS;  Service: General;  Laterality: N/A;   TUBAL LIGATION      Family History  Problem Relation Age of Onset   Cancer Mother        multiple myeloma   Hypertension Mother    Obesity Mother    Migraines Mother    Depression Mother    Heart failure Father    Cancer Father 71       lung cancer   Deep vein thrombosis Father    Protein C deficiency Father    COPD Father    Congestive Heart Failure Father    Varicose Veins Father    Cancer Sister        cervical   HIV Sister    Ovarian cancer Sister 4   Migraines Sister    Anxiety disorder Sister    Depression Sister    Diabetes Brother    Hypertension Maternal Grandmother    COPD Paternal Grandmother    Deep vein thrombosis Paternal Grandmother    Asthma Daughter    Protein C deficiency Daughter    Brain cancer Daughter    Cancer Daughter    Migraines Daughter    Early death Daughter    Protein C  deficiency Daughter    Migraines Daughter    Breast cancer Neg Hx     Social History   Socioeconomic History   Marital status: Divorced    Spouse name: Not on file   Number of children: 2   Years of education: 13   Highest education level: Some college, no degree  Occupational History   Occupation: Location manager   Tobacco Use   Smoking status: Former    Current packs/day: 0.00    Average packs/day: 0.3 packs/day for 6.0 years (1.6 ttl pk-yrs)    Types: Cigarettes    Start date: 05/15/2002  Quit date: 05/16/2007    Years since quitting: 16.7   Smokeless tobacco: Never  Vaping Use   Vaping status: Never Used  Substance and Sexual Activity   Alcohol use: Yes    Alcohol/week: 1.0 standard drink of alcohol    Types: 1 Glasses of wine per week    Comment: occ wine none last 24 hrs   Drug use: Not Currently   Sexual activity: Yes    Partners: Male    Birth control/protection: Surgical    Comment: hysterectomy   Other Topics Concern   Not on file  Social History Narrative   Separated since June 2016 - her youngest daughter 12 yo died of complications of glioblastoma, has an older daughter that works.     Engaged to [employer] Strain: Medium Risk (01/29/2024)   Received from [facility]   Overall Financial Resource Strain (CARDIA)    Difficulty of Paying Living Expenses: Somewhat hard  Food Insecurity: Food Insecurity Present (01/29/2024)   Received from [facility]   Hunger Vital Sign    Within the past 12 months, you worried that your food would run out before you got the money to buy more.: Sometimes true    Within the past 12 months, the food you bought just didn't last and you didn't have money to get more.: Sometimes true  Transportation Needs: No Transportation Needs (01/29/2024)   Received from [facility] - Transportation    In the past 12 months, has  lack of transportation kept you from medical appointments or from getting medications?: No    Lack of Transportation (Non-Medical): No  Physical Activity: Sufficiently Active (11/17/2023)   Exercise Vital Sign    Days of Exercise per Week: 5 days    Minutes of Exercise per Session: 30 min  Stress: Stress Concern Present (11/17/2023)   [employer] of Occupational Health - Occupational Stress Questionnaire    Feeling of Stress: Rather much  Social Connections: Moderately Isolated (11/17/2023)   Social Connection and Isolation Panel    Frequency of Communication with Friends and Family: More than three times a week    Frequency of Social Gatherings with Friends and Family: Once a week    Attends Religious Services: 1 to 4 times per year    Active Member of [employer] or Organizations: No    Attends Engineer, structural: Not on file    Marital Status: Divorced  Intimate Partner Violence: Not At Risk (02/20/2024)   Humiliation, Afraid, Rape, and Kick questionnaire    Fear of Current or Ex-Partner: No    Emotionally Abused: No    Physically Abused: No    Sexually Abused: No     Current Outpatient Medications:    clindamycin  (CLEOCIN  T) 1 % lotion, Apply topically every morning., Disp: , Rfl:    folic acid  (FOLVITE ) 1 MG tablet, Take 1 mg by mouth., Disp: , Rfl:    glycopyrrolate  (ROBINUL ) 1 MG tablet, Take 3 mg by mouth daily., Disp: , Rfl:    hydrOXYzine  (ATARAX ) 10 MG tablet, SMARTSIG:1 Pill By Mouth Every Night PRN, Disp: , Rfl:    methotrexate (RHEUMATREX) 2.5 MG tablet, Take 15 mg by mouth once a week., Disp: , Rfl:    NURTEC 75 MG TBDP, DISSOLVE 1 TABLET BY MOUTH DAILY AS NEEDED., Disp: 8 tablet, Rfl: 7   rivaroxaban  (XARELTO ) 20 MG TABS tablet, Take  1 tablet (20 mg total) by mouth daily., Disp: 90 tablet, Rfl: 3   tretinoin  (RETIN-A ) 0.025 % cream, Apply topically at bedtime., Disp: 45 g, Rfl: 0   Vitamin D , Ergocalciferol , (DRISDOL ) 1.25 MG (50000 UNIT) CAPS capsule, TAKE 1  CAPSULE (50,000 UNITS TOTAL) BY MOUTH EVERY 7 (SEVEN) DAYS, Disp: 12 capsule, Rfl: 0   traZODone  (DESYREL ) 100 MG tablet, Take 100 mg by mouth at bedtime., Disp: , Rfl:   Allergies  Allergen Reactions   Topamax [Topiramate] Other (See Comments)    Confusion , slurred speech    Effexor  [Venlafaxine ]     Gas and bloating      ROS  Constitutional: Negative for fever or weight change.  Respiratory: Negative for cough and shortness of breath.   Cardiovascular: Negative for chest pain, positive for intermittent palpitations.  Gastrointestinal: Negative for abdominal pain, no bowel changes.  Musculoskeletal: Negative for gait problem , she has joint pains, recently diagnosed with psoriatic arthritis  Skin: Negative for rash.  Neurological: Negative for dizziness , positive for intermittent migraine No other specific complaints in a complete review of systems (except as listed in HPI above).   Objective  Vitals:   02/20/24 1112  BP: 118/70  Pulse: 62  Resp: 18  Temp: 98.1 F (36.7 C)  SpO2: 98%  Weight: 181 lb 14.4 oz (82.5 kg)  Height: 5' 6 (1.676 m)    Body mass index is 29.36 kg/m.  Physical Exam  Constitutional: Patient appears well-developed and well-nourished. No distress.  HENT: Head: Normocephalic and atraumatic. Ears: B TMs ok, no erythema or effusion; Nose: Nose normal. Mouth/Throat: Oropharynx is clear and moist. No oropharyngeal exudate.  Eyes: Conjunctivae and EOM are normal. Pupils are equal, round, and reactive to light. No scleral icterus.  Neck: Normal range of motion. Neck supple. No JVD present. No thyromegaly present.  Cardiovascular: Normal rate, regular rhythm and normal heart sounds.  No murmur heard. No BLE edema. Pulmonary/Chest: Effort normal and breath sounds normal. No respiratory distress. Abdominal: Soft. Bowel sounds are normal, no distension. There is no tenderness. no masses Breast: no lumps or masses, no nipple discharge or rashes FEMALE  GENITALIA:  External genitalia normal Not done  RECTAL: not done  Musculoskeletal: Normal range of motion, no joint effusions. No gross deformities Neurological: he is alert and oriented to person, place, and time. No cranial nerve deficit. Coordination, balance, strength, speech and gait are normal.  Skin: Skin is warm and dry. No rash noted. No erythema.  Psychiatric: Patient has a normal mood and affect. behavior is normal. Judgment and thought content normal.     Assessment & Plan  1. Well adult exam (Primary)  - Hepatitis B Surface AntiBODY - Lipid panel - Hemoglobin A1c - HIV Antibody (routine testing w rflx) - RPR - Cervicovaginal ancillary only  2. Dyslipidemia  - Lipid panel  3. Diabetes mellitus screening  - Hemoglobin A1c  4. Need for hepatitis B screening test  - Hepatitis B Surface AntiBODY  5. Screening examination for STI  - HIV Antibody (routine testing w rflx) - RPR - Cervicovaginal ancillary only   -USPSTF grade A and B recommendations reviewed with patient; age-appropriate recommendations, preventive care, screening tests, etc discussed and encouraged; healthy living encouraged; see AVS for patient education given to patient -Discussed importance of 150 minutes of physical activity weekly, eat two servings of fish weekly, eat one serving of tree nuts ( cashews, pistachios, pecans, almonds.SABRA) every other day, eat 6 servings of fruit/vegetables  daily and drink plenty of water and avoid sweet beverages.   -Reviewed Health Maintenance: Yes.

## 2024-02-21 ENCOUNTER — Other Ambulatory Visit: Payer: Self-pay

## 2024-02-21 LAB — LIPID PANEL
Cholesterol: 184 mg/dL (ref <200)
HDL: 81 mg/dL (ref >=50)
LDL Cholesterol (Calc): 87 mg/dL
Non-HDL Cholesterol (Calc): 103 mg/dL (ref <130)
Total CHOL/HDL Ratio: 2.3 (calc) (ref <5.0)
Triglycerides: 70 mg/dL (ref <150)

## 2024-02-21 LAB — HIV ANTIBODY (ROUTINE TESTING W REFLEX)
HIV 1&2 Ab, 4th Generation: NONREACTIVE
HIV FINAL INTERPRETATION: NEGATIVE

## 2024-02-21 LAB — HEPATITIS B SURFACE ANTIBODY,QUALITATIVE: Hep B S Ab: NONREACTIVE

## 2024-02-21 LAB — HEMOGLOBIN A1C
Hgb A1c MFr Bld: 5.1 % (ref <5.7)
Mean Plasma Glucose: 100 mg/dL
eAG (mmol/L): 5.5 mmol/L

## 2024-02-21 LAB — RPR: RPR Ser Ql: NONREACTIVE

## 2024-02-22 ENCOUNTER — Ambulatory Visit: Payer: Self-pay | Admitting: Family Medicine

## 2024-02-22 LAB — CERVICOVAGINAL ANCILLARY ONLY
Bacterial Vaginitis (gardnerella): POSITIVE — AB
Candida Glabrata: NEGATIVE
Candida Vaginitis: NEGATIVE
Chlamydia: NEGATIVE
Comment: NEGATIVE
Comment: NEGATIVE
Comment: NEGATIVE
Comment: NEGATIVE
Comment: NEGATIVE
Comment: NORMAL
Neisseria Gonorrhea: NEGATIVE
Trichomonas: NEGATIVE

## 2024-02-25 ENCOUNTER — Encounter: Payer: Self-pay | Admitting: Family Medicine

## 2024-02-29 ENCOUNTER — Other Ambulatory Visit: Payer: Self-pay | Admitting: Family Medicine

## 2024-02-29 DIAGNOSIS — E559 Vitamin D deficiency, unspecified: Secondary | ICD-10-CM

## 2024-04-16 ENCOUNTER — Institutional Professional Consult (permissible substitution): Admitting: Neurology

## 2024-04-22 ENCOUNTER — Telehealth: Payer: Self-pay | Admitting: Adult Health

## 2024-05-23 ENCOUNTER — Encounter: Payer: Self-pay | Admitting: Family Medicine

## 2024-05-23 ENCOUNTER — Ambulatory Visit: Admitting: Family Medicine

## 2024-05-23 VITALS — BP 110/72 | HR 76 | Resp 16 | Ht 66.0 in | Wt 183.4 lb

## 2024-05-23 DIAGNOSIS — R61 Generalized hyperhidrosis: Secondary | ICD-10-CM

## 2024-05-23 DIAGNOSIS — Z23 Encounter for immunization: Secondary | ICD-10-CM | POA: Diagnosis not present

## 2024-05-23 DIAGNOSIS — E559 Vitamin D deficiency, unspecified: Secondary | ICD-10-CM

## 2024-05-23 DIAGNOSIS — D6859 Other primary thrombophilia: Secondary | ICD-10-CM | POA: Diagnosis not present

## 2024-05-23 DIAGNOSIS — F411 Generalized anxiety disorder: Secondary | ICD-10-CM

## 2024-05-23 DIAGNOSIS — L4050 Arthropathic psoriasis, unspecified: Secondary | ICD-10-CM | POA: Diagnosis not present

## 2024-05-23 DIAGNOSIS — I825Z2 Chronic embolism and thrombosis of unspecified deep veins of left distal lower extremity: Secondary | ICD-10-CM | POA: Diagnosis not present

## 2024-05-23 DIAGNOSIS — L309 Dermatitis, unspecified: Secondary | ICD-10-CM | POA: Diagnosis not present

## 2024-05-23 DIAGNOSIS — D472 Monoclonal gammopathy: Secondary | ICD-10-CM

## 2024-05-23 DIAGNOSIS — G43109 Migraine with aura, not intractable, without status migrainosus: Secondary | ICD-10-CM

## 2024-05-23 MED ORDER — VITAMIN D (ERGOCALCIFEROL) 1.25 MG (50000 UNIT) PO CAPS: 50000.0000 [IU] | ORAL_CAPSULE | ORAL | 1 refills | Status: AC

## 2024-05-23 MED ORDER — RIVAROXABAN 20 MG PO TABS: 20.0000 mg | ORAL_TABLET | Freq: Every day | ORAL | 3 refills | Status: AC

## 2024-05-23 MED ORDER — NURTEC 75 MG PO TBDP: 1.0000 | ORAL_TABLET | Freq: Every day | ORAL | 1 refills | Status: AC | PRN

## 2024-05-23 NOTE — Progress Notes (Signed)
 Name: Tammy Garza   MRN: [id number]    DOB: 1980/05/22   Date:05/23/2024       Progress Note  Subjective  Chief Complaint  Chief Complaint  Patient presents with   Medical Management of Chronic Issues   Discussed the use of AI scribe software for clinical note transcription with the patient, who gave verbal consent to proceed.  History of Present Illness Tammy Garza is a 44 year old female with undifferentiated inflammatory arthritis who presents for follow-up of her condition.  She has psoriatic arthritis. She discontinued methotrexate due to increased fatigue, lack of symptom relief, and brain fog. Prednisone is used during flare-ups but causes mood changes. Symptoms primarily affect her feet, Achilles, ankles, and left shoulder, with recent increased pain and stiffness in the left shoulder, particularly on the left side. She is right-hand dominant.  She has a history of monoclonal gammopathy of unknown significance but was released by her hematologist.  She experiences migraines with aura, managed with Nurtec, which she takes at the onset of a headache. She has not needed Ajovy  due to insurance issues and reports having about one migraine episode per month. She experiences visual aura and sometimes numbness in her left hand. She no longer sees a neurologist.  She has a history of a single DVT in the left lower leg and continues to take Xarelto  due to protein C deficiency, which increases her risk for thrombosis. She has not experienced any recent symptoms of DVT.  She takes trazodone  for sleep and vitamin D  once a month, as her levels were previously high. She has generalized anxiety disorder but reports improvement in her condition.  She has eczema, confirmed by a dermatologist, and uses topical treatments for hyperhidrosis.    Patient Active Problem List   Diagnosis Date Noted   Psoriatic arthritis (HCC) 02/20/2024   Vision changes 05/21/2023   Numbness of right hand 05/21/2023    Increased risk of breast cancer 09/12/2022   Right Achilles tendinitis 04/12/2022   GAD (generalized anxiety disorder) 04/12/2022   Chronic migraine without aura without status migrainosus, not intractable 05/09/2020   Family history of ovarian cancer 03/23/2020   History of hemorrhoidectomy    Protein C deficiency 12/10/2016   Monoclonal gammopathy of unknown significance (MGUS) 11/30/2016   Chronic deep vein thrombosis (DVT) of distal vein of left lower extremity (HCC) 09/17/2016   Vitamin D  deficiency 07/26/2015   Thyroid  cyst 07/26/2015   History of Helicobacter pylori infection 04/22/2015   Migraine with aura and without status migrainosus 04/22/2015   Shift work sleep disorder 04/22/2015    Past Surgical History:  Procedure Laterality Date   ABDOMINAL HYSTERECTOMY     ANTERIOR CRUCIATE LIGAMENT REPAIR Left    BREAST BIOPSY Left 10/07/2019   left breast calcs x clip, benign   CHOLECYSTECTOMY     CYSTOSCOPY  12/19/2016   Procedure: CYSTOSCOPY;  Surgeon: Arloa Lamar SQUIBB, MD;  Location: [facility] ORS;  Service: Gynecology;;   ESOPHAGOGASTRODUODENOSCOPY (EGD) WITH PROPOFOL  N/A 09/25/2017   Procedure: ESOPHAGOGASTRODUODENOSCOPY (EGD) WITH PROPOFOL ;  Surgeon: Therisa Bi, MD;  Location[facility] ENDOSCOPY;  Service: Gastroenterology;  Laterality: N/A;   LAPAROSCOPIC HYSTERECTOMY Bilateral 12/19/2016   Procedure: HYSTERECTOMY TOTAL LAPAROSCOPIC BILATERAL SALPINGECTOMY;  Surgeon: Arloa Lamar SQUIBB, MD;  Location: [facility] ORS;  Service: Gynecology;  Laterality: Bilateral;   OTHER SURGICAL HISTORY     Knee surgery, 8/23 and 3/24   PLANTAR FASCIA SURGERY  06/2019   RECTAL EXAM UNDER ANESTHESIA N/A 02/26/2019   Procedure: RECTAL  EXAM UNDER ANESTHESIA, EXCISION OF ANAL SKIN TAG;  Surgeon: Marolyn Nest, MD;  Location: [facility] ORS;  Service: General;  Laterality: N/A;   TUBAL LIGATION      Family History  Problem Relation Age of Onset   Cancer Mother        multiple myeloma   Hypertension  Mother    Obesity Mother    Migraines Mother    Depression Mother    Heart failure Father    Cancer Father 48       lung cancer   Deep vein thrombosis Father    Protein C deficiency Father    COPD Father    Congestive Heart Failure Father    Varicose Veins Father    Cancer Sister        cervical   HIV Sister    Ovarian cancer Sister 21   Migraines Sister    Anxiety disorder Sister    Depression Sister    Diabetes Brother    Hypertension Maternal Grandmother    COPD Paternal Grandmother    Deep vein thrombosis Paternal Grandmother    Asthma Daughter    Protein C deficiency Daughter    Brain cancer Daughter    Cancer Daughter    Migraines Daughter    Early death Daughter    Protein C deficiency Daughter    Migraines Daughter    Breast cancer Neg Hx     Social History   Tobacco Use   Smoking status: Former    Current packs/day: 0.00    Average packs/day: 0.3 packs/day for 6.0 years (1.6 ttl pk-yrs)    Types: Cigarettes    Start date: 05/15/2002    Quit date: 05/16/2007    Years since quitting: 17.0   Smokeless tobacco: Never  Substance Use Topics   Alcohol use: Yes    Alcohol/week: 1.0 standard drink of alcohol    Types: 1 Glasses of wine per week    Comment: occ wine none last 24 hrs    Current Medications[1]  Allergies[2]  I personally reviewed active problem list, medication list, allergies, family history with the patient/caregiver today.   ROS  Ten systems reviewed and is negative except as mentioned in HPI    Objective Physical Exam CONSTITUTIONAL: Patient appears well-developed and well-nourished.  No distress. HEENT: Head atraumatic, normocephalic, neck supple. CARDIOVASCULAR: Normal rate, regular rhythm and normal heart sounds.  No murmur heard. No BLE edema. PULMONARY: Effort normal and breath sounds normal. No respiratory distress. ABDOMINAL: There is no tenderness or distention. MUSCULOSKELETAL: Normal gait. Without gross motor or sensory  deficit. PSYCHIATRIC: Patient has a normal mood and affect. behavior is normal. Judgment and thought content normal.  Vitals:   05/23/24 1444  BP: 110/72  Pulse: 76  Resp: 16  SpO2: 99%  Weight: 183 lb 6.4 oz (83.2 kg)  Height: 5' 6 (1.676 m)    Body mass index is 29.6 kg/m.   PHQ2/9:    05/23/2024    2:39 PM 02/20/2024   11:12 AM 05/21/2023   10:52 AM 02/19/2023    2:05 PM 04/12/2022   10:25 AM  Depression screen PHQ 2/9  Decreased Interest 0 0 2 2 2   Down, Depressed, Hopeless 0 0 1 2 1   PHQ - 2 Score 0 0 3 4 3   Altered sleeping  0 3 0 2  Tired, decreased energy  0 3 0 3  Change in appetite  0 3 0 1  Feeling bad or failure about yourself  0 0 0 0  Trouble concentrating  0 3 0 1  Moving slowly or fidgety/restless  0 1 0 0  Suicidal thoughts  0 0 0 0  PHQ-9 Score  0  16  4  10    Difficult doing work/chores  Not difficult at all Extremely dIfficult  Very difficult     Data saved with a previous flowsheet row definition    phq 9 is negative  Fall Risk:    05/23/2024    2:39 PM 02/20/2024   11:11 AM 11/21/2023    9:24 AM 05/21/2023   10:53 AM 02/19/2023    2:05 PM  Fall Risk   Falls in the past year? 0 1 0 0 0  Number falls in past yr: 0 0 0 0 0  Injury with Fall? 0 0  0  0  0   Risk for fall due to : No Fall Risks    No Fall Risks  Follow up Falls evaluation completed Falls evaluation completed Falls evaluation completed  Falls prevention discussed     Data saved with a previous flowsheet row definition     Assessment & Plan Psoriatic arthritis Chronic psoriatic arthritis affecting feet, Achilles, ankles, and left shoulder. Methotrexate discontinued due to side effects. Prednisone used for flare-ups. Rheumatology follow-up scheduled. - Continue follow-up with rheumatologist on January 12th to discuss alternative treatments for psoriatic arthritis.  Chronic deep vein thrombosis of left lower extremity with protein C deficiency Chronic DVT in left lower extremity  managed with Xarelto . No current DVT symptoms. - Continue Xarelto  for anticoagulation. - Monitor for signs of bleeding, including in urine and stools.  Migraine with aura Chronic migraines with aura managed with Nurtec. Ajovy  not covered by insurance. No need for preventive medication. - Prescribed Nurtec for acute migraine management. - Provided a full refill of Nurtec.  Generalized anxiety disorder Symptoms improved. Managed with trazodone  for sleep. - Continue trazodone  for sleep.  Vitamin D  deficiency Vitamin D  levels normal with monthly supplementation. Prefers monthly dosing. - Continue monthly high-dose vitamin D  supplementation.  Eczema - Continue topical medications as prescribed by dermatologist.  Hyperhidrosis - Continue topical medication for hyperhidrosis.        [1]  Current Outpatient Medications:    clindamycin  (CLEOCIN  T) 1 % lotion, Apply topically every morning., Disp: , Rfl:    folic acid  (FOLVITE ) 1 MG tablet, Take 1 mg by mouth., Disp: , Rfl:    glycopyrrolate  (ROBINUL ) 1 MG tablet, Take 3 mg by mouth daily., Disp: , Rfl:    hydrOXYzine  (ATARAX ) 10 MG tablet, SMARTSIG:1 Pill By Mouth Every Night PRN, Disp: , Rfl:    NURTEC 75 MG TBDP, DISSOLVE 1 TABLET BY MOUTH DAILY AS NEEDED., Disp: 8 tablet, Rfl: 7   rivaroxaban  (XARELTO ) 20 MG TABS tablet, Take 1 tablet (20 mg total) by mouth daily., Disp: 90 tablet, Rfl: 3   traZODone  (DESYREL ) 100 MG tablet, Take 100 mg by mouth at bedtime., Disp: , Rfl:    tretinoin  (RETIN-A ) 0.025 % cream, Apply topically at bedtime., Disp: 45 g, Rfl: 0   Vitamin D , Ergocalciferol , (DRISDOL ) 1.25 MG (50000 UNIT) CAPS capsule, Take 1 capsule (50,000 Units total) by mouth every 14 (fourteen) days., Disp: 6 capsule, Rfl: 0 [2]  Allergies Allergen Reactions   Topamax [Topiramate] Other (See Comments)    Confusion , slurred speech    Effexor  [Venlafaxine ]     Gas and bloating

## 2024-05-26 ENCOUNTER — Other Ambulatory Visit: Payer: Self-pay

## 2024-05-26 ENCOUNTER — Telehealth: Payer: Self-pay

## 2024-05-26 DIAGNOSIS — Z1211 Encounter for screening for malignant neoplasm of colon: Secondary | ICD-10-CM

## 2024-05-26 DIAGNOSIS — L4050 Arthropathic psoriasis, unspecified: Secondary | ICD-10-CM

## 2024-05-26 MED ORDER — NA SULFATE-K SULFATE-MG SULF 17.5-3.13-1.6 GM/177ML PO SOLN: 1.0000 | Freq: Once | ORAL | 0 refills | Status: AC

## 2024-05-26 NOTE — Telephone Encounter (Signed)
 Gastroenterology Pre-Procedure Review  Request Date: 09/11/24 Requesting Physician: Dr. jinny  PATIENT REVIEW QUESTIONS: The patient responded to the following health history questions as indicated:    1. Are you having any GI issues? no 2. Do you have a personal history of Polyps? no 3. Do you have a family history of Colon Cancer or Polyps? no 4. Diabetes Mellitus? no 5. Joint replacements in the past 12 months?no 6. Major health problems in the past 3 months?no 7. Any artificial heart valves, MVP, or defibrillator?no    MEDICATIONS & ALLERGIES:    Patient reports the following regarding taking any anticoagulation/antiplatelet therapy:   Plavix, Coumadin, Eliquis , Xarelto , Lovenox, Pradaxa, Brilinta, or Effient? yes (xarelto  stop date pending pcp advice) Aspirin? no  Patient confirms/reports the following medications:  Current Outpatient Medications  Medication Sig Dispense Refill   clindamycin  (CLEOCIN  T) 1 % lotion Apply topically every morning.     glycopyrrolate  (ROBINUL ) 1 MG tablet Take 3 mg by mouth daily.     hydrOXYzine  (ATARAX ) 10 MG tablet SMARTSIG:1 Pill By Mouth Every Night PRN     Na Sulfate-K Sulfate-Mg Sulfate concentrate (SUPREP) 17.5-3.13-1.6 GM/177ML SOLN Take 1 kit (354 mLs total) by mouth once for 1 dose. 354 mL 0   traZODone  (DESYREL ) 100 MG tablet Take 100 mg by mouth at bedtime.     tretinoin  (RETIN-A ) 0.025 % cream Apply topically at bedtime. 45 g 0   NURTEC 75 MG TBDP Take 1 tablet (75 mg total) by mouth daily as needed. 15 tablet 1   rivaroxaban  (XARELTO ) 20 MG TABS tablet Take 1 tablet (20 mg total) by mouth daily. 90 tablet 3   Vitamin D , Ergocalciferol , (DRISDOL ) 1.25 MG (50000 UNIT) CAPS capsule Take 1 capsule (50,000 Units total) by mouth every 14 (fourteen) days. 6 capsule 1   No current facility-administered medications for this visit.    Patient confirms/reports the following allergies:  Allergies[1]  No orders of the defined types were  placed in this encounter.   AUTHORIZATION INFORMATION Primary Insurance: 1D#: Group #:  Secondary Insurance: 1D#: Group #:  SCHEDULE INFORMATION: Date: 09/11/24 Time: Location: [facility]    [1]  Allergies Allergen Reactions   Topamax [Topiramate] Other (See Comments)    Confusion , slurred speech    Venlafaxine  Other (See Comments)    Gas and bloating

## 2024-05-26 NOTE — Telephone Encounter (Signed)
 Copied from CRM #[id number]. Topic: Referral - Request for Referral >> May 26, 2024  3:53 PM Delon DASEN wrote: Did the patient discuss referral with their provider in the last year? Yes (If No - schedule appointment) (If Yes - send message)  Appointment offered? No- just had appt  Type of order/referral and detailed reason for visit: rheumatologist  Preference of office, provider, location: [facility] Dr Leeroy Botts  If referral order, have you been seen by this specialty before? Yes (If Yes, this issue or another issue? When? Where? Dr Elsie Common  Can we respond through MyChart? Yes

## 2024-05-26 NOTE — Telephone Encounter (Signed)
 Per pt message return AGI  call to scheduled appt for procedure.

## 2024-05-28 ENCOUNTER — Ambulatory Visit: Payer: Self-pay

## 2024-05-28 NOTE — Telephone Encounter (Signed)
 FYI Only or Action Required?: Action required by provider: clinical question for provider.  Patient was last seen in primary care on 05/23/2024 by Glenard Mire, MD.  Called Nurse Triage reporting Advice Only.  Symptoms began today.  Interventions attempted: Nothing.  Symptoms are: stable.  Triage Disposition: Call PCP When Office is Open  Patient/caregiver understands and will follow disposition?: Yes  Reason for Disposition  [1] Caller requesting NON-URGENT health information AND [2] PCP's office is the best resource  Answer Assessment - Initial Assessment Questions 1. REASON FOR CALL: What is the main reason for your call? or How can I best help you?     Patient states that she received Hep B vaccine last Friday and and after seeing her rheumatologist they informed her that she tested positive for Hep B. Patient would like to know if this is due to vaccine.   2. SYMPTOMS : Do you have any symptoms?      Denies any current symptoms  3. OTHER QUESTIONS: Do you have any other questions?     Is it contagious?  Protocols used: Information Only Call - No Triage-A-AH  Copied from CRM [id number]. Topic: Clinical - Medical Advice >> May 28, 2024  4:24 PM Aisha D wrote: Reason for CRM: Pt stated that she got a Hep B vaccine last Friday and recently seen her rheumatologist and they informed her that she tested positive for Hep B. Pt wants to know if that is due to her getting the vaccine last week.

## 2024-05-29 ENCOUNTER — Other Ambulatory Visit (HOSPITAL_COMMUNITY): Payer: Self-pay

## 2024-05-29 NOTE — Telephone Encounter (Signed)
 FYI please  read

## 2024-05-30 NOTE — Telephone Encounter (Signed)
 Medication advice has been received from Dr. Glenard for patients colonoscopy scheduled on 09/11/24.  Dr. Sowles has advised for patient to stop Xarelto  2 days prior to procedure and restart 1 day after procedure.  Patient has been advised.  A copy of this letter will be mailed to her and a reminder message will be post dated for 09/06/24.  Thanks,  [facility], CMA

## 2024-06-02 ENCOUNTER — Other Ambulatory Visit: Payer: Self-pay | Admitting: Family Medicine

## 2024-06-02 DIAGNOSIS — R7689 Other specified abnormal immunological findings in serum: Secondary | ICD-10-CM

## 2024-06-04 ENCOUNTER — Encounter: Payer: Self-pay | Admitting: Family Medicine

## 2024-06-04 ENCOUNTER — Ambulatory Visit: Payer: Self-pay | Admitting: Family Medicine

## 2024-06-06 ENCOUNTER — Other Ambulatory Visit (HOSPITAL_COMMUNITY)
Admission: RE | Admit: 2024-06-06 | Discharge: 2024-06-06 | Disposition: A | Source: Ambulatory Visit | Attending: Nurse Practitioner | Admitting: Nurse Practitioner

## 2024-06-06 ENCOUNTER — Encounter: Payer: Self-pay | Admitting: Nurse Practitioner

## 2024-06-06 ENCOUNTER — Ambulatory Visit (INDEPENDENT_AMBULATORY_CARE_PROVIDER_SITE_OTHER): Admitting: Nurse Practitioner

## 2024-06-06 VITALS — BP 130/82 | HR 78 | Temp 98.0°F | Ht 66.0 in | Wt 183.0 lb

## 2024-06-06 DIAGNOSIS — N760 Acute vaginitis: Secondary | ICD-10-CM | POA: Diagnosis present

## 2024-06-06 DIAGNOSIS — B9689 Other specified bacterial agents as the cause of diseases classified elsewhere: Secondary | ICD-10-CM

## 2024-06-06 LAB — HEPATITIS B CORE ANTIBODY, TOTAL, WITH REFLEX TO IGM: Hep B Core Total Ab: NONREACTIVE

## 2024-06-06 LAB — HEPATITIS B DNA, ULTRAQUANTITATIVE, PCR
Hepatitis B DNA: NOT DETECTED [IU]/mL
Hepatitis B virus DNA: NOT DETECTED {Log_IU}/mL

## 2024-06-06 MED ORDER — METRONIDAZOLE 500 MG PO TABS: 500.0000 mg | ORAL_TABLET | Freq: Two times a day (BID) | ORAL | 0 refills | Status: AC

## 2024-06-06 NOTE — Progress Notes (Signed)
 "  BP 130/82   Pulse 78   Temp 98 F (36.7 C)   Ht 5' 6 (1.676 m)   Wt 183 lb (83 kg)   LMP 01/04/2016 Comment: partial hysterectomy, tubal   SpO2 97%   BMI 29.54 kg/m    Subjective:    Patient ID: Tammy Garza, female    DOB: 06/16/1980, 44 y.o.   MRN: [id number]  HPI: Tammy Garza is a 44 y.o. female  Chief Complaint  Patient presents with   Vaginal Discharge    Pt c/o vaginal discharge x1 week.    Discussed the use of AI scribe software for clinical note transcription with the patient, who gave verbal consent to proceed.  History of Present Illness Tammy Garza is a 44 year old female who presents with vaginal discharge.  Vaginal discharge - Vaginal discharge present for the past week -reports symptoms are worse than last time -vaginal discharge - No new sexual partners  History of bacterial vaginosis - Diagnosed in October 2025 via positive vaginal swab - Not treated at that time due to mild symptoms         05/23/2024    2:39 PM 02/20/2024   11:12 AM 05/21/2023   10:52 AM  Depression screen PHQ 2/9  Decreased Interest 0 0 2  Down, Depressed, Hopeless 0 0 1  PHQ - 2 Score 0 0 3  Altered sleeping  0 3  Tired, decreased energy  0 3  Change in appetite  0 3  Feeling bad or failure about yourself   0 0  Trouble concentrating  0 3  Moving slowly or fidgety/restless  0 1  Suicidal thoughts  0 0  PHQ-9 Score  0  16   Difficult doing work/chores  Not difficult at all Extremely dIfficult     Data saved with a previous flowsheet row definition    Relevant past medical, surgical, family and social history reviewed and updated as indicated. Interim medical history since our last visit reviewed. Allergies and medications reviewed and updated.  Review of Systems Ten systems reviewed and is negative except as mentioned in HPI      Objective:      BP 130/82   Pulse 78   Temp 98 F (36.7 C)   Ht 5' 6 (1.676 m)   Wt 183 lb (83 kg)   LMP 01/04/2016  Comment: partial hysterectomy, tubal   SpO2 97%   BMI 29.54 kg/m    Wt Readings from Last 3 Encounters:  06/06/24 183 lb (83 kg)  05/23/24 183 lb 6.4 oz (83.2 kg)  02/20/24 181 lb 14.4 oz (82.5 kg)    Physical Exam GENERAL: Alert, cooperative, well developed, no acute distress HEENT: Normocephalic, normal oropharynx, moist mucous membranes CHEST: Clear to auscultation bilaterally, No wheezes, rhonchi, or crackles CARDIOVASCULAR: Normal heart rate and rhythm, S1 and S2 normal without murmurs ABDOMEN: Soft, non-tender, non-distended, without organomegaly, Normal bowel sounds EXTREMITIES: No cyanosis or edema NEUROLOGICAL: Cranial nerves grossly intact, Moves all extremities without gross motor or sensory deficit  Results for orders placed or performed in visit on 06/02/24  Hepatitis B DNA, ultraquantitative, PCR   Collection Time: 06/03/24  8:31 AM  Result Value Ref Range   Hepatitis B DNA NOT DETECTED NOT DETECTED IU/mL   Hepatitis B virus DNA NOT DETECTED NOT DETECTED Log IU/mL  Hepatitis B Core Antibody, Total, with Reflex to IgM   Collection Time: 06/03/24  8:31 AM  Result Value Ref Range   Hep  B Core Total Ab NON-REACTIVE NON-REACTIVE          Assessment & Plan:   Problem List Items Addressed This Visit   None Visit Diagnoses       BV (bacterial vaginosis)    -  Primary   Relevant Medications   metroNIDAZOLE  (FLAGYL ) 500 MG tablet   Other Relevant Orders   Cervicovaginal ancillary only        Assessment and Plan Assessment & Plan Acute vaginitis Vaginal discharge for one week. Previous bacterial vaginosis (BV) diagnosed in October 2025, untreated due to mild symptoms. Current symptoms suggestive of BV recurrence, but differential diagnosis includes other causes of vaginitis. Decision to initiate treatment with Flagyl  due to potential weather-related delays in obtaining test results. - Prescribed Flagyl  (metronidazole ) 500 mg orally twice daily. - Advised to  avoid alcohol and sun exposure while on Flagyl  due to risk of disulfiram-like reaction and photosensitivity. - Instructed to eat with medication to prevent stomach upset. - Await results of vaginal swab to confirm diagnosis and adjust treatment if necessary.        Follow up plan: Return if symptoms worsen or fail to improve. "

## 2024-06-11 LAB — CERVICOVAGINAL ANCILLARY ONLY
Bacterial Vaginitis (gardnerella): POSITIVE — AB
Candida Glabrata: NEGATIVE
Candida Vaginitis: POSITIVE — AB
Chlamydia: NEGATIVE
Comment: NEGATIVE
Comment: NEGATIVE
Comment: NEGATIVE
Comment: NEGATIVE
Comment: NEGATIVE
Comment: NORMAL
Neisseria Gonorrhea: NEGATIVE
Trichomonas: NEGATIVE

## 2024-06-12 ENCOUNTER — Ambulatory Visit: Payer: Self-pay | Admitting: Nurse Practitioner

## 2024-06-12 DIAGNOSIS — B379 Candidiasis, unspecified: Secondary | ICD-10-CM

## 2024-06-12 MED ORDER — FLUCONAZOLE 150 MG PO TABS: 150.0000 mg | ORAL_TABLET | ORAL | 0 refills | Status: AC | PRN

## 2024-07-22 ENCOUNTER — Ambulatory Visit

## 2024-09-11 ENCOUNTER — Ambulatory Visit: Admit: 2024-09-11 | Admitting: Gastroenterology

## 2024-09-11 SURGERY — COLONOSCOPY
Anesthesia: General

## 2024-11-21 ENCOUNTER — Ambulatory Visit: Admitting: Family Medicine

## 2025-02-20 ENCOUNTER — Encounter: Admitting: Family Medicine
